# Patient Record
Sex: Female | Born: 1935
Health system: Southern US, Community
[De-identification: ages and names within clinical notes are randomized; demographics above are authoritative.]

## PROBLEM LIST (undated history)

## (undated) DIAGNOSIS — K219 Gastro-esophageal reflux disease without esophagitis: Secondary | ICD-10-CM

## (undated) DIAGNOSIS — M8580 Other specified disorders of bone density and structure, unspecified site: Secondary | ICD-10-CM

## (undated) DIAGNOSIS — G479 Sleep disorder, unspecified: Secondary | ICD-10-CM

## (undated) DIAGNOSIS — E782 Mixed hyperlipidemia: Secondary | ICD-10-CM

## (undated) DIAGNOSIS — I4891 Unspecified atrial fibrillation: Secondary | ICD-10-CM

## (undated) DIAGNOSIS — I480 Paroxysmal atrial fibrillation: Secondary | ICD-10-CM

## (undated) DIAGNOSIS — I5189 Other ill-defined heart diseases: Secondary | ICD-10-CM

## (undated) DIAGNOSIS — E079 Disorder of thyroid, unspecified: Secondary | ICD-10-CM

## (undated) DIAGNOSIS — T7840XA Allergy, unspecified, initial encounter: Secondary | ICD-10-CM

## (undated) DIAGNOSIS — I10 Essential (primary) hypertension: Secondary | ICD-10-CM

## (undated) DIAGNOSIS — E785 Hyperlipidemia, unspecified: Secondary | ICD-10-CM

## (undated) HISTORY — PX: CHOLECYSTECTOMY: SHX55

## (undated) HISTORY — DX: Gastro-esophageal reflux disease without esophagitis: K21.9

## (undated) HISTORY — DX: Allergy, unspecified, initial encounter: T78.40XA

## (undated) HISTORY — DX: Essential (primary) hypertension: I10

## (undated) HISTORY — DX: Hyperlipidemia, unspecified: E78.5

## (undated) HISTORY — DX: Mixed hyperlipidemia: E78.2

## (undated) HISTORY — DX: Other specified disorders of bone density and structure, unspecified site: M85.80

## (undated) HISTORY — DX: Sleep disorder, unspecified: G47.9

## (undated) HISTORY — DX: Disorder of thyroid, unspecified: E07.9

## (undated) HISTORY — DX: Unspecified atrial fibrillation: I48.91

## (undated) HISTORY — DX: Paroxysmal atrial fibrillation: I48.0

## (undated) HISTORY — DX: Other ill-defined heart diseases: I51.89

## (undated) HISTORY — PX: STRABISMUS SURGERY: SHX218

## (undated) HISTORY — PX: CARPAL TUNNEL RELEASE: SHX101

---

## 1959-07-16 HISTORY — PX: CERVICAL CONE BIOPSY: SUR198

## 1999-11-15 HISTORY — PX: NASAL POLYP SURGERY: SHX186

## 2002-01-12 ENCOUNTER — Encounter: Payer: Self-pay | Admitting: Internal Medicine

## 2002-01-12 LAB — CONVERTED CEMR LAB: Pap Smear: NORMAL

## 2002-01-15 ENCOUNTER — Other Ambulatory Visit: Admission: RE | Admit: 2002-01-15 | Discharge: 2002-01-15 | Payer: Self-pay | Admitting: Internal Medicine

## 2002-11-27 ENCOUNTER — Encounter: Payer: Self-pay | Admitting: Internal Medicine

## 2003-04-02 ENCOUNTER — Encounter: Payer: Self-pay | Admitting: Internal Medicine

## 2003-04-02 LAB — HM COLONOSCOPY: HM Colonoscopy: NORMAL

## 2004-10-04 ENCOUNTER — Ambulatory Visit: Payer: Self-pay | Admitting: Internal Medicine

## 2004-10-06 ENCOUNTER — Ambulatory Visit: Payer: Self-pay | Admitting: Internal Medicine

## 2004-10-11 ENCOUNTER — Ambulatory Visit: Payer: Self-pay | Admitting: Internal Medicine

## 2004-10-18 ENCOUNTER — Ambulatory Visit: Payer: Self-pay | Admitting: Internal Medicine

## 2004-10-27 ENCOUNTER — Ambulatory Visit: Payer: Self-pay | Admitting: Internal Medicine

## 2004-11-11 ENCOUNTER — Ambulatory Visit: Payer: Self-pay

## 2004-12-14 ENCOUNTER — Ambulatory Visit: Payer: Self-pay | Admitting: Internal Medicine

## 2005-01-07 ENCOUNTER — Ambulatory Visit: Payer: Self-pay | Admitting: Internal Medicine

## 2005-02-10 ENCOUNTER — Ambulatory Visit: Payer: Self-pay | Admitting: Internal Medicine

## 2005-07-28 ENCOUNTER — Ambulatory Visit: Payer: Self-pay | Admitting: Internal Medicine

## 2005-08-17 ENCOUNTER — Ambulatory Visit: Payer: Self-pay | Admitting: Internal Medicine

## 2005-08-24 ENCOUNTER — Ambulatory Visit: Payer: Self-pay | Admitting: Internal Medicine

## 2005-10-07 ENCOUNTER — Ambulatory Visit: Payer: Self-pay | Admitting: Family Medicine

## 2005-10-24 ENCOUNTER — Ambulatory Visit: Payer: Self-pay | Admitting: Internal Medicine

## 2005-11-23 ENCOUNTER — Ambulatory Visit: Payer: Self-pay | Admitting: Internal Medicine

## 2005-12-21 ENCOUNTER — Ambulatory Visit: Payer: Self-pay | Admitting: Internal Medicine

## 2006-01-16 ENCOUNTER — Ambulatory Visit: Payer: Self-pay | Admitting: Internal Medicine

## 2006-01-25 ENCOUNTER — Ambulatory Visit: Payer: Self-pay | Admitting: Internal Medicine

## 2006-02-24 ENCOUNTER — Ambulatory Visit: Payer: Self-pay | Admitting: General Surgery

## 2006-03-13 ENCOUNTER — Ambulatory Visit: Payer: Self-pay | Admitting: Internal Medicine

## 2006-04-03 ENCOUNTER — Ambulatory Visit: Payer: Self-pay | Admitting: Internal Medicine

## 2006-08-08 ENCOUNTER — Ambulatory Visit: Payer: Self-pay | Admitting: Internal Medicine

## 2006-08-16 ENCOUNTER — Ambulatory Visit: Payer: Self-pay | Admitting: Internal Medicine

## 2006-08-29 ENCOUNTER — Ambulatory Visit: Payer: Self-pay | Admitting: Internal Medicine

## 2006-09-14 ENCOUNTER — Ambulatory Visit: Payer: Self-pay | Admitting: Internal Medicine

## 2006-09-28 ENCOUNTER — Ambulatory Visit: Payer: Self-pay | Admitting: Internal Medicine

## 2006-10-31 ENCOUNTER — Ambulatory Visit: Payer: Self-pay | Admitting: Internal Medicine

## 2006-11-21 ENCOUNTER — Ambulatory Visit: Payer: Self-pay | Admitting: Internal Medicine

## 2006-11-23 ENCOUNTER — Encounter: Payer: Self-pay | Admitting: Internal Medicine

## 2006-12-08 ENCOUNTER — Ambulatory Visit: Payer: Self-pay | Admitting: Internal Medicine

## 2006-12-08 LAB — CONVERTED CEMR LAB: Hgb A1c MFr Bld: 8.1 % — ABNORMAL HIGH (ref 4.6–6.0)

## 2006-12-12 ENCOUNTER — Ambulatory Visit: Payer: Self-pay | Admitting: Internal Medicine

## 2006-12-21 ENCOUNTER — Ambulatory Visit: Payer: Self-pay | Admitting: Internal Medicine

## 2006-12-26 ENCOUNTER — Ambulatory Visit: Payer: Self-pay | Admitting: Internal Medicine

## 2006-12-26 ENCOUNTER — Encounter: Payer: Self-pay | Admitting: Internal Medicine

## 2007-01-12 ENCOUNTER — Encounter: Payer: Self-pay | Admitting: Internal Medicine

## 2007-01-12 DIAGNOSIS — J309 Allergic rhinitis, unspecified: Secondary | ICD-10-CM | POA: Insufficient documentation

## 2007-01-12 DIAGNOSIS — I480 Paroxysmal atrial fibrillation: Secondary | ICD-10-CM

## 2007-01-12 DIAGNOSIS — M949 Disorder of cartilage, unspecified: Secondary | ICD-10-CM

## 2007-01-12 DIAGNOSIS — E039 Hypothyroidism, unspecified: Secondary | ICD-10-CM | POA: Insufficient documentation

## 2007-01-12 DIAGNOSIS — M899 Disorder of bone, unspecified: Secondary | ICD-10-CM | POA: Insufficient documentation

## 2007-01-12 DIAGNOSIS — E782 Mixed hyperlipidemia: Secondary | ICD-10-CM | POA: Insufficient documentation

## 2007-01-12 DIAGNOSIS — K219 Gastro-esophageal reflux disease without esophagitis: Secondary | ICD-10-CM | POA: Insufficient documentation

## 2007-02-19 ENCOUNTER — Ambulatory Visit: Payer: Self-pay | Admitting: Internal Medicine

## 2007-02-23 ENCOUNTER — Ambulatory Visit: Payer: Self-pay | Admitting: Internal Medicine

## 2007-04-18 ENCOUNTER — Encounter (INDEPENDENT_AMBULATORY_CARE_PROVIDER_SITE_OTHER): Payer: Self-pay | Admitting: *Deleted

## 2007-04-19 ENCOUNTER — Ambulatory Visit: Payer: Self-pay | Admitting: Internal Medicine

## 2007-04-20 LAB — CONVERTED CEMR LAB
CO2: 29 meq/L (ref 19–32)
Calcium: 9.7 mg/dL (ref 8.4–10.5)
Chloride: 105 meq/L (ref 96–112)
Cholesterol: 184 mg/dL (ref 0–200)
Creatinine, Ser: 0.7 mg/dL (ref 0.4–1.2)
Free T4: 1 ng/dL (ref 0.6–1.6)
GFR calc Af Amer: 105 mL/min
Glucose, Bld: 187 mg/dL — ABNORMAL HIGH (ref 70–99)
HDL: 39.7 mg/dL (ref >39.0)
Phosphorus: 4.4 mg/dL (ref 2.3–4.6)
Potassium: 4.6 meq/L (ref 3.5–5.1)
Sodium: 140 meq/L (ref 135–145)
TSH: 3.62 microintl units/mL (ref 0.35–5.50)

## 2007-06-08 ENCOUNTER — Telehealth: Payer: Self-pay | Admitting: Internal Medicine

## 2007-07-24 ENCOUNTER — Ambulatory Visit: Payer: Self-pay | Admitting: Internal Medicine

## 2007-08-13 ENCOUNTER — Telehealth (INDEPENDENT_AMBULATORY_CARE_PROVIDER_SITE_OTHER): Payer: Self-pay | Admitting: *Deleted

## 2007-08-13 ENCOUNTER — Emergency Department: Payer: Self-pay | Admitting: Emergency Medicine

## 2007-08-13 ENCOUNTER — Ambulatory Visit: Payer: Self-pay | Admitting: Internal Medicine

## 2007-08-14 ENCOUNTER — Emergency Department: Payer: Self-pay | Admitting: Emergency Medicine

## 2007-08-14 ENCOUNTER — Other Ambulatory Visit: Payer: Self-pay

## 2007-08-16 ENCOUNTER — Ambulatory Visit: Payer: Self-pay | Admitting: Internal Medicine

## 2007-08-21 ENCOUNTER — Encounter: Payer: Self-pay | Admitting: Internal Medicine

## 2007-08-21 ENCOUNTER — Ambulatory Visit: Payer: Self-pay

## 2007-08-22 ENCOUNTER — Ambulatory Visit: Payer: Self-pay | Admitting: Emergency Medicine

## 2007-08-24 ENCOUNTER — Ambulatory Visit: Payer: Self-pay | Admitting: Internal Medicine

## 2007-09-05 ENCOUNTER — Ambulatory Visit: Payer: Self-pay | Admitting: Internal Medicine

## 2007-09-06 LAB — CONVERTED CEMR LAB: Hgb A1c MFr Bld: 7.5 % — ABNORMAL HIGH (ref 4.6–6.0)

## 2007-09-18 ENCOUNTER — Encounter (INDEPENDENT_AMBULATORY_CARE_PROVIDER_SITE_OTHER): Payer: Self-pay | Admitting: *Deleted

## 2007-11-22 ENCOUNTER — Ambulatory Visit: Payer: Self-pay | Admitting: Internal Medicine

## 2007-11-28 ENCOUNTER — Ambulatory Visit: Payer: Self-pay | Admitting: Internal Medicine

## 2007-11-28 LAB — CONVERTED CEMR LAB: Rapid Strep: NEGATIVE

## 2007-12-11 ENCOUNTER — Ambulatory Visit: Payer: Self-pay | Admitting: Internal Medicine

## 2008-01-01 ENCOUNTER — Ambulatory Visit: Payer: Self-pay | Admitting: Internal Medicine

## 2008-01-30 ENCOUNTER — Ambulatory Visit: Payer: Self-pay | Admitting: Internal Medicine

## 2008-01-30 ENCOUNTER — Encounter: Payer: Self-pay | Admitting: Internal Medicine

## 2008-02-04 ENCOUNTER — Ambulatory Visit: Payer: Self-pay | Admitting: Internal Medicine

## 2008-02-06 ENCOUNTER — Ambulatory Visit: Payer: Self-pay | Admitting: Internal Medicine

## 2008-02-06 DIAGNOSIS — I10 Essential (primary) hypertension: Secondary | ICD-10-CM

## 2008-02-07 LAB — CONVERTED CEMR LAB
ALT: 71 units/L — ABNORMAL HIGH (ref 0–35)
Basophils Relative: 1.1 % — ABNORMAL HIGH (ref 0.0–1.0)
Bilirubin, Direct: 0.1 mg/dL (ref 0.0–0.3)
Chloride: 104 meq/L (ref 96–112)
Cholesterol: 173 mg/dL (ref 0–200)
Creatinine, Ser: 0.7 mg/dL (ref 0.4–1.2)
Digitoxin Lvl: 0.4 ng/mL — ABNORMAL LOW (ref 0.8–2.0)
Direct LDL: 82.7 mg/dL
GFR calc Af Amer: 105 mL/min
Glucose, Bld: 189 mg/dL — ABNORMAL HIGH (ref 70–99)
Hgb A1c MFr Bld: 7.9 % — ABNORMAL HIGH (ref 4.6–6.0)
Lymphocytes Relative: 33.3 % (ref 12.0–46.0)
MCHC: 33.8 g/dL (ref 30.0–36.0)
Monocytes Absolute: 0.8 10*3/uL — ABNORMAL HIGH (ref 0.2–0.7)
Neutrophils Relative %: 44.2 % (ref 43.0–77.0)
Potassium: 4.7 meq/L (ref 3.5–5.1)
Sodium: 138 meq/L (ref 135–145)
TSH: 5.88 microintl units/mL — ABNORMAL HIGH (ref 0.35–5.50)
Total CHOL/HDL Ratio: 5.2
Total Protein: 6.4 g/dL (ref 6.0–8.3)
VLDL: 66 mg/dL — ABNORMAL HIGH (ref 0–40)
WBC: 7.3 10*3/uL (ref 4.5–10.5)

## 2008-02-26 ENCOUNTER — Encounter: Payer: Self-pay | Admitting: Internal Medicine

## 2008-03-03 ENCOUNTER — Encounter (INDEPENDENT_AMBULATORY_CARE_PROVIDER_SITE_OTHER): Payer: Self-pay | Admitting: *Deleted

## 2008-03-03 LAB — HM MAMMOGRAPHY: HM Mammogram: NORMAL

## 2008-03-17 ENCOUNTER — Ambulatory Visit: Payer: Self-pay | Admitting: Internal Medicine

## 2008-03-19 LAB — CONVERTED CEMR LAB
ALT: 64 units/L — ABNORMAL HIGH (ref 0–35)
AST: 43 units/L — ABNORMAL HIGH (ref 0–37)
Albumin: 3.8 g/dL (ref 3.5–5.2)
Alkaline Phosphatase: 83 units/L (ref 39–117)
HCV Ab: NEGATIVE

## 2008-06-18 ENCOUNTER — Ambulatory Visit: Payer: Self-pay | Admitting: Internal Medicine

## 2008-06-18 DIAGNOSIS — L570 Actinic keratosis: Secondary | ICD-10-CM | POA: Insufficient documentation

## 2008-06-19 LAB — CONVERTED CEMR LAB
Basophils Absolute: 0.1 10*3/uL (ref 0.0–0.1)
Calcium: 9.4 mg/dL (ref 8.4–10.5)
Chloride: 100 meq/L (ref 96–112)
Direct LDL: 102.8 mg/dL
GFR calc Af Amer: 90 mL/min
GFR calc non Af Amer: 74 mL/min
HCT: 40 % (ref 36.0–46.0)
HDL: 35.8 mg/dL — ABNORMAL LOW (ref >39.0)
Hemoglobin: 14 g/dL (ref 12.0–15.0)
MCHC: 35.1 g/dL (ref 30.0–36.0)
MCV: 90.2 fL (ref 78.0–100.0)
Monocytes Absolute: 0.8 10*3/uL (ref 0.1–1.0)
Monocytes Relative: 11.5 % (ref 3.0–12.0)
Phosphorus: 4.1 mg/dL (ref 2.3–4.6)
Platelets: 206 10*3/uL (ref 150–400)
Potassium: 4.6 meq/L (ref 3.5–5.1)
RBC: 4.43 M/uL (ref 3.87–5.11)
RDW: 11.5 % (ref 11.5–14.6)
Sodium: 135 meq/L (ref 135–145)
Total Bilirubin: 0.8 mg/dL (ref 0.3–1.2)
Total CHOL/HDL Ratio: 5.8
Triglycerides: 430 mg/dL (ref 0–149)
VLDL: 86 mg/dL — ABNORMAL HIGH (ref 0–40)

## 2008-07-16 ENCOUNTER — Ambulatory Visit: Payer: Self-pay | Admitting: Internal Medicine

## 2008-08-07 ENCOUNTER — Ambulatory Visit: Payer: Self-pay | Admitting: Internal Medicine

## 2008-08-14 ENCOUNTER — Encounter: Payer: Self-pay | Admitting: Internal Medicine

## 2008-09-01 ENCOUNTER — Telehealth: Payer: Self-pay | Admitting: Internal Medicine

## 2008-09-03 ENCOUNTER — Encounter: Payer: Self-pay | Admitting: Internal Medicine

## 2008-11-12 ENCOUNTER — Ambulatory Visit: Payer: Self-pay | Admitting: Internal Medicine

## 2008-11-12 DIAGNOSIS — G479 Sleep disorder, unspecified: Secondary | ICD-10-CM | POA: Insufficient documentation

## 2008-11-13 LAB — CONVERTED CEMR LAB: Hgb A1c MFr Bld: 7.5 % — ABNORMAL HIGH (ref 4.6–6.0)

## 2009-02-02 ENCOUNTER — Telehealth: Payer: Self-pay | Admitting: Internal Medicine

## 2009-02-10 ENCOUNTER — Telehealth: Payer: Self-pay | Admitting: Internal Medicine

## 2009-03-16 ENCOUNTER — Ambulatory Visit: Payer: Self-pay | Admitting: Internal Medicine

## 2009-03-17 ENCOUNTER — Ambulatory Visit: Payer: Self-pay | Admitting: Internal Medicine

## 2009-03-17 ENCOUNTER — Encounter: Payer: Self-pay | Admitting: Internal Medicine

## 2009-03-17 LAB — CONVERTED CEMR LAB
BUN: 11 mg/dL (ref 6–23)
Basophils Absolute: 0 10*3/uL (ref 0.0–0.1)
Bilirubin, Direct: 0 mg/dL (ref 0.0–0.3)
CO2: 28 meq/L (ref 19–32)
Calcium: 9.6 mg/dL (ref 8.4–10.5)
Digitoxin Lvl: 0.4 ng/mL — ABNORMAL LOW (ref 0.8–2.0)
Free T4: 1.2 ng/dL (ref 0.6–1.6)
Glucose, Bld: 157 mg/dL — ABNORMAL HIGH (ref 70–99)
Hemoglobin: 14.3 g/dL (ref 12.0–15.0)
Hgb A1c MFr Bld: 8 % — ABNORMAL HIGH (ref 4.6–6.5)
Lymphocytes Relative: 26 % (ref 12.0–46.0)
Lymphs Abs: 1.8 10*3/uL (ref 0.7–4.0)
Microalb, Ur: 3.1 mg/dL — ABNORMAL HIGH (ref 0.0–1.9)
Monocytes Absolute: 0.9 10*3/uL (ref 0.1–1.0)
Neutro Abs: 3.8 10*3/uL (ref 1.4–7.7)
RBC: 4.46 M/uL (ref 3.87–5.11)
TSH: 3.04 microintl units/mL (ref 0.35–5.50)
Total Protein: 7.3 g/dL (ref 6.0–8.3)
Triglycerides: 210 mg/dL — ABNORMAL HIGH (ref 0.0–149.0)
VLDL: 42 mg/dL — ABNORMAL HIGH (ref 0.0–40.0)
WBC: 7 10*3/uL (ref 4.5–10.5)

## 2009-04-23 ENCOUNTER — Ambulatory Visit: Payer: Self-pay | Admitting: Internal Medicine

## 2009-04-23 LAB — CONVERTED CEMR LAB
ALT: 78 units/L — ABNORMAL HIGH (ref 0–35)
AST: 53 units/L — ABNORMAL HIGH (ref 0–37)
Alkaline Phosphatase: 80 units/L (ref 39–117)
Bilirubin, Direct: 0 mg/dL (ref 0.0–0.3)
Total Protein: 7.1 g/dL (ref 6.0–8.3)

## 2009-04-27 ENCOUNTER — Encounter: Payer: Self-pay | Admitting: Internal Medicine

## 2009-05-04 ENCOUNTER — Encounter: Payer: Self-pay | Admitting: Internal Medicine

## 2009-06-09 ENCOUNTER — Ambulatory Visit: Payer: Self-pay | Admitting: Internal Medicine

## 2009-06-23 ENCOUNTER — Telehealth: Payer: Self-pay | Admitting: Family Medicine

## 2009-06-30 ENCOUNTER — Ambulatory Visit: Payer: Self-pay | Admitting: Family Medicine

## 2009-06-30 ENCOUNTER — Telehealth: Payer: Self-pay | Admitting: Family Medicine

## 2009-06-30 DIAGNOSIS — IMO0002 Reserved for concepts with insufficient information to code with codable children: Secondary | ICD-10-CM

## 2009-07-29 ENCOUNTER — Ambulatory Visit: Payer: Self-pay | Admitting: Internal Medicine

## 2009-07-30 LAB — CONVERTED CEMR LAB
ALT: 63 units/L — ABNORMAL HIGH (ref 0–35)
Albumin: 3.9 g/dL (ref 3.5–5.2)
Alkaline Phosphatase: 77 units/L (ref 39–117)
Bilirubin, Direct: 0 mg/dL (ref 0.0–0.3)
CO2: 28 meq/L (ref 19–32)
Chloride: 101 meq/L (ref 96–112)
Creatinine, Ser: 0.8 mg/dL (ref 0.4–1.2)
Digitoxin Lvl: 0.5 ng/mL — ABNORMAL LOW (ref 0.8–2.0)
Glucose, Bld: 146 mg/dL — ABNORMAL HIGH (ref 70–99)
HCT: 40.8 % (ref 36.0–46.0)
Hgb A1c MFr Bld: 7.3 % — ABNORMAL HIGH (ref 4.6–6.5)
Platelets: 231 10*3/uL (ref 150.0–400.0)
RDW: 12.2 % (ref 11.5–14.6)
Total Bilirubin: 0.9 mg/dL (ref 0.3–1.2)

## 2009-07-31 LAB — CONVERTED CEMR LAB
HCV Ab: NEGATIVE
Hep A IgM: NEGATIVE

## 2009-08-06 ENCOUNTER — Telehealth: Payer: Self-pay | Admitting: Internal Medicine

## 2009-08-18 ENCOUNTER — Ambulatory Visit: Payer: Self-pay | Admitting: Internal Medicine

## 2009-08-26 ENCOUNTER — Encounter: Admission: RE | Admit: 2009-08-26 | Discharge: 2009-08-26 | Payer: Self-pay | Admitting: Orthopaedic Surgery

## 2009-12-09 ENCOUNTER — Ambulatory Visit: Payer: Self-pay | Admitting: Internal Medicine

## 2010-01-11 ENCOUNTER — Telehealth: Payer: Self-pay | Admitting: Internal Medicine

## 2010-03-15 LAB — HM DIABETES EYE EXAM

## 2010-03-18 ENCOUNTER — Encounter: Payer: Self-pay | Admitting: Internal Medicine

## 2010-04-05 ENCOUNTER — Ambulatory Visit: Payer: Self-pay | Admitting: Internal Medicine

## 2010-04-08 LAB — CONVERTED CEMR LAB
ALT: 36 units/L — ABNORMAL HIGH (ref 0–35)
AST: 29 units/L (ref 0–37)
Albumin: 4 g/dL (ref 3.5–5.2)
BUN: 22 mg/dL (ref 6–23)
CO2: 28 meq/L (ref 19–32)
Eosinophils Relative: 6.6 % — ABNORMAL HIGH (ref 0.0–5.0)
Free T4: 1.1 ng/dL (ref 0.6–1.6)
GFR calc non Af Amer: 90.67 mL/min (ref >60)
Glucose, Bld: 178 mg/dL — ABNORMAL HIGH (ref 70–99)
HCT: 38.6 % (ref 36.0–46.0)
Hemoglobin: 13.4 g/dL (ref 12.0–15.0)
Lymphs Abs: 2.1 10*3/uL (ref 0.7–4.0)
MCHC: 34.7 g/dL (ref 30.0–36.0)
Monocytes Relative: 9.7 % (ref 3.0–12.0)
Neutro Abs: 3.8 10*3/uL (ref 1.4–7.7)
Neutrophils Relative %: 53.2 % (ref 43.0–77.0)
Phosphorus: 4.3 mg/dL (ref 2.3–4.6)
Platelets: 218 10*3/uL (ref 150.0–400.0)
RBC: 4.24 M/uL (ref 3.87–5.11)
Sodium: 139 meq/L (ref 135–145)
TSH: 2.97 microintl units/mL (ref 0.35–5.50)
WBC: 7.1 10*3/uL (ref 4.5–10.5)

## 2010-05-06 ENCOUNTER — Ambulatory Visit: Payer: Self-pay | Admitting: Internal Medicine

## 2010-07-26 ENCOUNTER — Ambulatory Visit: Payer: Self-pay | Admitting: Internal Medicine

## 2010-07-26 DIAGNOSIS — F39 Unspecified mood [affective] disorder: Secondary | ICD-10-CM

## 2010-08-04 ENCOUNTER — Encounter: Payer: Self-pay | Admitting: Internal Medicine

## 2010-08-05 ENCOUNTER — Encounter: Payer: Self-pay | Admitting: Internal Medicine

## 2010-08-05 ENCOUNTER — Ambulatory Visit: Payer: Self-pay

## 2010-08-09 ENCOUNTER — Ambulatory Visit: Payer: Self-pay | Admitting: Internal Medicine

## 2010-09-17 ENCOUNTER — Ambulatory Visit: Payer: Self-pay | Admitting: Internal Medicine

## 2010-09-30 ENCOUNTER — Ambulatory Visit: Payer: Self-pay | Admitting: Internal Medicine

## 2010-10-06 ENCOUNTER — Telehealth: Payer: Self-pay | Admitting: Family Medicine

## 2010-10-12 ENCOUNTER — Ambulatory Visit: Payer: Self-pay | Admitting: Internal Medicine

## 2010-10-12 DIAGNOSIS — E162 Hypoglycemia, unspecified: Secondary | ICD-10-CM

## 2010-10-13 LAB — CONVERTED CEMR LAB: Hgb A1c MFr Bld: 8.3 % — ABNORMAL HIGH (ref 4.6–6.5)

## 2010-10-29 ENCOUNTER — Ambulatory Visit: Payer: Self-pay | Admitting: Family Medicine

## 2010-11-11 ENCOUNTER — Telehealth: Payer: Self-pay | Admitting: Internal Medicine

## 2010-11-23 ENCOUNTER — Ambulatory Visit
Admission: RE | Admit: 2010-11-23 | Discharge: 2010-11-23 | Payer: Self-pay | Source: Home / Self Care | Attending: Family Medicine | Admitting: Family Medicine

## 2010-11-23 DIAGNOSIS — M545 Low back pain: Secondary | ICD-10-CM | POA: Insufficient documentation

## 2010-12-07 ENCOUNTER — Ambulatory Visit
Admission: RE | Admit: 2010-12-07 | Discharge: 2010-12-07 | Payer: Self-pay | Source: Home / Self Care | Attending: Family Medicine | Admitting: Family Medicine

## 2010-12-14 NOTE — Assessment & Plan Note (Signed)
Summary: 2 m f/u dlo   Vital Signs:  Patient profile:   77 year old female Weight:      173 pounds Temp:     97.9 degrees F oral Pulse rate:   58 / minute Pulse rhythm:   regular BP sitting:   140 / 80  (left arm) Cuff size:   large  Vitals Entered By: Mervin Hack CMA Duncan Dull) (September 17, 2010 12:16 PM) CC: 2 month follow-up   History of Present Illness: Just had visit with her husband and his sagas go on he is constantly complaining at home--esp in the evenings tries to protect her own time--as difficult as it may be  DIL goes out with her for lunch at least once a week She has tried to set limits also--like when he tried intermittent catheterization, she refused to do it for him  She does feel that she is doing some better she has a feeling of empowerment since she has been able to say no to him  Allergies: 1)  Omnicef 2)  * Cipro (Ciprofloxacin) 3)  Riomet (Metformin Hcl) 4)  Glyset (Miglitol) 5)  * Serevent (Salmeterol Xinafoate) 6)  * Niacin 7)  Biaxin (Clarithromycin)  Past History:  Past medical, surgical, family and social histories (including risk factors) reviewed for relevance to current acute and chronic problems.  Past Medical History: Reviewed history from 11/12/2008 and no changes required. Allergic rhinitis-nasal polyps Asthma---osp 1997 Atrial fibrillation--hosp. 1994 Diabetes mellitus, type II GERD Hyperlipidemia Hypothyroidism Osteopenia Sleep disorder  Past Surgical History: Reviewed history from 01/12/2007 and no changes required. Cholecystectomy-1993 Strabismus surgery-1946 & 1967 VDx2 Cervical conization-1960's Nasal polypectomy-1/01  Family History: Reviewed history from 01/12/2007 and no changes required. Mom died of asthma or ?PE--45 Dad died at 70  ?CAD  Social History: Reviewed history from 01/12/2007 and no changes required. Married Never Smoked Alcohol use-yes 2 children   Impression &  Recommendations:  Problem # 1:  ANXIETY, SITUATIONAL (ICD-308.3) Assessment Improved her husband is the same but she is some better with setting boundaries no meds seem to be needed at this time  counselled all of 15 minute visit  Problem # 2:  DIABETES MELLITUS, TYPE II (ICD-250.00) has had some stress so she hasn't been as tight as usual will recheck at follow up Her updated medication list for this problem includes:    Glucotrol Xl 10 Mg Tb24 (Glipizide) .Marland Kitchen... Take 2 tablets daily    Lantus 100 Unit/ml Soln (Insulin glargine) .Marland Kitchen... Take 62  units daily    Aspirin 81 Mg Tbec (Aspirin) .Marland Kitchen... 1 by mouth daily  Labs Reviewed: Creat: 0.7 (04/05/2010)     Last Eye Exam: No diabetic retinopathy.    (03/15/2010) Reviewed HgBA1c results: 7.6 (04/05/2010)  8.2 (12/09/2009)  Complete Medication List: 1)  Levothyroxine Sodium 88 Mcg Tabs (Levothyroxine sodium) .Marland Kitchen.. 1 daily 2)  Flovent Hfa 44 Mcg/act Aero (Fluticasone propionate  hfa) .... As needed 3)  Xanax 0.25 Mg Tabs (Alprazolam) .Marland Kitchen.. 1-2  by mouth two times a day as needed 4)  Lanoxin 0.25 Mg Tabs (Digoxin) .... 1/2 tab daily 5)  Lopressor 50 Mg Tabs (Metoprolol tartrate) .... 1/2 tab daily 6)  Glucotrol Xl 10 Mg Tb24 (Glipizide) .... Take 2 tablets daily 7)  Omeprazole 20 Mg Cpdr (Omeprazole) .... Take 1 tablet by mouth two times a day-must have appt prior to more refills 8)  Antivert 25 Mg Tabs (Meclizine hcl) .Marland Kitchen.. 1 by mouth three times a day as needed 9)  Lantus 100 Unit/ml Soln (Insulin glargine) .... Take 62  units daily 10)  Loratadine 10 Mg Tabs (Loratadine) .Marland Kitchen.. 1-2 by mouth daily as needed 11)  Aspirin 81 Mg Tbec (Aspirin) .Marland Kitchen.. 1 by mouth daily 12)  Folic Acid 1 Mg Tabs (Folic acid) .... Daily 13)  Fish Oil 1000 Mg Caps (Omega-3 fatty acids) .... Daily 14)  Selenium 100 Mcg Tabs (Selenium) .... Daily 15)  Chromium 100 Mcg Tabs (Chromium) .... Daily 16)  Coq-10 Caps (Coenzyme q10 caps) .... Daily 17)  Milk Thistle 500  Mg Caps (Milk thistle) .... Daily 18)  Cranberry Concentrate 100-3 Mg-unit Caps (Vitamins c e) .... 2 by mouth daily 19)  Multivitamins Tabs (Multiple vitamin) .Marland Kitchen.. 1 by mouth daily 20)  Vitamin C 500 Mg Tabs (Ascorbic acid) .... Daily 21)  Vitamin B-12 100 Mcg Tabs (Cyanocobalamin) .... Daily 22)  Citracal Plus Tabs (Multiple minerals-vitamins) .... 2 by mouth daily 23)  Albuterol Sulfate (2.5 Mg/24ml) 0.083% Nebu (Albuterol sulfate) .... Prn 24)  Bd Ultra-fine 33 Lancets Misc (Lancets) .... Test two times a day or as directed 25)  Bd Pen Needle Mini U/f 31g X 5 Mm Misc (Insulin pen needle) .... Use as directed 26)  Onetouch Test Strp (Glucose blood) .... Use as directed 27)  Fluticasone Propionate 50 Mcg/act Susp (Fluticasone propionate) .... 2 sprays in each nostril daily  Patient Instructions: 1)  Please schedule a follow-up appointment in 4 months .    Orders Added: 1)  Est. Patient Level III [16109]    Current Allergies (reviewed today): OMNICEF * CIPRO (CIPROFLOXACIN) RIOMET (METFORMIN HCL) GLYSET (MIGLITOL) * SEREVENT (SALMETEROL XINAFOATE) * NIACIN BIAXIN (CLARITHROMYCIN)

## 2010-12-14 NOTE — Letter (Signed)
Summary: [facility]   Imported By: Lanelle Bal 03/31/2010 11:51:18  _____________________________________________________________________  External Attachment:    Type:   Image     Comment:   External Document  Appended Document: [facility]     Clinical Lists Changes  Observations: Added new observation of DIAB EYE EX: No diabetic retinopathy.    (03/15/2010 20:23)       Diabetic Eye Exam  Procedure date:  03/15/2010  Findings:      No diabetic retinopathy.

## 2010-12-14 NOTE — Assessment & Plan Note (Signed)
Summary: 1:45  CHECK GROWTH ON SKIN/CLE   Vital Signs:  Patient Profile:   77 Years Old Female Weight:      175 pounds Temp:     98.9 degrees F oral Pulse rate:   69 / minute BP sitting:   144 / 76  (left arm) Cuff size:   regular  Vitals Entered By: Cooper Render (August 07, 2008 2:10 PM)                 PCP:  Alphonsus Sias  Chief Complaint:  ? ringworm L) axilla.  History of Present Illness: Noticed a red ring in left axilla a few days ago May have been around for a week Concerned about ringworm  No itching No new soap or softener  hasn't been sick    Current Allergies (reviewed today): OMNICEF (CEFDINIR) * CIPRO (CIPROFLOXACIN) RIOMET (METFORMIN HCL) GLYSET (MIGLITOL) * SEREVENT (SALMETEROL XINAFOATE) * NIACIN BIAXIN (CLARITHROMYCIN)  Past Medical History:    Reviewed history from 01/12/2007 and no changes required:       Allergic rhinitis-nasal polyps       Asthma---osp 1997       Atrial fibrillation--hosp. 1994       Diabetes mellitus, type II       GERD       Hyperlipidemia       Hypothyroidism       Osteopenia  Past Surgical History:    Reviewed history from 01/12/2007 and no changes required:       Cholecystectomy-1993       Strabismus surgery-1946 & 1967       VDx2       Cervical conization-1960's       Nasal polypectomy-1/01          Social History:    Reviewed history from 01/12/2007 and no changes required:       Married       Never Smoked       Alcohol use-yes       2 children    Review of Systems       no med changes no GI symptoms   Physical Exam  General:     alert.  NAD Skin:     3.5cm circular lesion raised and red around the outside with central clearing     Impression & Recommendations:  Problem # 1:  RINGWORM (ICD-110.9) Assessment: New not typical at her age recent antibiotics causing??  wiil try ketoconazole cream  Complete Medication List: 1)  Miacalcin 200 Unit/act Soln (Calcitonin (salmon)) ....  Daily 2)  Lanoxin 0.25 Mg Tabs (Digoxin) .... 1/2 tab daily 3)  Lopressor 50 Mg Tabs (Metoprolol tartrate) .... 1/2 tab daily 4)  Glucotrol Xl 10 Mg Tb24 (Glipizide) .... Take 2 tablets daily 5)  Omeprazole 20 Mg Cpdr (Omeprazole) .... Take 1 tablet by mouth two times a day 6)  Flonase 50 Mcg/act Susp (Fluticasone propionate) .... Daily 7)  Lantus 100 Unit/ml Soln (Insulin glargine) .... 66 units daily 8)  Folic Acid 1 Mg Tabs (Folic acid) .... Daily 9)  Fish Oil 1000 Mg Caps (Omega-3 fatty acids) .... Daily 10)  Selenium 100 Mcg Tabs (Selenium) .... Daily 11)  Chromium 100 Mcg Tabs (Chromium) .... Daily 12)  Coq-10 Caps (Coenzyme q10 caps) .... Daily 13)  Milk Thistle 500 Mg Caps (Milk thistle) .... Daily 14)  Cranberry Concentrate 100-3 Mg-unit Caps (Vitamins c e) .... 2 by mouth daily 15)  Aspirin 81 Mg Tbec (Aspirin) .Marland Kitchen.. 1 by  mouth daily 16)  Multivitamins Tabs (Multiple vitamin) .Marland Kitchen.. 1 by mouth daily 17)  Vitamin C 500 Mg Tabs (Ascorbic acid) .... Daily 18)  Vitamin B-12 100 Mcg Tabs (Cyanocobalamin) .... Daily 19)  Citracal Plus Tabs (Multiple minerals-vitamins) .... 2 by mouth daily 20)  Flovent Hfa 44 Mcg/act Aero (Fluticasone propionate  hfa) .... As needed 21)  Albuterol Sulfate (2.5 Mg/57ml) 0.083% Nebu (Albuterol sulfate) .... Prn 22)  Xanax 0.25 Mg Tabs (Alprazolam) .Marland Kitchen.. 1 by mouth two times a day as needed 23)  Loratadine 10 Mg Tabs (Loratadine) .Marland Kitchen.. 1-2 by mouth daily as needed 24)  Antivert 25 Mg Tabs (Meclizine hcl) .Marland Kitchen.. 1 by mouth three times a day as needed 25)  B-d Uf Mini Pen Needles  .... As dir 26)  Bd Ultra-fine 33 Lancets Misc (Lancets) .... Test two times a day or as directed 27)  One Touch Test Strips  .... As directed 28)  Levothyroxine Sodium 88 Mcg Tabs (Levothyroxine sodium) .Marland Kitchen.. 1 daily 29)  Ketoconazole 2 % Crea (Ketoconazole) .... Apply two times a day to rash till cleared   Patient Instructions: 1)  Please schedule a follow-up appointment as  needed.   Prescriptions: KETOCONAZOLE 2 % CREA (KETOCONAZOLE) apply two times a day to rash till cleared  #1 tube x 1   Entered and Authorized by:   Cindee Salt MD   Signed by:   Cindee Salt MD on 08/07/2008   Method used:   Electronically to        [facility] [address] (retail)       [address], [address]       [place], [place]  [address]       Ph: [phone]       Fax: [phone]   RxID:   [id number]  ] Prior Medications (reviewed today): MIACALCIN 200 UNIT/ACT SOLN (CALCITONIN (SALMON)) daily LANOXIN 0.25 MG TABS (DIGOXIN) 1/2 tab daily LOPRESSOR 50 MG TABS (METOPROLOL TARTRATE) 1/2 tab daily GLUCOTROL XL 10 MG  TB24 (GLIPIZIDE) take 2 tablets daily OMEPRAZOLE 20 MG CPDR (OMEPRAZOLE) Take 1 tablet by mouth two times a day FLONASE 50 MCG/ACT SUSP (FLUTICASONE PROPIONATE) daily LANTUS 100 UNIT/ML SOLN (INSULIN GLARGINE) 66 units daily FOLIC ACID 1 MG TABS (FOLIC ACID) daily FISH OIL 6644 MG CAPS (OMEGA-3 FATTY ACIDS) daily SELENIUM 100 MCG TABS (SELENIUM) daily CHROMIUM 100 MCG TABS (CHROMIUM) daily COQ-10  CAPS (COENZYME Q10 CAPS) daily MILK THISTLE 500 MG CAPS (MILK THISTLE) daily CRANBERRY CONCENTRATE 100-3 MG-UNIT CAPS (VITAMINS C E) 2 by mouth daily ASPIRIN 81 MG TBEC (ASPIRIN) 1 by mouth daily MULTIVITAMINS  TABS (MULTIPLE VITAMIN) 1 by mouth daily VITAMIN C 500 MG TABS (ASCORBIC ACID) daily VITAMIN B-12 100 MCG TABS (CYANOCOBALAMIN) daily CITRACAL PLUS  TABS (MULTIPLE MINERALS-VITAMINS) 2 by mouth daily FLOVENT HFA 44 MCG/ACT AERO (FLUTICASONE PROPIONATE  HFA) as needed ALBUTEROL SULFATE (2.5 MG/3ML) 0.083% NEBU (ALBUTEROL SULFATE) prn XANAX 0.25 MG TABS (ALPRAZOLAM) 1 by mouth two times a day as needed LORATADINE 10 MG TABS (LORATADINE) 1-2 by mouth daily as needed ANTIVERT 25 MG TABS (MECLIZINE HCL) 1 by mouth three times a day as needed B-D UF MINI PEN NEEDLES () as dir BD ULTRA-FINE 33 LANCETS   MISC (LANCETS)  test two times a day or as directed ONE TOUCH TEST STRIPS () as directed LEVOTHYROXINE SODIUM 88 MCG  TABS (LEVOTHYROXINE SODIUM) 1 daily Current Allergies (reviewed today): OMNICEF (CEFDINIR) *  CIPRO (CIPROFLOXACIN) RIOMET (METFORMIN HCL) GLYSET (MIGLITOL) * SEREVENT (SALMETEROL XINAFOATE) * NIACIN BIAXIN (CLARITHROMYCIN)

## 2010-12-14 NOTE — Progress Notes (Signed)
Summary: Low blood sugar  Phone Note Call from Patient Call back at Home Phone [phone]   Caller: Patient Call For: Dr.Tower Summary of Call: LETVAK PATIENT:  pt calling with a BS of 43 this morning, pt then took a glucerna, it went up to 51, 2hrs later took another BS went up to 88. Pt states she increased her lantus to 63units, pt has not eaten any food. Please advise what she can do? Initial call taken by: Mervin Hack CMA Duncan Dull),  October 06, 2010 12:20 PM  Follow-up for Phone Call        verify-- she went up from 62 to 63 units of lantus?  also --? why is she not eating  let me know please Follow-up by: Judith Part MD,  October 06, 2010 12:44 PM  Additional Follow-up for Phone Call Additional follow up Details #1::        Pt is taking Lantus 63 units. Pt raised from 62 to 63 units because she is takinig Prednisone. Pt has poor eating habits, no real reason pt did not eat. Pt just ate a ham sandwich with double cheese and drank chocolate milk. Pt is not as shaky now and is not sweating. Pt still feels weak.Lewanda Rife LPN  October 06, 2010 1:03 PM     Additional Follow-up for Phone Call Additional follow up Details #2::    go back to previous lantus  eat regular meals -- if sugar is low you need to eat ! update [place] on monday  watch closely over weekend for low sugar and have a friend or family member look after you as well Follow-up by: Judith Part MD,  October 06, 2010 1:35 PM  Additional Follow-up for Phone Call Additional follow up Details #3:: Details for Additional Follow-up Action Taken: Patient notified as instructed by telephone. Pt will have family with her.Lewanda Rife LPN  October 06, 2010 3:59 PM

## 2010-12-14 NOTE — Miscellaneous (Signed)
Summary: Orders Update  Clinical Lists Changes  Orders: Added new Test order of Carotid Duplex (Carotid Duplex) - Signed 

## 2010-12-14 NOTE — Assessment & Plan Note (Signed)
Summary: COUGH,CONGESTION/CLE   Vital Signs:  Patient profile:   77 year old female Weight:      166 pounds O2 Sat:      95 % on Room air Temp:     100.4 degrees F oral Pulse rate:   68 / minute Pulse rhythm:   regular Resp:     18 per minute BP sitting:   140 / 80  (left arm) Cuff size:   large  Vitals Entered By: Mervin Hack CMA Duncan Dull) (September 30, 2010 4:50 PM)  O2 Flow:  Room air CC: cough, congestion   History of Present Illness: started with sneezing paroxysms and congestion a couple of nights ago lots of rhinorrhea and some pain in ears lots of cough with secondary sore throat now moving down in bronchial tubes  No clear cut fever but may have been warm this Am Did have slight sweat last evening no chills no sig SOB  Bringing up some mucus--mostly swallowing but some white  Allergies: 1)  Omnicef 2)  * Cipro (Ciprofloxacin) 3)  Riomet (Metformin Hcl) 4)  Glyset (Miglitol) 5)  * Serevent (Salmeterol Xinafoate) 6)  * Niacin 7)  Biaxin (Clarithromycin)  Past History:  Past medical, surgical, family and social histories (including risk factors) reviewed for relevance to current acute and chronic problems.  Past Medical History: Reviewed history from 11/12/2008 and no changes required. Allergic rhinitis-nasal polyps Asthma---osp 1997 Atrial fibrillation--hosp. 1994 Diabetes mellitus, type II GERD Hyperlipidemia Hypothyroidism Osteopenia Sleep disorder  Past Surgical History: Reviewed history from 01/12/2007 and no changes required. Cholecystectomy-1993 Strabismus surgery-1946 & 1967 VDx2 Cervical conization-1960's Nasal polypectomy-1/01  Family History: Reviewed history from 01/12/2007 and no changes required. Mom died of asthma or ?PE--45 Dad died at 23  ?CAD  Social History: Reviewed history from 01/12/2007 and no changes required. Married Never Smoked Alcohol use-yes 2 children  Review of Systems       No nausea or  vomiting no diarrhea appetite off but can eat  Physical Exam  General:  alert.  NAD Head:  no sinus tenderness Ears:  R ear normal and L ear normal.   Nose:  moderate congestion with some thick white mucus Mouth:  no erythema and no exudates.   Neck:  supple, no masses, and no cervical lymphadenopathy.   Lungs:  normal respiratory effort, no intercostal retractions, no accessory muscle use, normal breath sounds, and no crackles.  Mild prolonged exp phase and exp wheezing   Impression & Recommendations:  Problem # 1:  SINUSITIS - ACUTE-NOS (ICD-461.9) Assessment New may well be viral but with the asthma flare, empiric antibiotics are appropriate has done best with augmentin still on the flonase  Her updated medication list for this problem includes:    Fluticasone Propionate 50 Mcg/act Susp (Fluticasone propionate) .Marland Kitchen... 2 sprays in each nostril daily    Amoxicillin-pot Clavulanate 875-125 Mg Tabs (Amoxicillin-pot clavulanate) .Marland Kitchen... 1 tab by mouth two times a day after eating for sinus infection  Problem # 2:  ASTHMA (ICD-493.90) Assessment: Deteriorated mild exacerbation will give short term prednisone burst  Her updated medication list for this problem includes:    Flovent Hfa 44 Mcg/act Aero (Fluticasone propionate  hfa) .Marland Kitchen... As needed    Albuterol Sulfate (2.5 Mg/34ml) 0.083% Nebu (Albuterol sulfate) .Marland Kitchen... Prn    Prednisone 20 Mg Tabs (Prednisone) .Marland Kitchen... 2 tabs daily for 5 days then 1 tab daily for 5 days for asthma flare  Complete Medication List: 1)  Levothyroxine Sodium 88 Mcg Tabs (  Levothyroxine sodium) .Marland Kitchen.. 1 daily 2)  Flovent Hfa 44 Mcg/act Aero (Fluticasone propionate  hfa) .... As needed 3)  Xanax 0.25 Mg Tabs (Alprazolam) .Marland Kitchen.. 1-2  by mouth two times a day as needed 4)  Lanoxin 0.25 Mg Tabs (Digoxin) .... 1/2 tab daily 5)  Lopressor 50 Mg Tabs (Metoprolol tartrate) .... 1/2 tab daily 6)  Glucotrol Xl 10 Mg Tb24 (Glipizide) .... Take 2 tablets daily 7)   Omeprazole 20 Mg Cpdr (Omeprazole) .... Take 1 tablet by mouth two times a day-must have appt prior to more refills 8)  Antivert 25 Mg Tabs (Meclizine hcl) .Marland Kitchen.. 1 by mouth three times a day as needed 9)  Lantus 100 Unit/ml Soln (Insulin glargine) .... Take 62  units daily 10)  Loratadine 10 Mg Tabs (Loratadine) .Marland Kitchen.. 1-2 by mouth daily as needed 11)  Aspirin 81 Mg Tbec (Aspirin) .Marland Kitchen.. 1 by mouth daily 12)  Folic Acid 1 Mg Tabs (Folic acid) .... Daily 13)  Fish Oil 1000 Mg Caps (Omega-3 fatty acids) .... Daily 14)  Selenium 100 Mcg Tabs (Selenium) .... Daily 15)  Chromium 100 Mcg Tabs (Chromium) .... Daily 16)  Coq-10 Caps (Coenzyme q10 caps) .... Daily 17)  Milk Thistle 500 Mg Caps (Milk thistle) .... Daily 18)  Cranberry Concentrate 100-3 Mg-unit Caps (Vitamins c e) .... 2 by mouth daily 19)  Multivitamins Tabs (Multiple vitamin) .Marland Kitchen.. 1 by mouth daily 20)  Vitamin C 500 Mg Tabs (Ascorbic acid) .... Daily 21)  Vitamin B-12 100 Mcg Tabs (Cyanocobalamin) .... Daily 22)  Citracal Plus Tabs (Multiple minerals-vitamins) .... 2 by mouth daily 23)  Albuterol Sulfate (2.5 Mg/85ml) 0.083% Nebu (Albuterol sulfate) .... Prn 24)  Bd Ultra-fine 33 Lancets Misc (Lancets) .... Test two times a day or as directed 25)  Bd Pen Needle Mini U/f 31g X 5 Mm Misc (Insulin pen needle) .... Use as directed 26)  Onetouch Test Strp (Glucose blood) .... Use as directed 27)  Fluticasone Propionate 50 Mcg/act Susp (Fluticasone propionate) .... 2 sprays in each nostril daily 28)  Amoxicillin-pot Clavulanate 875-125 Mg Tabs (Amoxicillin-pot clavulanate) .Marland Kitchen.. 1 tab by mouth two times a day after eating for sinus infection 29)  Prednisone 20 Mg Tabs (Prednisone) .... 2 tabs daily for 5 days then 1 tab daily for 5 days for asthma flare  Patient Instructions: 1)  Call if you are not feeling better by early next week 2)  Please keep your regular appt Prescriptions: PREDNISONE 20 MG TABS (PREDNISONE) 2 tabs daily for 5 days then  1 tab daily for 5 days for asthma flare  #15 x 0   Entered and Authorized by:   Cindee Salt MD   Signed by:   Cindee Salt MD on 09/30/2010   Method used:   Print then Give to Patient   RxID:   [id number] AMOXICILLIN-POT CLAVULANATE 875-125 MG TABS (AMOXICILLIN-POT CLAVULANATE) 1 tab by mouth two times a day after eating for sinus infection  #20 x 0   Entered and Authorized by:   Cindee Salt MD   Signed by:   Cindee Salt MD on 09/30/2010   Method used:   Print then Give to Patient   RxID:   [id number]    Orders Added: 1)  Est. Patient Level IV [44034]    Current Allergies (reviewed today): OMNICEF * CIPRO (CIPROFLOXACIN) RIOMET (METFORMIN HCL) GLYSET (MIGLITOL) * SEREVENT (SALMETEROL XINAFOATE) * NIACIN BIAXIN (CLARITHROMYCIN)  Prevention & Chronic Care Immunizations   Influenza vaccine: Fluvax  MCR  (08/09/2010)    Tetanus booster: Not documented    Pneumococcal vaccine: Not documented    H. zoster vaccine: Not documented  Colorectal Screening   Hemoccult: Not documented    Colonoscopy: Normal  (04/02/2003)  Other Screening   Pap smear: Normal  (01/12/2002)    Mammogram: normal  (03/03/2008)    DXA bone density scan: Not documented   Smoking status: never  (01/12/2007)  Diabetes Mellitus   HgbA1C: 7.6  (04/05/2010)    Eye exam: No diabetic retinopathy.     (03/15/2010)   Eye exam due: 02/2010    Foot exam: yes  (04/05/2010)   High risk foot: Not documented   Foot care education: Not documented    Urine microalbumin/creatinine ratio: 16.6  (03/16/2009)  Lipids   Total Cholesterol: 204  (03/16/2009)   LDL: DEL  (06/18/2008)   LDL Direct: 123.1  (03/16/2009)   HDL: 35.10  (03/16/2009)   Triglycerides: 210.0  (03/16/2009)    SGOT (AST): 29  (04/05/2010)   SGPT (ALT): 36  (04/05/2010)   Alkaline phosphatase: 83  (04/05/2010)   Total bilirubin: 0.8  (04/05/2010)  Hypertension   Last Blood Pressure: 140 / 80   (09/30/2010)   Serum creatinine: 0.7  (04/05/2010)   Serum potassium 4.7  (04/05/2010)  Self-Management Support :    Diabetes self-management support: Not documented    Hypertension self-management support: Not documented    Lipid self-management support: Not documented

## 2010-12-14 NOTE — Progress Notes (Signed)
SummaryPrudy [name]   Phone Note Refill Request Message from:  [id number] on January 11, 2010 10:52 AM  Refills Requested: Medication #1:  XANAX 0.25 MG TABS 1-2  by mouth two times a day as needed   Last Refilled: 04/14/2009 [facility] Request    Method Requested: Telephone to Pharmacy Initial call taken by: Mervin Hack CMA Duncan Dull),  January 11, 2010 10:53 AM  Follow-up for Phone Call        okay #120 x 1 Follow-up by: Cindee Salt MD,  January 11, 2010 1:20 PM  Additional Follow-up for Phone Call Additional follow up Details #1::        Rx called to pharmacy Additional Follow-up by: DeShannon Katrinka Blazing CMA Duncan Dull),  January 11, 2010 2:43 PM    Prescriptions: XANAX 0.25 MG TABS (ALPRAZOLAM) 1-2  by mouth two times a day as needed  #120 x 1   Entered by:   Mervin Hack CMA (AAMA)   Authorized by:   Cindee Salt MD   Signed by:   Mervin Hack CMA (AAMA) on 01/11/2010   Method used:   Telephoned to ...       [facility] (retail)       [address], [address]       [place], [place]  [address]       Ph: [phone]       Fax: [phone]   RxID:   [id number]

## 2010-12-14 NOTE — Assessment & Plan Note (Signed)
Summary: 2:15  FATIGUE,FLU SHOT/CLE   Vital Signs:  Patient profile:   77 year old female Weight:      174 pounds Temp:     98.7 degrees F oral Pulse rate:   64 / minute Pulse rhythm:   regular BP sitting:   132 / 70  (left arm) Cuff size:   large  Vitals Entered By: Sydell Axon LPN (August 09, 2010 2:21 PM) CC: Fatigue and wants flu shot   History of Present Illness: totally fatifued Husband here and he notes this He is clearly fatiguing her with his passive dependence and multiple medical issues Wants to sleep all the time This is the pattern we spoke about at her last visit  has some issues with her head Pattern with "crazy colors" in hemi visual field sensory changes --"like a little electric shock" down eye and cheek "surface sensitivity" Balance feels off some  did have surgery for nasal polyps years ago May have had some nerve damage then but these symptoms are more recent  Not sure if she is depressed Acknowledges that she has given oup her lifestyle to take care of him  Allergies: 1)  Omnicef 2)  * Cipro (Ciprofloxacin) 3)  Riomet (Metformin Hcl) 4)  Glyset (Miglitol) 5)  * Serevent (Salmeterol Xinafoate) 6)  * Niacin 7)  Biaxin (Clarithromycin)  Past History:  Past medical, surgical, family and social histories (including risk factors) reviewed for relevance to current acute and chronic problems.  Past Medical History: Reviewed history from 11/12/2008 and no changes required. Allergic rhinitis-nasal polyps Asthma---osp 1997 Atrial fibrillation--hosp. 1994 Diabetes mellitus, type II GERD Hyperlipidemia Hypothyroidism Osteopenia Sleep disorder  Past Surgical History: Reviewed history from 01/12/2007 and no changes required. Cholecystectomy-1993 Strabismus surgery-1946 & 1967 VDx2 Cervical conization-1960's Nasal polypectomy-1/01  Family History: Reviewed history from 01/12/2007 and no changes required. Mom died of asthma or  ?PE--45 Dad died at 70  ?CAD  Social History: Reviewed history from 01/12/2007 and no changes required. Married Never Smoked Alcohol use-yes 2 children  Physical Exam  General:  alert and normal appearance.   Psych:  normally interactive, good eye contact, and slightly anxious.     Impression & Recommendations:  Problem # 1:  ANXIETY, SITUATIONAL (ICD-308.3) Assessment Unchanged ongoing issues with husband doesn't seem to have true depression--not clear that meds would help discussed trying to create her own life---getting out some, etc  Complete Medication List: 1)  Levothyroxine Sodium 88 Mcg Tabs (Levothyroxine sodium) .Marland Kitchen.. 1 daily 2)  Flovent Hfa 44 Mcg/act Aero (Fluticasone propionate  hfa) .... As needed 3)  Xanax 0.25 Mg Tabs (Alprazolam) .Marland Kitchen.. 1-2  by mouth two times a day as needed 4)  Lanoxin 0.25 Mg Tabs (Digoxin) .... 1/2 tab daily 5)  Lopressor 50 Mg Tabs (Metoprolol tartrate) .... 1/2 tab daily 6)  Glucotrol Xl 10 Mg Tb24 (Glipizide) .... Take 2 tablets daily 7)  Omeprazole 20 Mg Cpdr (Omeprazole) .... Take 1 tablet by mouth two times a day-must have appt prior to more refills 8)  Antivert 25 Mg Tabs (Meclizine hcl) .Marland Kitchen.. 1 by mouth three times a day as needed 9)  Lantus 100 Unit/ml Soln (Insulin glargine) .... Take 62  units daily 10)  Loratadine 10 Mg Tabs (Loratadine) .Marland Kitchen.. 1-2 by mouth daily as needed 11)  Aspirin 81 Mg Tbec (Aspirin) .Marland Kitchen.. 1 by mouth daily 12)  Folic Acid 1 Mg Tabs (Folic acid) .... Daily 13)  Fish Oil 1000 Mg Caps (Omega-3 fatty acids) .... Daily  14)  Selenium 100 Mcg Tabs (Selenium) .... Daily 15)  Chromium 100 Mcg Tabs (Chromium) .... Daily 16)  Coq-10 Caps (Coenzyme q10 caps) .... Daily 17)  Milk Thistle 500 Mg Caps (Milk thistle) .... Daily 18)  Cranberry Concentrate 100-3 Mg-unit Caps (Vitamins c e) .... 2 by mouth daily 19)  Multivitamins Tabs (Multiple vitamin) .Marland Kitchen.. 1 by mouth daily 20)  Vitamin C 500 Mg Tabs (Ascorbic acid) ....  Daily 21)  Vitamin B-12 100 Mcg Tabs (Cyanocobalamin) .... Daily 22)  Citracal Plus Tabs (Multiple minerals-vitamins) .... 2 by mouth daily 23)  Albuterol Sulfate (2.5 Mg/69ml) 0.083% Nebu (Albuterol sulfate) .... Prn 24)  Bd Ultra-fine 33 Lancets Misc (Lancets) .... Test two times a day or as directed 25)  Bd Pen Needle Mini U/f 31g X 5 Mm Misc (Insulin pen needle) .... Use as directed 26)  Onetouch Test Strp (Glucose blood) .... Use as directed 27)  Fluticasone Propionate 50 Mcg/act Susp (Fluticasone propionate) .... 2 sprays in each nostril daily  Patient Instructions: 1)  Please keep the November 4th appt  Current Allergies (reviewed today): OMNICEF * CIPRO (CIPROFLOXACIN) RIOMET (METFORMIN HCL) GLYSET (MIGLITOL) * SEREVENT (SALMETEROL XINAFOATE) * NIACIN BIAXIN (CLARITHROMYCIN)  Appended Document: 2:15  FATIGUE,FLU SHOT/CLE   Influenza Vaccine    Vaccine Type: Fluvax MCR    Site: left deltoid    Mfr: Gl[employer]oSmithK[employer]    Dose: 0.5 ml    Route: IM    Given by: Sydell Axon LPN    Exp. Date: 05/14/2011    Lot #: [id number]    VIS given: 06/08/10 version given August 09, 2010.  Flu Vaccine Consent Questions    Do you have a history of severe allergic reactions to this vaccine? no    Any prior history of allergic reactions to egg and/or gelatin? no    Do you have a sensitivity to the preservative Thimersol? no    Do you have a past history of Guillan-Barre Syndrome? no    Do you currently have an acute febrile illness? no    Have you ever had a severe reaction to latex? no    Vaccine information given and explained to patient? yes    Are you currently pregnant? no

## 2010-12-14 NOTE — Assessment & Plan Note (Signed)
Summary: URI   Vital Signs:  Patient profile:   77 year old female Weight:      175 pounds Temp:     98.1 degrees F oral Resp:     12 per minute BP sitting:   150 / 80  (left arm) Cuff size:   regular  Vitals Entered By: Mervin Hack CMA Duncan Dull) (May 06, 2010 4:43 PM) CC: cough, sneezing   History of Present Illness: Came down with respiratory illness 3 days ago lots of sneezing using zycam and helping some Ears are plugged, sinuses full throat feels plugged as well only occ cough---seems to produce a little mucus in chest  now with some back pain relates to cough---not really pleuritic  no clear fever No night sweats Feels tired No sig SOB  Allergies: 1)  Omnicef 2)  * Cipro (Ciprofloxacin) 3)  Riomet (Metformin Hcl) 4)  Glyset (Miglitol) 5)  * Serevent (Salmeterol Xinafoate) 6)  * Niacin 7)  Biaxin (Clarithromycin)  Past History:  Past medical, surgical, family and social histories (including risk factors) reviewed for relevance to current acute and chronic problems.  Past Medical History: Reviewed history from 11/12/2008 and no changes required. Allergic rhinitis-nasal polyps Asthma---osp 1997 Atrial fibrillation--hosp. 1994 Diabetes mellitus, type II GERD Hyperlipidemia Hypothyroidism Osteopenia Sleep disorder  Past Surgical History: Reviewed history from 01/12/2007 and no changes required. Cholecystectomy-1993 Strabismus surgery-1946 & 1967 VDx2 Cervical conization-1960's Nasal polypectomy-1/01  Family History: Reviewed history from 01/12/2007 and no changes required. Mom died of asthma or ?PE--45 Dad died at 34  ?CAD  Social History: Reviewed history from 01/12/2007 and no changes required. Married Never Smoked Alcohol use-yes 2 children  Review of Systems       No nausea or vomiting appetite is okay  Physical Exam  General:  alert.  NAD Head:  mild maxillary tenderness no frontal tenderness Ears:  R ear normal and L ear  normal.   Nose:  marked congestion and inflammation thick whitish mucus on right Mouth:  no erythema and no exudates.   Neck:  supple, no masses, and no cervical lymphadenopathy.   Lungs:  normal respiratory effort, no intercostal retractions, no accessory muscle use, normal breath sounds, no dullness, no crackles, and no wheezes.     Impression & Recommendations:  Problem # 1:  SINUSITIS - ACUTE-NOS (ICD-461.9) Assessment New seems to be a viral infection at this point zycam has helped some discussed supportive care if symptoms worsen, will start augmentin Rx  The following medications were removed from the medication list:    Flonase 50 Mcg/act Susp (Fluticasone propionate) .Marland Kitchen... Daily Her updated medication list for this problem includes:    Fluticasone Propionate 50 Mcg/act Susp (Fluticasone propionate) .Marland Kitchen... 2 sprays in each nostril daily    Amoxicillin-pot Clavulanate 875-125 Mg Tabs (Amoxicillin-pot clavulanate) .Marland Kitchen... 1 tab by mouth two times a day after eating for sinus infection  Complete Medication List: 1)  Levothyroxine Sodium 88 Mcg Tabs (Levothyroxine sodium) .Marland Kitchen.. 1 daily 2)  Flovent Hfa 44 Mcg/act Aero (Fluticasone propionate  hfa) .... As needed 3)  Xanax 0.25 Mg Tabs (Alprazolam) .Marland Kitchen.. 1-2  by mouth two times a day as needed 4)  Lanoxin 0.25 Mg Tabs (Digoxin) .... 1/2 tab daily 5)  Lopressor 50 Mg Tabs (Metoprolol tartrate) .... 1/2 tab daily 6)  Glucotrol Xl 10 Mg Tb24 (Glipizide) .... Take 2 tablets daily 7)  Omeprazole 20 Mg Cpdr (Omeprazole) .... Take 1 tablet by mouth two times a day 8)  Antivert 25  Mg Tabs (Meclizine hcl) .Marland Kitchen.. 1 by mouth three times a day as needed 9)  Lantus 100 Unit/ml Soln (Insulin glargine) .... Take 62  units daily 10)  Loratadine 10 Mg Tabs (Loratadine) .Marland Kitchen.. 1-2 by mouth daily as needed 11)  Aspirin 81 Mg Tbec (Aspirin) .Marland Kitchen.. 1 by mouth daily 12)  Folic Acid 1 Mg Tabs (Folic acid) .... Daily 13)  Fish Oil 1000 Mg Caps (Omega-3 fatty  acids) .... Daily 14)  Selenium 100 Mcg Tabs (Selenium) .... Daily 15)  Chromium 100 Mcg Tabs (Chromium) .... Daily 16)  Coq-10 Caps (Coenzyme q10 caps) .... Daily 17)  Milk Thistle 500 Mg Caps (Milk thistle) .... Daily 18)  Cranberry Concentrate 100-3 Mg-unit Caps (Vitamins c e) .... 2 by mouth daily 19)  Multivitamins Tabs (Multiple vitamin) .Marland Kitchen.. 1 by mouth daily 20)  Vitamin C 500 Mg Tabs (Ascorbic acid) .... Daily 21)  Vitamin B-12 100 Mcg Tabs (Cyanocobalamin) .... Daily 22)  Citracal Plus Tabs (Multiple minerals-vitamins) .... 2 by mouth daily 23)  Albuterol Sulfate (2.5 Mg/10ml) 0.083% Nebu (Albuterol sulfate) .... Prn 24)  Bd Ultra-fine 33 Lancets Misc (Lancets) .... Test two times a day or as directed 25)  Bd Pen Needle Mini U/f 31g X 5 Mm Misc (Insulin pen needle) .... Use as directed 26)  Onetouch Test Strp (Glucose blood) .... Use as directed 27)  Fluticasone Propionate 50 Mcg/act Susp (Fluticasone propionate) .... 2 sprays in each nostril daily 28)  Amoxicillin-pot Clavulanate 875-125 Mg Tabs (Amoxicillin-pot clavulanate) .Marland Kitchen.. 1 tab by mouth two times a day after eating for sinus infection  Patient Instructions: 1)  Please start the antibiotic if you get worse 2)  Use the flonase at least over the next 2-3 weeks 3)  Keep regular follow up appt Prescriptions: AMOXICILLIN-POT CLAVULANATE 875-125 MG TABS (AMOXICILLIN-POT CLAVULANATE) 1 tab by mouth two times a day after eating for sinus infection  #20 x 0   Entered and Authorized by:   Cindee Salt MD   Signed by:   Cindee Salt MD on 05/06/2010   Method used:   Print then Give to Patient   RxID:   [id number]   Current Allergies (reviewed today): OMNICEF * CIPRO (CIPROFLOXACIN) RIOMET (METFORMIN HCL) GLYSET (MIGLITOL) * SEREVENT (SALMETEROL XINAFOATE) * NIACIN BIAXIN (CLARITHROMYCIN)

## 2010-12-14 NOTE — Assessment & Plan Note (Signed)
Summary: CHECK SUGAR/CLE   Vital Signs:  Patient profile:   77 year old female Weight:      174 pounds Temp:     98.5 degrees F oral BP sitting:   120 / 80  (left arm) Cuff size:   large  Vitals Entered By: Mervin Hack CMA Duncan Dull) (October 12, 2010 12:35 PM) CC: check diabetes   History of Present Illness: had hypoglycemic reaction awoke with soaking sweat in AM-- about 7:30AM felt "jingly" felt weak  see phone note  drank glucerna and had banana  Had reaction the next day at 10:45AM--down in 50's Had to eat something else again  Variable fasting sugars but no unusual to be under 100  Allergies: 1)  Omnicef 2)  * Cipro (Ciprofloxacin) 3)  Riomet (Metformin Hcl) 4)  Glyset (Miglitol) 5)  * Serevent (Salmeterol Xinafoate) 6)  * Niacin 7)  Biaxin (Clarithromycin)  Past History:  Past medical, surgical, family and social histories (including risk factors) reviewed for relevance to current acute and chronic problems.  Past Medical History: Reviewed history from 11/12/2008 and no changes required. Allergic rhinitis-nasal polyps Asthma---osp 1997 Atrial fibrillation--hosp. 1994 Diabetes mellitus, type II GERD Hyperlipidemia Hypothyroidism Osteopenia Sleep disorder  Past Surgical History: Reviewed history from 01/12/2007 and no changes required. Cholecystectomy-1993 Strabismus surgery-1946 & 1967 VDx2 Cervical conization-1960's Nasal polypectomy-1/01  Family History: Reviewed history from 01/12/2007 and no changes required. Mom died of asthma or ?PE--45 Dad died at 35  ?CAD  Social History: Reviewed history from 01/12/2007 and no changes required. Married Never Smoked Alcohol use-yes 2 children  Review of Systems       weight is down a few pounds from September Not a big appetite and can be irregular with meals   Impression & Recommendations:  Problem # 1:  HYPOGLYCEMIA (ICD-251.2) Assessment New gnerally good control may be reactive  after recent prednisone treatment reviewed meds---will cut the glipizide to 10mg  daily counselled all of 15 minute visit will recheck A1c as well  Complete Medication List: 1)  Levothyroxine Sodium 88 Mcg Tabs (Levothyroxine sodium) .Marland Kitchen.. 1 daily 2)  Flovent Hfa 44 Mcg/act Aero (Fluticasone propionate  hfa) .... As needed 3)  Xanax 0.25 Mg Tabs (Alprazolam) .Marland Kitchen.. 1-2  by mouth two times a day as needed 4)  Lanoxin 0.25 Mg Tabs (Digoxin) .... 1/2 tab daily 5)  Lopressor 50 Mg Tabs (Metoprolol tartrate) .... 1/2 tab daily 6)  Glucotrol Xl 10 Mg Tb24 (Glipizide) .... Take 1 tab daily before breakfast or lunch 7)  Omeprazole 20 Mg Cpdr (Omeprazole) .... Take 1 tablet by mouth two times a day- 8)  Antivert 25 Mg Tabs (Meclizine hcl) .Marland Kitchen.. 1 by mouth three times a day as needed 9)  Lantus 100 Unit/ml Soln (Insulin glargine) .... Take 62  units daily 10)  Loratadine 10 Mg Tabs (Loratadine) .Marland Kitchen.. 1-2 by mouth daily as needed 11)  Aspirin 81 Mg Tbec (Aspirin) .Marland Kitchen.. 1 by mouth daily 12)  Folic Acid 1 Mg Tabs (Folic acid) .... Daily 13)  Fish Oil 1000 Mg Caps (Omega-3 fatty acids) .... Daily 14)  Selenium 100 Mcg Tabs (Selenium) .... Daily 15)  Chromium 100 Mcg Tabs (Chromium) .... Daily 16)  Coq-10 Caps (Coenzyme q10 caps) .... Daily 17)  Milk Thistle 500 Mg Caps (Milk thistle) .... Daily 18)  Cranberry Concentrate 100-3 Mg-unit Caps (Vitamins c e) .... 2 by mouth daily 19)  Multivitamins Tabs (Multiple vitamin) .Marland Kitchen.. 1 by mouth daily 20)  Vitamin C 500 Mg Tabs (  Ascorbic acid) .... Daily 21)  Vitamin B-12 100 Mcg Tabs (Cyanocobalamin) .... Daily 22)  Citracal Plus Tabs (Multiple minerals-vitamins) .... 2 by mouth daily 23)  Albuterol Sulfate (2.5 Mg/64ml) 0.083% Nebu (Albuterol sulfate) .... Prn 24)  Bd Ultra-fine 33 Lancets Misc (Lancets) .... Test two times a day or as directed 25)  Bd Pen Needle Mini U/f 31g X 5 Mm Misc (Insulin pen needle) .... Use as directed 26)  Onetouch Test Strp (Glucose  blood) .... Use as directed 27)  Fluticasone Propionate 50 Mcg/act Susp (Fluticasone propionate) .... 2 sprays in each nostril daily  Other Orders: Venipuncture (16109) TLB-A1C / Hgb A1C (Glycohemoglobin) (83036-A1C)  Patient Instructions: 1)  Please keep February appt 2)  Please cut the glipizide to 10mg  daily 3)  Call if you have more low sugar reactions   Orders Added: 1)  Est. Patient Level III [60454] 2)  Venipuncture [36415] 3)  TLB-A1C / Hgb A1C (Glycohemoglobin) [83036-A1C]    Current Allergies (reviewed today): OMNICEF * CIPRO (CIPROFLOXACIN) RIOMET (METFORMIN HCL) GLYSET (MIGLITOL) * SEREVENT (SALMETEROL XINAFOATE) * NIACIN BIAXIN (CLARITHROMYCIN)

## 2010-12-14 NOTE — Assessment & Plan Note (Signed)
Summary: 4 MONTH F/U,TIRED ALL THE TIME/RBH   Vital Signs:  Patient profile:   77 year old female Height:      64 inches Weight:      181 pounds BMI:     31.18 Temp:     98.2 degrees F oral Pulse rate:   60 / minute Pulse rhythm:   regular BP sitting:   120 / 88  (left arm) Cuff size:   regular  Vitals Entered By: Mervin Hack CMA Duncan Dull) (July 26, 2010 12:49 PM) CC: 4 month follow up   History of Present Illness: Feels very tired all the time Tends to stay up late (1-2AM) and likes to sleep in to noon or so sometimes needs to nap after meals though Has always needed at least 10 hours per night of sleep  She also feels it is some of a way for her to "get away from reality" Husband's ongoing medical problems are tough He is hard to take care of---- "he is a real weight" to take care of needs constant information and attention constantly asking questions  Now she has to do all the household tasks as well due to his disability She has to do all his cath care also  Allergies: 1)  Omnicef 2)  * Cipro (Ciprofloxacin) 3)  Riomet (Metformin Hcl) 4)  Glyset (Miglitol) 5)  * Serevent (Salmeterol Xinafoate) 6)  * Niacin 7)  Biaxin (Clarithromycin)  Past History:  Past medical, surgical, family and social histories (including risk factors) reviewed for relevance to current acute and chronic problems.  Past Medical History: Reviewed history from 11/12/2008 and no changes required. Allergic rhinitis-nasal polyps Asthma---osp 1997 Atrial fibrillation--hosp. 1994 Diabetes mellitus, type II GERD Hyperlipidemia Hypothyroidism Osteopenia Sleep disorder  Past Surgical History: Reviewed history from 01/12/2007 and no changes required. Cholecystectomy-1993 Strabismus surgery-1946 & 1967 VDx2 Cervical conization-1960's Nasal polypectomy-1/01  Family History: Reviewed history from 01/12/2007 and no changes required. Mom died of asthma or ?PE--45 Dad died at 41   ?CAD  Social History: Reviewed history from 01/12/2007 and no changes required. Married Never Smoked Alcohol use-yes 2 children  Review of Systems       has mild pain from pinched nerve in back Noted left visual field blurriness after quickly getting up from bending over--may have lasted 5 minutes recent eye exam fine  Physical Exam  General:  alert.  NAD Psych:  dysphoric affect and slightly anxious.     Impression & Recommendations:  Problem # 1:  ANXIETY, SITUATIONAL (ICD-308.3) Assessment New counselled over half of 25 minute visit difficult situation with husband very needy and she can't get away She would benefit from respite but not clear how it can be arranged  Problem # 2:  VISUAL CHANGES (ICD-368.10) Assessment: New  had brief change in left visual field after standing up quickly will just check carotids  Orders: Doppler Referral (Doppler)  Complete Medication List: 1)  Levothyroxine Sodium 88 Mcg Tabs (Levothyroxine sodium) .Marland Kitchen.. 1 daily 2)  Flovent Hfa 44 Mcg/act Aero (Fluticasone propionate  hfa) .... As needed 3)  Xanax 0.25 Mg Tabs (Alprazolam) .Marland Kitchen.. 1-2  by mouth two times a day as needed 4)  Lanoxin 0.25 Mg Tabs (Digoxin) .... 1/2 tab daily 5)  Lopressor 50 Mg Tabs (Metoprolol tartrate) .... 1/2 tab daily 6)  Glucotrol Xl 10 Mg Tb24 (Glipizide) .... Take 2 tablets daily 7)  Omeprazole 20 Mg Cpdr (Omeprazole) .... Take 1 tablet by mouth two times a day-must have appt prior  to more refills 8)  Antivert 25 Mg Tabs (Meclizine hcl) .Marland Kitchen.. 1 by mouth three times a day as needed 9)  Lantus 100 Unit/ml Soln (Insulin glargine) .... Take 62  units daily 10)  Loratadine 10 Mg Tabs (Loratadine) .Marland Kitchen.. 1-2 by mouth daily as needed 11)  Aspirin 81 Mg Tbec (Aspirin) .Marland Kitchen.. 1 by mouth daily 12)  Folic Acid 1 Mg Tabs (Folic acid) .... Daily 13)  Fish Oil 1000 Mg Caps (Omega-3 fatty acids) .... Daily 14)  Selenium 100 Mcg Tabs (Selenium) .... Daily 15)  Chromium 100 Mcg  Tabs (Chromium) .... Daily 16)  Coq-10 Caps (Coenzyme q10 caps) .... Daily 17)  Milk Thistle 500 Mg Caps (Milk thistle) .... Daily 18)  Cranberry Concentrate 100-3 Mg-unit Caps (Vitamins c e) .... 2 by mouth daily 19)  Multivitamins Tabs (Multiple vitamin) .Marland Kitchen.. 1 by mouth daily 20)  Vitamin C 500 Mg Tabs (Ascorbic acid) .... Daily 21)  Vitamin B-12 100 Mcg Tabs (Cyanocobalamin) .... Daily 22)  Citracal Plus Tabs (Multiple minerals-vitamins) .... 2 by mouth daily 23)  Albuterol Sulfate (2.5 Mg/38ml) 0.083% Nebu (Albuterol sulfate) .... Prn 24)  Bd Ultra-fine 33 Lancets Misc (Lancets) .... Test two times a day or as directed 25)  Bd Pen Needle Mini U/f 31g X 5 Mm Misc (Insulin pen needle) .... Use as directed 26)  Onetouch Test Strp (Glucose blood) .... Use as directed 27)  Fluticasone Propionate 50 Mcg/act Susp (Fluticasone propionate) .... 2 sprays in each nostril daily  Patient Instructions: 1)  Please schedule a follow-up appointment in 1-2 months 2)  Please set up carotid test  Current Allergies (reviewed today): OMNICEF * CIPRO (CIPROFLOXACIN) RIOMET (METFORMIN HCL) GLYSET (MIGLITOL) * SEREVENT (SALMETEROL XINAFOATE) * NIACIN BIAXIN (CLARITHROMYCIN)

## 2010-12-14 NOTE — Assessment & Plan Note (Signed)
Summary: ROA 4 MTHS CYD   Vital Signs:  Patient profile:   77 year old female Weight:      175 pounds Temp:     98.3 degrees F oral Pulse rate:   68 / minute Pulse rhythm:   regular BP sitting:   128 / 80  (left arm) Cuff size:   regular  Vitals Entered By: Mervin Hack CMA Duncan Dull) (December 09, 2009 10:42 AM) CC: 4 month   History of Present Illness: doing okay  frustrated by weight gain Up 4#  still having back pain seemed to twist it and brief exacerbation Better now and able to walk again  Diabetes variable Checks fasting in AM Generally under 150 Often under 120-130 No hypoglycemic reactions lately  Occ gets a "gallump" feeling in chest No sig racing Rare heartburn--on prilosec daily No sig chest pain Breathing okay  Allergies: 1)  Omnicef 2)  * Cipro (Ciprofloxacin) 3)  Riomet (Metformin Hcl) 4)  Glyset (Miglitol) 5)  * Serevent (Salmeterol Xinafoate) 6)  * Niacin 7)  Biaxin (Clarithromycin)  Past History:  Past medical, surgical, family and social histories (including risk factors) reviewed for relevance to current acute and chronic problems.  Past Medical History: Reviewed history from 11/12/2008 and no changes required. Allergic rhinitis-nasal polyps Asthma---osp 1997 Atrial fibrillation--hosp. 1994 Diabetes mellitus, type II GERD Hyperlipidemia Hypothyroidism Osteopenia Sleep disorder  Past Surgical History: Reviewed history from 01/12/2007 and no changes required. Cholecystectomy-1993 Strabismus surgery-1946 & 1967 VDx2 Cervical conization-1960's Nasal polypectomy-1/01  Family History: Reviewed history from 01/12/2007 and no changes required. Mom died of asthma or ?PE--45 Dad died at 57  ?CAD  Social History: Reviewed history from 01/12/2007 and no changes required. Married Never Smoked Alcohol use-yes 2 children  Review of Systems       generally sleeps okay Not depressed Lots of stress and anxiousness around  Christmas--she can't do all the things she used to and husband can't help  Physical Exam  General:  alert and normal appearance.   Neck:  supple, no masses, no thyromegaly, no carotid bruits, and no cervical lymphadenopathy.   Lungs:  normal respiratory effort, normal breath sounds, no crackles, and no wheezes.   Heart:  normal rate, regular rhythm, no murmur, and no gallop.   Msk:  no joint tenderness and no joint swelling.   Pulses:  faint in each foot Extremities:  no sig edema Skin:  no suspicious lesions and no ulcerations.   Psych:  normally interactive, good eye contact, not anxious appearing, and not depressed appearing.    Diabetes Management Exam:    Foot Exam (with socks and/or shoes not present):       Sensory-Pinprick/Light touch:          Left medial foot (L-4): normal          Left dorsal foot (L-5): normal          Left lateral foot (S-1): normal          Right medial foot (L-4): normal          Right dorsal foot (L-5): normal          Right lateral foot (S-1): normal       Inspection:          Left foot: normal          Right foot: normal       Nails:          Left foot: normal  Right foot: normal   Impression & Recommendations:  Problem # 1:  DIABETES MELLITUS, TYPE II (ICD-250.00) Assessment Comment Only  seems to have acceptable control still will check A1c  Her updated medication list for this problem includes:    Glucotrol Xl 10 Mg Tb24 (Glipizide) .Marland Kitchen... Take 2 tablets daily    Lantus 100 Unit/ml Soln (Insulin glargine) .Marland Kitchen... Take 62  units daily    Aspirin 81 Mg Tbec (Aspirin) .Marland Kitchen... 1 by mouth daily  Labs Reviewed: Creat: 0.8 (07/29/2009)     Last Eye Exam: No retinopathy (02/12/2009) Reviewed HgBA1c results: 7.3 (07/29/2009)  8.0 (03/16/2009)  Orders: Venipuncture (65784) TLB-A1C / Hgb A1C (Glycohemoglobin) (83036-A1C)  Problem # 2:  BACK PAIN, LUMBAR, WITH RADICULOPATHY (ICD-724.4) Assessment: Improved needs to continue with  regular walking  The following medications were removed from the medication list:    Hydrocodone-acetaminophen 5-325 Mg Tabs (Hydrocodone-acetaminophen) .Marland Kitchen... 1-2 tabs at bedtime as needed for severe back pain Her updated medication list for this problem includes:    Aspirin 81 Mg Tbec (Aspirin) .Marland Kitchen... 1 by mouth daily  Problem # 3:  HYPERTENSION (ICD-401.9) Assessment: Unchanged control is good no changes needed  Her updated medication list for this problem includes:    Lopressor 50 Mg Tabs (Metoprolol tartrate) .Marland Kitchen... 1/2 tab daily  BP today: 128/80 Prior BP: 122/80 (07/29/2009)  Labs Reviewed: K+: 4.8 (07/29/2009) Creat: : 0.8 (07/29/2009)   Chol: 204 (03/16/2009)   HDL: 35.10 (03/16/2009)   LDL: DEL (06/18/2008)   TG: 210.0 (03/16/2009)  Problem # 4:  ATRIAL FIBRILLATION (ICD-427.31) Assessment: Unchanged still regular rhythm Not clear that the lanoxin is maintaining sinus, but will continue  Her updated medication list for this problem includes:    Lanoxin 0.25 Mg Tabs (Digoxin) .Marland Kitchen... 1/2 tab daily    Lopressor 50 Mg Tabs (Metoprolol tartrate) .Marland Kitchen... 1/2 tab daily    Aspirin 81 Mg Tbec (Aspirin) .Marland Kitchen... 1 by mouth daily  Complete Medication List: 1)  Lanoxin 0.25 Mg Tabs (Digoxin) .... 1/2 tab daily 2)  Lopressor 50 Mg Tabs (Metoprolol tartrate) .... 1/2 tab daily 3)  Glucotrol Xl 10 Mg Tb24 (Glipizide) .... Take 2 tablets daily 4)  Omeprazole 20 Mg Cpdr (Omeprazole) .... Take 1 tablet by mouth two times a day 5)  Flonase 50 Mcg/act Susp (Fluticasone propionate) .... Daily 6)  Lantus 100 Unit/ml Soln (Insulin glargine) .... Take 62  units daily 7)  Folic Acid 1 Mg Tabs (Folic acid) .... Daily 8)  Fish Oil 1000 Mg Caps (Omega-3 fatty acids) .... Daily 9)  Selenium 100 Mcg Tabs (Selenium) .... Daily 10)  Chromium 100 Mcg Tabs (Chromium) .... Daily 11)  Coq-10 Caps (Coenzyme q10 caps) .... Daily 12)  Milk Thistle 500 Mg Caps (Milk thistle) .... Daily 13)  Cranberry  Concentrate 100-3 Mg-unit Caps (Vitamins c e) .... 2 by mouth daily 14)  Aspirin 81 Mg Tbec (Aspirin) .Marland Kitchen.. 1 by mouth daily 15)  Multivitamins Tabs (Multiple vitamin) .Marland Kitchen.. 1 by mouth daily 16)  Vitamin C 500 Mg Tabs (Ascorbic acid) .... Daily 17)  Vitamin B-12 100 Mcg Tabs (Cyanocobalamin) .... Daily 18)  Citracal Plus Tabs (Multiple minerals-vitamins) .... 2 by mouth daily 19)  Flovent Hfa 44 Mcg/act Aero (Fluticasone propionate  hfa) .... As needed 20)  Albuterol Sulfate (2.5 Mg/52ml) 0.083% Nebu (Albuterol sulfate) .... Prn 21)  Xanax 0.25 Mg Tabs (Alprazolam) .Marland Kitchen.. 1-2  by mouth two times a day as needed 22)  Loratadine 10 Mg Tabs (Loratadine) .Marland Kitchen.. 1-2 by  mouth daily as needed 23)  Antivert 25 Mg Tabs (Meclizine hcl) .Marland Kitchen.. 1 by mouth three times a day as needed 24)  Bd Ultra-fine 33 Lancets Misc (Lancets) .... Test two times a day or as directed 25)  One Touch Test Strips  .... As directed 26)  Levothyroxine Sodium 88 Mcg Tabs (Levothyroxine sodium) .Marland Kitchen.. 1 daily  Patient Instructions: 1)  Please schedule a follow-up appointment in 4 months .   Current Allergies (reviewed today): OMNICEF * CIPRO (CIPROFLOXACIN) RIOMET (METFORMIN HCL) GLYSET (MIGLITOL) * SEREVENT (SALMETEROL XINAFOATE) * NIACIN BIAXIN (CLARITHROMYCIN)

## 2010-12-14 NOTE — Assessment & Plan Note (Signed)
Summary: 4 MONTH FOLLOW UP/[facility]   Vital Signs:  Patient profile:   77 year old female Weight:      173 pounds BMI:     29.80 Temp:     98.8 degrees F oral Pulse rate:   68 / minute Pulse rhythm:   regular BP sitting:   128 / 70  (left arm) Cuff size:   regular  Vitals Entered By: Mervin Hack CMA Duncan Dull) (Apr 05, 2010 2:53 PM) CC: 4 month follow-up   History of Present Illness: Has been overwhelmed by husband's  multiple problems  diabetes seems to be better Checks once a day 81-149 Mostly under 120 No sig hypoglycemic spells  No heart trouble gets sensation in chest that she attributes to reflux--doesn't have other symptoms though unless she eats fatty meal NO SOB or breathing problems---seems to be better than it was  Occ problems with nerves seems better  Allergies: 1)  Omnicef 2)  * Cipro (Ciprofloxacin) 3)  Riomet (Metformin Hcl) 4)  Glyset (Miglitol) 5)  * Serevent (Salmeterol Xinafoate) 6)  * Niacin 7)  Biaxin (Clarithromycin)  Past History:  Past medical, surgical, family and social histories (including risk factors) reviewed for relevance to current acute and chronic problems.  Past Medical History: Reviewed history from 11/12/2008 and no changes required. Allergic rhinitis-nasal polyps Asthma---osp 1997 Atrial fibrillation--hosp. 1994 Diabetes mellitus, type II GERD Hyperlipidemia Hypothyroidism Osteopenia Sleep disorder  Past Surgical History: Reviewed history from 01/12/2007 and no changes required. Cholecystectomy-1993 Strabismus surgery-1946 & 1967 VDx2 Cervical conization-1960's Nasal polypectomy-1/01  Family History: Reviewed history from 01/12/2007 and no changes required. Mom died of asthma or ?PE--45 Dad died at 53  ?CAD  Social History: Reviewed history from 01/12/2007 and no changes required. Married Never Smoked Alcohol use-yes 2 children  Review of Systems       Generally sleeps okay---usually takes 1/2 xanax to  help weight down 2# since last visit Funny feeling at tip of left great toe--no visible abnormality  Physical Exam  General:  alert and normal appearance.   Neck:  supple, no masses, no thyromegaly, no carotid bruits, and no cervical lymphadenopathy.   Lungs:  normal respiratory effort and normal breath sounds.   Heart:  normal rate, regular rhythm, no murmur, and no gallop.   Abdomen:  soft and non-tender.   Pulses:  1+ in feet Extremities:  no edema Skin:  no suspicious lesions and no ulcerations.   Psych:  normally interactive, good eye contact, not anxious appearing, and not depressed appearing.    Diabetes Management Exam:    Foot Exam (with socks and/or shoes not present):       Sensory-Pinprick/Light touch:          Left medial foot (L-4): normal          Left dorsal foot (L-5): normal          Left lateral foot (S-1): normal          Right medial foot (L-4): normal          Right dorsal foot (L-5): normal          Right lateral foot (S-1): normal       Inspection:          Left foot: normal          Right foot: normal       Nails:          Left foot: normal  Right foot: normal   Impression & Recommendations:  Problem # 1:  DIABETES MELLITUS, TYPE II (ICD-250.00) Assessment Improved  seems to be better goal is under 8% but may not be approp to increase meds unless over 9% may have early neuropathy  Her updated medication list for this problem includes:    Glucotrol Xl 10 Mg Tb24 (Glipizide) .Marland Kitchen... Take 2 tablets daily    Lantus 100 Unit/ml Soln (Insulin glargine) .Marland Kitchen... Take 62  units daily    Aspirin 81 Mg Tbec (Aspirin) .Marland Kitchen... 1 by mouth daily  Labs Reviewed: Creat: 0.8 (07/29/2009)     Last Eye Exam: No diabetic retinopathy.    (03/15/2010) Reviewed HgBA1c results: 8.2 (12/09/2009)  7.3 (07/29/2009)  Orders: TLB-A1C / Hgb A1C (Glycohemoglobin) (83036-A1C)  Problem # 2:  HYPERTENSION (ICD-401.9) Assessment: Unchanged  control is fine due for  labs  Her updated medication list for this problem includes:    Lopressor 50 Mg Tabs (Metoprolol tartrate) .Marland Kitchen... 1/2 tab daily  BP today: 128/70 Prior BP: 128/80 (12/09/2009)  Labs Reviewed: K+: 4.8 (07/29/2009) Creat: : 0.8 (07/29/2009)   Chol: 204 (03/16/2009)   HDL: 35.10 (03/16/2009)   LDL: DEL (06/18/2008)   TG: 210.0 (03/16/2009)  Orders: TLB-Renal Function Panel (80069-RENAL) TLB-CBC Platelet - w/Differential (85025-CBCD) TLB-Hepatic/Liver Function Pnl (80076-HEPATIC)  Problem # 3:  GERD (ICD-530.81) Assessment: Unchanged occ symptoms but generally controlled  Her updated medication list for this problem includes:    Omeprazole 20 Mg Cpdr (Omeprazole) .Marland Kitchen... Take 1 tablet by mouth two times a day  Problem # 4:  ATRIAL FIBRILLATION (ICD-427.31) Assessment: Unchanged no apparent relapse on meds  Her updated medication list for this problem includes:    Lanoxin 0.25 Mg Tabs (Digoxin) .Marland Kitchen... 1/2 tab daily    Lopressor 50 Mg Tabs (Metoprolol tartrate) .Marland Kitchen... 1/2 tab daily    Aspirin 81 Mg Tbec (Aspirin) .Marland Kitchen... 1 by mouth daily  Problem # 5:  HYPOTHYROIDISM (ICD-244.9) Assessment: Unchanged  due for labs  Her updated medication list for this problem includes:    Levothyroxine Sodium 88 Mcg Tabs (Levothyroxine sodium) .Marland Kitchen... 1 daily  Labs Reviewed: TSH: 3.04 (03/16/2009)    HgBA1c: 8.2 (12/09/2009) Chol: 204 (03/16/2009)   HDL: 35.10 (03/16/2009)   LDL: DEL (06/18/2008)   TG: 210.0 (03/16/2009)  Orders: TLB-TSH (Thyroid Stimulating Hormone) (84443-TSH) Venipuncture (57846) TLB-T4 (Thyrox), Free (96295-MW4X)  Problem # 6:  ASTHMA (ICD-493.90) Assessment: Unchanged quiet on Rx  Her updated medication list for this problem includes:    Flovent Hfa 44 Mcg/act Aero (Fluticasone propionate  hfa) .Marland Kitchen... As needed    Albuterol Sulfate (2.5 Mg/61ml) 0.083% Nebu (Albuterol sulfate) .Marland Kitchen... Prn  Complete Medication List: 1)  Levothyroxine Sodium 88 Mcg Tabs (Levothyroxine  sodium) .Marland Kitchen.. 1 daily 2)  Flovent Hfa 44 Mcg/act Aero (Fluticasone propionate  hfa) .... As needed 3)  Xanax 0.25 Mg Tabs (Alprazolam) .Marland Kitchen.. 1-2  by mouth two times a day as needed 4)  Lanoxin 0.25 Mg Tabs (Digoxin) .... 1/2 tab daily 5)  Lopressor 50 Mg Tabs (Metoprolol tartrate) .... 1/2 tab daily 6)  Glucotrol Xl 10 Mg Tb24 (Glipizide) .... Take 2 tablets daily 7)  Omeprazole 20 Mg Cpdr (Omeprazole) .... Take 1 tablet by mouth two times a day 8)  Antivert 25 Mg Tabs (Meclizine hcl) .Marland Kitchen.. 1 by mouth three times a day as needed 9)  Flonase 50 Mcg/act Susp (Fluticasone propionate) .... Daily 10)  Lantus 100 Unit/ml Soln (Insulin glargine) .... Take 62  units daily 11)  Loratadine  10 Mg Tabs (Loratadine) .Marland Kitchen.. 1-2 by mouth daily as needed 12)  Aspirin 81 Mg Tbec (Aspirin) .Marland Kitchen.. 1 by mouth daily 13)  Folic Acid 1 Mg Tabs (Folic acid) .... Daily 14)  Fish Oil 1000 Mg Caps (Omega-3 fatty acids) .... Daily 15)  Selenium 100 Mcg Tabs (Selenium) .... Daily 16)  Chromium 100 Mcg Tabs (Chromium) .... Daily 17)  Coq-10 Caps (Coenzyme q10 caps) .... Daily 18)  Milk Thistle 500 Mg Caps (Milk thistle) .... Daily 19)  Cranberry Concentrate 100-3 Mg-unit Caps (Vitamins c e) .... 2 by mouth daily 20)  Multivitamins Tabs (Multiple vitamin) .Marland Kitchen.. 1 by mouth daily 21)  Vitamin C 500 Mg Tabs (Ascorbic acid) .... Daily 22)  Vitamin B-12 100 Mcg Tabs (Cyanocobalamin) .... Daily 23)  Citracal Plus Tabs (Multiple minerals-vitamins) .... 2 by mouth daily 24)  Albuterol Sulfate (2.5 Mg/59ml) 0.083% Nebu (Albuterol sulfate) .... Prn 25)  Bd Ultra-fine 33 Lancets Misc (Lancets) .... Test two times a day or as directed 26)  Bd Pen Needle Mini U/f 31g X 5 Mm Misc (Insulin pen needle) .... Use as directed 27)  Onetouch Test Strp (Glucose blood) .... Use as directed  Patient Instructions: 1)  Please schedule a follow-up appointment in 4 months .   Current Allergies (reviewed today): OMNICEF * CIPRO (CIPROFLOXACIN) RIOMET  (METFORMIN HCL) GLYSET (MIGLITOL) * SEREVENT (SALMETEROL XINAFOATE) * NIACIN BIAXIN (CLARITHROMYCIN)

## 2010-12-16 NOTE — Progress Notes (Signed)
Summary: fell last night  Phone Note Call from Patient   Caller: Patient Call For: Hannah Salt MD Summary of Call: Pt states she took a fall last night, fell flat on her back, but thinks she is alright.  She is having some low back pain but not bad.  She doesnt think anything needs to be done about this today but said she will call back tomorrow if pain is worse.  She just wanted you  to be aware. Initial call taken by: Lowella Petties CMA, AAMA,  November 11, 2010 12:18 PM  Follow-up for Phone Call        Noted I can add her on tomorrow as needed before I leave town Follow-up by: Hannah Salt MD,  November 11, 2010 12:43 PM

## 2010-12-16 NOTE — Assessment & Plan Note (Signed)
Summary: FELL, HURT BACK   Vital Signs:  Patient profile:   77 year old female Height:      64 inches Weight:      174.0 pounds BMI:     29.97 Temp:     97.8 degrees F oral Pulse rate:   72 / minute Pulse rhythm:   regular BP sitting:   140 / 80  (left arm) Cuff size:   large  Vitals Entered By: Benny Lennert CMA [facility] Dull) (November 23, 2010 11:24 AM)  History of Present Illness: Chief complaint fell and hurt back 10 days ago  77 year old female who fell 10 days ago...she fell in bathroom when giveing herself a shot standing on a stool.  Fell backward..hit central back on tile floor. Head did not hit the ground... landed on soft chair.  Since then  superficial pain on rib cage in front as well in low back B. more on left than right. Pain is mild.but constant. No numbness, no weakness in legs.  No fever.  Using advil for pain as needed.  Self diagnosed history of 'pinched nerve" 6 months ago... intermittant symtpoms on left buttock/hip.      Problems Prior to Update: 1)  Low Back Pain, Acute  (ICD-724.2) 2)  Hypoglycemia  (ICD-251.2) 3)  Anxiety, Situational  (ICD-308.3) 4)  Back Pain, Lumbar, With Radiculopathy  (ICD-724.4) 5)  Sleep Disorder  (ICD-780.50) 6)  Actinic Keratosis  (ICD-702.0) 7)  Hypertension  (ICD-401.9) 8)  Other Screening Mammogram  (ICD-V76.12) 9)  Preventive Health Care  (ICD-V70.0) 10)  Osteopenia  (ICD-733.90) 11)  Hypothyroidism  (ICD-244.9) 12)  Hyperlipidemia  (ICD-272.4) 13)  Gerd  (ICD-530.81) 14)  Diabetes Mellitus, Type II  (ICD-250.00) 15)  Atrial Fibrillation  (ICD-427.31) 16)  Asthma  (ICD-493.90) 17)  Allergic Rhinitis  (ICD-477.9)  Current Medications (verified): 1)  Levothyroxine Sodium 88 Mcg  Tabs (Levothyroxine Sodium) .Marland Kitchen.. 1 Daily 2)  Flovent Hfa 44 Mcg/act Aero (Fluticasone Propionate  Hfa) .... As Needed 3)  Xanax 0.25 Mg Tabs (Alprazolam) .Marland Kitchen.. 1-2  By Mouth Two Times A Day As Needed 4)  Lanoxin 0.25 Mg Tabs (Digoxin)  .... 1/2 Tab Daily 5)  Lopressor 50 Mg Tabs (Metoprolol Tartrate) .... 1/2 Tab Daily 6)  Glucotrol Xl 10 Mg  Tb24 (Glipizide) .... Take 1 Tab Daily Before Breakfast or Lunch 7)  Omeprazole 20 Mg Cpdr (Omeprazole) .... Take 1 Tablet By Mouth Two Times A Day- 8)  Antivert 25 Mg Tabs (Meclizine Hcl) .Marland Kitchen.. 1 By Mouth Three Times A Day As Needed 9)  Lantus 100 Unit/ml Soln (Insulin Glargine) .... Take 62  Units Daily 10)  Loratadine 10 Mg Tabs (Loratadine) .Marland Kitchen.. 1-2 By Mouth Daily As Needed 11)  Aspirin 81 Mg Tbec (Aspirin) .Marland Kitchen.. 1 By Mouth Daily 12)  Folic Acid 1 Mg Tabs (Folic Acid) .... Daily 13)  Fish Oil 1000 Mg Caps (Omega-3 Fatty Acids) .... Daily 14)  Selenium 100 Mcg Tabs (Selenium) .... Daily 15)  Chromium 100 Mcg Tabs (Chromium) .... Daily 16)  Coq-10  Caps (Coenzyme Q10 Caps) .... Daily 17)  Milk Thistle 500 Mg Caps (Milk Thistle) .... Daily 18)  Cranberry Concentrate 100-3 Mg-Unit Caps (Vitamins C E) .... 2 By Mouth Daily 19)  Multivitamins  Tabs (Multiple Vitamin) .Marland Kitchen.. 1 By Mouth Daily 20)  Vitamin C 500 Mg Tabs (Ascorbic Acid) .... Daily 21)  Vitamin B-12 100 Mcg Tabs (Cyanocobalamin) .... Daily 22)  Citracal Plus  Tabs (Multiple Minerals-Vitamins) .... 2 By Mouth  Daily 23)  Albuterol Sulfate (2.5 Mg/64ml) 0.083% Nebu (Albuterol Sulfate) .... Prn 24)  Bd Ultra-Fine 33 Lancets   Misc (Lancets) .... Test Two Times A Day or As Directed 25)  Bd Pen Needle Mini U/f 31g X 5 Mm Misc (Insulin Pen Needle) .... Use As Directed 26)  Onetouch Test  Strp (Glucose Blood) .... Use As Directed 27)  Fluticasone Propionate 50 Mcg/act Susp (Fluticasone Propionate) .... 2 Sprays in Each Nostril Daily  Allergies: 1)  Omnicef 2)  * Cipro (Ciprofloxacin) 3)  Riomet (Metformin Hcl) 4)  Glyset (Miglitol) 5)  * Serevent (Salmeterol Xinafoate) 6)  * Niacin 7)  Biaxin (Clarithromycin)  Past History:  Past medical, surgical, family and social histories (including risk factors) reviewed, and no changes  noted (except as noted below).  Past Medical History: Reviewed history from 11/12/2008 and no changes required. Allergic rhinitis-nasal polyps Asthma---osp 1997 Atrial fibrillation--hosp. 1994 Diabetes mellitus, type II GERD Hyperlipidemia Hypothyroidism Osteopenia Sleep disorder  Past Surgical History: Reviewed history from 01/12/2007 and no changes required. Cholecystectomy-1993 Strabismus surgery-1946 & 1967 VDx2 Cervical conization-1960's Nasal polypectomy-1/01  Family History: Reviewed history from 01/12/2007 and no changes required. Mom died of asthma or ?PE--45 Dad died at 82  ?CAD  Social History: Reviewed history from 01/12/2007 and no changes required. Married Never Smoked Alcohol use-yes 2 children  Review of Systems General:  Denies fatigue and fever. CV:  Denies chest pain or discomfort. Resp:  Denies shortness of breath.  Physical Exam  General:  overweight female, elderly in NAd  Mouth:  Oral mucosa and oropharynx without lesions or exudates.  Teeth in good repair. Lungs:  Normal respiratory effort, chest expands symmetrically. Lungs are clear to auscultation, no crackles or wheezes. Heart:  Normal rate and regular rhythm. S1 and S2 normal without gallop, murmur, click, rub or other extra sounds. Abdomen:  Bowel sounds positive,abdomen soft and non-tender without masses, organomegaly or hernias noted.  tender to palpation over lower rib cage.. diffuse nonfocal Msk:  ttp over L3-4 and left paraspinous muscles  neg  B Faber's, neg SLR athough causes some left buttock  tenderness. Pulses:  R and L posterior tibial pulses are full and equal bilaterally  Extremities:  no edema Skin:  Intact without suspicious lesions or rashes   Impression & Recommendations:  Problem # 1:  LOW BACK PAIN, ACUTE (ICD-724.2) Assessment New Will check X-ray given age, fall and osteopenia...showed no celar evidence of fracture/compression fracture. Will send to  radiologist for review. Will treat with NSAIDs, heat and close follow up in 2 weeks.  Gentle stretching exercises.  Her updated medication list for this problem includes:    Aspirin 81 Mg Tbec (Aspirin) .Marland Kitchen... 1 by mouth daily    Meloxicam 15 Mg Tabs (Meloxicam) .Marland Kitchen... 1 tab by mouth daily  Orders: T-Lumbar Spine Complete, 5 Views (71110TC)  Complete Medication List: 1)  Levothyroxine Sodium 88 Mcg Tabs (Levothyroxine sodium) .Marland Kitchen.. 1 daily 2)  Flovent Hfa 44 Mcg/act Aero (Fluticasone propionate  hfa) .... As needed 3)  Xanax 0.25 Mg Tabs (Alprazolam) .Marland Kitchen.. 1-2  by mouth two times a day as needed 4)  Lanoxin 0.25 Mg Tabs (Digoxin) .... 1/2 tab daily 5)  Lopressor 50 Mg Tabs (Metoprolol tartrate) .... 1/2 tab daily 6)  Glucotrol Xl 10 Mg Tb24 (Glipizide) .... Take 1 tab daily before breakfast or lunch 7)  Omeprazole 20 Mg Cpdr (Omeprazole) .... Take 1 tablet by mouth two times a day- 8)  Antivert 25 Mg Tabs (Meclizine hcl) .Marland KitchenMarland KitchenMarland Kitchen  1 by mouth three times a day as needed 9)  Lantus 100 Unit/ml Soln (Insulin glargine) .... Take 62  units daily 10)  Loratadine 10 Mg Tabs (Loratadine) .Marland Kitchen.. 1-2 by mouth daily as needed 11)  Aspirin 81 Mg Tbec (Aspirin) .Marland Kitchen.. 1 by mouth daily 12)  Folic Acid 1 Mg Tabs (Folic acid) .... Daily 13)  Fish Oil 1000 Mg Caps (Omega-3 fatty acids) .... Daily 14)  Selenium 100 Mcg Tabs (Selenium) .... Daily 15)  Chromium 100 Mcg Tabs (Chromium) .... Daily 16)  Coq-10 Caps (Coenzyme q10 caps) .... Daily 17)  Milk Thistle 500 Mg Caps (Milk thistle) .... Daily 18)  Cranberry Concentrate 100-3 Mg-unit Caps (Vitamins c e) .... 2 by mouth daily 19)  Multivitamins Tabs (Multiple vitamin) .Marland Kitchen.. 1 by mouth daily 20)  Vitamin C 500 Mg Tabs (Ascorbic acid) .... Daily 21)  Vitamin B-12 100 Mcg Tabs (Cyanocobalamin) .... Daily 22)  Citracal Plus Tabs (Multiple minerals-vitamins) .... 2 by mouth daily 23)  Albuterol Sulfate (2.5 Mg/32ml) 0.083% Nebu (Albuterol sulfate) .... Prn 24)  Bd  Ultra-fine 33 Lancets Misc (Lancets) .... Test two times a day or as directed 25)  Bd Pen Needle Mini U/f 31g X 5 Mm Misc (Insulin pen needle) .... Use as directed 26)  Onetouch Test Strp (Glucose blood) .... Use as directed 27)  Fluticasone Propionate 50 Mcg/act Susp (Fluticasone propionate) .... 2 sprays in each nostril daily 28)  Meloxicam 15 Mg Tabs (Meloxicam) .Marland Kitchen.. 1 tab by mouth daily  Patient Instructions: 1)  Low back pain exercises. Heat on low back. 2)  Start meloxicam daily for pain. Do not uses OTC advil. 3)   Follow up appt in 2 weeks.  Prescriptions: MELOXICAM 15 MG TABS (MELOXICAM) 1 tab by mouth daily  #20 x 0   Entered and Authorized by:   Kerby Nora MD   Signed by:   Kerby Nora MD on 11/23/2010   Method used:   Faxed to ...       [facility] (retail)       [address]       [place], [place]    [place]       Ph: [phone]       Fax: [phone]   RxID:   [id number]    Orders Added: 1)  T-Lumbar Spine Complete, 5 Views [71110TC] 2)  Est. Patient Level III [53664]    Current Allergies (reviewed today): OMNICEF * CIPRO (CIPROFLOXACIN) RIOMET (METFORMIN HCL) GLYSET (MIGLITOL) * SEREVENT (SALMETEROL XINAFOATE) * NIACIN BIAXIN (CLARITHROMYCIN)

## 2010-12-16 NOTE — Assessment & Plan Note (Signed)
Summary: 2WK FOLLOW UP / LFW   Vital Signs:  Patient profile:   77 year old female Height:      64 inches Weight:      174.50 pounds BMI:     30.06 Temp:     97.9 degrees F oral Pulse rate:   72 / minute Pulse rhythm:   regular BP sitting:   130 / 70  (left arm) Cuff size:   large  Vitals Entered By: Benny Lennert CMA [facility] Dull) (December 07, 2010 11:13 AM)  History of Present Illness: Chief complaint 2 week follow up back pain   for 77 year old female who fell  11/14/2009...she fell in bathroom when giveing herself a shot standing on a stool. Fell backward..hit central back on tile floor. Head did not hit the ground... landed on soft chair. SEEN for OV 1/10 X-rays showed no fracture. treated with meloxicam, stretching and heat.   Here today for 2 week follow up: Only took meloxiam for few days .. gave her palpitations. Pain has improved a lot with advil and heat.  Doing daily stretching. No new numbness, no weakness or new falls.   Problems Prior to Update: 1)  Low Back Pain, Acute  (ICD-724.2) 2)  Hypoglycemia  (ICD-251.2) 3)  Anxiety, Situational  (ICD-308.3) 4)  Back Pain, Lumbar, With Radiculopathy  (ICD-724.4) 5)  Sleep Disorder  (ICD-780.50) 6)  Actinic Keratosis  (ICD-702.0) 7)  Hypertension  (ICD-401.9) 8)  Other Screening Mammogram  (ICD-V76.12) 9)  Preventive Health Care  (ICD-V70.0) 10)  Osteopenia  (ICD-733.90) 11)  Hypothyroidism  (ICD-244.9) 12)  Hyperlipidemia  (ICD-272.4) 13)  Gerd  (ICD-530.81) 14)  Diabetes Mellitus, Type II  (ICD-250.00) 15)  Atrial Fibrillation  (ICD-427.31) 16)  Asthma  (ICD-493.90) 17)  Allergic Rhinitis  (ICD-477.9)  Current Medications (verified): 1)  Levothyroxine Sodium 88 Mcg  Tabs (Levothyroxine Sodium) .Marland Kitchen.. 1 Daily 2)  Flovent Hfa 44 Mcg/act Aero (Fluticasone Propionate  Hfa) .... As Needed 3)  Xanax 0.25 Mg Tabs (Alprazolam) .Marland Kitchen.. 1-2  By Mouth Two Times A Day As Needed 4)  Lanoxin 0.25 Mg Tabs (Digoxin) .... 1/2 Tab  Daily 5)  Lopressor 50 Mg Tabs (Metoprolol Tartrate) .... 1/2 Tab Daily 6)  Glucotrol Xl 10 Mg  Tb24 (Glipizide) .... Take 1 Tab Daily Before Breakfast or Lunch 7)  Omeprazole 20 Mg Cpdr (Omeprazole) .... Take 1 Tablet By Mouth Two Times A Day- 8)  Antivert 25 Mg Tabs (Meclizine Hcl) .Marland Kitchen.. 1 By Mouth Three Times A Day As Needed 9)  Lantus 100 Unit/ml Soln (Insulin Glargine) .... Take 62  Units Daily 10)  Loratadine 10 Mg Tabs (Loratadine) .Marland Kitchen.. 1-2 By Mouth Daily As Needed 11)  Aspirin 81 Mg Tbec (Aspirin) .Marland Kitchen.. 1 By Mouth Daily 12)  Folic Acid 1 Mg Tabs (Folic Acid) .... Daily 13)  Fish Oil 1000 Mg Caps (Omega-3 Fatty Acids) .... Daily 14)  Selenium 100 Mcg Tabs (Selenium) .... Daily 15)  Chromium 100 Mcg Tabs (Chromium) .... Daily 16)  Coq-10  Caps (Coenzyme Q10 Caps) .... Daily 17)  Milk Thistle 500 Mg Caps (Milk Thistle) .... Daily 18)  Cranberry Concentrate 100-3 Mg-Unit Caps (Vitamins C E) .... 2 By Mouth Daily 19)  Multivitamins  Tabs (Multiple Vitamin) .Marland Kitchen.. 1 By Mouth Daily 20)  Vitamin C 500 Mg Tabs (Ascorbic Acid) .... Daily 21)  Vitamin B-12 100 Mcg Tabs (Cyanocobalamin) .... Daily 22)  Citracal Plus  Tabs (Multiple Minerals-Vitamins) .... 2 By Mouth Daily 23)  Albuterol Sulfate (  2.5 Mg/37ml) 0.083% Nebu (Albuterol Sulfate) .... Prn 24)  Bd Ultra-Fine 33 Lancets   Misc (Lancets) .... Test Two Times A Day or As Directed 25)  Bd Pen Needle Mini U/f 31g X 5 Mm Misc (Insulin Pen Needle) .... Use As Directed 26)  Onetouch Test  Strp (Glucose Blood) .... Use As Directed 27)  Fluticasone Propionate 50 Mcg/act Susp (Fluticasone Propionate) .... 2 Sprays in Each Nostril Daily  Allergies: 1)  Omnicef 2)  * Cipro (Ciprofloxacin) 3)  Riomet (Metformin Hcl) 4)  Glyset (Miglitol) 5)  * Serevent (Salmeterol Xinafoate) 6)  * Niacin 7)  Biaxin (Clarithromycin)  Past History:  Past medical, surgical, family and social histories (including risk factors) reviewed, and no changes noted (except  as noted below).  Past Medical History: Reviewed history from 11/12/2008 and no changes required. Allergic rhinitis-nasal polyps Asthma---osp 1997 Atrial fibrillation--hosp. 1994 Diabetes mellitus, type II GERD Hyperlipidemia Hypothyroidism Osteopenia Sleep disorder  Past Surgical History: Reviewed history from 01/12/2007 and no changes required. Cholecystectomy-1993 Strabismus surgery-1946 & 1967 VDx2 Cervical conization-1960's Nasal polypectomy-1/01  Family History: Reviewed history from 01/12/2007 and no changes required. Mom died of asthma or ?PE--45 Dad died at 50  ?CAD  Social History: Reviewed history from 01/12/2007 and no changes required. Married Never Smoked Alcohol use-yes 2 children  Review of Systems General:  Denies fatigue and fever. CV:  Denies chest pain or discomfort. Resp:  Denies shortness of breath.  Physical Exam  General:  overweight female, elderly in NAd  Mouth:  MMM Lungs:  Normal respiratory effort, chest expands symmetrically. Lungs are clear to auscultation, no crackles or wheezes. Heart:  Normal rate and regular rhythm. S1 and S2 normal without gallop, murmur, click, rub or other extra sounds. Msk:  mild ttp over low back, ttp over left lateral hip.. no gluteal pain any longer neg SLR,  Neurologic:  No cranial nerve deficits noted. Station and gait are normal.DTRs are symmetrical throughout. Sensory, motor and coordinative functions appear intact.   Impression & Recommendations:  Problem # 1:  LOW BACK PAIN, ACUTE (ICD-724.2) . Improved with conservative treatment. Continue stretching, heat and NSAIDs on limited basis. Call if new symtpoms.   Complete Medication List: 1)  Levothyroxine Sodium 88 Mcg Tabs (Levothyroxine sodium) .Marland Kitchen.. 1 daily 2)  Flovent Hfa 44 Mcg/act Aero (Fluticasone propionate  hfa) .... As needed 3)  Xanax 0.25 Mg Tabs (Alprazolam) .Marland Kitchen.. 1-2  by mouth two times a day as needed 4)  Lanoxin 0.25 Mg Tabs  (Digoxin) .... 1/2 tab daily 5)  Lopressor 50 Mg Tabs (Metoprolol tartrate) .... 1/2 tab daily 6)  Glucotrol Xl 10 Mg Tb24 (Glipizide) .... Take 1 tab daily before breakfast or lunch 7)  Omeprazole 20 Mg Cpdr (Omeprazole) .... Take 1 tablet by mouth two times a day- 8)  Antivert 25 Mg Tabs (Meclizine hcl) .Marland Kitchen.. 1 by mouth three times a day as needed 9)  Lantus 100 Unit/ml Soln (Insulin glargine) .... Take 62  units daily 10)  Loratadine 10 Mg Tabs (Loratadine) .Marland Kitchen.. 1-2 by mouth daily as needed 11)  Aspirin 81 Mg Tbec (Aspirin) .Marland Kitchen.. 1 by mouth daily 12)  Folic Acid 1 Mg Tabs (Folic acid) .... Daily 13)  Fish Oil 1000 Mg Caps (Omega-3 fatty acids) .... Daily 14)  Selenium 100 Mcg Tabs (Selenium) .... Daily 15)  Chromium 100 Mcg Tabs (Chromium) .... Daily 16)  Coq-10 Caps (Coenzyme q10 caps) .... Daily 17)  Milk Thistle 500 Mg Caps (Milk thistle) .... Daily 18)  Cranberry Concentrate 100-3 Mg-unit Caps (Vitamins c e) .... 2 by mouth daily 19)  Multivitamins Tabs (Multiple vitamin) .Marland Kitchen.. 1 by mouth daily 20)  Vitamin C 500 Mg Tabs (Ascorbic acid) .... Daily 21)  Vitamin B-12 100 Mcg Tabs (Cyanocobalamin) .... Daily 22)  Citracal Plus Tabs (Multiple minerals-vitamins) .... 2 by mouth daily 23)  Albuterol Sulfate (2.5 Mg/1ml) 0.083% Nebu (Albuterol sulfate) .... Prn 24)  Bd Ultra-fine 33 Lancets Misc (Lancets) .... Test two times a day or as directed 25)  Bd Pen Needle Mini U/f 31g X 5 Mm Misc (Insulin pen needle) .... Use as directed 26)  Onetouch Test Strp (Glucose blood) .... Use as directed 27)  Fluticasone Propionate 50 Mcg/act Susp (Fluticasone propionate) .... 2 sprays in each nostril daily  Patient Instructions: 1)  Call if pain is not continuing to improve with heat and stretching. 2)   Use advil as needed, but don't overuse. 3)  Follow up with primary MD as previously scheduled.    Orders Added: 1)  Est. Patient Level II [16109]    Current Allergies (reviewed today): OMNICEF *  CIPRO (CIPROFLOXACIN) RIOMET (METFORMIN HCL) GLYSET (MIGLITOL) * SEREVENT (SALMETEROL XINAFOATE) * NIACIN BIAXIN (CLARITHROMYCIN)

## 2010-12-16 NOTE — Assessment & Plan Note (Signed)
Summary: RASH AROUND NECK/ 3:00   Vital Signs:  Patient profile:   77 year old female Height:      64 inches Weight:      174.50 pounds BMI:     30.06 Temp:     98.3 degrees F oral Pulse rate:   72 / minute Pulse rhythm:   regular BP sitting:   110 / 60  (left arm) Cuff size:   large  Vitals Entered By: Benny Lennert CMA Duncan Dull) (October 29, 2010 3:13 PM)  History of Present Illness: Chief complaint rash around neck  77 year old female with DM presents with erythematous rash on anterior neck. 2-3 days ago felt ill, nodes tender in neck, nasal congestion, sneeze... symtoms resolved in 24 hours  but then rash appeared. Very itchy rash.  No blisters... no rash in mputh or hands/feet. Tooka benadryl.. no help.   No fever, no SOB, no tounge swelling.  Rash to cipro in past..similar to this.   Only new med was OTC meclizine in last 4-5 days.    Problems Prior to Update: 1)  Hypoglycemia  (ICD-251.2) 2)  Anxiety, Situational  (ICD-308.3) 3)  Back Pain, Lumbar, With Radiculopathy  (ICD-724.4) 4)  Sacroiliitis, Right  (ICD-720.2) 5)  Sleep Disorder  (ICD-780.50) 6)  Actinic Keratosis  (ICD-702.0) 7)  Hypertension  (ICD-401.9) 8)  Other Screening Mammogram  (ICD-V76.12) 9)  Preventive Health Care  (ICD-V70.0) 10)  Osteopenia  (ICD-733.90) 11)  Hypothyroidism  (ICD-244.9) 12)  Hyperlipidemia  (ICD-272.4) 13)  Gerd  (ICD-530.81) 14)  Diabetes Mellitus, Type II  (ICD-250.00) 15)  Atrial Fibrillation  (ICD-427.31) 16)  Asthma  (ICD-493.90) 17)  Allergic Rhinitis  (ICD-477.9)  Current Medications (verified): 1)  Levothyroxine Sodium 88 Mcg  Tabs (Levothyroxine Sodium) .Marland Kitchen.. 1 Daily 2)  Flovent Hfa 44 Mcg/act Aero (Fluticasone Propionate  Hfa) .... As Needed 3)  Xanax 0.25 Mg Tabs (Alprazolam) .Marland Kitchen.. 1-2  By Mouth Two Times A Day As Needed 4)  Lanoxin 0.25 Mg Tabs (Digoxin) .... 1/2 Tab Daily 5)  Lopressor 50 Mg Tabs (Metoprolol Tartrate) .... 1/2 Tab Daily 6)  Glucotrol Xl  10 Mg  Tb24 (Glipizide) .... Take 1 Tab Daily Before Breakfast or Lunch 7)  Omeprazole 20 Mg Cpdr (Omeprazole) .... Take 1 Tablet By Mouth Two Times A Day- 8)  Antivert 25 Mg Tabs (Meclizine Hcl) .Marland Kitchen.. 1 By Mouth Three Times A Day As Needed 9)  Lantus 100 Unit/ml Soln (Insulin Glargine) .... Take 62  Units Daily 10)  Loratadine 10 Mg Tabs (Loratadine) .Marland Kitchen.. 1-2 By Mouth Daily As Needed 11)  Aspirin 81 Mg Tbec (Aspirin) .Marland Kitchen.. 1 By Mouth Daily 12)  Folic Acid 1 Mg Tabs (Folic Acid) .... Daily 13)  Fish Oil 1000 Mg Caps (Omega-3 Fatty Acids) .... Daily 14)  Selenium 100 Mcg Tabs (Selenium) .... Daily 15)  Chromium 100 Mcg Tabs (Chromium) .... Daily 16)  Coq-10  Caps (Coenzyme Q10 Caps) .... Daily 17)  Milk Thistle 500 Mg Caps (Milk Thistle) .... Daily 18)  Cranberry Concentrate 100-3 Mg-Unit Caps (Vitamins C E) .... 2 By Mouth Daily 19)  Multivitamins  Tabs (Multiple Vitamin) .Marland Kitchen.. 1 By Mouth Daily 20)  Vitamin C 500 Mg Tabs (Ascorbic Acid) .... Daily 21)  Vitamin B-12 100 Mcg Tabs (Cyanocobalamin) .... Daily 22)  Citracal Plus  Tabs (Multiple Minerals-Vitamins) .... 2 By Mouth Daily 23)  Albuterol Sulfate (2.5 Mg/58ml) 0.083% Nebu (Albuterol Sulfate) .... Prn 24)  Bd Ultra-Fine 33 Lancets   Misc (Lancets) .Marland KitchenMarland KitchenMarland Kitchen  Test Two Times A Day or As Directed 25)  Bd Pen Needle Mini U/f 31g X 5 Mm Misc (Insulin Pen Needle) .... Use As Directed 26)  Onetouch Test  Strp (Glucose Blood) .... Use As Directed 27)  Fluticasone Propionate 50 Mcg/act Susp (Fluticasone Propionate) .... 2 Sprays in Each Nostril Daily  Allergies: 1)  Omnicef 2)  * Cipro (Ciprofloxacin) 3)  Riomet (Metformin Hcl) 4)  Glyset (Miglitol) 5)  * Serevent (Salmeterol Xinafoate) 6)  * Niacin 7)  Biaxin (Clarithromycin)  Past History:  Past medical, surgical, family and social histories (including risk factors) reviewed, and no changes noted (except as noted below).  Past Medical History: Reviewed history from 11/12/2008 and no changes  required. Allergic rhinitis-nasal polyps Asthma---osp 1997 Atrial fibrillation--hosp. 1994 Diabetes mellitus, type II GERD Hyperlipidemia Hypothyroidism Osteopenia Sleep disorder  Past Surgical History: Reviewed history from 01/12/2007 and no changes required. Cholecystectomy-1993 Strabismus surgery-1946 & 1967 VDx2 Cervical conization-1960's Nasal polypectomy-1/01  Family History: Reviewed history from 01/12/2007 and no changes required. Mom died of asthma or ?PE--45 Dad died at 94  ?CAD  Social History: Reviewed history from 01/12/2007 and no changes required. Married Never Smoked Alcohol use-yes 2 children  Review of Systems General:  Denies fatigue and fever. CV:  Denies chest pain or discomfort. Resp:  Denies shortness of breath.  Physical Exam  General:  elderly overweight female in NAD  Head:  no maxillary sinus ttp  Eyes:  No corneal or conjunctival inflammation noted. EOMI. Perrla. Funduscopic exam benign, without hemorrhages, exudates or papilledema. Vision grossly normal. Ears:  External ear exam shows no significant lesions or deformities.  Otoscopic examination reveals clear canals, tympanic membranes are intact bilaterally without bulging, retraction, inflammation or discharge. Hearing is grossly normal bilaterally. Nose:  External nasal examination shows no deformity or inflammation. Nasal mucosa are pink and moist without lesions or exudates. Mouth:  MMM, no oral lesions Neck:  no cervical or supraclavicular lymphadenopathy  Lungs:  Normal respiratory effort, chest expands symmetrically. Lungs are clear to auscultation, no crackles or wheezes. Heart:  Normal rate and regular rhythm. S1 and S2 normal without gallop, murmur, click, rub or other extra sounds. Skin:  erythema across anterior neck, no blisters, no pustules   Impression & Recommendations:  Problem # 1:  SKIN RASH (ICD-782.1) Most consistent with allergic source.  Stop OTC  meclizine. Treat with short course of steroids and antihistamine.  Call if not improving in 72 hours.  Gi to ER if shortness or breath, tounge or lip swelling.   Complete Medication List: 1)  Levothyroxine Sodium 88 Mcg Tabs (Levothyroxine sodium) .Marland Kitchen.. 1 daily 2)  Flovent Hfa 44 Mcg/act Aero (Fluticasone propionate  hfa) .... As needed 3)  Xanax 0.25 Mg Tabs (Alprazolam) .Marland Kitchen.. 1-2  by mouth two times a day as needed 4)  Lanoxin 0.25 Mg Tabs (Digoxin) .... 1/2 tab daily 5)  Lopressor 50 Mg Tabs (Metoprolol tartrate) .... 1/2 tab daily 6)  Glucotrol Xl 10 Mg Tb24 (Glipizide) .... Take 1 tab daily before breakfast or lunch 7)  Omeprazole 20 Mg Cpdr (Omeprazole) .... Take 1 tablet by mouth two times a day- 8)  Antivert 25 Mg Tabs (Meclizine hcl) .Marland Kitchen.. 1 by mouth three times a day as needed 9)  Lantus 100 Unit/ml Soln (Insulin glargine) .... Take 62  units daily 10)  Loratadine 10 Mg Tabs (Loratadine) .Marland Kitchen.. 1-2 by mouth daily as needed 11)  Aspirin 81 Mg Tbec (Aspirin) .Marland Kitchen.. 1 by mouth daily 12)  Folic  Acid 1 Mg Tabs (Folic acid) .... Daily 13)  Fish Oil 1000 Mg Caps (Omega-3 fatty acids) .... Daily 14)  Selenium 100 Mcg Tabs (Selenium) .... Daily 15)  Chromium 100 Mcg Tabs (Chromium) .... Daily 16)  Coq-10 Caps (Coenzyme q10 caps) .... Daily 17)  Milk Thistle 500 Mg Caps (Milk thistle) .... Daily 18)  Cranberry Concentrate 100-3 Mg-unit Caps (Vitamins c e) .... 2 by mouth daily 19)  Multivitamins Tabs (Multiple vitamin) .Marland Kitchen.. 1 by mouth daily 20)  Vitamin C 500 Mg Tabs (Ascorbic acid) .... Daily 21)  Vitamin B-12 100 Mcg Tabs (Cyanocobalamin) .... Daily 22)  Citracal Plus Tabs (Multiple minerals-vitamins) .... 2 by mouth daily 23)  Albuterol Sulfate (2.5 Mg/108ml) 0.083% Nebu (Albuterol sulfate) .... Prn 24)  Bd Ultra-fine 33 Lancets Misc (Lancets) .... Test two times a day or as directed 25)  Bd Pen Needle Mini U/f 31g X 5 Mm Misc (Insulin pen needle) .... Use as directed 26)  Onetouch Test Strp  (Glucose blood) .... Use as directed 27)  Fluticasone Propionate 50 Mcg/act Susp (Fluticasone propionate) .... 2 sprays in each nostril daily 28)  Prednisone 20 Mg Tabs (Prednisone) .... 3 tabs by mouth daily x 3 days, then 2 tabs by mouth daily x 2 days then 1 tab by mouth daily x 2 days  Patient Instructions: 1)  Most consistent with allergic source. 2)   Stop OTC meclizine. 3)  Treat with short course of steroids and antihistamine. 4)  Prednsione taper sent to pharmacy. 5)   Start OTC antihistamine like claritin during the day. 6)   Call if not improving in 72 hours.  7)  Gi to ER if shortness or breath, tounge or lip swelling.  Prescriptions: PREDNISONE 20 MG TABS (PREDNISONE) 3 tabs by mouth daily x 3 days, then 2 tabs by mouth daily x 2 days then 1 tab by mouth daily x 2 days  #15 x 0   Entered and Authorized by:   Kerby Nora MD   Signed by:   Kerby Nora MD on 10/29/2010   Method used:   Electronically to        [facility]  [address] (retail)       [address], [address]       [place], [place]  [address]       Ph: [phone]       Fax: [phone]   RxID:   [id number]    Orders Added: 1)  Est. Patient Level III [84132]    Current Allergies (reviewed today): OMNICEF * CIPRO (CIPROFLOXACIN) RIOMET (METFORMIN HCL) GLYSET (MIGLITOL) * SEREVENT (SALMETEROL XINAFOATE) * NIACIN BIAXIN (CLARITHROMYCIN)

## 2010-12-24 ENCOUNTER — Encounter: Payer: Self-pay | Admitting: Internal Medicine

## 2010-12-24 ENCOUNTER — Other Ambulatory Visit: Payer: Self-pay | Admitting: Internal Medicine

## 2010-12-24 ENCOUNTER — Ambulatory Visit (INDEPENDENT_AMBULATORY_CARE_PROVIDER_SITE_OTHER): Payer: Medicare PPO | Admitting: Internal Medicine

## 2010-12-24 DIAGNOSIS — E119 Type 2 diabetes mellitus without complications: Secondary | ICD-10-CM

## 2010-12-24 DIAGNOSIS — F438 Other reactions to severe stress: Secondary | ICD-10-CM

## 2010-12-24 DIAGNOSIS — M545 Low back pain: Secondary | ICD-10-CM

## 2010-12-30 NOTE — Assessment & Plan Note (Signed)
Summary: 4 mo f/u dlo   Vital Signs:  Patient profile:   77 year old female Weight:      175 pounds Temp:     98.6 degrees F oral Pulse rate:   58 / minute Pulse rhythm:   regular BP sitting:   153 / 80  (left arm) Cuff size:   large  Vitals Entered By: Mervin Hack CMA Duncan Dull) (December 24, 2010 12:27 PM) CC: follow-up   History of Present Illness: Back still giving her some trouble sciatic nerve seems to be bothering her some Limping mildly  Really has to get back to exercise frustrated with weight being up some  Saw Dr Jenne Campus for funny sensation in right neck/jaw given prednisone but didnt' take it  diabetes has been good cut the glipizide dose as directed --now before eating though sugars better now Most sugars under 100 fasting No hypoglycemic reactions  Stress is better Husband's mood has been a little better lately Getting more support from family  asthma is fine No sig coughing   Allergies: 1)  Omnicef 2)  * Cipro (Ciprofloxacin) 3)  Riomet (Metformin Hcl) 4)  Glyset (Miglitol) 5)  * Serevent (Salmeterol Xinafoate) 6)  * Niacin 7)  Biaxin (Clarithromycin)  Past History:  Past medical, surgical, family and social histories (including risk factors) reviewed for relevance to current acute and chronic problems.  Past Medical History: Reviewed history from 11/12/2008 and no changes required. Allergic rhinitis-nasal polyps Asthma---osp 1997 Atrial fibrillation--hosp. 1994 Diabetes mellitus, type II GERD Hyperlipidemia Hypothyroidism Osteopenia Sleep disorder  Past Surgical History: Reviewed history from 01/12/2007 and no changes required. Cholecystectomy-1993 Strabismus surgery-1946 & 1967 VDx2 Cervical conization-1960's Nasal polypectomy-1/01  Family History: Reviewed history from 01/12/2007 and no changes required. Mom died of asthma or ?PE--45 Dad died at 67  ?CAD  Social History: Reviewed history from 01/12/2007 and no  changes required. Married Never Smoked Alcohol use-yes 2 children  Review of Systems       appetite is good sleeping okay--likes to stay up late and sleep in Heartburn controlled with meds   Physical Exam  General:  alert and normal appearance.   Neck:  supple, no masses, no thyromegaly, and no cervical lymphadenopathy.   Lungs:  normal respiratory effort, no intercostal retractions, no accessory muscle use, normal breath sounds, no crackles, and no wheezes.   Heart:  normal rate, regular rhythm, no murmur, and no gallop.   Extremities:  no edema Psych:  normally interactive, good eye contact, not anxious appearing, and not depressed appearing.     Impression & Recommendations:  Problem # 1:  LOW BACK PAIN, ACUTE (ICD-724.2) Assessment Improved better but persists never took the prednisone--she will try it now will go to chiropractor if that doesn't help  Problem # 2:  DIABETES MELLITUS, TYPE II (ICD-250.00) Assessment: Improved  sugars seem better on lower dose of glipizide will recheck A1c  Her updated medication list for this problem includes:    Glucotrol Xl 10 Mg Tb24 (Glipizide) .Marland Kitchen... Take 1 tab daily before breakfast or lunch    Lantus 100 Unit/ml Soln (Insulin glargine) .Marland Kitchen... Take 62  units daily    Aspirin 81 Mg Tbec (Aspirin) .Marland Kitchen... 1 by mouth daily  Labs Reviewed: Creat: 0.7 (04/05/2010)     Last Eye Exam: No diabetic retinopathy.    (03/15/2010) Reviewed HgBA1c results: 8.3 (10/12/2010)  7.6 (04/05/2010)  Orders: Venipuncture (54098) TLB-A1C / Hgb A1C (Glycohemoglobin) (83036-A1C)  Problem # 3:  ANXIETY, SITUATIONAL (ICD-308.3) Assessment:  Improved husband's improvement has helped her  Complete Medication List: 1)  Levothyroxine Sodium 88 Mcg Tabs (Levothyroxine sodium) .Marland Kitchen.. 1 daily 2)  Flovent Hfa 44 Mcg/act Aero (Fluticasone propionate  hfa) .... As needed 3)  Xanax 0.25 Mg Tabs (Alprazolam) .Marland Kitchen.. 1-2  by mouth two times a day as needed 4)   Lanoxin 0.25 Mg Tabs (Digoxin) .... 1/2 tab daily 5)  Lopressor 50 Mg Tabs (Metoprolol tartrate) .... 1/2 tab daily 6)  Glucotrol Xl 10 Mg Tb24 (Glipizide) .... Take 1 tab daily before breakfast or lunch 7)  Omeprazole 20 Mg Cpdr (Omeprazole) .... Take 1 tablet by mouth two times a day- 8)  Antivert 25 Mg Tabs (Meclizine hcl) .Marland Kitchen.. 1 by mouth three times a day as needed 9)  Lantus 100 Unit/ml Soln (Insulin glargine) .... Take 62  units daily 10)  Loratadine 10 Mg Tabs (Loratadine) .Marland Kitchen.. 1-2 by mouth daily as needed 11)  Aspirin 81 Mg Tbec (Aspirin) .Marland Kitchen.. 1 by mouth daily 12)  Folic Acid 1 Mg Tabs (Folic acid) .... Daily 13)  Fish Oil 1000 Mg Caps (Omega-3 fatty acids) .... Daily 14)  Selenium 100 Mcg Tabs (Selenium) .... Daily 15)  Chromium 100 Mcg Tabs (Chromium) .... Daily 16)  Coq-10 Caps (Coenzyme q10 caps) .... Daily 17)  Milk Thistle 500 Mg Caps (Milk thistle) .... Daily 18)  Cranberry Concentrate 100-3 Mg-unit Caps (Vitamins c e) .... 2 by mouth daily 19)  Multivitamins Tabs (Multiple vitamin) .Marland Kitchen.. 1 by mouth daily 20)  Vitamin C 500 Mg Tabs (Ascorbic acid) .... Daily 21)  Vitamin B-12 100 Mcg Tabs (Cyanocobalamin) .... Daily 22)  Citracal Plus Tabs (Multiple minerals-vitamins) .... 2 by mouth daily 23)  Albuterol Sulfate (2.5 Mg/72ml) 0.083% Nebu (Albuterol sulfate) .... Prn 24)  Bd Ultra-fine 33 Lancets Misc (Lancets) .... Test two times a day or as directed 25)  Bd Pen Needle Mini U/f 31g X 5 Mm Misc (Insulin pen needle) .... Use as directed 26)  Onetouch Test Strp (Glucose blood) .... Use as directed 27)  Fluticasone Propionate 50 Mcg/act Susp (Fluticasone propionate) .... 2 sprays in each nostril daily  Patient Instructions: 1)  Try the prednisone for your back and if it isn't better, okay to try the chiropractor 2)  Please schedule a follow-up appointment in 4 months .    Orders Added: 1)  Est. Patient Level IV [[address]] 2)  Venipuncture [36415] 3)  TLB-A1C / Hgb A1C  (Glycohemoglobin) [83036-A1C]    Current Allergies (reviewed today): OMNICEF * CIPRO (CIPROFLOXACIN) RIOMET (METFORMIN HCL) GLYSET (MIGLITOL) * SEREVENT (SALMETEROL XINAFOATE) * NIACIN BIAXIN (CLARITHROMYCIN)

## 2011-02-01 ENCOUNTER — Other Ambulatory Visit: Payer: Self-pay | Admitting: *Deleted

## 2011-02-01 MED ORDER — INSULIN GLARGINE 100 UNIT/ML ~~LOC~~ SOLN: 66.0000 [IU] | Freq: Every day | SUBCUTANEOUS | Status: DC

## 2011-03-23 ENCOUNTER — Encounter: Payer: Self-pay | Admitting: Internal Medicine

## 2011-03-25 ENCOUNTER — Ambulatory Visit (INDEPENDENT_AMBULATORY_CARE_PROVIDER_SITE_OTHER): Payer: Medicare PPO | Admitting: Internal Medicine

## 2011-03-25 ENCOUNTER — Encounter: Payer: Self-pay | Admitting: Internal Medicine

## 2011-03-25 VITALS — BP 140/70 | HR 58 | Temp 98.8°F | Wt 177.0 lb

## 2011-03-25 DIAGNOSIS — I4891 Unspecified atrial fibrillation: Secondary | ICD-10-CM

## 2011-03-25 DIAGNOSIS — J45909 Unspecified asthma, uncomplicated: Secondary | ICD-10-CM

## 2011-03-25 DIAGNOSIS — J309 Allergic rhinitis, unspecified: Secondary | ICD-10-CM

## 2011-03-25 MED ORDER — FLUTICASONE PROPIONATE HFA 44 MCG/ACT IN AERO: 1.0000 | INHALATION_SPRAY | Freq: Two times a day (BID) | RESPIRATORY_TRACT | Status: DC

## 2011-03-25 MED ORDER — ALBUTEROL SULFATE HFA 108 (90 BASE) MCG/ACT IN AERS: 2.0000 | INHALATION_SPRAY | Freq: Four times a day (QID) | RESPIRATORY_TRACT | Status: DC | PRN

## 2011-03-25 NOTE — Progress Notes (Signed)
Subjective:    Patient ID: [name], female    DOB: 1930/12/25, 77 y.o.   MRN: [id number]  HPI Here at husband's insistence Has had slight wheeze in chest Feels her allergies have been okay Flonase and antihistamines  No fever No sig cough  Occ gets mild SOB--usually at night Has had some palpitations at night--might be related to wine (extra glass) On the lanoxin and lopressor still  Has flovent but hasn't been taking Rarely uses the albuterol inhaler  Current outpatient prescriptions:albuterol (PROVENTIL) (2.5 MG/3ML) 0.083% nebulizer solution, Take 2.5 mg by nebulization every 6 (six) hours as needed.  , Disp: , Rfl: ;  ALPRAZolam (XANAX) 0.25 MG tablet, Take 1-2 tablets by mouth two times a day as needed , Disp: , Rfl: ;  aspirin 81 MG tablet, Take 81 mg by mouth daily.  , Disp: , Rfl: ;  BD ULTRA-FINE LANCETS lancets, 2 (two) times daily.  , Disp: , Rfl:  Chromium 100 MCG TABS, Take by mouth daily.  , Disp: , Rfl: ;  co-enzyme Q-10 30 MG capsule, Take 30 mg by mouth daily.  , Disp: , Rfl: ;  digoxin (LANOXIN) 0.25 MG tablet, Take 1/2 tab daily , Disp: , Rfl: ;  fish oil-omega-3 fatty acids 1000 MG capsule, Take 1 g by mouth daily.  , Disp: , Rfl: ;  fluticasone (FLONASE) 50 MCG/ACT nasal spray, 2 sprays by Nasal route daily.  , Disp: , Rfl:  fluticasone (FLOVENT HFA) 44 MCG/ACT inhaler, Inhale 1 puff into the lungs 2 (two) times daily., Disp: 60 Act, Rfl: 0;  folic acid (FOLVITE) 1 MG tablet, Take 1 mg by mouth daily.  , Disp: , Rfl: ;  glipiZIDE (GLUCOTROL) 10 MG 24 hr tablet, Take 10 mg by mouth daily.  , Disp: , Rfl: ;  glucose blood (ONE TOUCH TEST STRIPS) test strip, as directed.  , Disp: , Rfl:  insulin glargine (LANTUS SOLOSTAR) 100 UNIT/ML injection, Inject 66 Units into the skin daily., Disp: 10 mL, Rfl: 3;  Insulin Pen Needle (B-D UF III MINI PEN NEEDLES) 31G X 5 MM MISC, as directed.  , Disp: , Rfl: ;  levothyroxine (SYNTHROID, LEVOTHROID) 88 MCG tablet, Take 88 mcg by  mouth daily.  , Disp: , Rfl: ;  loratadine (CLARITIN) 10 MG tablet, Take 10-20 mg by mouth daily.  , Disp: , Rfl:  meclizine (ANTIVERT) 25 MG tablet, Take 25 mg by mouth 3 (three) times daily as needed.  , Disp: , Rfl: ;  metoprolol (LOPRESSOR) 50 MG tablet, Take 25 mg by mouth daily.  , Disp: , Rfl: ;  Milk Thistle 500 MG CAPS, Take by mouth daily.  , Disp: , Rfl: ;  Multiple Minerals-Vitamins (CITRACAL PLUS PO), Take 2 capsules by mouth daily.  , Disp: , Rfl: ;  Multiple Vitamin (MULTIVITAMIN) capsule, Take 1 capsule by mouth daily.  , Disp: , Rfl:  omeprazole (PRILOSEC) 20 MG capsule, Take 20 mg by mouth 2 (two) times daily.  , Disp: , Rfl: ;  Selenium 100 MCG CAPS, Take by mouth daily.  , Disp: , Rfl: ;  vitamin B-12 (CYANOCOBALAMIN) 100 MCG tablet, Take 50 mcg by mouth daily.  , Disp: , Rfl: ;  vitamin C (ASCORBIC ACID) 500 MG tablet, Take 500 mg by mouth daily.  , Disp: , Rfl: ;  Vitamins C E (CRANBERRY CONCENTRATE) 100-3 MG-UNIT CAPS, Take 2 capsules by mouth daily.  , Disp: , Rfl:  DISCONTD: fluticasone (FLOVENT HFA) 44  MCG/ACT inhaler, Inhale 1 puff into the lungs 2 (two) times daily.  , Disp: , Rfl:   Past Medical History  Diagnosis Date  . Allergy   . Asthma   . Diabetes mellitus   . GERD (gastroesophageal reflux disease)   . Hyperlipidemia   . Thyroid disease   . Atrial fibrillation   . Osteopenia   . Sleep disorder     Past Surgical History  Procedure Date  . Cholecystectomy   . Strabismus surgery 1946 / 1967  . Vaginal delivery     x2  . Cervical cone biopsy 1960's  . Nasal polyp surgery 01/01    Family History  Problem Relation Age of Onset  . Asthma Mother   . Coronary artery disease Father     History   Social History  . Marital Status: Married    Spouse Name: N/A    Number of Children: 2  . Years of Education: N/A   Occupational History  .     Social History Main Topics  . Smoking status: Never Smoker   . Smokeless tobacco: Not on file  . Alcohol Use:  Yes  . Drug Use: Not on file  . Sexually Active: Not on file   Other Topics Concern  . Not on file   Social History Narrative  . No narrative on file   Review of Systems Husband has improved and is more hopeful. Now worrying more about her    Objective:   Physical Exam  Constitutional: She appears well-developed and well-nourished. No distress.  HENT:  Mouth/Throat: Oropharynx is clear and moist. No oropharyngeal exudate.       Moderate pale nasal congestion No sinus tenderness  Neck: Normal range of motion.  Cardiovascular: Normal rate, regular rhythm and normal heart sounds.  Exam reveals no gallop.   No murmur heard. Pulmonary/Chest: Effort normal and breath sounds normal. No respiratory distress. She has no wheezes. She has no rales.  Musculoskeletal: She exhibits no edema and no tenderness.  Lymphadenopathy:    She has no cervical adenopathy.          Assessment & Plan:

## 2011-03-25 NOTE — Patient Instructions (Addendum)
Please take the flovent daily---at least till the end of allergy season

## 2011-03-29 NOTE — Assessment & Plan Note (Signed)
[facility]                                 ON-CALL NOTE   NAME:[name], Hannah ZEITER                      MRN:          [id number]  DATE:08/13/2007                            DOB:          [birth year]    Dr. Karle Starch Patient   TIME OF CALL:  12:51 a.m.   CALLER:  [name].   TELEPHONE NUMBER:  [phone]   The patient is calling with concerns of increasingly elevated blood  pressure.  She has no history of high blood pressure until recently.  About a week ago, she saw her eye doctor and was diagnosed with some  sort of infection.  She was then prescribed two different types of eye  drops that she has been using over the past week.  For several days, she  has felt anxious, nauseated, and has a bad headache.  She started  checking her blood pressure yesterday, and her pressures have steadily  gone up ever since.  Just now, her pressure was 218/110, and she and her  husband are quite concerned.  My advise is to drive to the emergency  room now to be evaluated.     Tera Mater. Clent Ridges, MD  Electronically Signed    SAF/MedQ  DD: 08/13/2007  DT: 08/13/2007  Job #: [id number]

## 2011-03-29 NOTE — Assessment & Plan Note (Signed)
[facility]                         GASTROENTEROLOGY OFFICE NOTE   NAME:[name], [name]                      MRN:          [id number]  DATE:01/01/2008                            DOB:          [birth year]    REFERRING PHYSICIAN:  [name], MD   CHIEF COMPLAINT:  Stomach sensitivity, reflux.   ASSESSMENT:  A 77 year old white woman, who is having mainly nocturnal  indigestion and heartburn symptomatology.  There is some associated  early satiety.  She has diabetes mellitus, but no neuropathy or  retinopathy to suggest severe disease.  Certainly, gastroparesis is  possible.  It may just be typical gastroesophageal reflux disease.   PLAN:  1. Add omeprazole 20 mg before her night-time snack to make it b.i.d.  2. Schedule upper GI endoscopy to look for cause of early satiety or      other mucosal damage, more serious than what we have described      above.  3. If the EGD is negative and she responds to this b.i.d. regimen of      omeprazole, would continue that.  4. Consider evaluation for gastroparesis, depending upon the results      of the clinical therapeutic trial and the investigation.   HISTORY:  Patient is a 77 year old white woman, who has had a long  history of acid indigestion type problems and what she describes as a  sensitive stomach.  She does well throughout the day, it seems, on 20 mg  omeprazole daily, but at night, particularly when she lies down, despite  elevating the head of the bed with pillows, she develops burning in the  epigastrium and up into the chest, as well as some rare regurgitation.  There is also what sounds like some early satiety.  There is no  dysphagia.  She does wait more than three hours after her last meal  before retiring.  She had been eating a snack at bedtime to try to avoid  low blood sugars with her Lantus dosing at night.  She does not use  caffeine and she does not smoke.  She has gained a  fair amount of weight  with Avandia and then Lantus.  Her hemoglobin A1c she says is around 7%  or so, probably 7.5%.  Blood sugars have rarely gotten to 300, but  occasionally they have been to 200 and recently, she took some  prednisone and they were up a little bit, but she is off that now.   MEDICATIONS:  Listed and reviewed in the chart, but include albuterol,  alprazolam, Flonase, glipizide, Lanoxin, Lantus, Lipitor, Lopressor,  Miacalcin, omeprazole 20 mg daily, Synthroid and aspirin.  She is also  on Flovent, Antivert and loratadine.   ALLERGIES:  CIPRO causes a rash.  GLUCOPHAGE caused dyspnea and  weakness.   PAST MEDICAL HISTORY:  1. Diabetes mellitus, type 2.  2. Obesity.  3. Asthma.  4. Osteoarthritis.  5. Hypothyroidism.  6. Allergies and sinus problems.  7. Cholecystectomy.  8. Atrial fibrillation.  9. Lumbar spine disc disease.  10.Dyslipidemia.  11.Gastroesophageal reflux disease.  12.Hypertension.  FAMILY HISTORY:  Diabetes in multiple family members.  Heart disease in  her father.  Grandfather and uncle had gastric cancer.   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  She is  here with her husband.  One son, one daughter.  She uses some alcohol,  no tobacco or drugs.   REVIEW OF SYSTEMS:  See medical history form.   PHYSICAL EXAM:  Reveals a well-developed, well-nourished, obese, white  woman.  Height 5 feet 3, weigh 172 pounds, blood pressure 132/70, pulse 68.  EYES:  Anicteric.  ENT:  Normal mouth, posterior pharynx.  NECK:  Supple, no thyroid mass.  CHEST:  Clear.  HEART:  S1, S2, no murmurs or gallops.  ABDOMEN:  Obese, soft, nontender.  No succussion splash.  No  organomegaly or mass.  No bruits.  LYMPHATIC:  No neck or supraclavicular nodes.  LOWER EXTREMITIES:  Showed trace bilateral edema.  SKIN:  No rash.  NEUROLOGIC:  She is alert and oriented times three.  PSYCHIATRIC:  Appropriate affect.   I appreciate the  opportunity to care for this patient.     Iva Boop, MD,FACG  Electronically Signed    CEG/MedQ  DD: 01/01/2008  DT: 01/02/2008  Job #: [id number]

## 2011-04-01 NOTE — Assessment & Plan Note (Signed)
[facility]                                 ON-CALL NOTE   NAME:CASEYJalessa, Peyser                        MRN:          [id number]  DATE:12/09/2006                            DOB:          1930/11/29    Patient calls me at home even though I am not on call at approximately  11 a.m. on December 09, 2006.  I just saw her in the office, but she is  developing some increasing symptoms of bladder infections that she has  had before.  Increased frequency and mild dysuria.  No significant  systemic symptoms.  She wonders if I can treat with the Macrobid again.  She had been using cranberry prophylaxis and then kind of slacked off on  that.   PLAN:  I will give her 3 days of Macrobid 100 b.i.d. #6 with no refills  to the [facility] in [place] on [address], and she will restart her  cranberry.  If she is not better, we will need to see her next week and  send a culture.     [name], MD  Electronically Signed    RIL/MedQ  DD: 12/09/2006  DT: 12/09/2006  Job #: [id number]

## 2011-04-02 ENCOUNTER — Other Ambulatory Visit: Payer: Self-pay | Admitting: Internal Medicine

## 2011-04-20 ENCOUNTER — Other Ambulatory Visit: Payer: Self-pay | Admitting: Internal Medicine

## 2011-04-26 ENCOUNTER — Other Ambulatory Visit: Payer: Self-pay | Admitting: Internal Medicine

## 2011-04-26 NOTE — Telephone Encounter (Signed)
rx called into pharmacy

## 2011-04-26 NOTE — Telephone Encounter (Signed)
Okay #120 x 0 

## 2011-05-02 ENCOUNTER — Ambulatory Visit (INDEPENDENT_AMBULATORY_CARE_PROVIDER_SITE_OTHER): Payer: Medicare PPO | Admitting: Internal Medicine

## 2011-05-02 ENCOUNTER — Encounter: Payer: Self-pay | Admitting: Internal Medicine

## 2011-05-02 VITALS — BP 138/80 | HR 71 | Temp 99.1°F | Ht 64.0 in | Wt 176.0 lb

## 2011-05-02 DIAGNOSIS — I4891 Unspecified atrial fibrillation: Secondary | ICD-10-CM

## 2011-05-02 DIAGNOSIS — E039 Hypothyroidism, unspecified: Secondary | ICD-10-CM

## 2011-05-02 DIAGNOSIS — I10 Essential (primary) hypertension: Secondary | ICD-10-CM

## 2011-05-02 DIAGNOSIS — E119 Type 2 diabetes mellitus without complications: Secondary | ICD-10-CM

## 2011-05-02 DIAGNOSIS — E785 Hyperlipidemia, unspecified: Secondary | ICD-10-CM

## 2011-05-02 DIAGNOSIS — J45909 Unspecified asthma, uncomplicated: Secondary | ICD-10-CM

## 2011-05-02 NOTE — Progress Notes (Signed)
Addended by: Alvina Chou on: 05/02/2011 04:28 PM   Modules accepted: Orders

## 2011-05-02 NOTE — Assessment & Plan Note (Signed)
Seems to have good control Lab Results  Component Value Date   HGBA1C 7.5* 12/24/2010   Will recheck

## 2011-05-02 NOTE — Progress Notes (Signed)
Subjective:    Patient ID: [name], female    DOB: Jun 20, 1931, 77 y.o.   MRN: [id number]  HPI Doing okay Husband has concerns as usual  Recently saw Dr Fransico Michael Concerned about BP since venous dilation in eyes occ gets hot at night Hasn't checked BP lately though she did buy a new cuff  Asthma has been fine Allergies okay except for runny eyes Hasn't been using flovent--hasn't needed the albuterol either Still uses the flonase for her nose Not too much PND while taking this  Checks sugars every day Only has trouble if she ever needs prednisone--takes months to normalize 88-180's Rarely over 200 Most under 120 No hypoglycemic reactions  No chest pain occ gets mild palpitations if she drinks coffee Breathing is fine  Current Outpatient Prescriptions on File Prior to Visit  Medication Sig Dispense Refill  . albuterol (PROVENTIL HFA) 108 (90 BASE) MCG/ACT inhaler Inhale 2 puffs into the lungs every 6 (six) hours as needed for wheezing.  1 Inhaler  3  . albuterol (PROVENTIL) (2.5 MG/3ML) 0.083% nebulizer solution Take 2.5 mg by nebulization every 6 (six) hours as needed.        . ALPRAZolam (XANAX) 0.25 MG tablet TAKE 1 TO 2 TABLETS BY MOUTH TWICE DAILY AS NEEDED  120 tablet  0  . aspirin 81 MG tablet Take 81 mg by mouth daily.        . BD ULTRA-FINE LANCETS lancets 2 (two) times daily.        . Chromium 100 MCG TABS Take by mouth daily.        Marland Kitchen co-enzyme Q-10 30 MG capsule Take 30 mg by mouth daily.        . digoxin (LANOXIN) 0.25 MG tablet Take 1/2 tab daily       . fish oil-omega-3 fatty acids 1000 MG capsule Take 1 g by mouth daily.        . fluticasone (FLONASE) 50 MCG/ACT nasal spray 2 sprays by Nasal route daily.        . folic acid (FOLVITE) 1 MG tablet Take 1 mg by mouth daily.        Marland Kitchen glipiZIDE (GLUCOTROL) 10 MG 24 hr tablet Take 10 mg by mouth daily.        . insulin glargine (LANTUS SOLOSTAR) 100 UNIT/ML injection Inject 66 Units into the skin daily.  10 mL   3  . Insulin Pen Needle (B-D UF III MINI PEN NEEDLES) 31G X 5 MM MISC as directed.        Marland Kitchen levothyroxine (SYNTHROID, LEVOTHROID) 88 MCG tablet Take 88 mcg by mouth daily.        Marland Kitchen LOPRESSOR 50 MG tablet TAKE ONE-HALF TABLET BY MOUTH DAILY  30 each  11  . loratadine (CLARITIN) 10 MG tablet Take 10-20 mg by mouth daily.        . meclizine (ANTIVERT) 25 MG tablet Take 25 mg by mouth 3 (three) times daily as needed.        . Milk Thistle 500 MG CAPS Take by mouth daily.        . Multiple Minerals-Vitamins (CITRACAL PLUS PO) Take 2 capsules by mouth daily.        . Multiple Vitamin (MULTIVITAMIN) capsule Take 1 capsule by mouth daily.        Marland Kitchen omeprazole (PRILOSEC) 20 MG capsule Take 20 mg by mouth 2 (two) times daily.        . ONE TOUCH ULTRA TEST  test strip USE AS DIRECTED  100 each  11  . Selenium 100 MCG CAPS Take by mouth daily.        . vitamin B-12 (CYANOCOBALAMIN) 100 MCG tablet Take 50 mcg by mouth daily.        . vitamin C (ASCORBIC ACID) 500 MG tablet Take 500 mg by mouth daily.        . Vitamins C E (CRANBERRY CONCENTRATE) 100-3 MG-UNIT CAPS Take 2 capsules by mouth daily.         Past Medical History  Diagnosis Date  . Allergy   . Asthma   . Diabetes mellitus   . GERD (gastroesophageal reflux disease)   . Hyperlipidemia   . Thyroid disease   . Atrial fibrillation   . Osteopenia   . Sleep disorder     Past Surgical History  Procedure Date  . Cholecystectomy   . Strabismus surgery 1946 / 1967  . Vaginal delivery     x2  . Cervical cone biopsy 1960's  . Nasal polyp surgery 01/01    Family History  Problem Relation Age of Onset  . Asthma Mother   . Coronary artery disease Father     History   Social History  . Marital Status: Married    Spouse Name: N/A    Number of Children: 2  . Years of Education: N/A   Occupational History  .     Social History Main Topics  . Smoking status: Never Smoker   . Smokeless tobacco: Not on file  . Alcohol Use: Yes  .  Drug Use: Not on file  . Sexually Active: Not on file   Other Topics Concern  . Not on file   Social History Narrative  . No narrative on file   Review of Systems Appetite is fine occ low back pain--may be sciatic pain Weight is stable Sleeps okay      Objective:   Physical Exam  Constitutional: She appears well-developed and well-nourished. No distress.  Neck: Normal range of motion. Neck supple. No thyromegaly present.  Cardiovascular: Normal rate, regular rhythm, normal heart sounds and intact distal pulses.  Exam reveals no gallop.   No murmur heard. Pulmonary/Chest: Effort normal and breath sounds normal. No respiratory distress. She has no wheezes. She has no rales.  Abdominal: Soft. She exhibits no mass. There is no tenderness.  Musculoskeletal: Normal range of motion. She exhibits no edema and no tenderness.  Lymphadenopathy:    She has no cervical adenopathy.  Skin: No rash noted.       No ulcers or foot problems  Psychiatric: She has a normal mood and affect. Her behavior is normal. Judgment and thought content normal.          Assessment & Plan:

## 2011-05-02 NOTE — Assessment & Plan Note (Signed)
Quiet Hasn't needed any meds

## 2011-05-02 NOTE — Assessment & Plan Note (Signed)
Due for labs

## 2011-05-02 NOTE — Assessment & Plan Note (Signed)
No results found for this basename: LDLCALC   Will recheck Not on meds

## 2011-05-02 NOTE — Assessment & Plan Note (Signed)
BP Readings from Last 3 Encounters:  05/02/11 138/80  03/25/11 140/70  12/24/10 153/80   Good control No changes needed Due for labs

## 2011-05-02 NOTE — Assessment & Plan Note (Signed)
occ feels her heart but no sig symptoms Will check dig level

## 2011-05-03 LAB — HEPATIC FUNCTION PANEL
ALT: 47 U/L — ABNORMAL HIGH (ref 0–35)
AST: 38 U/L — ABNORMAL HIGH (ref 0–37)
Albumin: 4.3 g/dL (ref 3.5–5.2)
Total Bilirubin: 0.4 mg/dL (ref 0.3–1.2)
Total Protein: 7 g/dL (ref 6.0–8.3)

## 2011-05-03 LAB — BASIC METABOLIC PANEL
CO2: 27 mEq/L (ref 19–32)
Chloride: 104 mEq/L (ref 96–112)
Creatinine, Ser: 0.7 mg/dL (ref 0.4–1.2)
Potassium: 4.8 mEq/L (ref 3.5–5.1)

## 2011-05-03 LAB — T4, FREE: Free T4: 1.14 ng/dL (ref 0.60–1.60)

## 2011-05-03 LAB — CBC WITH DIFFERENTIAL/PLATELET
Basophils Relative: 0.2 % (ref 0.0–3.0)
Eosinophils Absolute: 0.7 10*3/uL (ref 0.0–0.7)
HCT: 41.3 % (ref 36.0–46.0)
Lymphs Abs: 2.7 10*3/uL (ref 0.7–4.0)
MCHC: 34.6 g/dL (ref 30.0–36.0)
MCV: 90.7 fl (ref 78.0–100.0)
Monocytes Absolute: 0.8 10*3/uL (ref 0.1–1.0)
Neutro Abs: 4.8 10*3/uL (ref 1.4–7.7)
Neutrophils Relative %: 53 % (ref 43.0–77.0)
RBC: 4.55 Mil/uL (ref 3.87–5.11)

## 2011-05-03 LAB — LIPID PANEL
Cholesterol: 246 mg/dL — ABNORMAL HIGH (ref 0–200)
Total CHOL/HDL Ratio: 6

## 2011-05-03 LAB — DIGOXIN LEVEL: Digoxin Level: 0.9 ng/mL (ref 0.8–2.0)

## 2011-05-03 LAB — LDL CHOLESTEROL, DIRECT: Direct LDL: 146.4 mg/dL

## 2011-05-03 LAB — HEMOGLOBIN A1C: Hgb A1c MFr Bld: 8.3 % — ABNORMAL HIGH (ref 4.6–6.5)

## 2011-05-06 ENCOUNTER — Encounter: Payer: Self-pay | Admitting: *Deleted

## 2011-05-06 ENCOUNTER — Other Ambulatory Visit: Payer: Self-pay | Admitting: Family Medicine

## 2011-05-30 ENCOUNTER — Other Ambulatory Visit: Payer: Self-pay | Admitting: Internal Medicine

## 2011-06-01 ENCOUNTER — Ambulatory Visit: Payer: Self-pay | Admitting: Ophthalmology

## 2011-06-14 ENCOUNTER — Ambulatory Visit: Payer: Self-pay | Admitting: Ophthalmology

## 2011-07-22 ENCOUNTER — Encounter: Payer: Self-pay | Admitting: Internal Medicine

## 2011-07-22 ENCOUNTER — Ambulatory Visit (INDEPENDENT_AMBULATORY_CARE_PROVIDER_SITE_OTHER): Payer: Medicare PPO | Admitting: Internal Medicine

## 2011-07-22 VITALS — BP 138/68 | HR 84 | Temp 98.5°F | Resp 18 | Ht 64.0 in | Wt 176.5 lb

## 2011-07-22 DIAGNOSIS — J45909 Unspecified asthma, uncomplicated: Secondary | ICD-10-CM

## 2011-07-22 DIAGNOSIS — J209 Acute bronchitis, unspecified: Secondary | ICD-10-CM

## 2011-07-22 DIAGNOSIS — R05 Cough: Secondary | ICD-10-CM

## 2011-07-22 MED ORDER — AMOXICILLIN 500 MG PO TABS: 1000.0000 mg | ORAL_TABLET | Freq: Two times a day (BID) | ORAL | Status: AC

## 2011-07-22 MED ORDER — MECLIZINE HCL 25 MG PO TABS: 25.0000 mg | ORAL_TABLET | Freq: Three times a day (TID) | ORAL | Status: DC | PRN

## 2011-07-22 NOTE — Assessment & Plan Note (Signed)
Spirometry is normal No evidence of significant exacerbation Could use the albuterol prn but doesn't seem to have reason to start inhaled or systemic steroids

## 2011-07-22 NOTE — Assessment & Plan Note (Signed)
Seems to be a viral infection Lots of cough but not that productive No serious systemic symptoms Discussed supportive care Antibiotics if worsens

## 2011-07-22 NOTE — Progress Notes (Signed)
Subjective:    Patient ID: [name], female    DOB: [birth year], 78 y.o.   MRN: [id number]  HPI Concerned about cough Started at least 1-2 weeks ago May have had fever last night Does have thin sputum she coughs up and swallows Some bronchial burning  Initially cough was night---but now in day and after eating No sig wheezing--just a little rattle Gets irritation in throat and then she starts coughing --then some mucus No sig SOB Some sore throat---down below tonsils No ear pain  Has used old flovent inhaler?? Not using the albuterol now  Current Outpatient Prescriptions on File Prior to Visit  Medication Sig Dispense Refill  . ALPRAZolam (XANAX) 0.25 MG tablet TAKE 1 TO 2 TABLETS BY MOUTH TWICE DAILY AS NEEDED  120 tablet  0  . aspirin 81 MG tablet Take 81 mg by mouth daily.        . BD ULTRA-FINE LANCETS lancets 2 (two) times daily.        . Chromium 100 MCG TABS Take by mouth daily.        Marland Kitchen co-enzyme Q-10 30 MG capsule Take 30 mg by mouth daily.        . digoxin (LANOXIN) 0.25 MG tablet Take 1/2 tab daily       . fish oil-omega-3 fatty acids 1000 MG capsule Take 1 g by mouth daily.        . fluticasone (FLONASE) 50 MCG/ACT nasal spray 2 sprays by Nasal route daily.        . folic acid (FOLVITE) 1 MG tablet Take 1 mg by mouth daily.        Marland Kitchen glipiZIDE (GLUCOTROL) 10 MG 24 hr tablet Take 10 mg by mouth daily.        . Insulin Pen Needle (B-D UF III MINI PEN NEEDLES) 31G X 5 MM MISC as directed.        Marland Kitchen levothyroxine (SYNTHROID, LEVOTHROID) 88 MCG tablet TAKE ONE TABLET BY MOUTH EVERY DAY  90 tablet  3  . LOPRESSOR 50 MG tablet TAKE ONE-HALF TABLET BY MOUTH DAILY  30 each  11  . Milk Thistle 500 MG CAPS Take by mouth daily.        . Multiple Vitamin (MULTIVITAMIN) capsule Take 1 capsule by mouth daily.        Marland Kitchen omeprazole (PRILOSEC) 20 MG capsule Take 20 mg by mouth daily.       . ONE TOUCH ULTRA TEST test strip USE AS DIRECTED  100 each  11  . vitamin B-12  (CYANOCOBALAMIN) 100 MCG tablet Take 50 mcg by mouth daily.        . vitamin C (ASCORBIC ACID) 500 MG tablet Take 500 mg by mouth daily.        . Vitamins C E (CRANBERRY CONCENTRATE) 100-3 MG-UNIT CAPS Take 2 capsules by mouth daily.        Marland Kitchen albuterol (PROVENTIL HFA) 108 (90 BASE) MCG/ACT inhaler Inhale 2 puffs into the lungs every 6 (six) hours as needed for wheezing.  1 Inhaler  3  . albuterol (PROVENTIL) (2.5 MG/3ML) 0.083% nebulizer solution Take 2.5 mg by nebulization every 6 (six) hours as needed.          Allergies  Allergen Reactions  . Cefdinir     REACTION: unspecified  . Clarithromycin     REACTION: weird dreams (on prednisone at the same time but has tolerated this in the past)  . Metformin  REACTION: unspecified  . Miglitol     REACTION: unspecified  . Niacin     REACTION: unspecified    Past Medical History  Diagnosis Date  . Allergy   . Asthma   . Diabetes mellitus   . GERD (gastroesophageal reflux disease)   . Hyperlipidemia   . Thyroid disease   . Atrial fibrillation   . Osteopenia   . Sleep disorder     Past Surgical History  Procedure Date  . Cholecystectomy   . Strabismus surgery 1946 / 1967  . Vaginal delivery     x2  . Cervical cone biopsy 1960's  . Nasal polyp surgery 01/01    Family History  Problem Relation Age of Onset  . Asthma Mother   . Coronary artery disease Father     History   Social History  . Marital Status: Married    Spouse Name: N/A    Number of Children: 2  . Years of Education: N/A   Occupational History  .     Social History Main Topics  . Smoking status: Never Smoker   . Smokeless tobacco: Not on file  . Alcohol Use: Yes  . Drug Use: Not on file  . Sexually Active: Not on file   Other Topics Concern  . Not on file   Social History Narrative  . No narrative on file   Review of Systems Mild stomach upset No vomiting or diarrhea Appetite is okay     Objective:   Physical Exam  Constitutional:  She appears well-developed and well-nourished. No distress.  HENT:  Head: Normocephalic and atraumatic.  Right Ear: External ear normal.  Left Ear: External ear normal.  Mouth/Throat: Oropharynx is clear and moist. No oropharyngeal exudate.       Mild nasal congestion TMs okay  Neck: Normal range of motion. Neck supple.  Pulmonary/Chest: Effort normal and breath sounds normal. No respiratory distress. She has no wheezes. She has no rales.  Lymphadenopathy:    She has no cervical adenopathy.          Assessment & Plan:

## 2011-08-11 ENCOUNTER — Other Ambulatory Visit: Payer: Self-pay | Admitting: Family Medicine

## 2011-09-02 ENCOUNTER — Ambulatory Visit: Payer: Medicare PPO | Admitting: Internal Medicine

## 2011-09-09 ENCOUNTER — Other Ambulatory Visit: Payer: Self-pay | Admitting: Internal Medicine

## 2011-09-12 ENCOUNTER — Ambulatory Visit (INDEPENDENT_AMBULATORY_CARE_PROVIDER_SITE_OTHER): Payer: Medicare PPO | Admitting: Internal Medicine

## 2011-09-12 ENCOUNTER — Encounter: Payer: Self-pay | Admitting: Internal Medicine

## 2011-09-12 VITALS — BP 166/78 | HR 77 | Temp 98.7°F | Ht 64.0 in | Wt 179.0 lb

## 2011-09-12 DIAGNOSIS — IMO0002 Reserved for concepts with insufficient information to code with codable children: Secondary | ICD-10-CM

## 2011-09-12 DIAGNOSIS — I10 Essential (primary) hypertension: Secondary | ICD-10-CM

## 2011-09-12 DIAGNOSIS — J45909 Unspecified asthma, uncomplicated: Secondary | ICD-10-CM

## 2011-09-12 DIAGNOSIS — E119 Type 2 diabetes mellitus without complications: Secondary | ICD-10-CM

## 2011-09-12 DIAGNOSIS — M949 Disorder of cartilage, unspecified: Secondary | ICD-10-CM

## 2011-09-12 DIAGNOSIS — G479 Sleep disorder, unspecified: Secondary | ICD-10-CM

## 2011-09-12 DIAGNOSIS — Z1231 Encounter for screening mammogram for malignant neoplasm of breast: Secondary | ICD-10-CM

## 2011-09-12 DIAGNOSIS — Z23 Encounter for immunization: Secondary | ICD-10-CM

## 2011-09-12 MED ORDER — CALCITONIN (SALMON) 200 UNIT/ACT NA SOLN: 1.0000 | Freq: Every day | NASAL | Status: DC

## 2011-09-12 NOTE — Assessment & Plan Note (Signed)
Hopefully has better control Lab Results  Component Value Date   HGBA1C 8.3* 05/02/2011   May need to increase lantus if increases

## 2011-09-12 NOTE — Assessment & Plan Note (Signed)
Controlled No sig respiratory symptoms now

## 2011-09-12 NOTE — Progress Notes (Signed)
Subjective:    Patient ID: [name], female    DOB: 1931/08/24, 77 y.o.   MRN: [id number]  HPI Doing okay Got over the respiratory problems No sig cough or SOB  Gets fatigued--very tired at times Better after 2 hour nap  Sleeps well with 1/2 xanax to initiate May sleep as long as 12 hours, then need nap after a meal Doesn't get up till noon mostly Likes to read and do crossword puzzles in bed  Wants to have bone density again Has been off meds for at least 2 years  Checks sugars every morning Some under 100, but most under 130. Occ in high 100's but not over 200 occ gets low numbers but no symptomatic hypoglycemia  No chest pain No palpitations or very rarely  Current Outpatient Prescriptions on File Prior to Visit  Medication Sig Dispense Refill  . albuterol (PROVENTIL HFA) 108 (90 BASE) MCG/ACT inhaler Inhale 2 puffs into the lungs every 6 (six) hours as needed for wheezing.  1 Inhaler  3  . albuterol (PROVENTIL) (2.5 MG/3ML) 0.083% nebulizer solution Take 2.5 mg by nebulization every 6 (six) hours as needed.        . ALPRAZolam (XANAX) 0.25 MG tablet TAKE 1 TO 2 TABLETS BY MOUTH TWICE DAILY AS NEEDED  120 tablet  0  . aspirin 81 MG tablet Take 81 mg by mouth daily.        . BD ULTRA-FINE LANCETS lancets 2 (two) times daily.        Marland Kitchen CALCIUM-VITAMIN D PO Take 1 tablet by mouth daily.        . Chromium 100 MCG TABS Take by mouth daily.        Marland Kitchen co-enzyme Q-10 30 MG capsule Take 30 mg by mouth daily.        . digoxin (LANOXIN) 0.25 MG tablet Take 1/2 tab daily       . fish oil-omega-3 fatty acids 1000 MG capsule Take 1 g by mouth daily.        Marland Kitchen FLOVENT HFA 44 MCG/ACT inhaler INHALE ONE PUFF BY MOUTH TWICE DAILY  11 g  11  . fluticasone (FLONASE) 50 MCG/ACT nasal spray 2 sprays by Nasal route daily.        . folic acid (FOLVITE) 1 MG tablet Take 1 mg by mouth daily.        Marland Kitchen glipiZIDE (GLUCOTROL) 10 MG 24 hr tablet Take 10 mg by mouth daily.        . Insulin Pen  Needle (B-D UF III MINI PEN NEEDLES) 31G X 5 MM MISC as directed.        Marland Kitchen LANTUS SOLOSTAR 100 UNIT/ML injection INJECT 66 UNITS SUBCUTANEOUSLY ONCE A DAY  15 mL  2  . levothyroxine (SYNTHROID, LEVOTHROID) 88 MCG tablet TAKE ONE TABLET BY MOUTH EVERY DAY  90 tablet  3  . LOPRESSOR 50 MG tablet TAKE ONE-HALF TABLET BY MOUTH DAILY  30 each  11  . Milk Thistle 500 MG CAPS Take by mouth daily.        . Multiple Vitamin (MULTIVITAMIN) capsule Take 1 capsule by mouth daily.        Marland Kitchen omeprazole (PRILOSEC) 20 MG capsule Take 20 mg by mouth daily.       . ONE TOUCH ULTRA TEST test strip USE AS DIRECTED  100 each  11  . vitamin B-12 (CYANOCOBALAMIN) 100 MCG tablet Take 50 mcg by mouth daily.        Marland Kitchen  vitamin C (ASCORBIC ACID) 500 MG tablet Take 500 mg by mouth daily.        . Vitamins C E (CRANBERRY CONCENTRATE) 100-3 MG-UNIT CAPS Take 2 capsules by mouth daily.          Allergies  Allergen Reactions  . Cefdinir     REACTION: unspecified  . Clarithromycin     REACTION: weird dreams (on prednisone at the same time but has tolerated this in the past)  . Metformin     REACTION: unspecified  . Miglitol     REACTION: unspecified  . Niacin     REACTION: unspecified    Past Medical History  Diagnosis Date  . Allergy   . Asthma   . Diabetes mellitus   . GERD (gastroesophageal reflux disease)   . Hyperlipidemia   . Thyroid disease   . Atrial fibrillation   . Osteopenia   . Sleep disorder     Past Surgical History  Procedure Date  . Cholecystectomy   . Strabismus surgery 1946 / 1967  . Vaginal delivery     x2  . Cervical cone biopsy 1960's  . Nasal polyp surgery 01/01    Family History  Problem Relation Age of Onset  . Asthma Mother   . Coronary artery disease Father     History   Social History  . Marital Status: Married    Spouse Name: N/A    Number of Children: 2  . Years of Education: N/A   Occupational History  .     Social History Main Topics  . Smoking status:  Never Smoker   . Smokeless tobacco: Never Used  . Alcohol Use: Yes  . Drug Use: Not on file  . Sexually Active: Not on file   Other Topics Concern  . Not on file   Social History Narrative  . No narrative on file   Review of Systems Generalized aches and pains Not really exercising Weight fairly stable    Objective:   Physical Exam  Constitutional: She appears well-developed and well-nourished. No distress.  Neck: Normal range of motion. Neck supple. No thyromegaly present.  Cardiovascular: Normal rate, regular rhythm, normal heart sounds and intact distal pulses.  Exam reveals no gallop.   No murmur heard. Pulmonary/Chest: Effort normal and breath sounds normal. No respiratory distress. She has no wheezes. She has no rales.  Musculoskeletal: Normal range of motion. She exhibits no edema and no tenderness.  Lymphadenopathy:    She has no cervical adenopathy.  Psychiatric: She has a normal mood and affect. Her behavior is normal. Judgment and thought content normal.          Assessment & Plan:

## 2011-09-12 NOTE — Assessment & Plan Note (Signed)
BP Readings from Last 3 Encounters:  09/12/11 166/78  07/22/11 138/68  05/02/11 138/80   Had been okay Higher today but no change for now

## 2011-09-12 NOTE — Assessment & Plan Note (Signed)
Discussed sleep hygiene Suggested awakening at same time every morning --like 10AM ?exercise (she just won't)

## 2011-09-12 NOTE — Assessment & Plan Note (Signed)
Actually feels this is worse since stopping miacalcin Will try this again

## 2011-09-12 NOTE — Assessment & Plan Note (Signed)
Will restart the miacalcin

## 2011-09-12 NOTE — Patient Instructions (Signed)
Please set up your mammogram. 

## 2011-09-13 LAB — HEMOGLOBIN A1C: Hgb A1c MFr Bld: 8.3 % — ABNORMAL HIGH (ref 4.6–6.5)

## 2011-09-14 ENCOUNTER — Other Ambulatory Visit: Payer: Self-pay | Admitting: *Deleted

## 2011-09-14 MED ORDER — FLUTICASONE PROPIONATE 50 MCG/ACT NA SUSP: 2.0000 | Freq: Every day | NASAL | Status: DC

## 2011-10-12 ENCOUNTER — Ambulatory Visit: Payer: Medicare PPO | Admitting: Family Medicine

## 2011-10-13 ENCOUNTER — Ambulatory Visit (INDEPENDENT_AMBULATORY_CARE_PROVIDER_SITE_OTHER): Payer: Medicare PPO | Admitting: Internal Medicine

## 2011-10-13 ENCOUNTER — Encounter: Payer: Self-pay | Admitting: Internal Medicine

## 2011-10-13 DIAGNOSIS — J019 Acute sinusitis, unspecified: Secondary | ICD-10-CM

## 2011-10-13 DIAGNOSIS — J45909 Unspecified asthma, uncomplicated: Secondary | ICD-10-CM

## 2011-10-13 MED ORDER — FLUTICASONE PROPIONATE 50 MCG/ACT NA SUSP: 2.0000 | Freq: Every day | NASAL | Status: DC

## 2011-10-13 MED ORDER — AMOXICILLIN-POT CLAVULANATE 875-125 MG PO TABS: 1.0000 | ORAL_TABLET | Freq: Two times a day (BID) | ORAL | Status: AC

## 2011-10-13 MED ORDER — PREDNISONE 20 MG PO TABS: 40.0000 mg | ORAL_TABLET | Freq: Every day | ORAL | Status: AC

## 2011-10-13 MED ORDER — FLUTICASONE PROPIONATE HFA 44 MCG/ACT IN AERO: 2.0000 | INHALATION_SPRAY | Freq: Two times a day (BID) | RESPIRATORY_TRACT | Status: DC

## 2011-10-13 NOTE — Progress Notes (Signed)
Subjective:    Patient ID: [name], female    DOB: 1931/06/24, 77 y.o.   MRN: [id number]  HPI Having chest infection again "weird" cough Discomfort like "pinprick" under xiphoid Cough worse when lying down  Did improve with prednisone and steroid in September ??never completely got back to normal Wheezing at night per husband Hasn't been taking the flovent Hasn't been taking the albuterol either  Cough with occ mucus---mostly just swallows it (slight yellow) Doesn't feel SOB  Has "sensitive" throat-not clearly sore now Nasal congestion continues---feels the generic fluticasone doesn't work like flonase No ear pain  Current Outpatient Prescriptions on File Prior to Visit  Medication Sig Dispense Refill  . albuterol (PROVENTIL HFA) 108 (90 BASE) MCG/ACT inhaler Inhale 2 puffs into the lungs every 6 (six) hours as needed for wheezing.  1 Inhaler  3  . albuterol (PROVENTIL) (2.5 MG/3ML) 0.083% nebulizer solution Take 2.5 mg by nebulization every 6 (six) hours as needed.        . ALPRAZolam (XANAX) 0.25 MG tablet TAKE 1 TO 2 TABLETS BY MOUTH TWICE DAILY AS NEEDED  120 tablet  0  . aspirin 81 MG tablet Take 81 mg by mouth daily.        . BD ULTRA-FINE LANCETS lancets 2 (two) times daily.        . calcitonin, salmon, (FORTICAL) 200 UNIT/ACT nasal spray Place 1 spray into the nose daily.  3.7 mL  12  . CALCIUM-VITAMIN D PO Take 1 tablet by mouth daily.        . Chromium 100 MCG TABS Take by mouth daily.        . digoxin (LANOXIN) 0.25 MG tablet Take 1/2 tab daily       . fish oil-omega-3 fatty acids 1000 MG capsule Take 1 g by mouth daily.        Marland Kitchen FLOVENT HFA 44 MCG/ACT inhaler INHALE ONE PUFF BY MOUTH TWICE DAILY  11 g  11  . folic acid (FOLVITE) 1 MG tablet Take 1 mg by mouth daily.        Marland Kitchen glipiZIDE (GLUCOTROL) 10 MG 24 hr tablet Take 10 mg by mouth daily.        . Insulin Pen Needle (B-D UF III MINI PEN NEEDLES) 31G X 5 MM MISC as directed.        Marland Kitchen LANTUS SOLOSTAR 100  UNIT/ML injection INJECT 66 UNITS SUBCUTANEOUSLY ONCE A DAY  15 mL  2  . levothyroxine (SYNTHROID, LEVOTHROID) 88 MCG tablet TAKE ONE TABLET BY MOUTH EVERY DAY  90 tablet  3  . LOPRESSOR 50 MG tablet TAKE ONE-HALF TABLET BY MOUTH DAILY  30 each  11  . Milk Thistle 500 MG CAPS Take by mouth daily.        . Multiple Vitamin (MULTIVITAMIN) capsule Take 1 capsule by mouth daily.        Marland Kitchen omeprazole (PRILOSEC) 20 MG capsule Take 20 mg by mouth daily.       . ONE TOUCH ULTRA TEST test strip USE AS DIRECTED  100 each  11  . vitamin B-12 (CYANOCOBALAMIN) 100 MCG tablet Take 50 mcg by mouth daily.        . vitamin C (ASCORBIC ACID) 500 MG tablet Take 500 mg by mouth daily.        . Vitamins C E (CRANBERRY CONCENTRATE) 100-3 MG-UNIT CAPS Take 2 capsules by mouth daily.          Allergies  Allergen Reactions  .  Cefdinir     REACTION: unspecified  . Clarithromycin     REACTION: weird dreams (on prednisone at the same time but has tolerated this in the past)  . Metformin     REACTION: unspecified  . Miglitol     REACTION: unspecified  . Niacin     REACTION: unspecified    Past Medical History  Diagnosis Date  . Allergy   . Asthma   . Diabetes mellitus   . GERD (gastroesophageal reflux disease)   . Hyperlipidemia   . Thyroid disease   . Atrial fibrillation   . Osteopenia   . Sleep disorder     Past Surgical History  Procedure Date  . Cholecystectomy   . Strabismus surgery 1946 / 1967  . Vaginal delivery     x2  . Cervical cone biopsy 1960's  . Nasal polyp surgery 01/01    Family History  Problem Relation Age of Onset  . Asthma Mother   . Coronary artery disease Father     History   Social History  . Marital Status: Married    Spouse Name: N/A    Number of Children: 2  . Years of Education: N/A   Occupational History  .     Social History Main Topics  . Smoking status: Never Smoker   . Smokeless tobacco: Never Used  . Alcohol Use: Yes  . Drug Use: Not on file    . Sexually Active: Not on file   Other Topics Concern  . Not on file   Social History Narrative  . No narrative on file       Review of Systems No nausea or vomiting Appetite is down a little    Objective:   Physical Exam  Constitutional: She appears well-developed and well-nourished. No distress.  HENT:  Right Ear: External ear normal.  Left Ear: External ear normal.  Mouth/Throat: Oropharynx is clear and moist. No oropharyngeal exudate.       Mild maxillary tenderness Moderate inflammation in nose  Neck: Normal range of motion. Neck supple.  Pulmonary/Chest: Effort normal. No respiratory distress. She has wheezes. She has no rales.       Good air movement Mild exp phase prolongation and wheezes  Lymphadenopathy:    She has no cervical adenopathy.          Assessment & Plan:

## 2011-10-13 NOTE — Assessment & Plan Note (Signed)
Clearly has mild but persistent asthma counselled over half of 25 minute visit Has to stay on the flovent daily Use the albuterol for now then can stop when better Will give pulse of prednisone

## 2011-10-13 NOTE — Assessment & Plan Note (Signed)
Seems to have another infection Will treat with augmentin

## 2011-10-13 NOTE — Patient Instructions (Signed)
Please use the flovent with spacer 2 puffs twice a day EVERY DAY! Use the albuterol as needed---take at bedtime for the next few nights, then only as needed when you are better

## 2011-11-01 ENCOUNTER — Other Ambulatory Visit: Payer: Self-pay | Admitting: Internal Medicine

## 2011-11-03 ENCOUNTER — Telehealth: Payer: Self-pay | Admitting: Internal Medicine

## 2011-11-03 MED ORDER — FIRST-DUKES MOUTHWASH MT SUSP: OROMUCOSAL | Status: DC

## 2011-11-03 NOTE — Telephone Encounter (Signed)
Treated for bronchial infection and was taking augmentin and prednisone and she thinks she has a yeast infection.  Mouth and lips hurt.  Can she get a mouthwash called into [facility].  Call back to discuss at [phone]

## 2011-11-03 NOTE — Telephone Encounter (Signed)
rx sent to pharmacy by e-script Spoke with patient and advised results   

## 2011-11-03 NOTE — Telephone Encounter (Signed)
Okay  [facility] mouthwash 5cc swish and spit 4 times daily #8oz x 1

## 2011-11-14 ENCOUNTER — Other Ambulatory Visit: Payer: Self-pay | Admitting: Internal Medicine

## 2011-11-18 ENCOUNTER — Other Ambulatory Visit: Payer: Self-pay | Admitting: Internal Medicine

## 2011-11-21 ENCOUNTER — Other Ambulatory Visit: Payer: Self-pay | Admitting: Family Medicine

## 2011-11-21 ENCOUNTER — Ambulatory Visit: Payer: Medicare PPO | Admitting: Internal Medicine

## 2011-11-21 ENCOUNTER — Encounter: Payer: Self-pay | Admitting: Internal Medicine

## 2011-11-21 ENCOUNTER — Ambulatory Visit (INDEPENDENT_AMBULATORY_CARE_PROVIDER_SITE_OTHER): Payer: Medicare PPO | Admitting: Internal Medicine

## 2011-11-21 VITALS — BP 132/70 | HR 72 | Temp 98.3°F | Ht 64.0 in | Wt 174.0 lb

## 2011-11-21 DIAGNOSIS — I4891 Unspecified atrial fibrillation: Secondary | ICD-10-CM

## 2011-11-21 DIAGNOSIS — I10 Essential (primary) hypertension: Secondary | ICD-10-CM

## 2011-11-21 DIAGNOSIS — E119 Type 2 diabetes mellitus without complications: Secondary | ICD-10-CM

## 2011-11-21 DIAGNOSIS — F4389 Other reactions to severe stress: Secondary | ICD-10-CM

## 2011-11-21 DIAGNOSIS — J45909 Unspecified asthma, uncomplicated: Secondary | ICD-10-CM

## 2011-11-21 DIAGNOSIS — IMO0002 Reserved for concepts with insufficient information to code with codable children: Secondary | ICD-10-CM

## 2011-11-21 DIAGNOSIS — F438 Other reactions to severe stress: Secondary | ICD-10-CM

## 2011-11-21 MED ORDER — ALPRAZOLAM 0.25 MG PO TABS: 0.2500 mg | ORAL_TABLET | Freq: Two times a day (BID) | ORAL | Status: DC | PRN

## 2011-11-21 MED ORDER — OMEPRAZOLE 20 MG PO CPDR: 20.0000 mg | DELAYED_RELEASE_CAPSULE | Freq: Every day | ORAL | Status: DC

## 2011-11-21 NOTE — Assessment & Plan Note (Signed)
BP Readings from Last 3 Encounters:  11/21/11 132/70  10/13/11 166/79  09/12/11 166/78   Better today No changes needed Lab Results  Component Value Date   CREATININE 0.7 05/02/2011

## 2011-11-21 NOTE — Assessment & Plan Note (Signed)
Limits her activity

## 2011-11-21 NOTE — Assessment & Plan Note (Signed)
Some palpitations after evening coffee Nothing to suggest she is having real recurrence On dig, metoprolol and aspirin

## 2011-11-21 NOTE — Assessment & Plan Note (Signed)
Mostly controlled Did have mild exacerbation with resp illness recently

## 2011-11-21 NOTE — Progress Notes (Signed)
Subjective:    Patient ID: [name], female    DOB: [birth year], 78 y.o.   MRN: [id number]  HPI Doing okay Sinusitis and resp illness all better  Has noted some palpitations at night She likes a small piece of dark chocolate before going to bed at night Discussed that this is not a good idea---reflux, sleep, etc No palpitations during the day  No chest pain No SOB  Sugar is higher in AM Checks daily in AM 9-11AM 76-204 (after cherry pie) Was higher after needing prednisone No clinical hypoglycemia  Stomach has been quiet on omeprazole  Uses the xanax occ for nerves--splits them Sporadic in general  Current Outpatient Prescriptions on File Prior to Visit  Medication Sig Dispense Refill  . albuterol (PROVENTIL HFA) 108 (90 BASE) MCG/ACT inhaler Inhale 2 puffs into the lungs every 6 (six) hours as needed for wheezing.  1 Inhaler  3  . ALPRAZolam (XANAX) 0.25 MG tablet TAKE 1 TO 2 TABLETS BY MOUTH TWICE DAILY AS NEEDED  120 tablet  0  . aspirin 81 MG tablet Take 81 mg by mouth daily.        . B-D UF III MINI PEN NEEDLES 31G X 5 MM MISC USE AS DIRECTED  100 each  12  . BD ULTRA-FINE LANCETS lancets 2 (two) times daily.        . calcitonin, salmon, (FORTICAL) 200 UNIT/ACT nasal spray Place 1 spray into the nose daily.  3.7 mL  12  . CALCIUM-VITAMIN D PO Take 1 tablet by mouth daily.        . Chromium 100 MCG TABS Take by mouth daily.        . Coenzyme Q10 200 MG capsule Take 200 mg by mouth daily.        . digoxin (LANOXIN) 0.25 MG tablet Take 1/2 tab daily       . fish oil-omega-3 fatty acids 1000 MG capsule Take 1 g by mouth daily.        . fluticasone (FLONASE) 50 MCG/ACT nasal spray Place 2 sprays into the nose daily.  16 g  11  . fluticasone (FLOVENT HFA) 44 MCG/ACT inhaler Inhale 2 puffs into the lungs 2 (two) times daily.  11 g  11  . folic acid (FOLVITE) 1 MG tablet Take 1 mg by mouth daily.        Marland Kitchen glipiZIDE (GLUCOTROL XL) 10 MG 24 hr tablet TAKE TWO TABLETS BY  MOUTH EVERY DAY  180 tablet  0  . LANTUS SOLOSTAR 100 UNIT/ML injection INJECT 66 UNITS SUBCUTANEOUSLY ONCE A DAY  15 mL  2  . levothyroxine (SYNTHROID, LEVOTHROID) 88 MCG tablet TAKE ONE TABLET BY MOUTH EVERY DAY  90 tablet  3  . LOPRESSOR 50 MG tablet TAKE ONE-HALF TABLET BY MOUTH DAILY  30 each  11  . Milk Thistle 500 MG CAPS Take by mouth daily.        . Multiple Vitamin (MULTIVITAMIN) capsule Take 1 capsule by mouth daily.        . ONE TOUCH ULTRA TEST test strip USE AS DIRECTED  100 each  11  . vitamin B-12 (CYANOCOBALAMIN) 100 MCG tablet Take 50 mcg by mouth daily.        . vitamin C (ASCORBIC ACID) 500 MG tablet Take 500 mg by mouth daily.        . Vitamins C E (CRANBERRY CONCENTRATE) 100-3 MG-UNIT CAPS Take 2 capsules by mouth daily.  Allergies  Allergen Reactions  . Cefdinir     REACTION: unspecified  . Clarithromycin     REACTION: weird dreams (on prednisone at the same time but has tolerated this in the past)  . Metformin     REACTION: unspecified  . Miglitol     REACTION: unspecified  . Niacin     REACTION: unspecified    Past Medical History  Diagnosis Date  . Allergy   . Asthma   . Diabetes mellitus   . GERD (gastroesophageal reflux disease)   . Hyperlipidemia   . Thyroid disease   . Atrial fibrillation   . Osteopenia   . Sleep disorder     Past Surgical History  Procedure Date  . Cholecystectomy   . Strabismus surgery 1946 / 1967  . Vaginal delivery     x2  . Cervical cone biopsy 1960's  . Nasal polyp surgery 01/01    Family History  Problem Relation Age of Onset  . Asthma Mother   . Coronary artery disease Father     History   Social History  . Marital Status: Married    Spouse Name: N/A    Number of Children: 2  . Years of Education: N/A   Occupational History  .     Social History Main Topics  . Smoking status: Never Smoker   . Smokeless tobacco: Never Used  . Alcohol Use: Yes  . Drug Use: Not on file  . Sexually  Active: Not on file   Other Topics Concern  . Not on file   Social History Narrative  . No narrative on file   Review of Systems Appetite is fine Weight is down a few pounds     Objective:   Physical Exam  Constitutional: She appears well-developed and well-nourished. No distress.  Neck: Normal range of motion. Neck supple. No thyromegaly present.  Cardiovascular: Normal rate, regular rhythm, normal heart sounds and intact distal pulses.  Exam reveals no gallop.   No murmur heard. Pulmonary/Chest: Effort normal and breath sounds normal. No respiratory distress. She has no wheezes. She has no rales.  Abdominal: Soft. There is no tenderness.  Musculoskeletal: She exhibits no edema and no tenderness.  Lymphadenopathy:    She has no cervical adenopathy.  Psychiatric: She has a normal mood and affect. Her behavior is normal. Judgment and thought content normal.          Assessment & Plan:

## 2011-11-21 NOTE — Assessment & Plan Note (Signed)
Only uses the xanax occ Will refill

## 2011-11-21 NOTE — Assessment & Plan Note (Signed)
Lab Results  Component Value Date   HGBA1C 8.3* 09/12/2011   Still suboptimal control Not compliant with diet or exercise Will recheck and increase lantus if still over 8%

## 2011-11-22 ENCOUNTER — Telehealth: Payer: Self-pay | Admitting: *Deleted

## 2011-11-22 LAB — HEMOGLOBIN A1C: Hgb A1c MFr Bld: 8.4 % — ABNORMAL HIGH (ref 4.6–6.5)

## 2011-11-22 MED ORDER — INSULIN GLARGINE 100 UNIT/ML ~~LOC~~ SOLN: 66.0000 [IU] | Freq: Every day | SUBCUTANEOUS | Status: DC

## 2011-11-22 NOTE — Telephone Encounter (Signed)
rx sent to pharmacy by e-script  

## 2012-01-03 ENCOUNTER — Other Ambulatory Visit: Payer: Self-pay | Admitting: Internal Medicine

## 2012-01-16 ENCOUNTER — Ambulatory Visit (INDEPENDENT_AMBULATORY_CARE_PROVIDER_SITE_OTHER): Payer: Medicare PPO | Admitting: Internal Medicine

## 2012-01-16 ENCOUNTER — Encounter: Payer: Self-pay | Admitting: Internal Medicine

## 2012-01-16 VITALS — BP 128/70 | HR 63 | Temp 97.7°F | Ht 64.0 in | Wt 175.0 lb

## 2012-01-16 DIAGNOSIS — E785 Hyperlipidemia, unspecified: Secondary | ICD-10-CM

## 2012-01-16 DIAGNOSIS — J45909 Unspecified asthma, uncomplicated: Secondary | ICD-10-CM

## 2012-01-16 DIAGNOSIS — E1159 Type 2 diabetes mellitus with other circulatory complications: Secondary | ICD-10-CM | POA: Insufficient documentation

## 2012-01-16 DIAGNOSIS — I4891 Unspecified atrial fibrillation: Secondary | ICD-10-CM

## 2012-01-16 DIAGNOSIS — E083293 Diabetes mellitus due to underlying condition with mild nonproliferative diabetic retinopathy without macular edema, bilateral: Secondary | ICD-10-CM | POA: Insufficient documentation

## 2012-01-16 MED ORDER — ATORVASTATIN CALCIUM 20 MG PO TABS: 20.0000 mg | ORAL_TABLET | Freq: Every day | ORAL | Status: DC

## 2012-01-16 MED ORDER — METOPROLOL SUCCINATE ER 25 MG PO TB24: 25.0000 mg | ORAL_TABLET | Freq: Every day | ORAL | Status: DC

## 2012-01-16 NOTE — Assessment & Plan Note (Signed)
Discussed Rx  She is willing to start Will Rx atorvastatin since she tolerated this in the past

## 2012-01-16 NOTE — Progress Notes (Signed)
Subjective:    Patient ID: [name], female    DOB: [birth year], 78 y.o.   MRN: [id number]  HPI Has decided to stop the nightly drink in hopes that will help her weight Still has the nightly chocolate  Has sort of SOB last night---?related to wine  No sig palpitations 82-199 pre breakfast Doesn't get up till noon or 1PM Has night snack of candy and cheese----per diabetic educator she states  Did increase lantus to 64--didn't go up to 70 as I had asked  Has had some cough No SOB  Current Outpatient Prescriptions on File Prior to Visit  Medication Sig Dispense Refill  . albuterol (PROVENTIL HFA) 108 (90 BASE) MCG/ACT inhaler Inhale 2 puffs into the lungs every 6 (six) hours as needed for wheezing.  1 Inhaler  3  . ALPRAZolam (XANAX) 0.25 MG tablet Take 1 tablet (0.25 mg total) by mouth 2 (two) times daily as needed for sleep.  100 tablet  0  . aspirin 81 MG tablet Take 81 mg by mouth daily.        . B-D UF III MINI PEN NEEDLES 31G X 5 MM MISC USE AS DIRECTED  100 each  12  . B-D ULTRA-FINE 33 LANCETS MISC USE ONE LANCET TWICE DAILY OR AS DIRECTED.  200 each  9  . calcitonin, salmon, (FORTICAL) 200 UNIT/ACT nasal spray Place 1 spray into the nose daily.  3.7 mL  12  . CALCIUM-VITAMIN D PO Take 1 tablet by mouth daily.        . Chromium 100 MCG TABS Take by mouth daily.        . Coenzyme Q10 200 MG capsule Take 200 mg by mouth daily.        . digoxin (LANOXIN) 0.25 MG tablet Take 1/2 tab daily       . fish oil-omega-3 fatty acids 1000 MG capsule Take 1 g by mouth daily.        . fluticasone (FLONASE) 50 MCG/ACT nasal spray Place 2 sprays into the nose daily.  16 g  11  . fluticasone (FLOVENT HFA) 44 MCG/ACT inhaler Inhale 2 puffs into the lungs 2 (two) times daily.  11 g  11  . folic acid (FOLVITE) 1 MG tablet Take 1 mg by mouth daily.        Marland Kitchen glipiZIDE (GLUCOTROL XL) 10 MG 24 hr tablet TAKE TWO TABLETS BY MOUTH EVERY DAY  180 tablet  0  . insulin glargine (LANTUS SOLOSTAR) 100  UNIT/ML injection Inject 66 Units into the skin daily.  9 mL  3  . levothyroxine (SYNTHROID, LEVOTHROID) 88 MCG tablet TAKE ONE TABLET BY MOUTH EVERY DAY  90 tablet  3  . LOPRESSOR 50 MG tablet TAKE ONE-HALF TABLET BY MOUTH DAILY  30 each  11  . Milk Thistle 500 MG CAPS Take by mouth daily.        . Multiple Vitamin (MULTIVITAMIN) capsule Take 1 capsule by mouth daily.        Marland Kitchen omeprazole (PRILOSEC) 20 MG capsule Take 1 capsule (20 mg total) by mouth daily.  90 capsule  3  . ONE TOUCH ULTRA TEST test strip USE AS DIRECTED  100 each  11  . vitamin B-12 (CYANOCOBALAMIN) 100 MCG tablet Take 50 mcg by mouth daily.        . vitamin C (ASCORBIC ACID) 500 MG tablet Take 500 mg by mouth daily.        . Vitamins C E (CRANBERRY  CONCENTRATE) 100-3 MG-UNIT CAPS Take 2 capsules by mouth daily.          Allergies  Allergen Reactions  . Cefdinir     REACTION: unspecified  . Clarithromycin     REACTION: weird dreams (on prednisone at the same time but has tolerated this in the past)  . Metformin     REACTION: unspecified  . Miglitol     REACTION: unspecified  . Niacin     REACTION: unspecified    Past Medical History  Diagnosis Date  . Allergy   . Asthma   . Diabetes mellitus   . GERD (gastroesophageal reflux disease)   . Hyperlipidemia   . Thyroid disease   . Atrial fibrillation   . Osteopenia   . Sleep disorder     Past Surgical History  Procedure Date  . Cholecystectomy   . Strabismus surgery 1946 / 1967  . Vaginal delivery     x2  . Cervical cone biopsy 1960's  . Nasal polyp surgery 01/01    Family History  Problem Relation Age of Onset  . Asthma Mother   . Coronary artery disease Father     History   Social History  . Marital Status: Married    Spouse Name: N/A    Number of Children: 2  . Years of Education: N/A   Occupational History  .     Social History Main Topics  . Smoking status: Never Smoker   . Smokeless tobacco: Never Used  . Alcohol Use: Yes  .  Drug Use: Not on file  . Sexually Active: Not on file   Other Topics Concern  . Not on file   Social History Narrative  . No narrative on file   Review of Systems Weight is stable Same sleep habits Slight wheeze when lies in bed Ran out of flonase---will be restarting     Objective:   Physical Exam  Constitutional: She appears well-developed and well-nourished. No distress.  Neck: Normal range of motion.  Cardiovascular: Normal rate, regular rhythm and normal heart sounds.   No murmur heard. Pulmonary/Chest: Effort normal and breath sounds normal. No respiratory distress. She has no wheezes. She has no rales.  Musculoskeletal: She exhibits no edema.       Thick calves without pitting  Lymphadenopathy:    She has no cervical adenopathy.  Psychiatric: She has a normal mood and affect. Her behavior is normal.          Assessment & Plan:

## 2012-01-16 NOTE — Assessment & Plan Note (Signed)
No clear exacerbations Discussed cutting out or decreasing chocolate if it causes her symptoms Will change metoprolol to succinate

## 2012-01-16 NOTE — Assessment & Plan Note (Signed)
Just not willing to make the lifestyle changes No exercise---discussed Not compliant with diet---negotiated at least some improvement

## 2012-01-16 NOTE — Assessment & Plan Note (Signed)
Some cough but doesn't seem to be active Will consider rechecking spirometry and CXR if persists

## 2012-01-19 ENCOUNTER — Telehealth: Payer: Self-pay | Admitting: Internal Medicine

## 2012-01-19 NOTE — Telephone Encounter (Signed)
Please get the prior auth form I don't remember every seeing this

## 2012-01-19 NOTE — Telephone Encounter (Signed)
Triage Record Num: [id number] Operator: Geanie Berlin Patient Name: [name] Call Date & Time: 01/18/2012 4:50:00PM Patient Phone: [phone] PCP: Tillman Abide Patient Gender: Female PCP Fax : [phone] Patient DOB: June 22, 1931 Practice Name: [facility] Reason for Call: Caller: [name]/Spouse is calling with a question about Flonase.The medication was written by Tillman Abide I on 01/16/12. Has used RX for long time for nasal poylps"only thing that works." Unable to use generic Fluticosone d/t burns nostril and it is the and "does not work." Chemical engineer) requires prior authorization from MD. States pharmacy faxed request 01/17/12 without response. [facility] [employer] [phone]. Info noted and sent to MD for unable to obtain RX med r/t available resources and situation poses immediate risk per Med Questions Guideline. OFFICE PLEASE ASK MD TO FOLLOW UP. Protocol(s) Used: Medication Questions - Adult Recommended Outcome per Protocol: Call Dispensing Pharmacy or Provider Immediately Reason for Outcome: Unable to obtain prescribed medication related to available resources AND situation poses immediate clinical risk Care Advice: ~ 01/18/2012 4:57:56PM Page 1 of 1 [email or web address]

## 2012-01-20 NOTE — Telephone Encounter (Signed)
.  left message to have patient return my call. Spoke with pharmacist at [facility] and they faxed request for prior auth yesterday, per pharmacist pt's husband paid out of pocket for brand name and wanted [place] to do a prior auth, rx was $115.00, will call for prior auth.

## 2012-01-23 NOTE — Telephone Encounter (Signed)
Spoke with patient's husband and they really want this prior auth, per husband pt has tried nasonex and fluticasone and neither work, only brand name works for pt. Prior auth form on your desk.  ( husband knows you are out this week and willing to wait)

## 2012-01-30 NOTE — Telephone Encounter (Signed)
Form done 

## 2012-01-30 NOTE — Telephone Encounter (Signed)
Form faxed back and scanned 

## 2012-02-06 ENCOUNTER — Other Ambulatory Visit: Payer: Self-pay | Admitting: Internal Medicine

## 2012-02-07 ENCOUNTER — Encounter: Payer: Self-pay | Admitting: Internal Medicine

## 2012-02-07 ENCOUNTER — Ambulatory Visit (INDEPENDENT_AMBULATORY_CARE_PROVIDER_SITE_OTHER): Payer: Medicare PPO | Admitting: Internal Medicine

## 2012-02-07 ENCOUNTER — Ambulatory Visit (INDEPENDENT_AMBULATORY_CARE_PROVIDER_SITE_OTHER)
Admission: RE | Admit: 2012-02-07 | Discharge: 2012-02-07 | Disposition: A | Discharge status: Home / Self Care | Payer: Medicare PPO | Source: Ambulatory Visit | Attending: Internal Medicine | Admitting: Internal Medicine

## 2012-02-07 VITALS — BP 130/80 | HR 85 | Temp 98.5°F | Wt 174.0 lb

## 2012-02-07 DIAGNOSIS — R05 Cough: Secondary | ICD-10-CM

## 2012-02-07 DIAGNOSIS — J45909 Unspecified asthma, uncomplicated: Secondary | ICD-10-CM

## 2012-02-07 DIAGNOSIS — J4530 Mild persistent asthma, uncomplicated: Secondary | ICD-10-CM

## 2012-02-07 DIAGNOSIS — Z23 Encounter for immunization: Secondary | ICD-10-CM

## 2012-02-07 MED ORDER — AMOXICILLIN-POT CLAVULANATE 875-125 MG PO TABS: 1.0000 | ORAL_TABLET | Freq: Two times a day (BID) | ORAL | Status: AC

## 2012-02-07 MED ORDER — FLUTICASONE PROPIONATE HFA 110 MCG/ACT IN AERO: 2.0000 | INHALATION_SPRAY | Freq: Every day | RESPIRATORY_TRACT | Status: DC

## 2012-02-07 MED ORDER — PREDNISONE 20 MG PO TABS: 40.0000 mg | ORAL_TABLET | Freq: Every day | ORAL | Status: AC

## 2012-02-07 NOTE — Progress Notes (Signed)
Addended by: Sueanne Margarita on: 02/07/2012 12:00 PM   Modules accepted: Orders

## 2012-02-07 NOTE — Progress Notes (Signed)
Subjective:    Patient ID: [name], female    DOB: 11-16-1930, 78 y.o.   MRN: [id number]  HPI Has been "coughing her head off" per husband Never really recovered from when she started with illness in November May have had low grade fever--feels warm Non productive cough  Wheezing at times No SOB per se Using albuterol prn---most nights and sometimes in day  Current Outpatient Prescriptions on File Prior to Visit  Medication Sig Dispense Refill  . albuterol (PROVENTIL HFA) 108 (90 BASE) MCG/ACT inhaler Inhale 2 puffs into the lungs every 6 (six) hours as needed for wheezing.  1 Inhaler  3  . ALPRAZolam (XANAX) 0.25 MG tablet Take 1 tablet (0.25 mg total) by mouth 2 (two) times daily as needed for sleep.  100 tablet  0  . aspirin 81 MG tablet Take 81 mg by mouth daily.        Marland Kitchen atorvastatin (LIPITOR) 20 MG tablet Take 1 tablet (20 mg total) by mouth daily.  30 tablet  11  . B-D UF III MINI PEN NEEDLES 31G X 5 MM MISC USE AS DIRECTED  100 each  12  . B-D ULTRA-FINE 33 LANCETS MISC USE ONE LANCET TWICE DAILY OR AS DIRECTED.  200 each  9  . calcitonin, salmon, (FORTICAL) 200 UNIT/ACT nasal spray Place 1 spray into the nose daily.  3.7 mL  12  . CALCIUM-VITAMIN D PO Take 1 tablet by mouth daily.        . Chromium 100 MCG TABS Take by mouth daily.        . Coenzyme Q10 200 MG capsule Take 200 mg by mouth daily.        . fish oil-omega-3 fatty acids 1000 MG capsule Take 1 g by mouth daily.        . fluticasone (FLONASE) 50 MCG/ACT nasal spray Place 2 sprays into the nose daily.  16 g  11  . fluticasone (FLOVENT HFA) 44 MCG/ACT inhaler Inhale 2 puffs into the lungs 2 (two) times daily.  11 g  11  . folic acid (FOLVITE) 1 MG tablet Take 1 mg by mouth daily.        Marland Kitchen glipiZIDE (GLUCOTROL XL) 10 MG 24 hr tablet TAKE TWO TABLETS BY MOUTH EVERY DAY  180 tablet  0  . insulin glargine (LANTUS SOLOSTAR) 100 UNIT/ML injection Inject 66 Units into the skin daily.  9 mL  3  . LANOXIN 0.25 MG  tablet TAKE ONE TABLET BY MOUTH EVERY DAY OR AS DIRECTED  60 each  2  . levothyroxine (SYNTHROID, LEVOTHROID) 88 MCG tablet TAKE ONE TABLET BY MOUTH EVERY DAY  90 tablet  3  . Milk Thistle 500 MG CAPS Take by mouth daily.        . Multiple Vitamin (MULTIVITAMIN) capsule Take 1 capsule by mouth daily.        Marland Kitchen omeprazole (PRILOSEC) 20 MG capsule Take 1 capsule (20 mg total) by mouth daily.  90 capsule  3  . ONE TOUCH ULTRA TEST test strip USE AS DIRECTED  100 each  11  . vitamin B-12 (CYANOCOBALAMIN) 100 MCG tablet Take 50 mcg by mouth daily.        . vitamin C (ASCORBIC ACID) 500 MG tablet Take 500 mg by mouth daily.        . Vitamins C E (CRANBERRY CONCENTRATE) 100-3 MG-UNIT CAPS Take 2 capsules by mouth daily.          Allergies  Allergen Reactions  . Cefdinir     REACTION: unspecified  . Clarithromycin     REACTION: weird dreams (on prednisone at the same time but has tolerated this in the past)  . Metformin     REACTION: unspecified  . Miglitol     REACTION: unspecified  . Niacin     REACTION: unspecified    Past Medical History  Diagnosis Date  . Allergy   . Asthma   . Diabetes mellitus   . GERD (gastroesophageal reflux disease)   . Hyperlipidemia   . Thyroid disease   . Atrial fibrillation   . Osteopenia   . Sleep disorder     Past Surgical History  Procedure Date  . Cholecystectomy   . Strabismus surgery 1946 / 1967  . Vaginal delivery     x2  . Cervical cone biopsy 1960's  . Nasal polyp surgery 01/01    Family History  Problem Relation Age of Onset  . Asthma Mother   . Coronary artery disease Father     History   Social History  . Marital Status: Married    Spouse Name: N/A    Number of Children: 2  . Years of Education: N/A   Occupational History  .     Social History Main Topics  . Smoking status: Never Smoker   . Smokeless tobacco: Never Used  . Alcohol Use: Yes  . Drug Use: Not on file  . Sexually Active: Not on file   Other Topics  Concern  . Not on file   Social History Narrative  . No narrative on file   Review of Systems Appetite is okay Reflux seems to be controlled Smell is off with nasal symptoms    Objective:   Physical Exam  Constitutional: She appears well-developed and well-nourished. No distress.  Cardiovascular: Normal rate, regular rhythm and normal heart sounds.  Exam reveals no gallop.   No murmur heard. Pulmonary/Chest: Effort normal. No respiratory distress. She has wheezes. She has no rales.       ?slightly decreased breath sounds Mild exp phase prolongation and exp wheezes  Musculoskeletal: She exhibits no edema.          Assessment & Plan:

## 2012-02-07 NOTE — Assessment & Plan Note (Signed)
Spirometry is normal Ongoing annoying cough---needs albuterol fairly regularly CXR looks fine  Will increase the flovent to 110 2 puffs daily in AM---to increase compliance Will give antibiotic in case symptoms worsen Will give 10 day prednisone also

## 2012-02-19 ENCOUNTER — Other Ambulatory Visit: Payer: Self-pay | Admitting: Internal Medicine

## 2012-03-05 ENCOUNTER — Other Ambulatory Visit: Payer: Self-pay | Admitting: Internal Medicine

## 2012-03-19 ENCOUNTER — Encounter: Payer: Self-pay | Admitting: Internal Medicine

## 2012-03-19 ENCOUNTER — Ambulatory Visit (INDEPENDENT_AMBULATORY_CARE_PROVIDER_SITE_OTHER): Payer: Medicare PPO | Admitting: Internal Medicine

## 2012-03-19 VITALS — BP 130/70 | HR 47 | Temp 97.4°F | Ht 63.0 in | Wt 175.0 lb

## 2012-03-19 DIAGNOSIS — E039 Hypothyroidism, unspecified: Secondary | ICD-10-CM

## 2012-03-19 DIAGNOSIS — Z0000 Encounter for general adult medical examination without abnormal findings: Secondary | ICD-10-CM | POA: Insufficient documentation

## 2012-03-19 DIAGNOSIS — I4891 Unspecified atrial fibrillation: Secondary | ICD-10-CM

## 2012-03-19 DIAGNOSIS — J45909 Unspecified asthma, uncomplicated: Secondary | ICD-10-CM

## 2012-03-19 DIAGNOSIS — E785 Hyperlipidemia, unspecified: Secondary | ICD-10-CM

## 2012-03-19 DIAGNOSIS — E119 Type 2 diabetes mellitus without complications: Secondary | ICD-10-CM

## 2012-03-19 DIAGNOSIS — Z23 Encounter for immunization: Secondary | ICD-10-CM

## 2012-03-19 DIAGNOSIS — J4530 Mild persistent asthma, uncomplicated: Secondary | ICD-10-CM

## 2012-03-19 DIAGNOSIS — Z1231 Encounter for screening mammogram for malignant neoplasm of breast: Secondary | ICD-10-CM

## 2012-03-19 NOTE — Progress Notes (Signed)
Subjective:    Patient ID: [name], female    DOB: [birth year], 78 y.o.   MRN: [id number]  HPI Here with husband For physical  Asthma has settled down with increased flovent No cough now  Checks sugars every morning Had been up when on the prednisone-- was over 200 briefly Usually 150-160 No hypoglycemic spells  No problems back on lipitor  Current Outpatient Prescriptions on File Prior to Visit  Medication Sig Dispense Refill  . albuterol (PROVENTIL HFA) 108 (90 BASE) MCG/ACT inhaler Inhale 2 puffs into the lungs every 6 (six) hours as needed for wheezing.  1 Inhaler  3  . ALPRAZolam (XANAX) 0.25 MG tablet Take 1 tablet (0.25 mg total) by mouth 2 (two) times daily as needed for sleep.  100 tablet  0  . aspirin 81 MG tablet Take 81 mg by mouth daily.        Marland Kitchen atorvastatin (LIPITOR) 20 MG tablet Take 1 tablet (20 mg total) by mouth daily.  30 tablet  11  . B-D UF III MINI PEN NEEDLES 31G X 5 MM MISC USE AS DIRECTED  100 each  12  . B-D ULTRA-FINE 33 LANCETS MISC USE ONE LANCET TWICE DAILY OR AS DIRECTED.  200 each  9  . calcitonin, salmon, (MIACALCIN/FORTICAL) 200 UNIT/ACT nasal spray INSTILL 1 SPRAY INTO THE NOSE EVERY DAY  3.7 mL  11  . CALCIUM-VITAMIN D PO Take 1 tablet by mouth daily.        . Chromium 100 MCG TABS Take by mouth daily.        . Coenzyme Q10 200 MG capsule Take 200 mg by mouth daily.        . fish oil-omega-3 fatty acids 1000 MG capsule Take 1 g by mouth daily.        . fluticasone (FLONASE) 50 MCG/ACT nasal spray Place 2 sprays into the nose daily.  16 g  11  . fluticasone (FLOVENT HFA) 110 MCG/ACT inhaler Inhale 2 puffs into the lungs daily.  1 Inhaler  12  . folic acid (FOLVITE) 1 MG tablet Take 1 mg by mouth daily.        Marland Kitchen glipiZIDE (GLUCOTROL XL) 10 MG 24 hr tablet TAKE TWO TABLETS BY MOUTH EVERY DAY  180 tablet  0  . insulin glargine (LANTUS SOLOSTAR) 100 UNIT/ML injection Inject 66 Units into the skin daily.  9 mL  3  . LANOXIN 0.25 MG tablet TAKE  ONE TABLET BY MOUTH EVERY DAY OR AS DIRECTED  60 each  2  . levothyroxine (SYNTHROID, LEVOTHROID) 88 MCG tablet TAKE 1 TABLET BY MOUTH EVERY DAY  90 tablet  0  . metoprolol succinate (TOPROL-XL) 25 MG 24 hr tablet Take 50 mg by mouth daily.       . Milk Thistle 500 MG CAPS Take by mouth daily.        . Multiple Vitamin (MULTIVITAMIN) capsule Take 1 capsule by mouth daily.        Marland Kitchen omeprazole (PRILOSEC) 20 MG capsule Take 1 capsule (20 mg total) by mouth daily.  90 capsule  3  . ONE TOUCH ULTRA TEST test strip USE AS DIRECTED  100 each  11  . TRAVEL SICKNESS 25 MG CHEW TAKE 1 TABLET BY MOUTH THREE TIMES DAILY AS NEEDED  90 tablet  0  . vitamin B-12 (CYANOCOBALAMIN) 100 MCG tablet Take 50 mcg by mouth daily.        . vitamin C (ASCORBIC ACID) 500 MG  tablet Take 500 mg by mouth daily.        . Vitamins C E (CRANBERRY CONCENTRATE) 100-3 MG-UNIT CAPS Take 2 capsules by mouth daily.          Allergies  Allergen Reactions  . Cefdinir     REACTION: unspecified  . Clarithromycin     REACTION: weird dreams (on prednisone at the same time but has tolerated this in the past)  . Metformin     REACTION: unspecified  . Miglitol     REACTION: unspecified  . Niacin     REACTION: unspecified    Past Medical History  Diagnosis Date  . Allergy   . Asthma   . Diabetes mellitus   . GERD (gastroesophageal reflux disease)   . Hyperlipidemia   . Thyroid disease   . Atrial fibrillation   . Osteopenia   . Sleep disorder     Past Surgical History  Procedure Date  . Cholecystectomy   . Strabismus surgery 1946 / 1967  . Vaginal delivery     x2  . Cervical cone biopsy 1960's  . Nasal polyp surgery 01/01    Family History  Problem Relation Age of Onset  . Asthma Mother   . Coronary artery disease Father     History   Social History  . Marital Status: Married    Spouse Name: N/A    Number of Children: 2  . Years of Education: N/A   Occupational History  .     Social History Main  Topics  . Smoking status: Never Smoker   . Smokeless tobacco: Never Used  . Alcohol Use: Yes  . Drug Use: Not on file  . Sexually Active: Not on file   Other Topics Concern  . Not on file   Social History Narrative  . No narrative on file   Review of Systems  Constitutional: Negative for fatigue and unexpected weight change.       Wears seat belt  HENT: Positive for hearing loss. Negative for congestion, rhinorrhea, dental problem and tinnitus.        Has appt with audiologist Regular with dentist  Eyes: Negative for visual disturbance.       No unilateral vision loss or diplopia Had left cataract taken off--due for right now No retinopathy  Respiratory: Negative for cough, chest tightness and shortness of breath.   Cardiovascular: Positive for palpitations. Negative for chest pain and leg swelling.       Rare palpitations  Gastrointestinal: Negative for nausea, vomiting, constipation and blood in stool.       Heartburn generally controlled  Genitourinary: Positive for frequency. Negative for dysuria, difficulty urinating and dyspareunia.  Musculoskeletal: Positive for back pain and arthralgias. Negative for joint swelling.       Back is better now!  Skin: Negative for rash.       Has some areas of concern  Neurological: Positive for dizziness and headaches. Negative for syncope and weakness.       Had "electric shock" in neck last night---may have slept with neck flexed to right. Very brief  Occ sinus headache--like on right this AM. Intermittent dizziness if she gets up at night  Hematological: Negative for adenopathy. Does not bruise/bleed easily.  Psychiatric/Behavioral: Negative for sleep disturbance and dysphoric mood. The patient is not nervous/anxious.        Objective:   Physical Exam  Constitutional: She is oriented to person, place, and time. She appears well-developed and well-nourished. No distress.  HENT:  Head: Normocephalic and atraumatic.  Right Ear:  External ear normal.  Left Ear: External ear normal.  Mouth/Throat: Oropharynx is clear and moist. No oropharyngeal exudate.  Eyes: Conjunctivae and EOM are normal. Pupils are equal, round, and reactive to light.  Neck: Normal range of motion. Neck supple. No thyromegaly present.  Cardiovascular: Normal rate, regular rhythm, normal heart sounds and intact distal pulses.  Exam reveals no gallop.   No murmur heard. Pulmonary/Chest: Effort normal and breath sounds normal. No respiratory distress. She has no wheezes. She has no rales.  Abdominal: Soft. There is no tenderness.  Genitourinary:       Mild cystic changes in both breasts No mass  Musculoskeletal: She exhibits no edema and no tenderness.  Lymphadenopathy:    She has no cervical adenopathy.    She has no axillary adenopathy.  Neurological: She is alert and oriented to person, place, and time.  Skin: No rash noted. No erythema.  Psychiatric: She has a normal mood and affect. Her behavior is normal.          Assessment & Plan:

## 2012-03-19 NOTE — Assessment & Plan Note (Signed)
Lab Results  Component Value Date   HGBA1C 8.4* 11/21/2011   Seems slightly better Will titrate lantus slightly if goes up

## 2012-03-19 NOTE — Assessment & Plan Note (Signed)
Doing well Td updated Will set up mammogram---discussed considering stopping in the next 1-2 years

## 2012-03-19 NOTE — Assessment & Plan Note (Signed)
Back on lipitor Will check labs

## 2012-03-19 NOTE — Assessment & Plan Note (Signed)
Much better on higher flovent

## 2012-03-19 NOTE — Assessment & Plan Note (Signed)
Seems to still be in sinus Check dig level

## 2012-03-20 LAB — HEPATIC FUNCTION PANEL
ALT: 29 U/L (ref 0–35)
Bilirubin, Direct: 0.1 mg/dL (ref 0.0–0.3)
Total Bilirubin: 0.6 mg/dL (ref 0.3–1.2)

## 2012-03-20 LAB — CBC WITH DIFFERENTIAL/PLATELET
Basophils Relative: 0.4 % (ref 0.0–3.0)
Eosinophils Relative: 7.2 % — ABNORMAL HIGH (ref 0.0–5.0)
HCT: 39.3 % (ref 36.0–46.0)
Lymphs Abs: 2.2 10*3/uL (ref 0.7–4.0)
MCV: 89.7 fl (ref 78.0–100.0)
Monocytes Absolute: 0.7 10*3/uL (ref 0.1–1.0)
Monocytes Relative: 11.1 % (ref 3.0–12.0)
RBC: 4.38 Mil/uL (ref 3.87–5.11)
WBC: 6.3 10*3/uL (ref 4.5–10.5)

## 2012-03-20 LAB — MICROALBUMIN / CREATININE URINE RATIO
Creatinine,U: 370.1 mg/dL
Microalb, Ur: 5.2 mg/dL — ABNORMAL HIGH (ref 0.0–1.9)

## 2012-03-20 LAB — BASIC METABOLIC PANEL
Chloride: 103 mEq/L (ref 96–112)
GFR: 66.73 mL/min (ref >60.00)
Potassium: 5.1 mEq/L (ref 3.5–5.1)
Sodium: 139 mEq/L (ref 135–145)

## 2012-03-20 LAB — TSH: TSH: 2.69 u[IU]/mL (ref 0.35–5.50)

## 2012-03-20 LAB — LDL CHOLESTEROL, DIRECT: Direct LDL: 55.5 mg/dL

## 2012-03-20 LAB — LIPID PANEL
Cholesterol: 122 mg/dL (ref 0–200)
Total CHOL/HDL Ratio: 3
VLDL: 43 mg/dL — ABNORMAL HIGH (ref 0.0–40.0)

## 2012-03-20 LAB — DIGOXIN LEVEL: Digoxin Level: 0.8 ng/mL (ref 0.8–2.0)

## 2012-03-20 LAB — T4, FREE: Free T4: 1.12 ng/dL (ref 0.60–1.60)

## 2012-03-22 ENCOUNTER — Encounter: Payer: Self-pay | Admitting: *Deleted

## 2012-04-18 ENCOUNTER — Telehealth: Payer: Self-pay

## 2012-04-18 MED ORDER — EPINEPHRINE 0.3 MG/0.3ML IJ DEVI: 0.3000 mg | Freq: Once | INTRAMUSCULAR | Status: DC

## 2012-04-18 NOTE — Telephone Encounter (Signed)
Pt request refill for 2 epipens. (keeps one in each car). Not on med list. [facility] [address]. Would like to pick up pens from pharmacy today.Please advise.

## 2012-04-18 NOTE — Telephone Encounter (Signed)
rx sent to pharmacy by e-script, spoke with pt she uses it for bad asthma attack.

## 2012-04-18 NOTE — Telephone Encounter (Signed)
Okay to refill #2 x 1 Please document what she uses them for---what her allergy is

## 2012-04-19 NOTE — Telephone Encounter (Signed)
I will discuss this with her at her next visit Not the best Rx at her age but still acceptable

## 2012-04-20 ENCOUNTER — Ambulatory Visit: Payer: Medicare PPO | Admitting: Internal Medicine

## 2012-05-04 ENCOUNTER — Other Ambulatory Visit: Payer: Self-pay | Admitting: Internal Medicine

## 2012-05-08 ENCOUNTER — Ambulatory Visit: Payer: Self-pay | Admitting: Internal Medicine

## 2012-05-11 ENCOUNTER — Encounter: Payer: Self-pay | Admitting: Internal Medicine

## 2012-05-14 ENCOUNTER — Other Ambulatory Visit: Payer: Self-pay | Admitting: Internal Medicine

## 2012-05-14 ENCOUNTER — Encounter: Payer: Self-pay | Admitting: *Deleted

## 2012-05-14 NOTE — Telephone Encounter (Signed)
rx called into pharmacy

## 2012-05-14 NOTE — Telephone Encounter (Signed)
Okay #100 x 0 

## 2012-05-20 ENCOUNTER — Other Ambulatory Visit: Payer: Self-pay | Admitting: Internal Medicine

## 2012-06-01 ENCOUNTER — Other Ambulatory Visit: Payer: Self-pay | Admitting: *Deleted

## 2012-06-01 MED ORDER — MECLIZINE HCL 25 MG PO CHEW: 1.0000 | CHEWABLE_TABLET | Freq: Three times a day (TID) | ORAL | Status: DC | PRN

## 2012-06-04 ENCOUNTER — Telehealth: Payer: Self-pay

## 2012-06-04 MED ORDER — LANOXIN 250 MCG PO TABS: 0.1250 mg | ORAL_TABLET | Freq: Every day | ORAL | Status: DC

## 2012-06-04 NOTE — Telephone Encounter (Signed)
plz notify pt and pharmacy that i've sent in 1/2 pill dose.  Will forward to Dr. Elbert Ewings as Lorain Childes

## 2012-06-04 NOTE — Telephone Encounter (Signed)
Revea pharmacist with [facility] wants to clarify instructions for Lanoxin 0.25mg . Pt states she has been taking 1/2 tab by mouth daily. Med list  Has one tab by mouth every day or as directed. [facility] needs correct instructions to [facility] with refills. [facility] needs call back also.

## 2012-06-04 NOTE — Telephone Encounter (Signed)
Pt left v/m that [employer] had called pt about Lanoxin and pt was not sure why [employer] called. Pt said doctor in [place] told pt to take Lanoxin 0.25 mg 1/2 tablet daily and pt would like to continue taking med this way for 14 years. Pt would like call back at [phone].

## 2012-06-05 NOTE — Telephone Encounter (Signed)
.  left message to have patient return my call. Spoke with pharmacist at [facility] and advised rx corrected.

## 2012-06-05 NOTE — Telephone Encounter (Signed)
Spoke with patient and advised results   

## 2012-06-10 NOTE — Telephone Encounter (Signed)
Okay 

## 2012-08-21 ENCOUNTER — Ambulatory Visit: Payer: Medicare PPO

## 2012-08-27 ENCOUNTER — Other Ambulatory Visit: Payer: Self-pay | Admitting: Internal Medicine

## 2012-09-10 ENCOUNTER — Ambulatory Visit (INDEPENDENT_AMBULATORY_CARE_PROVIDER_SITE_OTHER): Payer: Medicare PPO | Admitting: Internal Medicine

## 2012-09-10 ENCOUNTER — Encounter: Payer: Self-pay | Admitting: Internal Medicine

## 2012-09-10 VITALS — BP 134/60 | HR 92 | Temp 98.6°F | Wt 171.0 lb

## 2012-09-10 DIAGNOSIS — IMO0001 Reserved for inherently not codable concepts without codable children: Secondary | ICD-10-CM

## 2012-09-10 NOTE — Assessment & Plan Note (Signed)
Now seeing Dr Renae Fickle at [facility] on metformin and seems to be tolerating this---had been on her allergy list for initial side effects when she was on this years ago Discussed the victoza--- I told them it was reasonable to try this Counseled almost all of 15 minute visit

## 2012-09-10 NOTE — Progress Notes (Signed)
Subjective:    Patient ID: [name], female    DOB: 11-28-1930, 78 y.o.   MRN: [id number]  HPI Here with husband  Decided to see endocrinologist after high A1c Saw Dr Elenore Paddy the glipizide and cut the lantus dose down Started metformin --- noted palpitation years ago when I had tried her on that. Started slowly on this and has titrated it up Needs antidiarrhea pill at times Rx for victoza---hasn't started this yet  Still sees Dr Jenne Campus regularly about her sinuses  Some allergy problems Takes zyrtec as needed  Current Outpatient Prescriptions on File Prior to Visit  Medication Sig Dispense Refill  . ALPRAZolam (XANAX) 0.25 MG tablet TAKE 1 TABLET BY MOUTH TWICE DAILY AS NEEDED FOR SLEEP  100 tablet  0  . aspirin 81 MG tablet Take 81 mg by mouth daily.        Marland Kitchen atorvastatin (LIPITOR) 20 MG tablet Take 1 tablet (20 mg total) by mouth daily.  30 tablet  11  . B-D UF III MINI PEN NEEDLES 31G X 5 MM MISC USE AS DIRECTED  100 each  12  . B-D ULTRA-FINE 33 LANCETS MISC USE ONE LANCET TWICE DAILY OR AS DIRECTED.  200 each  9  . calcitonin, salmon, (MIACALCIN/FORTICAL) 200 UNIT/ACT nasal spray INSTILL 1 SPRAY INTO THE NOSE EVERY DAY  3.7 mL  11  . CALCIUM-VITAMIN D PO Take 1 tablet by mouth daily.        . Chromium 100 MCG TABS Take by mouth daily.        . Coenzyme Q10 200 MG capsule Take 200 mg by mouth daily.        Marland Kitchen EPINEPHrine (EPI-PEN) 0.3 mg/0.3 mL DEVI Inject 0.3 mLs (0.3 mg total) into the muscle once.  2 Device  1  . fish oil-omega-3 fatty acids 1000 MG capsule Take 1 g by mouth daily.        . fluticasone (FLONASE) 50 MCG/ACT nasal spray Place 2 sprays into the nose daily.  16 g  11  . fluticasone (FLOVENT HFA) 110 MCG/ACT inhaler Inhale 2 puffs into the lungs daily.  1 Inhaler  12  . folic acid (FOLVITE) 1 MG tablet Take 1 mg by mouth daily.        Marland Kitchen LANOXIN 0.25 MG tablet Take 0.5 tablets (0.125 mg total) by mouth daily.  60 each  3  . levothyroxine (SYNTHROID,  LEVOTHROID) 88 MCG tablet TAKE 1 TABLET BY MOUTH EVERY DAY  90 tablet  0  . metFORMIN (GLUCOPHAGE) 500 MG tablet Take 1,000 mg by mouth 2 (two) times daily with a meal.       . metoprolol succinate (TOPROL-XL) 25 MG 24 hr tablet Take 50 mg by mouth daily.       . Milk Thistle 500 MG CAPS Take by mouth daily.        . Multiple Vitamin (MULTIVITAMIN) capsule Take 1 capsule by mouth daily.        Marland Kitchen omeprazole (PRILOSEC) 20 MG capsule Take 1 capsule (20 mg total) by mouth daily.  90 capsule  3  . ONE TOUCH ULTRA TEST test strip TEST AS DIRECTED  100 each  11  . TRAVEL SICKNESS 25 MG CHEW TAKE 1 TABLET BY MOUTH THREE TIMES DAILY AS NEEDED  90 tablet  0  . vitamin B-12 (CYANOCOBALAMIN) 100 MCG tablet Take 50 mcg by mouth daily.        . vitamin C (ASCORBIC ACID) 500 MG  tablet Take 500 mg by mouth daily.        . Vitamins C E (CRANBERRY CONCENTRATE) 100-3 MG-UNIT CAPS Take 2 capsules by mouth daily.        Marland Kitchen DISCONTD: albuterol (PROVENTIL HFA) 108 (90 BASE) MCG/ACT inhaler Inhale 2 puffs into the lungs every 6 (six) hours as needed for wheezing.  1 Inhaler  3  . DISCONTD: insulin glargine (LANTUS SOLOSTAR) 100 UNIT/ML injection Inject 66 Units into the skin daily.  9 mL  3    Allergies  Allergen Reactions  . Cefdinir     REACTION: unspecified  . Clarithromycin     REACTION: weird dreams (on prednisone at the same time but has tolerated this in the past)  . Miglitol     REACTION: unspecified  . Niacin     REACTION: unspecified    Past Medical History  Diagnosis Date  . Allergy   . Asthma   . Diabetes mellitus   . GERD (gastroesophageal reflux disease)   . Hyperlipidemia   . Thyroid disease   . Atrial fibrillation   . Osteopenia   . Sleep disorder     Past Surgical History  Procedure Date  . Cholecystectomy   . Strabismus surgery 1946 / 1967  . Vaginal delivery     x2  . Cervical cone biopsy 1960's  . Nasal polyp surgery 01/01    Family History  Problem Relation Age of Onset   . Asthma Mother   . Coronary artery disease Father     History   Social History  . Marital Status: Married    Spouse Name: N/A    Number of Children: 2  . Years of Education: N/A   Occupational History  .     Social History Main Topics  . Smoking status: Never Smoker   . Smokeless tobacco: Never Used  . Alcohol Use: Yes  . Drug Use: Not on file  . Sexually Active: Not on file   Other Topics Concern  . Not on file   Social History Narrative  . No narrative on file   Review of Systems Weight down a few pounds No chest pain    Objective:   Physical Exam  Constitutional: She appears well-developed and well-nourished.  Psychiatric: She has a normal mood and affect. Her behavior is normal.          Assessment & Plan:

## 2012-09-24 ENCOUNTER — Ambulatory Visit: Payer: Medicare PPO | Admitting: Internal Medicine

## 2012-10-08 ENCOUNTER — Encounter: Payer: Self-pay | Admitting: Internal Medicine

## 2012-10-08 ENCOUNTER — Ambulatory Visit (INDEPENDENT_AMBULATORY_CARE_PROVIDER_SITE_OTHER): Payer: Medicare PPO | Admitting: Internal Medicine

## 2012-10-08 VITALS — BP 140/70 | HR 77 | Temp 98.0°F | Resp 12 | Wt 166.0 lb

## 2012-10-08 DIAGNOSIS — J45901 Unspecified asthma with (acute) exacerbation: Secondary | ICD-10-CM

## 2012-10-08 DIAGNOSIS — J019 Acute sinusitis, unspecified: Secondary | ICD-10-CM

## 2012-10-08 MED ORDER — PREDNISONE 20 MG PO TABS: 40.0000 mg | ORAL_TABLET | Freq: Every day | ORAL | Status: DC

## 2012-10-08 MED ORDER — ALBUTEROL SULFATE HFA 108 (90 BASE) MCG/ACT IN AERS: 2.0000 | INHALATION_SPRAY | Freq: Four times a day (QID) | RESPIRATORY_TRACT | Status: DC | PRN

## 2012-10-08 MED ORDER — AMOXICILLIN-POT CLAVULANATE 875-125 MG PO TABS: 1.0000 | ORAL_TABLET | Freq: Two times a day (BID) | ORAL | Status: DC

## 2012-10-08 NOTE — Progress Notes (Signed)
Subjective:    Patient ID: [name], female    DOB: Oct 30, 1931, 78 y.o.   MRN: [id number]  HPI Having a cough for about 4 days Was smelling various perfumes for daughter, out at night while shopping Came out with paroxysms of sneezing then and progressed May have had some earlier chest congestion also  Has been taking zyrtec, flonase, inhalers  Still just feels stuck in chest Using flovent every night--not consistent with this when she feels okay though  No fever but feels hot at times Has felt SOB twice --then the albuterol helps Has had some yellow nasal drainage ?some sputum with cough also  Current Outpatient Prescriptions on File Prior to Visit  Medication Sig Dispense Refill  . albuterol (PROVENTIL HFA;VENTOLIN HFA) 108 (90 BASE) MCG/ACT inhaler Inhale 2 puffs into the lungs every 6 (six) hours as needed.      . ALPRAZolam (XANAX) 0.25 MG tablet TAKE 1 TABLET BY MOUTH TWICE DAILY AS NEEDED FOR SLEEP  100 tablet  0  . aspirin 81 MG tablet Take 81 mg by mouth daily.        Marland Kitchen atorvastatin (LIPITOR) 20 MG tablet Take 1 tablet (20 mg total) by mouth daily.  30 tablet  11  . B-D UF III MINI PEN NEEDLES 31G X 5 MM MISC USE AS DIRECTED  100 each  12  . B-D ULTRA-FINE 33 LANCETS MISC USE ONE LANCET TWICE DAILY OR AS DIRECTED.  200 each  9  . calcitonin, salmon, (MIACALCIN/FORTICAL) 200 UNIT/ACT nasal spray INSTILL 1 SPRAY INTO THE NOSE EVERY DAY  3.7 mL  11  . CALCIUM-VITAMIN D PO Take 1 tablet by mouth daily.        . Chromium 100 MCG TABS Take by mouth daily.        . Coenzyme Q10 200 MG capsule Take 200 mg by mouth daily.        Marland Kitchen EPINEPHrine (EPI-PEN) 0.3 mg/0.3 mL DEVI Inject 0.3 mLs (0.3 mg total) into the muscle once.  2 Device  1  . fish oil-omega-3 fatty acids 1000 MG capsule Take 1 g by mouth daily.        . fluticasone (FLONASE) 50 MCG/ACT nasal spray Place 2 sprays into the nose daily.  16 g  11  . fluticasone (FLOVENT HFA) 110 MCG/ACT inhaler Inhale 2 puffs into the  lungs daily.  1 Inhaler  12  . folic acid (FOLVITE) 1 MG tablet Take 1 mg by mouth daily.        . insulin glargine (LANTUS) 100 UNIT/ML injection Inject 20 Units into the skin 2 (two) times daily.      Marland Kitchen LANOXIN 0.25 MG tablet Take 0.5 tablets (0.125 mg total) by mouth daily.  60 each  3  . levothyroxine (SYNTHROID, LEVOTHROID) 88 MCG tablet TAKE 1 TABLET BY MOUTH EVERY DAY  90 tablet  0  . metFORMIN (GLUCOPHAGE) 500 MG tablet Take 1,000 mg by mouth 2 (two) times daily with a meal.       . metoprolol succinate (TOPROL-XL) 25 MG 24 hr tablet Take 50 mg by mouth daily.       . Milk Thistle 500 MG CAPS Take by mouth daily.        . Multiple Vitamin (MULTIVITAMIN) capsule Take 1 capsule by mouth daily.        Marland Kitchen omeprazole (PRILOSEC) 20 MG capsule Take 1 capsule (20 mg total) by mouth daily.  90 capsule  3  . ONE  TOUCH ULTRA TEST test strip TEST AS DIRECTED  100 each  11  . TRAVEL SICKNESS 25 MG CHEW TAKE 1 TABLET BY MOUTH THREE TIMES DAILY AS NEEDED  90 tablet  0  . vitamin B-12 (CYANOCOBALAMIN) 100 MCG tablet Take 50 mcg by mouth daily.        . vitamin C (ASCORBIC ACID) 500 MG tablet Take 500 mg by mouth daily.        . Vitamins C E (CRANBERRY CONCENTRATE) 100-3 MG-UNIT CAPS Take 2 capsules by mouth daily.          Allergies  Allergen Reactions  . Cefdinir     REACTION: unspecified  . Clarithromycin     REACTION: weird dreams (on prednisone at the same time but has tolerated this in the past)  . Miglitol     REACTION: unspecified  . Niacin     REACTION: unspecified    Past Medical History  Diagnosis Date  . Allergy   . Asthma   . Diabetes mellitus   . GERD (gastroesophageal reflux disease)   . Hyperlipidemia   . Thyroid disease   . Atrial fibrillation   . Osteopenia   . Sleep disorder     Past Surgical History  Procedure Date  . Cholecystectomy   . Strabismus surgery 1946 / 1967  . Vaginal delivery     x2  . Cervical cone biopsy 1960's  . Nasal polyp surgery 01/01     Family History  Problem Relation Age of Onset  . Asthma Mother   . Coronary artery disease Father     History   Social History  . Marital Status: Married    Spouse Name: N/A    Number of Children: 2  . Years of Education: N/A   Occupational History  .     Social History Main Topics  . Smoking status: Never Smoker   . Smokeless tobacco: Never Used  . Alcohol Use: Yes  . Drug Use: Not on file  . Sexually Active: Not on file   Other Topics Concern  . Not on file   Social History Narrative  . No narrative on file   Review of Systems Drinking lots of fluids Eating okay No rash No vomiting or nausea now. Did have vomiting from victoza Is getting loose stools from the metformin---takes immodium to counteract     Objective:   Physical Exam  Constitutional: She appears well-developed and well-nourished. No distress.  HENT:  Mouth/Throat: Oropharynx is clear and moist. No oropharyngeal exudate.       Some maxillary tenderness Marked nasal swelling and mild inflammation. Left nare occluded completely TMs normal  Neck: Normal range of motion. Neck supple.  Pulmonary/Chest: Effort normal. No respiratory distress. She has wheezes. She has no rales.       Mild prolonged exp phase and exp wheezing  Musculoskeletal: She exhibits no edema.  Lymphadenopathy:    She has no cervical adenopathy.  Psychiatric: She has a normal mood and affect. Her behavior is normal.          Assessment & Plan:

## 2012-10-08 NOTE — Patient Instructions (Signed)
Please take the prednisone now. Refill it and take it only 20mg  per day if you worsen again after stopping the 5 day course Please fill and take the augmentin if you worsen with sinus drainage and cough

## 2012-10-08 NOTE — Assessment & Plan Note (Signed)
This may be viral or even related to perfume irritation Will give Rx for augmentin to take if she worsens

## 2012-10-08 NOTE — Assessment & Plan Note (Signed)
Mild now but persistent cough and some dyspnea Brief help with albuterol Will add prednisone for 5 days Continue flovent on ongoing basis

## 2012-10-17 ENCOUNTER — Other Ambulatory Visit: Payer: Self-pay | Admitting: *Deleted

## 2012-10-17 MED ORDER — OMEPRAZOLE 20 MG PO CPDR: 20.0000 mg | DELAYED_RELEASE_CAPSULE | Freq: Every day | ORAL | Status: DC

## 2012-10-17 MED ORDER — LEVOTHYROXINE SODIUM 88 MCG PO TABS: ORAL_TABLET | ORAL | Status: DC

## 2012-10-30 ENCOUNTER — Other Ambulatory Visit: Payer: Self-pay | Admitting: Internal Medicine

## 2012-10-30 NOTE — Telephone Encounter (Signed)
Patient Information:  Caller Name: Jonny Ruiz  Phone: [phone]  Patient: [name], [name]  Gender: Female  DOB: 1931-01-05  Age: 78 Years  PCP: [facility])  Office Follow Up:  Does the office need to follow up with this patient?: Yes  Instructions For The Office: Advised  office visit due to persistent cough but husband states MD knows how patient is difficult and he thinks she needs additional Prednisone. Refused appointment and wants MD to decide if will call in Prednisone.   Symptoms  Reason For Call & Symptoms: Was seen in office "4-5 weeks ago due to asthma symptoms. Was given script for antibiotic and told to fill if symptoms returned. Filled script and began taking 12-16 but is not improving. Cough improved after taking Prednisone that was prescribed when in office 11-25. Has worsened again 12-16. Is non productive cough. No wheezing.  Reviewed Health History In EMR: Yes  Reviewed Medications In EMR: Yes  Reviewed Allergies In EMR: Yes  Reviewed Surgeries / Procedures: Yes  Date of Onset of Symptoms: 10/08/2012  Treatments Tried: Albuterol inhaler  Treatments Tried Worked: No  Guideline(s) Used:  Cough  Asthma Attack  Disposition Per Guideline:   See Today or Tomorrow in Office  Reason For Disposition Reached:   Mild asthma attack (e.g., no SOB at rest, mild SOB with walking, speaks normally in sentences, mild wheezing) and persists > 24 hours on appropriate treatment  Advice Given:  Quick-Relief Asthma Medicine:   Start your quick-relief medicine (e.g., albuterol, salbutamol) at the first sign of any coughing or shortness of breath (don't wait for wheezing). Use your inhaler (2 puffs each time) or nebulizer every 4 hours. Continue the quick-relief medicine until you have not wheezed or coughed for 48 hours.  The best "cough medicine" for an adult with asthma is always the asthma medicine (Note: Don't use cough suppressants, but cough drops may help a  tickly cough).  Drinking Liquids:  Try to drink normal amount of liquids (e.g., water). Being adequately hydrated makes it easier to cough up the sticky lung mucus.  Patient Refused Recommendation:  Patient Requests Prescription  Wants script for Prednisone

## 2012-10-31 MED ORDER — PREDNISONE 20 MG PO TABS: ORAL_TABLET | ORAL | Status: DC

## 2012-10-31 NOTE — Telephone Encounter (Signed)
rx sent to pharmacy by e-script Left message on machine with results, advised to call if any problems

## 2012-10-31 NOTE — Telephone Encounter (Signed)
As long as she is just having a cough but no sig SOB, can send Rx for prednisone again 20mg  2 daily for 5 days, then 1 daily for 5 days #15 x 0 See if persistent symptoms after that

## 2012-11-22 ENCOUNTER — Telehealth: Payer: Self-pay

## 2012-11-22 NOTE — Telephone Encounter (Signed)
pts husband left v/m that pt had stepped on sharp object; cleaned area and it was not hurting and no sign of infection and wanted to know last tetanus shot.  I called and spoke with Mr. Morgan and he had already spoken with [facility] and found out tetanus given 03/19/12. Mr Melcher said if pt develops sign of infection will call back.

## 2012-11-22 NOTE — Telephone Encounter (Signed)
Okay 

## 2012-11-27 ENCOUNTER — Other Ambulatory Visit: Payer: Self-pay | Admitting: Internal Medicine

## 2012-11-28 ENCOUNTER — Other Ambulatory Visit: Payer: Self-pay | Admitting: Internal Medicine

## 2012-11-28 NOTE — Telephone Encounter (Signed)
Okay #100 x 0 

## 2012-11-28 NOTE — Telephone Encounter (Signed)
rx called into pharmacy

## 2012-12-08 ENCOUNTER — Other Ambulatory Visit: Payer: Self-pay | Admitting: Internal Medicine

## 2012-12-19 ENCOUNTER — Other Ambulatory Visit: Payer: Self-pay | Admitting: *Deleted

## 2012-12-19 MED ORDER — ATORVASTATIN CALCIUM 20 MG PO TABS: 20.0000 mg | ORAL_TABLET | Freq: Every day | ORAL | Status: DC

## 2012-12-20 ENCOUNTER — Other Ambulatory Visit: Payer: Self-pay | Admitting: Internal Medicine

## 2012-12-26 ENCOUNTER — Other Ambulatory Visit: Payer: Self-pay | Admitting: *Deleted

## 2012-12-26 ENCOUNTER — Other Ambulatory Visit: Payer: Self-pay | Admitting: Internal Medicine

## 2012-12-26 MED ORDER — CALCITONIN (SALMON) 200 UNIT/ACT NA SOLN: NASAL | Status: DC

## 2013-01-15 ENCOUNTER — Other Ambulatory Visit: Payer: Self-pay | Admitting: Internal Medicine

## 2013-02-11 ENCOUNTER — Ambulatory Visit (INDEPENDENT_AMBULATORY_CARE_PROVIDER_SITE_OTHER): Payer: Medicare PPO | Admitting: Internal Medicine

## 2013-02-11 ENCOUNTER — Encounter: Payer: Self-pay | Admitting: Internal Medicine

## 2013-02-11 VITALS — BP 148/80 | HR 81 | Temp 98.6°F | Resp 16 | Wt 160.0 lb

## 2013-02-11 DIAGNOSIS — J45901 Unspecified asthma with (acute) exacerbation: Secondary | ICD-10-CM

## 2013-02-11 DIAGNOSIS — J209 Acute bronchitis, unspecified: Secondary | ICD-10-CM | POA: Insufficient documentation

## 2013-02-11 MED ORDER — ALBUTEROL SULFATE HFA 108 (90 BASE) MCG/ACT IN AERS: 2.0000 | INHALATION_SPRAY | Freq: Four times a day (QID) | RESPIRATORY_TRACT | Status: DC | PRN

## 2013-02-11 MED ORDER — FLUTICASONE PROPIONATE HFA 110 MCG/ACT IN AERO: 2.0000 | INHALATION_SPRAY | Freq: Every day | RESPIRATORY_TRACT | Status: DC

## 2013-02-11 MED ORDER — MONTELUKAST SODIUM 10 MG PO TABS: 10.0000 mg | ORAL_TABLET | Freq: Every day | ORAL | Status: DC

## 2013-02-11 MED ORDER — AMOXICILLIN-POT CLAVULANATE 875-125 MG PO TABS: 1.0000 | ORAL_TABLET | Freq: Two times a day (BID) | ORAL | Status: DC

## 2013-02-11 MED ORDER — PREDNISONE 20 MG PO TABS: 40.0000 mg | ORAL_TABLET | Freq: Every day | ORAL | Status: DC

## 2013-02-11 NOTE — Assessment & Plan Note (Signed)
May be infectious ---though could be viral With asthma flare, will send Rx for augmentin Discussed that she can wait and may not need it

## 2013-02-11 NOTE — Progress Notes (Signed)
Subjective:    Patient ID: [name], female    DOB: Oct 04, 1931, 79 y.o.   MRN: [id number]  HPI Having cough again Thinks it may be her allergies Has wheezing now and is concerned about infection  Has not been taking the flovent everyday Thinks it makes her BP go up and gives flushed cheeks Albuterol inhaler hasn't helped  ?low grade fever---feels hot inside Not really SOB Some sputum today---pale yellow No sore throat  No ear pain  Current Outpatient Prescriptions on File Prior to Visit  Medication Sig Dispense Refill  . ALPRAZolam (XANAX) 0.25 MG tablet TAKE 1 TABLET BY MOUTH TWICE DAILY AS NEEDED FOR SLEEP  100 tablet  0  . aspirin 81 MG tablet Take 81 mg by mouth daily.        Marland Kitchen atorvastatin (LIPITOR) 20 MG tablet Take 1 tablet (20 mg total) by mouth daily.  30 tablet  5  . B-D UF III MINI PEN NEEDLES 31G X 5 MM MISC USE AS DIRECTED  100 each  0  . B-D ULTRA-FINE 33 LANCETS MISC USE TWICE DAILY OR AS DIRECTED  100 each  6  . calcitonin, salmon, (MIACALCIN/FORTICAL) 200 UNIT/ACT nasal spray INSTILL 1 SPRAY INTO THE NOSE EVERY DAY  3.7 mL  2  . CALCIUM-VITAMIN D PO Take 1 tablet by mouth daily.        . Chromium 100 MCG TABS Take by mouth daily.        . Coenzyme Q10 200 MG capsule Take 200 mg by mouth daily.        Marland Kitchen EPINEPHrine (EPI-PEN) 0.3 mg/0.3 mL DEVI Inject 0.3 mLs (0.3 mg total) into the muscle once.  2 Device  1  . fish oil-omega-3 fatty acids 1000 MG capsule Take 1 g by mouth daily.        . folic acid (FOLVITE) 1 MG tablet Take 1 mg by mouth daily.        Marland Kitchen LANOXIN 0.25 MG tablet Take 0.5 tablets (0.125 mg total) by mouth daily.  60 each  3  . LANTUS SOLOSTAR 100 UNIT/ML injection INJECT 66 UNITS UNDER THE SKIN EVERY DAY  30 mL  0  . levothyroxine (SYNTHROID, LEVOTHROID) 88 MCG tablet TAKE 1 TABLET BY MOUTH EVERY DAY  90 tablet  1  . metFORMIN (GLUCOPHAGE) 500 MG tablet Take 1,000 mg by mouth 2 (two) times daily with a meal.       . metoprolol succinate  (TOPROL-XL) 25 MG 24 hr tablet TAKE 1 TABLET BY MOUTH EVERY DAY  90 tablet  3  . Milk Thistle 500 MG CAPS Take by mouth daily.        . Multiple Vitamin (MULTIVITAMIN) capsule Take 1 capsule by mouth daily.        Marland Kitchen omeprazole (PRILOSEC) 20 MG capsule Take 1 capsule (20 mg total) by mouth daily.  90 capsule  1  . ONE TOUCH ULTRA TEST test strip TEST AS DIRECTED  100 each  11  . TRAVEL SICKNESS 25 MG CHEW TAKE 1 TABLET BY MOUTH THREE TIMES DAILY AS NEEDED  90 tablet  0  . vitamin B-12 (CYANOCOBALAMIN) 100 MCG tablet Take 50 mcg by mouth daily.        . vitamin C (ASCORBIC ACID) 500 MG tablet Take 500 mg by mouth daily.        . Vitamins C E (CRANBERRY CONCENTRATE) 100-3 MG-UNIT CAPS Take 2 capsules by mouth daily.  No current facility-administered medications on file prior to visit.    Allergies  Allergen Reactions  . Cefdinir     REACTION: unspecified  . Clarithromycin     REACTION: weird dreams (on prednisone at the same time but has tolerated this in the past)  . Miglitol     REACTION: unspecified  . Niacin     REACTION: unspecified    Past Medical History  Diagnosis Date  . Allergy   . Asthma   . Diabetes mellitus   . GERD (gastroesophageal reflux disease)   . Hyperlipidemia   . Thyroid disease   . Atrial fibrillation   . Osteopenia   . Sleep disorder     Past Surgical History  Procedure Laterality Date  . Cholecystectomy    . Strabismus surgery  1946 / 1967  . Vaginal delivery      x2  . Cervical cone biopsy  1960's  . Nasal polyp surgery  01/01    Family History  Problem Relation Age of Onset  . Asthma Mother   . Coronary artery disease Father     History   Social History  . Marital Status: Married    Spouse Name: N/A    Number of Children: 2  . Years of Education: N/A   Occupational History  .     Social History Main Topics  . Smoking status: Never Smoker   . Smokeless tobacco: Never Used  . Alcohol Use: Yes  . Drug Use: Not on file   . Sexually Active: Not on file   Other Topics Concern  . Not on file   Social History Narrative  . No narrative on file   Review of Systems No vomiting  Gets loose stools with metformin--has to use imodium (Dr Renae Fickle aware) Appetite is off some Has lost 6# since last visit     Objective:   Physical Exam  Constitutional: She appears well-developed and well-nourished. No distress.  HENT:  Mouth/Throat: Oropharynx is clear and moist. No oropharyngeal exudate.  TMs normal Moderate nasal inflammation  Neck: Normal range of motion. Neck supple.  Pulmonary/Chest: Effort normal. No respiratory distress. She has wheezes. She has no rales.  Fair air movement Mild expiratory wheezing and mild prolonged exp phase  Musculoskeletal: She exhibits no edema.  Lymphadenopathy:    She has no cervical adenopathy.  Psychiatric: She has a normal mood and affect. Her behavior is normal.          Assessment & Plan:

## 2013-02-11 NOTE — Assessment & Plan Note (Signed)
Mild  May have been triggered by the tree pollens Will try prednisone for 10 days Has not tolerated the flovent---will try singulair for the allergies and asthma

## 2013-02-11 NOTE — Patient Instructions (Signed)
Please take the prednisone now and start the montelukast every evening. If your asthma is quiet on this new medicine, we will stop the flovent that you haven't tolerated well Please start the augmentin if you worsen--- I am not sure you need it yet

## 2013-02-19 ENCOUNTER — Other Ambulatory Visit: Payer: Self-pay | Admitting: Internal Medicine

## 2013-02-21 ENCOUNTER — Other Ambulatory Visit: Payer: Self-pay | Admitting: Internal Medicine

## 2013-04-10 ENCOUNTER — Other Ambulatory Visit: Payer: Self-pay | Admitting: Internal Medicine

## 2013-04-12 ENCOUNTER — Ambulatory Visit (INDEPENDENT_AMBULATORY_CARE_PROVIDER_SITE_OTHER): Payer: Medicare PPO | Admitting: Internal Medicine

## 2013-04-12 ENCOUNTER — Encounter: Payer: Self-pay | Admitting: Internal Medicine

## 2013-04-12 VITALS — BP 160/80 | HR 80 | Temp 98.2°F | Wt 156.0 lb

## 2013-04-12 DIAGNOSIS — J45901 Unspecified asthma with (acute) exacerbation: Secondary | ICD-10-CM

## 2013-04-12 DIAGNOSIS — J4521 Mild intermittent asthma with (acute) exacerbation: Secondary | ICD-10-CM

## 2013-04-12 MED ORDER — PREDNISONE 20 MG PO TABS: 40.0000 mg | ORAL_TABLET | Freq: Every day | ORAL | Status: DC

## 2013-04-12 NOTE — Patient Instructions (Signed)
Please take the montelukast (singulair) EVERY NIGHT. If your asthma still flares, you will need to restart the flovent.

## 2013-04-12 NOTE — Progress Notes (Signed)
Subjective:    Patient ID: [name], female    DOB: July 24, 1931, 79 y.o.   MRN: [id number]  HPI Sick again Thinks she got exposed when she took her husband to doctor's office 2 days ago Got exposed to dust when they were blowing leaves, etc  Cough all night since Some SOB Hadn't been taking the singulair but started it now Has not been taking the flovent either Only a little whitish sputum  Current Outpatient Prescriptions on File Prior to Visit  Medication Sig Dispense Refill  . albuterol (PROVENTIL HFA;VENTOLIN HFA) 108 (90 BASE) MCG/ACT inhaler Inhale 2 puffs into the lungs every 6 (six) hours as needed.  18 g  1  . ALPRAZolam (XANAX) 0.25 MG tablet TAKE 1 TABLET BY MOUTH TWICE DAILY AS NEEDED FOR SLEEP  100 tablet  0  . aspirin 81 MG tablet Take 81 mg by mouth daily.        Marland Kitchen atorvastatin (LIPITOR) 20 MG tablet Take 1 tablet (20 mg total) by mouth daily.  30 tablet  5  . B-D UF III MINI PEN NEEDLES 31G X 5 MM MISC USE AS DIRECTED  100 each  0  . B-D ULTRA-FINE 33 LANCETS MISC USE TWICE DAILY OR AS DIRECTED  100 each  6  . calcitonin, salmon, (MIACALCIN/FORTICAL) 200 UNIT/ACT nasal spray INSTILL 1 SPRAY INTO THE NOSE EVERY DAY  3.7 mL  2  . CALCIUM-VITAMIN D PO Take 1 tablet by mouth daily.        . Chromium 100 MCG TABS Take by mouth daily.        . Coenzyme Q10 200 MG capsule Take 200 mg by mouth daily.        Marland Kitchen EPINEPHrine (EPI-PEN) 0.3 mg/0.3 mL DEVI Inject 0.3 mLs (0.3 mg total) into the muscle once.  2 Device  1  . fish oil-omega-3 fatty acids 1000 MG capsule Take 1 g by mouth daily.        . fluticasone (FLONASE) 50 MCG/ACT nasal spray Place 2 sprays into the nose daily.      . fluticasone (FLOVENT HFA) 110 MCG/ACT inhaler Inhale 2 puffs into the lungs daily.  1 Inhaler  1  . folic acid (FOLVITE) 1 MG tablet Take 1 mg by mouth daily.        Marland Kitchen LANOXIN 0.25 MG tablet Take 0.5 tablets (0.125 mg total) by mouth daily.  60 each  3  . LANTUS SOLOSTAR 100 UNIT/ML injection  INJECT 66 UNITS UNDER THE SKIN EVERY DAY  30 mL  0  . levothyroxine (SYNTHROID, LEVOTHROID) 88 MCG tablet TAKE 1 TABLET BY MOUTH EVERY DAY  90 tablet  1  . metFORMIN (GLUCOPHAGE) 500 MG tablet Take 1,000 mg by mouth 2 (two) times daily with a meal.       . metoprolol succinate (TOPROL-XL) 25 MG 24 hr tablet TAKE 1 TABLET BY MOUTH EVERY DAY  90 tablet  3  . Milk Thistle 500 MG CAPS Take by mouth daily.        . montelukast (SINGULAIR) 10 MG tablet Take 1 tablet (10 mg total) by mouth at bedtime.  30 tablet  11  . Multiple Vitamin (MULTIVITAMIN) capsule Take 1 capsule by mouth daily.        Marland Kitchen omeprazole (PRILOSEC) 20 MG capsule Take 1 capsule (20 mg total) by mouth daily.  90 capsule  1  . ONE TOUCH ULTRA TEST test strip TEST AS DIRECTED  100 each  11  .  TRAVEL SICKNESS 25 MG CHEW CHEW AND SWALLOW 1 TABLET BY MOUTH THREE TIMES DAILY AS NEEDED  90 tablet  0  . vitamin B-12 (CYANOCOBALAMIN) 100 MCG tablet Take 50 mcg by mouth daily.        . vitamin C (ASCORBIC ACID) 500 MG tablet Take 500 mg by mouth daily.        . Vitamins C E (CRANBERRY CONCENTRATE) 100-3 MG-UNIT CAPS Take 2 capsules by mouth daily.         No current facility-administered medications on file prior to visit.    Allergies  Allergen Reactions  . Cefdinir     REACTION: unspecified  . Clarithromycin     REACTION: weird dreams (on prednisone at the same time but has tolerated this in the past)  . Miglitol     REACTION: unspecified  . Niacin     REACTION: unspecified    Past Medical History  Diagnosis Date  . Allergy   . Asthma   . Diabetes mellitus   . GERD (gastroesophageal reflux disease)   . Hyperlipidemia   . Thyroid disease   . Atrial fibrillation   . Osteopenia   . Sleep disorder     Past Surgical History  Procedure Laterality Date  . Cholecystectomy    . Strabismus surgery  1946 / 1967  . Vaginal delivery      x2  . Cervical cone biopsy  1960's  . Nasal polyp surgery  01/01    Family History   Problem Relation Age of Onset  . Asthma Mother   . Coronary artery disease Father     History   Social History  . Marital Status: Married    Spouse Name: N/A    Number of Children: 2  . Years of Education: N/A   Occupational History  .     Social History Main Topics  . Smoking status: Never Smoker   . Smokeless tobacco: Never Used  . Alcohol Use: Yes  . Drug Use: Not on file  . Sexually Active: Not on file   Other Topics Concern  . Not on file   Social History Narrative  . No narrative on file   Review of Systems Feels warm in the morning but no apparent fever No vomiting Chronic loose stools from the metformin--uses imodium Weight is down another few pounds    Objective:   Physical Exam  Constitutional: She appears well-developed and well-nourished. No distress.  HENT:  Mouth/Throat: Oropharynx is clear and moist. No oropharyngeal exudate.  No sinus tenderness Moderate pale nasal congestion TMs normal  Neck: Normal range of motion. Neck supple.  Pulmonary/Chest: Effort normal. No respiratory distress. She has wheezes. She has no rales.  Fairly good air flow Mildly increased exp phase and mild exp wheezing  Lymphadenopathy:    She has no cervical adenopathy.          Assessment & Plan:

## 2013-04-12 NOTE — Assessment & Plan Note (Signed)
Seems to have been from allergic exposure Hasn't been taking any preventive medication Discussed using the singulair every day. If she still flares, we would stop this and restart the flovent every day Will give burst of prednisone

## 2013-04-18 ENCOUNTER — Telehealth: Payer: Self-pay | Admitting: Internal Medicine

## 2013-04-18 NOTE — Telephone Encounter (Signed)
Please add her on at noon tomorrow. I discussed this with her but don't know how to add the appt

## 2013-04-18 NOTE — Telephone Encounter (Signed)
Patient Information:  Caller Name: Lamisha  Phone: [phone]  Patient: [name], [name]  Gender: Female  DOB: 1931/04/13  Age: 79 Years  PCP: [facility]  Office Follow Up:  Does the office need to follow up with this patient?: Yes  Instructions For The Office: Please call regarding work in appointment.  RN Note:  Allergy-related cough began 04/10/13 when exposed to leaf blower. Saw MD 04/12/13; treated with Prrednisone for asthma excerbation.  Started old Rx of Augmentin 04/16/13 when saw small blood clot in sputum.  Now occasionally has blood-tinged sputum. FBS 69 at 1100. Wheeezing audible during conversation. Using Albuterol, Singulair and Flonase as directed. No appointments remain for 04/18/13.  Information noted and sent to office for work in appointment. Patient willing to wait until 04/19/13 to be seen.  Needs 1 hour to get to office.   Symptoms  Reason For Call & Symptoms: Diagnosed with bronchitis.  Started on Prednisone last week.  Called regarding episodes blood sputum; cough is improving. .  Reviewed Health History In EMR: Yes  Reviewed Medications In EMR: Yes  Reviewed Allergies In EMR: Yes  Reviewed Surgeries / Procedures: Yes  Date of Onset of Symptoms: 04/16/2013  Treatments Tried: Started using old Rx for Augmentin BID.  Treatments Tried Worked: Yes  Guideline(s) Used:  Cough  Disposition Per Guideline:   Go to Office Now  Reason For Disposition Reached:   Wheezing is present  Advice Given:  Reassurance  Coughing is the way that our lungs remove irritants and mucus. It helps protect our lungs from getting pneumonia.  Cough Medicines:  OTC Cough Drops: Cough drops can help a lot, especially for mild coughs. They reduce coughing by soothing your irritated throat and removing that tickle sensation in the back of the throat. Cough drops also have the advantage of portability - you can carry them with you.  Call Back If:  Difficulty breathing  Fever lasts > 3 days  You become worse.  Patient Will Follow Care Advice:  YES

## 2013-04-19 ENCOUNTER — Ambulatory Visit (INDEPENDENT_AMBULATORY_CARE_PROVIDER_SITE_OTHER): Payer: Medicare PPO | Admitting: Internal Medicine

## 2013-04-19 ENCOUNTER — Ambulatory Visit (INDEPENDENT_AMBULATORY_CARE_PROVIDER_SITE_OTHER)
Admission: RE | Admit: 2013-04-19 | Discharge: 2013-04-19 | Disposition: A | Discharge status: Home / Self Care | Payer: Medicare PPO | Source: Ambulatory Visit | Attending: Internal Medicine | Admitting: Internal Medicine

## 2013-04-19 ENCOUNTER — Encounter: Payer: Self-pay | Admitting: Internal Medicine

## 2013-04-19 VITALS — BP 150/70 | HR 91 | Temp 97.7°F | Wt 154.0 lb

## 2013-04-19 DIAGNOSIS — R042 Hemoptysis: Secondary | ICD-10-CM

## 2013-04-19 DIAGNOSIS — J209 Acute bronchitis, unspecified: Secondary | ICD-10-CM

## 2013-04-19 NOTE — Progress Notes (Signed)
Subjective:    Patient ID: [name], female    DOB: Nov 22, 1930, 79 y.o.   MRN: [id number]  HPI Here with husband Feels somewhat better but still coughing Noted a little blood in the sputum a few days ago--then cleared up  Did start augmentin that she had already--3 days ago Prednisone usually helps more than it has this time Not major wheezing--just "bubbling down there" Not really SOB  Current Outpatient Prescriptions on File Prior to Visit  Medication Sig Dispense Refill  . albuterol (PROVENTIL HFA;VENTOLIN HFA) 108 (90 BASE) MCG/ACT inhaler Inhale 2 puffs into the lungs every 6 (six) hours as needed.  18 g  1  . ALPRAZolam (XANAX) 0.25 MG tablet TAKE 1 TABLET BY MOUTH TWICE DAILY AS NEEDED FOR SLEEP  100 tablet  0  . aspirin 81 MG tablet Take 81 mg by mouth daily.        Marland Kitchen atorvastatin (LIPITOR) 20 MG tablet Take 1 tablet (20 mg total) by mouth daily.  30 tablet  5  . B-D UF III MINI PEN NEEDLES 31G X 5 MM MISC USE AS DIRECTED  100 each  0  . B-D ULTRA-FINE 33 LANCETS MISC USE TWICE DAILY OR AS DIRECTED  100 each  6  . calcitonin, salmon, (MIACALCIN/FORTICAL) 200 UNIT/ACT nasal spray INSTILL 1 SPRAY INTO THE NOSE EVERY DAY  3.7 mL  2  . CALCIUM-VITAMIN D PO Take 1 tablet by mouth daily.        . Chromium 100 MCG TABS Take by mouth daily.        . Coenzyme Q10 200 MG capsule Take 200 mg by mouth daily.        Marland Kitchen EPINEPHrine (EPI-PEN) 0.3 mg/0.3 mL DEVI Inject 0.3 mLs (0.3 mg total) into the muscle once.  2 Device  1  . fish oil-omega-3 fatty acids 1000 MG capsule Take 1 g by mouth daily.        . fluticasone (FLONASE) 50 MCG/ACT nasal spray Place 2 sprays into the nose daily.      . fluticasone (FLOVENT HFA) 110 MCG/ACT inhaler Inhale 2 puffs into the lungs daily.  1 Inhaler  1  . folic acid (FOLVITE) 1 MG tablet Take 1 mg by mouth daily.        Marland Kitchen LANOXIN 0.25 MG tablet Take 0.5 tablets (0.125 mg total) by mouth daily.  60 each  3  . LANTUS SOLOSTAR 100 UNIT/ML injection  INJECT 66 UNITS UNDER THE SKIN EVERY DAY  30 mL  0  . levothyroxine (SYNTHROID, LEVOTHROID) 88 MCG tablet TAKE 1 TABLET BY MOUTH EVERY DAY  90 tablet  1  . metFORMIN (GLUCOPHAGE) 500 MG tablet Take 1,000 mg by mouth 2 (two) times daily with a meal.       . metoprolol succinate (TOPROL-XL) 25 MG 24 hr tablet TAKE 1 TABLET BY MOUTH EVERY DAY  90 tablet  3  . Milk Thistle 500 MG CAPS Take by mouth daily.        . montelukast (SINGULAIR) 10 MG tablet Take 1 tablet (10 mg total) by mouth at bedtime.  30 tablet  11  . Multiple Vitamin (MULTIVITAMIN) capsule Take 1 capsule by mouth daily.        Marland Kitchen omeprazole (PRILOSEC) 20 MG capsule Take 1 capsule (20 mg total) by mouth daily.  90 capsule  1  . ONE TOUCH ULTRA TEST test strip TEST AS DIRECTED  100 each  11  . predniSONE (DELTASONE) 20 MG  tablet Take 2 tablets (40 mg total) by mouth daily.  15 tablet  0  . TRAVEL SICKNESS 25 MG CHEW CHEW AND SWALLOW 1 TABLET BY MOUTH THREE TIMES DAILY AS NEEDED  90 tablet  0  . vitamin B-12 (CYANOCOBALAMIN) 100 MCG tablet Take 50 mcg by mouth daily.        . vitamin C (ASCORBIC ACID) 500 MG tablet Take 500 mg by mouth daily.        . Vitamins C E (CRANBERRY CONCENTRATE) 100-3 MG-UNIT CAPS Take 2 capsules by mouth daily.         No current facility-administered medications on file prior to visit.    Allergies  Allergen Reactions  . Cefdinir     REACTION: unspecified  . Clarithromycin     REACTION: weird dreams (on prednisone at the same time but has tolerated this in the past)  . Miglitol     REACTION: unspecified  . Niacin     REACTION: unspecified    Past Medical History  Diagnosis Date  . Allergy   . Asthma   . Diabetes mellitus   . GERD (gastroesophageal reflux disease)   . Hyperlipidemia   . Thyroid disease   . Atrial fibrillation   . Osteopenia   . Sleep disorder     Past Surgical History  Procedure Laterality Date  . Cholecystectomy    . Strabismus surgery  1946 / 1967  . Vaginal delivery       x2  . Cervical cone biopsy  1960's  . Nasal polyp surgery  01/01    Family History  Problem Relation Age of Onset  . Asthma Mother   . Coronary artery disease Father     History   Social History  . Marital Status: Married    Spouse Name: N/A    Number of Children: 2  . Years of Education: N/A   Occupational History  .     Social History Main Topics  . Smoking status: Never Smoker   . Smokeless tobacco: Never Used  . Alcohol Use: Yes  . Drug Use: Not on file  . Sexually Active: Not on file   Other Topics Concern  . Not on file   Social History Narrative  . No narrative on file   Review of Systems Has been taking the meds regularly now    Objective:   Physical Exam  Constitutional: She appears well-developed and well-nourished. No distress.  HENT:  Mouth/Throat: Oropharynx is clear and moist. No oropharyngeal exudate.  Mild nasal congestion Opaque white mucus in left nare  Neck: Normal range of motion. Neck supple.  Pulmonary/Chest: Effort normal. No respiratory distress. She has no wheezes. She has no rales.  Not tight Normal exp phase Scattered loud rhonchi  Lymphadenopathy:    She has no cervical adenopathy.          Assessment & Plan:

## 2013-04-19 NOTE — Telephone Encounter (Signed)
appt made

## 2013-04-19 NOTE — Assessment & Plan Note (Signed)
Small amounts of blood--- very slight Probably from the infection CXR is normal Will finish out the augmentin and prednisone  If ongoing problems, will make referral to pulmonary (husband sees and likes Dr Craige Cotta)

## 2013-04-22 ENCOUNTER — Telehealth: Payer: Self-pay

## 2013-04-22 MED ORDER — PREDNISONE 20 MG PO TABS: 40.0000 mg | ORAL_TABLET | Freq: Every day | ORAL | Status: DC

## 2013-04-22 NOTE — Telephone Encounter (Signed)
Please let her know I sent the refill of the prednisone

## 2013-04-22 NOTE — Telephone Encounter (Signed)
Pt left v/m pt was seen on 04/19/13 and was to call back; pt said she has improved; no blood in sputum. Productive cough is a lot better. Has finished prednisone and has 2 more days of augmentin. Pt request refill of prednisone since pt is better but is not totally cleared. No fever. [facility] [address].

## 2013-04-23 NOTE — Telephone Encounter (Signed)
Left message on VM that rx was sent to pharmacy.

## 2013-04-25 ENCOUNTER — Other Ambulatory Visit: Payer: Self-pay

## 2013-04-25 MED ORDER — FIRST-DUKES MOUTHWASH MT SUSP: OROMUCOSAL | Status: DC

## 2013-04-25 NOTE — Telephone Encounter (Signed)
pts husband said pt has been taking prednisone and developed yeast infection in her mouth, tongue and inside of mouth is red and burns; request refill of [facility] Mouthwash to [facility] [address]Please advise.

## 2013-04-25 NOTE — Telephone Encounter (Signed)
rx sent to pharmacy by e-script .left message that rx has been called into pharmacy

## 2013-04-25 NOTE — Telephone Encounter (Signed)
Okay #8 ounces x 1

## 2013-04-30 ENCOUNTER — Telehealth: Payer: Self-pay

## 2013-04-30 NOTE — Telephone Encounter (Signed)
Pt taking Prednisone; Today at 12 noon pt just got up BS 43; pt called Dr Renae Fickle endocrinologist around  pt said Dr Ollen Gross office called her back and said since Dr Alphonsus Sias prescribed prednisone to call Dr Karle Starch office. Pt ate breakfast and took 4 sugar pills and BS went to 93 at 1 PM. Dr Philipp Ovens office called pt around 30 mins ago and pt just called Dr Alphonsus Sias. Last night BS was 308. Now BS is 195 and pt feels OK. Pt still has 2 days of Prednisone to take.Please advise. Pt has not taken Lantus or Metformin today.

## 2013-04-30 NOTE — Telephone Encounter (Signed)
Finish the prednisone - it is actually more likely to raise blood sugar than lower it  Continue to hold diabetes medicines today and update [place] with how sugar results are tomorrow and eat normally  If Dr Ollen Gross office instructs her differently (since they care for her DM) - follow their advice and let me know

## 2013-04-30 NOTE — Telephone Encounter (Signed)
Pt notified of Dr. Royden Purl recommendations and verbalized understanding, pt said Dr. Ollen Gross office just told her to check with our office, pt will call and update [place] tomorrow of BS results

## 2013-05-01 NOTE — Telephone Encounter (Signed)
Pt said today at 8 AM FBS 175; took Lantus and metformin today. At 1PM BS 79. Pt is just now eating lunch at 3:20 pm. Pt said she feels fine; just reporting her BS as requested.Please advise.

## 2013-05-01 NOTE — Telephone Encounter (Signed)
Keep a close eye on sugars - esp if symptoms of hypoglycemia - and for now cut lantus dose in half  Update tomorrow with how sugars are and if not low - go back to regular lantus dose after finishing prednisone

## 2013-05-01 NOTE — Telephone Encounter (Signed)
Pt notified of Dr. Tower's recommendations and verbalized understanding  

## 2013-05-03 ENCOUNTER — Other Ambulatory Visit: Payer: Self-pay | Admitting: Internal Medicine

## 2013-05-04 ENCOUNTER — Other Ambulatory Visit: Payer: Self-pay | Admitting: Internal Medicine

## 2013-05-10 ENCOUNTER — Encounter: Payer: Self-pay | Admitting: Radiology

## 2013-05-10 ENCOUNTER — Ambulatory Visit: Payer: Self-pay | Admitting: Internal Medicine

## 2013-05-13 ENCOUNTER — Encounter: Payer: Self-pay | Admitting: Internal Medicine

## 2013-05-13 ENCOUNTER — Ambulatory Visit (INDEPENDENT_AMBULATORY_CARE_PROVIDER_SITE_OTHER): Payer: Medicare PPO | Admitting: Internal Medicine

## 2013-05-13 VITALS — BP 130/60 | HR 65 | Temp 98.2°F | Wt 159.0 lb

## 2013-05-13 DIAGNOSIS — Z0000 Encounter for general adult medical examination without abnormal findings: Secondary | ICD-10-CM

## 2013-05-13 DIAGNOSIS — J45909 Unspecified asthma, uncomplicated: Secondary | ICD-10-CM

## 2013-05-13 DIAGNOSIS — I4891 Unspecified atrial fibrillation: Secondary | ICD-10-CM

## 2013-05-13 DIAGNOSIS — E119 Type 2 diabetes mellitus without complications: Secondary | ICD-10-CM

## 2013-05-13 DIAGNOSIS — I10 Essential (primary) hypertension: Secondary | ICD-10-CM

## 2013-05-13 DIAGNOSIS — J4530 Mild persistent asthma, uncomplicated: Secondary | ICD-10-CM

## 2013-05-13 LAB — HM DIABETES FOOT EXAM

## 2013-05-13 NOTE — Progress Notes (Signed)
Subjective:    Patient ID: [name], female    DOB: 1931-09-11, 79 y.o.   MRN: [id number]  HPI Here for physical Reviewed advanced directives  Breathing is back to normal----no SOB or wheezing Does note some sighing breaths at times ("or like my breath is skipping")  Still frustrated about husband's dependency  Still sees Dr Renae Fickle for her diabetes Last A1c 7.4%  Current Outpatient Prescriptions on File Prior to Visit  Medication Sig Dispense Refill  . albuterol (PROVENTIL HFA;VENTOLIN HFA) 108 (90 BASE) MCG/ACT inhaler Inhale 2 puffs into the lungs every 6 (six) hours as needed.  18 g  1  . ALPRAZolam (XANAX) 0.25 MG tablet TAKE 1 TABLET BY MOUTH TWICE DAILY AS NEEDED FOR SLEEP  100 tablet  0  . aspirin 81 MG tablet Take 81 mg by mouth daily.        Marland Kitchen atorvastatin (LIPITOR) 20 MG tablet Take 1 tablet (20 mg total) by mouth daily.  30 tablet  5  . B-D UF III MINI PEN NEEDLES 31G X 5 MM MISC USE AS DIRECTED  100 each  0  . B-D ULTRA-FINE 33 LANCETS MISC USE TWICE DAILY OR AS DIRECTED  100 each  6  . calcitonin, salmon, (MIACALCIN/FORTICAL) 200 UNIT/ACT nasal spray INSTILL 1 SPRAY INTO THE NOSE EVERY DAY  3.7 mL  2  . CALCIUM-VITAMIN D PO Take 1 tablet by mouth daily.        . Chromium 100 MCG TABS Take by mouth daily.        . Coenzyme Q10 200 MG capsule Take 200 mg by mouth daily.        . Diphenhyd-Hydrocort-Nystatin (FIRST-DUKES MOUTHWASH) SUSP Swish and spit 5 ml by mouth 4 times daily as needed  237 mL  1  . EPINEPHrine (EPI-PEN) 0.3 mg/0.3 mL DEVI Inject 0.3 mLs (0.3 mg total) into the muscle once.  2 Device  1  . fish oil-omega-3 fatty acids 1000 MG capsule Take 1 g by mouth daily.        . fluticasone (FLONASE) 50 MCG/ACT nasal spray Place 2 sprays into the nose daily.      . fluticasone (FLOVENT HFA) 110 MCG/ACT inhaler Inhale 2 puffs into the lungs daily.  1 Inhaler  1  . folic acid (FOLVITE) 1 MG tablet Take 1 mg by mouth daily.        Marland Kitchen LANOXIN 0.25 MG tablet Take  0.5 tablets (0.125 mg total) by mouth daily.  60 each  3  . LANTUS SOLOSTAR 100 UNIT/ML SOPN INJECT 66 UNITS UNDER THE SKIN EVERY DAY  30 mL  0  . levothyroxine (SYNTHROID, LEVOTHROID) 88 MCG tablet TAKE 1 TABLET BY MOUTH EVERY DAY  90 tablet  1  . metFORMIN (GLUCOPHAGE) 500 MG tablet Take 1,000 mg by mouth 2 (two) times daily with a meal.       . metoprolol succinate (TOPROL-XL) 25 MG 24 hr tablet TAKE 1 TABLET BY MOUTH EVERY DAY  90 tablet  3  . Milk Thistle 500 MG CAPS Take by mouth daily.        . montelukast (SINGULAIR) 10 MG tablet Take 1 tablet (10 mg total) by mouth at bedtime.  30 tablet  11  . Multiple Vitamin (MULTIVITAMIN) capsule Take 1 capsule by mouth daily.        Marland Kitchen omeprazole (PRILOSEC) 20 MG capsule Take 1 capsule (20 mg total) by mouth daily.  90 capsule  1  . ONE TOUCH  ULTRA TEST test strip TEST AS DIRECTED  100 each  11  . TRAVEL SICKNESS 25 MG CHEW CHEW AND SWALLOW 1 TABLET BY MOUTH THREE TIMES DAILY AS NEEDED  90 tablet  0  . vitamin B-12 (CYANOCOBALAMIN) 100 MCG tablet Take 50 mcg by mouth daily.        . vitamin C (ASCORBIC ACID) 500 MG tablet Take 500 mg by mouth daily.        . Vitamins C E (CRANBERRY CONCENTRATE) 100-3 MG-UNIT CAPS Take 2 capsules by mouth daily.         No current facility-administered medications on file prior to visit.    Allergies  Allergen Reactions  . Cefdinir     REACTION: unspecified  . Clarithromycin     REACTION: weird dreams (on prednisone at the same time but has tolerated this in the past)  . Miglitol     REACTION: unspecified  . Niacin     REACTION: unspecified    Past Medical History  Diagnosis Date  . Allergy   . Asthma   . Diabetes mellitus   . GERD (gastroesophageal reflux disease)   . Hyperlipidemia   . Thyroid disease   . Atrial fibrillation   . Osteopenia   . Sleep disorder     Past Surgical History  Procedure Laterality Date  . Cholecystectomy    . Strabismus surgery  1946 / 1967  . Vaginal delivery       x2  . Cervical cone biopsy  1960's  . Nasal polyp surgery  01/01    Family History  Problem Relation Age of Onset  . Asthma Mother   . Coronary artery disease Father     History   Social History  . Marital Status: Married    Spouse Name: N/A    Number of Children: 2  . Years of Education: N/A   Occupational History  .     Social History Main Topics  . Smoking status: Never Smoker   . Smokeless tobacco: Never Used  . Alcohol Use: Yes  . Drug Use: Not on file  . Sexually Active: Not on file   Other Topics Concern  . Not on file   Social History Narrative   Has living will   Not sure about formal POA --but requests husband, then daughter [name] (and son [name]) to be health care POAs   Would accept resuscitation attempts   Not sure about feeding tubes   Review of Systems  Constitutional: Negative for fatigue and unexpected weight change.       Has lost weight with improved lifestyle Wears seat belt  HENT: Positive for hearing loss, congestion and rhinorrhea. Negative for dental problem and tinnitus.        Mild hearing problems Regular with dentist  Eyes: Positive for visual disturbance.       Chronic diplopia due to left eye problems (?strabismus)  Respiratory: Positive for cough. Negative for chest tightness and shortness of breath.   Cardiovascular: Positive for palpitations. Negative for chest pain and leg swelling.       Feels irregular heart beat at times   Gastrointestinal: Positive for diarrhea. Negative for nausea, vomiting and blood in stool.       Heartburn is controlled Loose stools with metformin-- uses 1/2 imodium every few days to control it  Endocrine: Negative for cold intolerance and heat intolerance.  Genitourinary: Negative for dysuria and urgency.       Nocturia x 2 No incontinence  Musculoskeletal: Positive for back pain. Negative for joint swelling and arthralgias.  Skin: Negative for rash.       No suspicious lesions Sees Dr Roseanne Kaufman   Allergic/Immunologic: Positive for environmental allergies. Negative for immunocompromised state.  Neurological: Positive for dizziness, light-headedness and numbness. Negative for syncope and weakness.       Some tingling in arms if lies on them wrong---positional and temporary  Hematological: Negative for adenopathy. Does not bruise/bleed easily.  Psychiatric/Behavioral: Negative for sleep disturbance and dysphoric mood. The patient is nervous/anxious.        Objective:   Physical Exam  Constitutional: She is oriented to person, place, and time. She appears well-developed and well-nourished. No distress.  HENT:  Head: Normocephalic and atraumatic.  Right Ear: External ear normal.  Left Ear: External ear normal.  Mouth/Throat: Oropharynx is clear and moist. No oropharyngeal exudate.  Eyes: Conjunctivae and EOM are normal. Pupils are equal, round, and reactive to light.  Neck: Normal range of motion. Neck supple. No thyromegaly present.  Cardiovascular: Normal rate, regular rhythm, normal heart sounds and intact distal pulses.  Exam reveals no gallop.   No murmur heard. Pulmonary/Chest: Effort normal and breath sounds normal. No respiratory distress. She has no wheezes. She has no rales.  Abdominal: Soft. There is no tenderness.  Musculoskeletal: She exhibits no edema and no tenderness.  Lymphadenopathy:    She has no cervical adenopathy.  Neurological: She is alert and oriented to person, place, and time.  Normal sensation in feet  Skin: No rash noted. No erythema.  Psychiatric: She has a normal mood and affect. Her behavior is normal.          Assessment & Plan:

## 2013-05-13 NOTE — Assessment & Plan Note (Signed)
Quiet now Will take the flovent daily now

## 2013-05-13 NOTE — Assessment & Plan Note (Signed)
BP Readings from Last 3 Encounters:  05/13/13 130/60  04/19/13 150/70  04/12/13 160/80   Better now---probably due to lifestyle changes and weight loss No changes  Will let Dr Renae Fickle do all blood work (needs met C and lipid once a year)

## 2013-05-13 NOTE — Assessment & Plan Note (Signed)
Just had mammogram--recommended no more due to age No further colon cancer screening

## 2013-05-13 NOTE — Assessment & Plan Note (Signed)
Dr Renae Fickle follows Now under good control

## 2013-05-13 NOTE — Assessment & Plan Note (Signed)
She feels palps at times but no documented atrial fib in many years Continue just the aspirin

## 2013-05-16 ENCOUNTER — Other Ambulatory Visit: Payer: Self-pay | Admitting: Internal Medicine

## 2013-05-29 ENCOUNTER — Other Ambulatory Visit: Payer: Self-pay | Admitting: Internal Medicine

## 2013-06-20 ENCOUNTER — Other Ambulatory Visit: Payer: Self-pay | Admitting: Family Medicine

## 2013-06-20 ENCOUNTER — Other Ambulatory Visit: Payer: Self-pay | Admitting: Internal Medicine

## 2013-06-21 NOTE — Telephone Encounter (Signed)
Alprazolam refill called to [facility].

## 2013-06-28 ENCOUNTER — Other Ambulatory Visit: Payer: Self-pay | Admitting: *Deleted

## 2013-06-28 MED ORDER — GLUCOSE BLOOD VI STRP: ORAL_STRIP | Status: DC

## 2013-06-30 ENCOUNTER — Other Ambulatory Visit: Payer: Self-pay | Admitting: Internal Medicine

## 2013-07-01 ENCOUNTER — Telehealth: Payer: Self-pay | Admitting: Internal Medicine

## 2013-07-01 NOTE — Telephone Encounter (Signed)
[name] called for wife, [name].  States that wife needs prior [place] for [employer].  [employer] needs due to non-generic.  Best number for pt is [phone] / [phone]

## 2013-07-05 ENCOUNTER — Ambulatory Visit: Payer: Medicare PPO | Admitting: Internal Medicine

## 2013-07-05 MED ORDER — PREDNISONE 20 MG PO TABS: 20.0000 mg | ORAL_TABLET | Freq: Two times a day (BID) | ORAL | Status: DC

## 2013-07-05 NOTE — Telephone Encounter (Signed)
Okay to send Rx for prednisone 20mg   #6 x 0 2 tabs daily

## 2013-07-05 NOTE — Telephone Encounter (Signed)
rx sent to pharmacy by e-script Lyla Son spoke with patient and advised rx sent to pharmacy

## 2013-07-05 NOTE — Telephone Encounter (Signed)
Pharmacist at [facility] called to verify our office has received PA for Flonase; advised we have received the PA.

## 2013-07-05 NOTE — Telephone Encounter (Signed)
Mr [name] called for status of PA for Flonase; pt has been coughing a lot since pt is out of Flonase. Productive cough with light color phlegm; no SOB or difficulty breathing. Mr [name] request Prednisone called in [facility]. While waiting on Flonase PA without pt having to come for appt.Please advise.

## 2013-07-05 NOTE — Telephone Encounter (Signed)
[facility] left v/m status of prednisone rx. Spoke with [facility] and they did receive prednisone rx electronically.

## 2013-07-08 ENCOUNTER — Encounter: Payer: Self-pay | Admitting: Radiology

## 2013-07-08 ENCOUNTER — Telehealth: Payer: Self-pay | Admitting: *Deleted

## 2013-07-08 MED ORDER — PREDNISONE 20 MG PO TABS: 20.0000 mg | ORAL_TABLET | Freq: Two times a day (BID) | ORAL | Status: DC

## 2013-07-08 NOTE — Telephone Encounter (Signed)
Prior auth filled out faxed and scanned

## 2013-07-08 NOTE — Telephone Encounter (Signed)
Okay to send prednisone 20mg   1 daily  (as a wean down) #5 x 0

## 2013-07-08 NOTE — Addendum Note (Signed)
Addended by: Sueanne Margarita on: 07/08/2013 05:32 PM   Modules accepted: Orders

## 2013-07-08 NOTE — Telephone Encounter (Signed)
Please see message Carlena Sax put under my message, husband calling asking for more prednisone until the prior auth comes thru, i advised she may need to be seen and he scheduled appt tomorrow at 11:15 but still would like a answer today.

## 2013-07-08 NOTE — Telephone Encounter (Signed)
[name] called stating that pt was given 6 tabs prednisone over the weekend that helped a little but she still has a bad cough.  Please advise.

## 2013-07-08 NOTE — Telephone Encounter (Signed)
rx sent to pharmacy by e-script Spoke with patient and advised results   

## 2013-07-09 ENCOUNTER — Ambulatory Visit (INDEPENDENT_AMBULATORY_CARE_PROVIDER_SITE_OTHER): Payer: Medicare PPO | Admitting: Internal Medicine

## 2013-07-09 ENCOUNTER — Ambulatory Visit (INDEPENDENT_AMBULATORY_CARE_PROVIDER_SITE_OTHER)
Admission: RE | Admit: 2013-07-09 | Discharge: 2013-07-09 | Disposition: A | Discharge status: Home / Self Care | Payer: Medicare PPO | Source: Ambulatory Visit | Attending: Internal Medicine | Admitting: Internal Medicine

## 2013-07-09 ENCOUNTER — Encounter: Payer: Self-pay | Admitting: Internal Medicine

## 2013-07-09 VITALS — BP 150/70 | HR 95 | Temp 97.6°F | Resp 14 | Wt 156.0 lb

## 2013-07-09 DIAGNOSIS — J45901 Unspecified asthma with (acute) exacerbation: Secondary | ICD-10-CM

## 2013-07-09 NOTE — Assessment & Plan Note (Signed)
Very mild Not clearly having infection but CXR 2 months ago showed atelectasis---will recheck Weaning prednisone now Hopefully will get approval for her flonase in next 1-2 days Consider antibiotic depending on CXR and if sputum worsens May need to extend prednisone

## 2013-07-09 NOTE — Progress Notes (Signed)
Subjective:    Patient ID: [name], female    DOB: 23-Sep-1931, 79 y.o.   MRN: [id number]  HPI Has cough again "I know it is my allergies" Ran out of flonase--- working on approval now since she gets brand only  Now got into her chest Seems to be productive but can't expectorate Only white sputum No fever  Intermittent wheezing Seems to be better when coughs up stuff Prednisone has helped her  Current Outpatient Prescriptions on File Prior to Visit  Medication Sig Dispense Refill  . albuterol (PROVENTIL HFA;VENTOLIN HFA) 108 (90 BASE) MCG/ACT inhaler Inhale 2 puffs into the lungs every 6 (six) hours as needed.  18 g  1  . ALPRAZolam (XANAX) 0.25 MG tablet TAKE 1 TABLET BY MOUTH TWICE DAILY AS NEEDED FOR SLEEP  100 tablet  0  . aspirin 81 MG tablet Take 81 mg by mouth daily.        Marland Kitchen atorvastatin (LIPITOR) 20 MG tablet Take 1 tablet (20 mg total) by mouth daily.  30 tablet  5  . B-D UF III MINI PEN NEEDLES 31G X 5 MM MISC USE AS DIRECTED  100 each  0  . B-D ULTRA-FINE 33 LANCETS MISC USE TWICE DAILY OR AS DIRECTED  100 each  6  . calcitonin, salmon, (MIACALCIN/FORTICAL) 200 UNIT/ACT nasal spray INSTILL 1 SPRAY INTO THE NOSE EVERY DAY  3.7 mL  2  . CALCIUM-VITAMIN D PO Take 1 tablet by mouth daily.        . Chromium 100 MCG TABS Take by mouth daily.        . Coenzyme Q10 200 MG capsule Take 200 mg by mouth daily.        . Diphenhyd-Hydrocort-Nystatin (FIRST-DUKES MOUTHWASH) SUSP Swish and spit 5 ml by mouth 4 times daily as needed  237 mL  1  . EPINEPHrine (EPI-PEN) 0.3 mg/0.3 mL DEVI Inject 0.3 mLs (0.3 mg total) into the muscle once.  2 Device  1  . fish oil-omega-3 fatty acids 1000 MG capsule Take 1 g by mouth daily.        . fluticasone (FLONASE) 50 MCG/ACT nasal spray INSTILL 2 SPRAYS INTO EACH NOSTRIL DAILY  16 g  11  . fluticasone (FLOVENT HFA) 110 MCG/ACT inhaler Inhale 2 puffs into the lungs daily.  1 Inhaler  1  . folic acid (FOLVITE) 1 MG tablet Take 1 mg by mouth  daily.        Marland Kitchen glucose blood (ONE TOUCH ULTRA TEST) test strip Use to test blood sugar 1-2 times daily or as directed. Dx 250.00  200 each  3  . LANOXIN 250 MCG tablet TAKE 1/2 TABLET BY MOUTH EVERY DAY  60 tablet  0  . LANTUS SOLOSTAR 100 UNIT/ML SOPN INJECT 66 UNITS UNDER THE SKIN EVERY DAY  30 mL  0  . levothyroxine (SYNTHROID, LEVOTHROID) 88 MCG tablet TAKE 1 TABLET BY MOUTH EVERY DAY  90 tablet  0  . metFORMIN (GLUCOPHAGE) 500 MG tablet Take 1,000 mg by mouth 2 (two) times daily with a meal.       . metoprolol succinate (TOPROL-XL) 25 MG 24 hr tablet TAKE 1 TABLET BY MOUTH EVERY DAY  90 tablet  3  . Milk Thistle 500 MG CAPS Take by mouth daily.        . montelukast (SINGULAIR) 10 MG tablet Take 1 tablet (10 mg total) by mouth at bedtime.  30 tablet  11  . Multiple Vitamin (MULTIVITAMIN) capsule  Take 1 capsule by mouth daily.        Marland Kitchen omeprazole (PRILOSEC) 20 MG capsule TAKE 1 CAPSULE BY MOUTH EVERY DAY  90 capsule  0  . TRAVEL SICKNESS 25 MG CHEW CHEW AND SWALLOW 1 TABLET BY MOUTH THREE TIMES DAILY AS NEEDED  90 tablet  0  . vitamin B-12 (CYANOCOBALAMIN) 100 MCG tablet Take 50 mcg by mouth daily.        . vitamin C (ASCORBIC ACID) 500 MG tablet Take 500 mg by mouth daily.        . Vitamins C E (CRANBERRY CONCENTRATE) 100-3 MG-UNIT CAPS Take 2 capsules by mouth daily.         No current facility-administered medications on file prior to visit.    Allergies  Allergen Reactions  . Cefdinir     REACTION: unspecified  . Clarithromycin     REACTION: weird dreams (on prednisone at the same time but has tolerated this in the past)  . Miglitol     REACTION: unspecified  . Niacin     REACTION: unspecified    Past Medical History  Diagnosis Date  . Allergy   . Asthma   . Diabetes mellitus   . GERD (gastroesophageal reflux disease)   . Hyperlipidemia   . Thyroid disease   . Atrial fibrillation   . Osteopenia   . Sleep disorder     Past Surgical History  Procedure Laterality  Date  . Cholecystectomy    . Strabismus surgery  1946 / 1967  . Vaginal delivery      x2  . Cervical cone biopsy  1960's  . Nasal polyp surgery  01/01    Family History  Problem Relation Age of Onset  . Asthma Mother   . Coronary artery disease Father     History   Social History  . Marital Status: Married    Spouse Name: N/A    Number of Children: 2  . Years of Education: N/A   Occupational History  .     Social History Main Topics  . Smoking status: Never Smoker   . Smokeless tobacco: Never Used  . Alcohol Use: Yes  . Drug Use: Not on file  . Sexual Activity: Not on file   Other Topics Concern  . Not on file   Social History Narrative   Has living will   Not sure about formal POA --but requests husband, then daughter [name] (and son [name]) to be health care POAs   Would accept resuscitation attempts   Not sure about feeding tubes   Review of Systems Sugars up some---after eating a donut! Weight is stable-- down 3# since June    Objective:   Physical Exam  Constitutional: She appears well-developed and well-nourished. No distress.  HENT:  Mouth/Throat: Oropharynx is clear and moist. No oropharyngeal exudate.  Moderate pale nasal congestion  Neck: Normal range of motion. Neck supple. No thyromegaly present.  Pulmonary/Chest: Effort normal. No respiratory distress. She has wheezes.  Slight end exp wheezes and rhonchi---clear with cough  Lymphadenopathy:    She has no cervical adenopathy.          Assessment & Plan:

## 2013-07-11 ENCOUNTER — Other Ambulatory Visit: Payer: Self-pay | Admitting: Internal Medicine

## 2013-07-12 ENCOUNTER — Other Ambulatory Visit: Payer: Self-pay | Admitting: Internal Medicine

## 2013-07-12 ENCOUNTER — Telehealth: Payer: Self-pay | Admitting: Internal Medicine

## 2013-07-12 MED ORDER — PREDNISONE 20 MG PO TABS: 20.0000 mg | ORAL_TABLET | Freq: Every day | ORAL | Status: DC

## 2013-07-12 NOTE — Telephone Encounter (Signed)
Probably best to just extend the prednisone a little  Send Rx for prednisone 20mg  #7 x 0 1 daily  Have them call next week if the cough isn't fading She should have already gotten her flonase since it was approved

## 2013-07-12 NOTE — Telephone Encounter (Signed)
Pt's husband called and says pt's cough is a little better but she still persistent. He says she's also having night sweats. She has taken all the prednisone.  Pt wants you to call to advise further instructions.  If you don't reach them on the cell phone # of [phone], then please try the home # [phone]. Thank you.

## 2013-07-12 NOTE — Telephone Encounter (Signed)
Pt informed and RX sent 

## 2013-07-26 ENCOUNTER — Encounter: Payer: Self-pay | Admitting: Internal Medicine

## 2013-08-15 ENCOUNTER — Other Ambulatory Visit: Payer: Self-pay | Admitting: Internal Medicine

## 2013-08-21 ENCOUNTER — Ambulatory Visit (INDEPENDENT_AMBULATORY_CARE_PROVIDER_SITE_OTHER): Payer: Medicare PPO

## 2013-08-21 ENCOUNTER — Encounter: Payer: Self-pay | Admitting: Internal Medicine

## 2013-08-21 DIAGNOSIS — Z23 Encounter for immunization: Secondary | ICD-10-CM

## 2013-09-05 ENCOUNTER — Ambulatory Visit (INDEPENDENT_AMBULATORY_CARE_PROVIDER_SITE_OTHER): Payer: Medicare PPO | Admitting: Internal Medicine

## 2013-09-05 ENCOUNTER — Encounter: Payer: Self-pay | Admitting: Internal Medicine

## 2013-09-05 VITALS — BP 128/70 | HR 75 | Temp 97.8°F | Wt 158.0 lb

## 2013-09-05 DIAGNOSIS — R1013 Epigastric pain: Secondary | ICD-10-CM

## 2013-09-05 NOTE — Patient Instructions (Signed)
Please increase the omeprazole to twice a day. Make sure you take it on a mostly empty stomach. Let me know if you are not feeling better within 2-3 weeks.

## 2013-09-05 NOTE — Progress Notes (Signed)
Subjective:    Patient ID: [name], female    DOB: 02/03/1931, 79 y.o.   MRN: [id number]  HPI Here with husband  Having some abdominal pain Variable Appetite is off now also Pain will go into her back and up into neck On omeprazole but may forget it at times---not sure if she is taking on empty stomach No nausea or vomiting Pain can come anytime-- often at night when lying in bed Not clearly related to meals  Current Outpatient Prescriptions on File Prior to Visit  Medication Sig Dispense Refill  . albuterol (PROVENTIL HFA;VENTOLIN HFA) 108 (90 BASE) MCG/ACT inhaler Inhale 2 puffs into the lungs every 6 (six) hours as needed.  18 g  1  . ALPRAZolam (XANAX) 0.25 MG tablet TAKE 1 TABLET BY MOUTH TWICE DAILY AS NEEDED FOR SLEEP  100 tablet  0  . aspirin 81 MG tablet Take 81 mg by mouth daily.        Marland Kitchen atorvastatin (LIPITOR) 20 MG tablet Take 1 tablet (20 mg total) by mouth daily.  30 tablet  5  . B-D ULTRA-FINE 33 LANCETS MISC USE TWICE DAILY OR AS DIRECTED  100 each  6  . calcitonin, salmon, (MIACALCIN/FORTICAL) 200 UNIT/ACT nasal spray INSTILL 1 SPRAY INTO THE NOSE EVERY DAY  3.7 mL  2  . CALCIUM-VITAMIN D PO Take 1 tablet by mouth daily.        . Chromium 100 MCG TABS Take by mouth daily.        . Coenzyme Q10 200 MG capsule Take 200 mg by mouth daily.        Marland Kitchen EPINEPHrine (EPI-PEN) 0.3 mg/0.3 mL DEVI Inject 0.3 mLs (0.3 mg total) into the muscle once.  2 Device  1  . fish oil-omega-3 fatty acids 1000 MG capsule Take 1 g by mouth daily.        . fluticasone (FLONASE) 50 MCG/ACT nasal spray INSTILL 2 SPRAYS INTO EACH NOSTRIL DAILY  16 g  11  . fluticasone (FLOVENT HFA) 110 MCG/ACT inhaler Inhale 2 puffs into the lungs daily.  1 Inhaler  1  . folic acid (FOLVITE) 1 MG tablet Take 1 mg by mouth daily.        Marland Kitchen glucose blood (ONE TOUCH ULTRA TEST) test strip Use to test blood sugar 1-2 times daily or as directed. Dx 250.00  200 each  3  . Insulin Pen Needle (B-D UF III MINI PEN  NEEDLES) 31G X 5 MM MISC Use as directed to inject insulin once daily as needed dx: 250.00  100 each  3  . LANOXIN 250 MCG tablet TAKE 1/2 TABLET BY MOUTH EVERY DAY  60 tablet  0  . LANTUS SOLOSTAR 100 UNIT/ML SOPN INJECT 66 UNITS UNDER THE SKIN EVERY DAY  30 mL  11  . levothyroxine (SYNTHROID, LEVOTHROID) 88 MCG tablet TAKE 1 TABLET BY MOUTH EVERY DAY  90 tablet  0  . meclizine (ANTIVERT) 25 MG tablet TAKE 1 TABLET BY MOUTH THREE TIMES DAILY AS NEEDED  90 tablet  0  . metFORMIN (GLUCOPHAGE) 500 MG tablet Take 1,000 mg by mouth 2 (two) times daily with a meal.       . metoprolol succinate (TOPROL-XL) 25 MG 24 hr tablet TAKE 1 TABLET BY MOUTH EVERY DAY  90 tablet  3  . Milk Thistle 500 MG CAPS Take by mouth daily.        . montelukast (SINGULAIR) 10 MG tablet Take 1 tablet (10 mg  total) by mouth at bedtime.  30 tablet  11  . Multiple Vitamin (MULTIVITAMIN) capsule Take 1 capsule by mouth daily.        Marland Kitchen omeprazole (PRILOSEC) 20 MG capsule TAKE 1 CAPSULE BY MOUTH EVERY DAY  90 capsule  0  . vitamin B-12 (CYANOCOBALAMIN) 100 MCG tablet Take 50 mcg by mouth daily.        . vitamin C (ASCORBIC ACID) 500 MG tablet Take 500 mg by mouth daily.        . Vitamins C E (CRANBERRY CONCENTRATE) 100-3 MG-UNIT CAPS Take 2 capsules by mouth daily.         No current facility-administered medications on file prior to visit.    Allergies  Allergen Reactions  . Cefdinir     REACTION: unspecified  . Clarithromycin     REACTION: weird dreams (on prednisone at the same time but has tolerated this in the past)  . Miglitol     REACTION: unspecified  . Niacin     REACTION: unspecified    Past Medical History  Diagnosis Date  . Allergy   . Asthma   . Diabetes mellitus   . GERD (gastroesophageal reflux disease)   . Hyperlipidemia   . Thyroid disease   . Atrial fibrillation   . Osteopenia   . Sleep disorder     Past Surgical History  Procedure Laterality Date  . Cholecystectomy    . Strabismus  surgery  1946 / 1967  . Vaginal delivery      x2  . Cervical cone biopsy  1960's  . Nasal polyp surgery  01/01    Family History  Problem Relation Age of Onset  . Asthma Mother   . Coronary artery disease Father     History   Social History  . Marital Status: Married    Spouse Name: N/A    Number of Children: 2  . Years of Education: N/A   Occupational History  .     Social History Main Topics  . Smoking status: Never Smoker   . Smokeless tobacco: Never Used  . Alcohol Use: Yes  . Drug Use: Not on file  . Sexual Activity: Not on file   Other Topics Concern  . Not on file   Social History Narrative   Has living will   Not sure about formal POA --but requests husband, then daughter [name] (and son [name]) to be health care POAs   Would accept resuscitation attempts   Not sure about feeding tubes   Review of Systems Has not lost weight    Objective:   Physical Exam  Constitutional: She appears well-developed and well-nourished. No distress.  Neck: Normal range of motion. Neck supple. No thyromegaly present.  Pulmonary/Chest: Effort normal and breath sounds normal. No respiratory distress. She has no wheezes. She has no rales.  Abdominal: Soft. Bowel sounds are normal. She exhibits no distension and no mass. There is tenderness. There is no rebound and no guarding.  Mild epigastric tenderness  Lymphadenopathy:    She has no cervical adenopathy.  Psychiatric: She has a normal mood and affect. Her behavior is normal.          Assessment & Plan:

## 2013-09-05 NOTE — Assessment & Plan Note (Signed)
Radiates to back and neck Mostly at night Fairly classic for GERD Husband worried about pancreatic cancer---history doesn't suggest this  Will increase omeprazole to bid If persistent, will refer to GI or start with CT scan

## 2013-09-19 ENCOUNTER — Other Ambulatory Visit: Payer: Self-pay | Admitting: Internal Medicine

## 2013-09-20 ENCOUNTER — Ambulatory Visit: Payer: Medicare PPO | Admitting: Internal Medicine

## 2013-09-23 ENCOUNTER — Ambulatory Visit (INDEPENDENT_AMBULATORY_CARE_PROVIDER_SITE_OTHER): Payer: Medicare PPO | Admitting: Internal Medicine

## 2013-09-23 ENCOUNTER — Encounter: Payer: Self-pay | Admitting: Internal Medicine

## 2013-09-23 VITALS — BP 140/80 | HR 88 | Temp 97.5°F | Wt 158.0 lb

## 2013-09-23 DIAGNOSIS — R05 Cough: Secondary | ICD-10-CM

## 2013-09-23 NOTE — Patient Instructions (Signed)
Please try cetirizine at bedtime to see if that helps your cough.

## 2013-09-23 NOTE — Progress Notes (Signed)
Pre-visit discussion using our clinic review tool. No additional management support is needed unless otherwise documented below in the visit note.  

## 2013-09-23 NOTE — Assessment & Plan Note (Signed)
Sounds like it is from drainage and allergies Will have her try an antihistamine The blood (if it really was blood) is probably from the nose  Will proceed with pulmonary evaluation since husband is very worried about cancer or more serious process

## 2013-09-23 NOTE — Progress Notes (Signed)
Subjective:    Patient ID: [name], female    DOB: 07-19-1931, 79 y.o.   MRN: [id number]  HPI Has had a cough for all summer and fall --has related this to allergies Ragweed? Mold?  Actually was coughing up some blood--small blood tinged spots Shows small brownish spot on tissue from this morning (not clearly like blood) Does use her inhalers-- this does help (loosens her up) Not SOB No fevers Does seem to have post nasal drip--does use nasal sprays  Current Outpatient Prescriptions on File Prior to Visit  Medication Sig Dispense Refill  . albuterol (PROVENTIL HFA;VENTOLIN HFA) 108 (90 BASE) MCG/ACT inhaler Inhale 2 puffs into the lungs every 6 (six) hours as needed.  18 g  1  . ALPRAZolam (XANAX) 0.25 MG tablet TAKE 1 TABLET BY MOUTH TWICE DAILY AS NEEDED FOR SLEEP  100 tablet  0  . aspirin 81 MG tablet Take 81 mg by mouth daily.        Marland Kitchen atorvastatin (LIPITOR) 20 MG tablet TAKE 1 TABLET BY MOUTH EVERY DAY  90 tablet  3  . B-D ULTRA-FINE 33 LANCETS MISC USE TWICE DAILY OR AS DIRECTED  100 each  6  . calcitonin, salmon, (MIACALCIN/FORTICAL) 200 UNIT/ACT nasal spray INSTILL 1 SPRAY INTO THE NOSE EVERY DAY  3.7 mL  2  . CALCIUM-VITAMIN D PO Take 1 tablet by mouth daily.        . Chromium 100 MCG TABS Take by mouth daily.        . Coenzyme Q10 200 MG capsule Take 200 mg by mouth daily.        Marland Kitchen EPINEPHrine (EPI-PEN) 0.3 mg/0.3 mL DEVI Inject 0.3 mLs (0.3 mg total) into the muscle once.  2 Device  1  . fish oil-omega-3 fatty acids 1000 MG capsule Take 1 g by mouth daily.        . fluticasone (FLONASE) 50 MCG/ACT nasal spray INSTILL 2 SPRAYS INTO EACH NOSTRIL DAILY  16 g  11  . fluticasone (FLOVENT HFA) 110 MCG/ACT inhaler Inhale 2 puffs into the lungs daily.  1 Inhaler  1  . folic acid (FOLVITE) 1 MG tablet Take 1 mg by mouth daily.        Marland Kitchen glucose blood (ONE TOUCH ULTRA TEST) test strip Use to test blood sugar 1-2 times daily or as directed. Dx 250.00  200 each  3  . Insulin Pen  Needle (B-D UF III MINI PEN NEEDLES) 31G X 5 MM MISC Use as directed to inject insulin once daily as needed dx: 250.00  100 each  3  . LANOXIN 250 MCG tablet TAKE 1/2 TABLET BY MOUTH EVERY DAY  60 tablet  0  . meclizine (ANTIVERT) 25 MG tablet TAKE 1 TABLET BY MOUTH THREE TIMES DAILY AS NEEDED  90 tablet  0  . metFORMIN (GLUCOPHAGE) 500 MG tablet Take 1,000 mg by mouth 2 (two) times daily with a meal.       . metoprolol succinate (TOPROL-XL) 25 MG 24 hr tablet TAKE 1 TABLET BY MOUTH EVERY DAY  90 tablet  3  . Milk Thistle 500 MG CAPS Take by mouth daily.        . montelukast (SINGULAIR) 10 MG tablet Take 1 tablet (10 mg total) by mouth at bedtime.  30 tablet  11  . Multiple Vitamin (MULTIVITAMIN) capsule Take 1 capsule by mouth daily.        Marland Kitchen omeprazole (PRILOSEC) 20 MG capsule TAKE 1 CAPSULE BY  MOUTH EVERY DAY  90 capsule  0  . vitamin B-12 (CYANOCOBALAMIN) 100 MCG tablet Take 50 mcg by mouth daily.        . vitamin C (ASCORBIC ACID) 500 MG tablet Take 500 mg by mouth daily.        . Vitamins C E (CRANBERRY CONCENTRATE) 100-3 MG-UNIT CAPS Take 2 capsules by mouth daily.         No current facility-administered medications on file prior to visit.    Allergies  Allergen Reactions  . Cefdinir     REACTION: unspecified  . Clarithromycin     REACTION: weird dreams (on prednisone at the same time but has tolerated this in the past)  . Miglitol     REACTION: unspecified  . Niacin     REACTION: unspecified    Past Medical History  Diagnosis Date  . Allergy   . Asthma   . Diabetes mellitus   . GERD (gastroesophageal reflux disease)   . Hyperlipidemia   . Thyroid disease   . Atrial fibrillation   . Osteopenia   . Sleep disorder     Past Surgical History  Procedure Laterality Date  . Cholecystectomy    . Strabismus surgery  1946 / 1967  . Vaginal delivery      x2  . Cervical cone biopsy  1960's  . Nasal polyp surgery  01/01    Family History  Problem Relation Age of Onset   . Asthma Mother   . Coronary artery disease Father     History   Social History  . Marital Status: Married    Spouse Name: N/A    Number of Children: 2  . Years of Education: N/A   Occupational History  .     Social History Main Topics  . Smoking status: Never Smoker   . Smokeless tobacco: Never Used  . Alcohol Use: Yes  . Drug Use: Not on file  . Sexual Activity: Not on file   Other Topics Concern  . Not on file   Social History Narrative   Has living will   Not sure about formal POA --but requests husband, then daughter [name] (and son [name]) to be health care POAs   Would accept resuscitation attempts   Not sure about feeding tubes   Review of Systems Husband notes some noise at night--like a whistle No chest pain Omeprazole bid did quiet her reflux symptoms     Objective:   Physical Exam  Constitutional: She appears well-developed and well-nourished. No distress.  HENT:  Mouth/Throat: Oropharynx is clear and moist. No oropharyngeal exudate.   Moderate pale nasal congestion  Neck: Normal range of motion. Neck supple.  Pulmonary/Chest: Effort normal and breath sounds normal. No respiratory distress. She has no wheezes. She has no rales.  slight whistle but not tight No wheeze  Lymphadenopathy:    She has no cervical adenopathy.          Assessment & Plan:

## 2013-09-27 LAB — HEMOGLOBIN A1C: Hgb A1c MFr Bld: 6.9 % — AB (ref 4.0–6.0)

## 2013-10-15 ENCOUNTER — Ambulatory Visit (INDEPENDENT_AMBULATORY_CARE_PROVIDER_SITE_OTHER): Payer: Medicare PPO | Admitting: Pulmonary Disease

## 2013-10-15 ENCOUNTER — Encounter: Payer: Self-pay | Admitting: Pulmonary Disease

## 2013-10-15 VITALS — BP 132/70 | HR 80 | Temp 97.9°F | Ht 62.5 in | Wt 156.0 lb

## 2013-10-15 DIAGNOSIS — J45909 Unspecified asthma, uncomplicated: Secondary | ICD-10-CM | POA: Insufficient documentation

## 2013-10-15 DIAGNOSIS — R9389 Abnormal findings on diagnostic imaging of other specified body structures: Secondary | ICD-10-CM

## 2013-10-15 DIAGNOSIS — R059 Cough, unspecified: Secondary | ICD-10-CM

## 2013-10-15 DIAGNOSIS — J45901 Unspecified asthma with (acute) exacerbation: Secondary | ICD-10-CM

## 2013-10-15 DIAGNOSIS — J4530 Mild persistent asthma, uncomplicated: Secondary | ICD-10-CM

## 2013-10-15 DIAGNOSIS — R05 Cough: Secondary | ICD-10-CM

## 2013-10-15 DIAGNOSIS — J309 Allergic rhinitis, unspecified: Secondary | ICD-10-CM

## 2013-10-15 DIAGNOSIS — R918 Other nonspecific abnormal finding of lung field: Secondary | ICD-10-CM

## 2013-10-15 MED ORDER — BECLOMETHASONE DIPROPIONATE 80 MCG/ACT IN AERS: 2.0000 | INHALATION_SPRAY | Freq: Two times a day (BID) | RESPIRATORY_TRACT | Status: DC

## 2013-10-15 NOTE — Assessment & Plan Note (Signed)
Right now her symptoms are quiescent and her spirometry today was completely normal.  However, it sounds like her symptoms flare more in the spring and fall.  Plan: -change Flovent to QVar with a spacer 2 puffs bid starting in March (trying to precede her cough/asthma symptoms) -continue Singulair

## 2013-10-15 NOTE — Patient Instructions (Signed)
Your cough is due to allergic rhinitis (runny nose), asthma, and acid reflux  Stop using Flovent  In March 2015, start using the Flonase nasal spray two puffs in each nostril daily In March 2015, start using the QVar inhaler with a spacer 2 puffs twice a day  Continue using your omeprazole and singulair as written  We will see you back in April 2015. We will get a Chest X-ray prior to that visit

## 2013-10-15 NOTE — Assessment & Plan Note (Signed)
I have reviewed the images from the June and August 2014 Chest X-ray's myself and agree with Dr. Alphonsus Sias and radiology that this represents mucus/atelectasis alone.  Her husband is quit anxious about this so we had a long discussion about the risks and benefits of a CT scan.  We decided to repeat a Chest X-ray in 6 months with my next visit.

## 2013-10-15 NOTE — Progress Notes (Signed)
Subjective:    Patient ID: [name], female    DOB: [birth year], 79 y.o.   MRN: [id number]  HPI  This is a very pleasant Referred by Dr. Alphonsus Sias for cough.  She has had cough for at least 15 years ever since she was diagnosed with asthma in [place], [place].  At that time she was hospitalized for chest congestion and dyspnea.  It sounds like ever since then whether she was living in [place], [place] or [place] she has struggled with cough for in April and October of every year. Typically the cough is dry and lasts all day and all night.  It is associated with increased sinus drainage, chest tightness, and wheezing.  She rarely uses her albuterol inhaler but she will use it more often when she has the cough.  Dyspnea is not a primary feature of the illness and is rarely a problem for her.  She has had nasal polyps in the past which were removed.    This summer was particularly hard for her with a very long bout of cough and bronchitis.  She needed to be treated with prednisone twice.  She has acid reflux which is now controlled very well on omeprazole.  She was coughing more when Dr. Alphonsus Sias increased the dose of omeprazole and the cough has since improved.  In fact, she now has no cough.  She doesn't take her Flonase and Flovent regularly, only as needed.   Past Medical History  Diagnosis Date  . Allergy   . Asthma   . Diabetes mellitus   . GERD (gastroesophageal reflux disease)   . Hyperlipidemia   . Thyroid disease   . Atrial fibrillation   . Osteopenia   . Sleep disorder      Family History  Problem Relation Age of Onset  . Asthma Mother   . Coronary artery disease Father      History   Social History  . Marital Status: Married    Spouse Name: N/A    Number of Children: 2  . Years of Education: N/A   Occupational History  .     Social History Main Topics  . Smoking status: Never Smoker   . Smokeless tobacco: Never Used  . Alcohol Use: Yes     Comment:  1 glass of wine per day  . Drug Use: No  . Sexual Activity: Not on file   Other Topics Concern  . Not on file   Social History Narrative   Has living will   Not sure about formal POA --but requests husband, then daughter [name] (and son [name]) to be health care POAs   Would accept resuscitation attempts   Not sure about feeding tubes     Allergies  Allergen Reactions  . Cefdinir     REACTION: unspecified  . Clarithromycin     REACTION: weird dreams (on prednisone at the same time but has tolerated this in the past)  . Miglitol     REACTION: unspecified  . Niacin     REACTION: unspecified     Outpatient Prescriptions Prior to Visit  Medication Sig Dispense Refill  . albuterol (PROVENTIL HFA;VENTOLIN HFA) 108 (90 BASE) MCG/ACT inhaler Inhale 2 puffs into the lungs every 6 (six) hours as needed.  18 g  1  . ALPRAZolam (XANAX) 0.25 MG tablet TAKE 1 TABLET BY MOUTH TWICE DAILY AS NEEDED FOR SLEEP  100 tablet  0  . aspirin 81 MG tablet Take 81 mg  by mouth daily.        Marland Kitchen atorvastatin (LIPITOR) 20 MG tablet TAKE 1 TABLET BY MOUTH EVERY DAY  90 tablet  3  . B-D ULTRA-FINE 33 LANCETS MISC USE TWICE DAILY OR AS DIRECTED  100 each  6  . calcitonin, salmon, (MIACALCIN/FORTICAL) 200 UNIT/ACT nasal spray INSTILL 1 SPRAY INTO THE NOSE EVERY DAY  3.7 mL  2  . CALCIUM-VITAMIN D PO Take 1 tablet by mouth daily.        . Chromium 100 MCG TABS Take by mouth daily.        . Coenzyme Q10 200 MG capsule Take 200 mg by mouth daily.        Marland Kitchen EPINEPHrine (EPI-PEN) 0.3 mg/0.3 mL DEVI Inject 0.3 mLs (0.3 mg total) into the muscle once.  2 Device  1  . fish oil-omega-3 fatty acids 1000 MG capsule Take 1 g by mouth daily.        . fluticasone (FLONASE) 50 MCG/ACT nasal spray INSTILL 2 SPRAYS INTO EACH NOSTRIL DAILY  16 g  11  . fluticasone (FLOVENT HFA) 110 MCG/ACT inhaler Inhale 2 puffs into the lungs daily.  1 Inhaler  1  . folic acid (FOLVITE) 1 MG tablet Take 1 mg by mouth daily.        Marland Kitchen glucose  blood (ONE TOUCH ULTRA TEST) test strip Use to test blood sugar 1-2 times daily or as directed. Dx 250.00  200 each  3  . Insulin Glargine (LANTUS SOLOSTAR) 100 UNIT/ML SOPN Inject 20 Units into the skin every morning. 16 units in the evening      . Insulin Pen Needle (B-D UF III MINI PEN NEEDLES) 31G X 5 MM MISC Use as directed to inject insulin once daily as needed dx: 250.00  100 each  3  . LANOXIN 250 MCG tablet TAKE 1/2 TABLET BY MOUTH EVERY DAY  60 tablet  0  . levothyroxine (SYNTHROID, LEVOTHROID) 100 MCG tablet Take 100 mcg by mouth daily before breakfast.      . meclizine (ANTIVERT) 25 MG tablet TAKE 1 TABLET BY MOUTH THREE TIMES DAILY AS NEEDED  90 tablet  0  . metFORMIN (GLUCOPHAGE) 500 MG tablet Take 1,000 mg by mouth 2 (two) times daily with a meal.       . metoprolol succinate (TOPROL-XL) 25 MG 24 hr tablet TAKE 1 TABLET BY MOUTH EVERY DAY  90 tablet  3  . Milk Thistle 500 MG CAPS Take by mouth daily.        . montelukast (SINGULAIR) 10 MG tablet Take 1 tablet (10 mg total) by mouth at bedtime.  30 tablet  11  . Multiple Vitamin (MULTIVITAMIN) capsule Take 1 capsule by mouth daily.        Marland Kitchen omeprazole (PRILOSEC) 20 MG capsule TAKE 1 CAPSULE BY MOUTH EVERY DAY  90 capsule  0  . vitamin B-12 (CYANOCOBALAMIN) 100 MCG tablet Take 50 mcg by mouth daily.        . vitamin C (ASCORBIC ACID) 500 MG tablet Take 500 mg by mouth daily.        . Vitamins C E (CRANBERRY CONCENTRATE) 100-3 MG-UNIT CAPS Take 2 capsules by mouth daily.         No facility-administered medications prior to visit.      Review of Systems  Constitutional: Negative for fever and unexpected weight change.  HENT: Positive for postnasal drip and rhinorrhea. Negative for congestion, dental problem, ear pain, nosebleeds, sinus pressure,  sneezing, sore throat and trouble swallowing.   Eyes: Positive for pain and redness. Negative for itching.  Respiratory: Positive for cough. Negative for chest tightness, shortness of  breath and wheezing.   Cardiovascular: Positive for palpitations. Negative for leg swelling.  Gastrointestinal: Negative for nausea and vomiting.  Genitourinary: Negative for dysuria.  Musculoskeletal: Negative for joint swelling.  Skin: Negative for rash.  Neurological: Negative for headaches.  Hematological: Bruises/bleeds easily.  Psychiatric/Behavioral: Negative for dysphoric mood. The patient is not nervous/anxious.        Objective:   Physical Exam Filed Vitals:   10/15/13 1449  BP: 132/70  Pulse: 80  Temp: 97.9 F (36.6 C)  TempSrc: Oral  Height: 5' 2.5" (1.588 m)  Weight: 156 lb (70.761 kg)  SpO2: 96%  RA  Gen: well appearing, no acute distress HEENT: NCAT, PERRL, EOMi, OP clear, neck supple without masses PULM: CTA B CV: RRR, no mgr, no JVD AB: BS+, soft, nontender, no hsm Ext: warm, no edema, no clubbing, no cyanosis Derm: no rash or skin breakdown Neuro: A&Ox4, CN II-XII intact, strength 5/5 in all 4 extremities  06/2013 CXR > very slight atelectasis left lower lobe     Assessment & Plan:   Cough This is typically a problem for her in the spring and fall and is not causing problems right now.  This is most likely due to asthma, allergic rhinitis and GERD.  I think that allergies are the primary problem because she describes a lot of runny nose, chest tightness, and wheezing around the time of the cough.  Plan: -agree with increased dose of omeprazole -see allergic rhinitis and asthma  Asthma, chronic Right now her symptoms are quiescent and her spirometry today was completely normal.  However, it sounds like her symptoms flare more in the spring and fall.  Plan: -change Flovent to QVar with a spacer 2 puffs bid starting in March (trying to precede her cough/asthma symptoms) -continue Singulair  ALLERGIC RHINITIS Continue singulair Use Flonase 2 puffs each nare regularly daily starting middle of March 2015  Abnormal CXR (chest x-ray) I have  reviewed the images from the June and August 2014 Chest X-ray's myself and agree with Dr. Alphonsus Sias and radiology that this represents mucus/atelectasis alone.  Her husband is quit anxious about this so we had a long discussion about the risks and benefits of a CT scan.  We decided to repeat a Chest X-ray in 6 months with my next visit.   Updated Medication List Outpatient Encounter Prescriptions as of 10/15/2013  Medication Sig  . albuterol (PROVENTIL HFA;VENTOLIN HFA) 108 (90 BASE) MCG/ACT inhaler Inhale 2 puffs into the lungs every 6 (six) hours as needed.  . ALPRAZolam (XANAX) 0.25 MG tablet TAKE 1 TABLET BY MOUTH TWICE DAILY AS NEEDED FOR SLEEP  . aspirin 81 MG tablet Take 81 mg by mouth daily.    Marland Kitchen atorvastatin (LIPITOR) 20 MG tablet TAKE 1 TABLET BY MOUTH EVERY DAY  . B-D ULTRA-FINE 33 LANCETS MISC USE TWICE DAILY OR AS DIRECTED  . calcitonin, salmon, (MIACALCIN/FORTICAL) 200 UNIT/ACT nasal spray INSTILL 1 SPRAY INTO THE NOSE EVERY DAY  . CALCIUM-VITAMIN D PO Take 1 tablet by mouth daily.    . Chromium 100 MCG TABS Take by mouth daily.    . Coenzyme Q10 200 MG capsule Take 200 mg by mouth daily.    Marland Kitchen EPINEPHrine (EPI-PEN) 0.3 mg/0.3 mL DEVI Inject 0.3 mLs (0.3 mg total) into the muscle once.  . fish oil-omega-3  fatty acids 1000 MG capsule Take 1 g by mouth daily.    . fluticasone (FLONASE) 50 MCG/ACT nasal spray INSTILL 2 SPRAYS INTO EACH NOSTRIL DAILY  . folic acid (FOLVITE) 1 MG tablet Take 1 mg by mouth daily.    Marland Kitchen glucose blood (ONE TOUCH ULTRA TEST) test strip Use to test blood sugar 1-2 times daily or as directed. Dx 250.00  . Insulin Glargine (LANTUS SOLOSTAR) 100 UNIT/ML SOPN Inject 20 Units into the skin every morning. 16 units in the evening  . Insulin Pen Needle (B-D UF III MINI PEN NEEDLES) 31G X 5 MM MISC Use as directed to inject insulin once daily as needed dx: 250.00  . LANOXIN 250 MCG tablet TAKE 1/2 TABLET BY MOUTH EVERY DAY  . levothyroxine (SYNTHROID, LEVOTHROID) 100 MCG  tablet Take 100 mcg by mouth daily before breakfast.  . meclizine (ANTIVERT) 25 MG tablet TAKE 1 TABLET BY MOUTH THREE TIMES DAILY AS NEEDED  . metFORMIN (GLUCOPHAGE) 500 MG tablet Take 1,000 mg by mouth 2 (two) times daily with a meal.   . metoprolol succinate (TOPROL-XL) 25 MG 24 hr tablet TAKE 1 TABLET BY MOUTH EVERY DAY  . Milk Thistle 500 MG CAPS Take by mouth daily.    . montelukast (SINGULAIR) 10 MG tablet Take 1 tablet (10 mg total) by mouth at bedtime.  . Multiple Vitamin (MULTIVITAMIN) capsule Take 1 capsule by mouth daily.    Marland Kitchen omeprazole (PRILOSEC) 20 MG capsule TAKE 1 CAPSULE BY MOUTH EVERY DAY  . vitamin B-12 (CYANOCOBALAMIN) 100 MCG tablet Take 50 mcg by mouth daily.    . vitamin C (ASCORBIC ACID) 500 MG tablet Take 500 mg by mouth daily.    . Vitamins C E (CRANBERRY CONCENTRATE) 100-3 MG-UNIT CAPS Take 2 capsules by mouth daily.    . [DISCONTINUED] fluticasone (FLOVENT HFA) 110 MCG/ACT inhaler Inhale 2 puffs into the lungs daily.  . beclomethasone (QVAR) 80 MCG/ACT inhaler Inhale 2 puffs into the lungs 2 (two) times daily.

## 2013-10-15 NOTE — Assessment & Plan Note (Signed)
Continue singulair Use Flonase 2 puffs each nare regularly daily starting middle of March 2015

## 2013-10-15 NOTE — Assessment & Plan Note (Signed)
This is typically a problem for her in the spring and fall and is not causing problems right now.  This is most likely due to asthma, allergic rhinitis and GERD.  I think that allergies are the primary problem because she describes a lot of runny nose, chest tightness, and wheezing around the time of the cough.  Plan: -agree with increased dose of omeprazole -see allergic rhinitis and asthma

## 2013-10-16 ENCOUNTER — Other Ambulatory Visit: Payer: Self-pay | Admitting: Internal Medicine

## 2013-10-17 ENCOUNTER — Other Ambulatory Visit: Payer: Self-pay | Admitting: Internal Medicine

## 2013-10-17 ENCOUNTER — Other Ambulatory Visit: Payer: Self-pay | Admitting: Pulmonary Disease

## 2013-10-17 MED ORDER — BECLOMETHASONE DIPROPIONATE 80 MCG/ACT IN AERS: 2.0000 | INHALATION_SPRAY | Freq: Two times a day (BID) | RESPIRATORY_TRACT | Status: DC

## 2013-10-24 ENCOUNTER — Telehealth: Payer: Self-pay | Admitting: Internal Medicine

## 2013-10-24 ENCOUNTER — Other Ambulatory Visit: Payer: Self-pay | Admitting: Internal Medicine

## 2013-10-24 NOTE — Telephone Encounter (Signed)
Needs Rx for omeprazole changed with directions of taking Rx BID instead of once a day, pt was advise to increase Rx to BID but only got a refill saying take one tab qd so she is going though her medication to quickly

## 2013-10-25 MED ORDER — OMEPRAZOLE 20 MG PO CPDR: 20.0000 mg | DELAYED_RELEASE_CAPSULE | Freq: Two times a day (BID) | ORAL | Status: DC

## 2013-10-25 NOTE — Telephone Encounter (Signed)
rx sent to pharmacy by e-script  

## 2013-11-15 ENCOUNTER — Other Ambulatory Visit: Payer: Self-pay | Admitting: Internal Medicine

## 2013-11-18 ENCOUNTER — Institutional Professional Consult (permissible substitution): Payer: Medicare PPO | Admitting: Pulmonary Disease

## 2013-11-19 ENCOUNTER — Encounter: Payer: Self-pay | Admitting: Internal Medicine

## 2013-11-19 ENCOUNTER — Ambulatory Visit (INDEPENDENT_AMBULATORY_CARE_PROVIDER_SITE_OTHER): Payer: Medicare PPO | Admitting: Internal Medicine

## 2013-11-19 VITALS — BP 128/70 | HR 88 | Temp 98.5°F | Wt 156.0 lb

## 2013-11-19 DIAGNOSIS — E039 Hypothyroidism, unspecified: Secondary | ICD-10-CM

## 2013-11-19 DIAGNOSIS — E785 Hyperlipidemia, unspecified: Secondary | ICD-10-CM

## 2013-11-19 DIAGNOSIS — K219 Gastro-esophageal reflux disease without esophagitis: Secondary | ICD-10-CM

## 2013-11-19 DIAGNOSIS — J45909 Unspecified asthma, uncomplicated: Secondary | ICD-10-CM

## 2013-11-19 DIAGNOSIS — E119 Type 2 diabetes mellitus without complications: Secondary | ICD-10-CM

## 2013-11-19 DIAGNOSIS — I10 Essential (primary) hypertension: Secondary | ICD-10-CM

## 2013-11-19 LAB — CBC WITH DIFFERENTIAL/PLATELET
BASOS ABS: 0 10*3/uL (ref 0.0–0.1)
Basophils Relative: 0.3 % (ref 0.0–3.0)
Eosinophils Absolute: 0.6 10*3/uL (ref 0.0–0.7)
Eosinophils Relative: 6.8 % — ABNORMAL HIGH (ref 0.0–5.0)
HCT: 40.5 % (ref 36.0–46.0)
Hemoglobin: 13.7 g/dL (ref 12.0–15.0)
LYMPHS ABS: 2.6 10*3/uL (ref 0.7–4.0)
Lymphocytes Relative: 28.8 % (ref 12.0–46.0)
MCHC: 33.8 g/dL (ref 30.0–36.0)
MCV: 88.3 fl (ref 78.0–100.0)
Monocytes Absolute: 0.9 10*3/uL (ref 0.1–1.0)
Monocytes Relative: 9.3 % (ref 3.0–12.0)
NEUTROS PCT: 54.8 % (ref 43.0–77.0)
Neutro Abs: 5 10*3/uL (ref 1.4–7.7)
PLATELETS: 245 10*3/uL (ref 150.0–400.0)
RBC: 4.58 Mil/uL (ref 3.87–5.11)
RDW: 13.3 % (ref 11.5–14.6)
WBC: 9.2 10*3/uL (ref 4.5–10.5)

## 2013-11-19 LAB — BASIC METABOLIC PANEL
BUN: 18 mg/dL (ref 6–23)
CHLORIDE: 100 meq/L (ref 96–112)
CO2: 29 mEq/L (ref 19–32)
CREATININE: 0.8 mg/dL (ref 0.4–1.2)
Calcium: 9.4 mg/dL (ref 8.4–10.5)
GFR: 76.51 mL/min (ref >60.00)
GLUCOSE: 127 mg/dL — AB (ref 70–99)
Potassium: 4.3 mEq/L (ref 3.5–5.1)
Sodium: 138 mEq/L (ref 135–145)

## 2013-11-19 LAB — HEPATIC FUNCTION PANEL
ALT: 24 U/L (ref 0–35)
AST: 25 U/L (ref 0–37)
Albumin: 4.3 g/dL (ref 3.5–5.2)
Alkaline Phosphatase: 67 U/L (ref 39–117)
BILIRUBIN TOTAL: 0.4 mg/dL (ref 0.3–1.2)
Bilirubin, Direct: 0 mg/dL (ref 0.0–0.3)
Total Protein: 7.1 g/dL (ref 6.0–8.3)

## 2013-11-19 LAB — LIPID PANEL
CHOL/HDL RATIO: 2
Cholesterol: 105 mg/dL (ref 0–200)
HDL: 46.6 mg/dL (ref >39.00)
LDL Cholesterol: 32 mg/dL (ref 0–99)
Triglycerides: 131 mg/dL (ref 0.0–149.0)
VLDL: 26.2 mg/dL (ref 0.0–40.0)

## 2013-11-19 LAB — T4, FREE: Free T4: 1.19 ng/dL (ref 0.60–1.60)

## 2013-11-19 LAB — TSH: TSH: 0.37 u[IU]/mL (ref 0.35–5.50)

## 2013-11-19 NOTE — Progress Notes (Signed)
Subjective:    Patient ID: Hannah BelfastIrmgard P Simms, female    DOB: [birth year], 80 y.o.   MRN: [id number]  HPI Here with husband Doing well Enjoyed visit with Dr Kendrick FriesMcQuaid Changed the inhaled steroid---- will follow the CXR abnormality  Stomach has calmed down Generally takes the omeprazole bid (forgets the evening dose at times) Rare heartburn with pain to back---tums will clear it up  Still sees Dr Renae FicklePaul for her diabetes Has cut down on insulin for low sugars A1c down to 6.9%  Current Outpatient Prescriptions on File Prior to Visit  Medication Sig Dispense Refill  . albuterol (PROVENTIL HFA;VENTOLIN HFA) 108 (90 BASE) MCG/ACT inhaler Inhale 2 puffs into the lungs every 6 (six) hours as needed.  18 g  1  . ALPRAZolam (XANAX) 0.25 MG tablet TAKE 1 TABLET BY MOUTH TWICE DAILY AS NEEDED FOR SLEEP  100 tablet  0  . aspirin 81 MG tablet Take 81 mg by mouth daily.        Marland Kitchen. atorvastatin (LIPITOR) 20 MG tablet TAKE 1 TABLET BY MOUTH EVERY DAY  90 tablet  3  . B-D ULTRA-FINE 33 LANCETS MISC TEST BLOOD SUGAR TWICE DAILY OR AS DIRECTED  300 each  1  . beclomethasone (QVAR) 80 MCG/ACT inhaler Inhale 2 puffs into the lungs 2 (two) times daily.  3 Inhaler  1  . calcitonin, salmon, (MIACALCIN/FORTICAL) 200 UNIT/ACT nasal spray INSTILL 1 SPRAY INTO THE NOSE EVERY DAY  3.7 mL  2  . CALCIUM-VITAMIN D PO Take 1 tablet by mouth daily.        . Chromium 100 MCG TABS Take by mouth daily.        . Coenzyme Q10 200 MG capsule Take 200 mg by mouth daily.        Marland Kitchen. EPINEPHrine (EPI-PEN) 0.3 mg/0.3 mL DEVI Inject 0.3 mLs (0.3 mg total) into the muscle once.  2 Device  1  . fish oil-omega-3 fatty acids 1000 MG capsule Take 1 g by mouth daily.        . fluticasone (FLONASE) 50 MCG/ACT nasal spray INSTILL 2 SPRAYS INTO EACH NOSTRIL DAILY  16 g  11  . folic acid (FOLVITE) 1 MG tablet Take 1 mg by mouth daily.        Marland Kitchen. glucose blood (ONE TOUCH ULTRA TEST) test strip Use to test blood sugar 1-2 times daily or as directed. Dx  250.00  200 each  3  . Insulin Glargine (LANTUS SOLOSTAR) 100 UNIT/ML SOPN Inject 20 Units into the skin every morning. 16 units in the evening      . Insulin Pen Needle (B-D UF III MINI PEN NEEDLES) 31G X 5 MM MISC Use as directed to inject insulin once daily as needed dx: 250.00  100 each  3  . LANOXIN 250 MCG tablet TAKE 1/2 TABLET BY MOUTH EVERY DAY  60 tablet  0  . levothyroxine (SYNTHROID, LEVOTHROID) 100 MCG tablet Take 100 mcg by mouth daily before breakfast.      . metFORMIN (GLUCOPHAGE) 500 MG tablet Take 1,000 mg by mouth 2 (two) times daily with a meal.       . metoprolol succinate (TOPROL-XL) 25 MG 24 hr tablet TAKE 1 TABLET BY MOUTH EVERY DAY  90 tablet  3  . Milk Thistle 500 MG CAPS Take by mouth daily.        . montelukast (SINGULAIR) 10 MG tablet Take 1 tablet (10 mg total) by mouth at bedtime.  30 tablet  11  . Multiple Vitamin (MULTIVITAMIN) capsule Take 1 capsule by mouth daily.        Marland Kitchen omeprazole (PRILOSEC) 20 MG capsule Take 1 capsule (20 mg total) by mouth 2 (two) times daily before a meal.  180 capsule  3  . TRAVEL SICKNESS 25 MG CHEW CHEW AND SWALLOW 1 TABLET BY MOUTH THREE TIMES DAILY AS NEEDED  90 tablet  0  . vitamin B-12 (CYANOCOBALAMIN) 100 MCG tablet Take 50 mcg by mouth daily.        . vitamin C (ASCORBIC ACID) 500 MG tablet Take 500 mg by mouth daily.        . Vitamins C E (CRANBERRY CONCENTRATE) 100-3 MG-UNIT CAPS Take 2 capsules by mouth daily.         No current facility-administered medications on file prior to visit.    Allergies  Allergen Reactions  . Cefdinir     REACTION: unspecified  . Clarithromycin     REACTION: weird dreams (on prednisone at the same time but has tolerated this in the past)  . Miglitol     REACTION: unspecified  . Niacin     REACTION: unspecified    Past Medical History  Diagnosis Date  . Allergy   . Asthma   . Diabetes mellitus   . GERD (gastroesophageal reflux disease)   . Hyperlipidemia   . Thyroid disease   .  Atrial fibrillation   . Osteopenia   . Sleep disorder     Past Surgical History  Procedure Laterality Date  . Cholecystectomy    . Strabismus surgery  1946 / 1967  . Vaginal delivery      x2  . Cervical cone biopsy  1960's  . Nasal polyp surgery  01/01    Family History  Problem Relation Age of Onset  . Asthma Mother   . Coronary artery disease Father     History   Social History  . Marital Status: Married    Spouse Name: N/A    Number of Children: 2  . Years of Education: N/A   Occupational History  .     Social History Main Topics  . Smoking status: Never Smoker   . Smokeless tobacco: Never Used  . Alcohol Use: Yes     Comment: 1 glass of wine per day  . Drug Use: No  . Sexual Activity: Not on file   Other Topics Concern  . Not on file   Social History Narrative   Has living will   Not sure about formal POA --but requests husband, then daughter [name] (and son [name]) to be health care POAs   Would accept resuscitation attempts   Not sure about feeding tubes   Review of Systems Weight is stable Bowels are okay---discussed not repeating colonoscopy (occasional diarrhea--will use 1/2 imodium) Sleeps okay    Objective:   Physical Exam  Constitutional: She appears well-developed and well-nourished. No distress.  Neck: Normal range of motion. Neck supple.  Cardiovascular: Normal rate, regular rhythm, normal heart sounds and intact distal pulses.  Exam reveals no gallop.   No murmur heard. Pulmonary/Chest: Effort normal and breath sounds normal. No respiratory distress. She has no wheezes. She has no rales.  Musculoskeletal: She exhibits no edema and no tenderness.  Lymphadenopathy:    She has no cervical adenopathy.  Skin:  No foot lesions  Psychiatric: She has a normal mood and affect. Her behavior is normal.          Assessment &  Plan:

## 2013-11-19 NOTE — Assessment & Plan Note (Signed)
BP Readings from Last 3 Encounters:  11/19/13 128/70  10/15/13 132/70  09/23/13 140/80   Good control

## 2013-11-19 NOTE — Assessment & Plan Note (Signed)
Feels her breathing and cough are better

## 2013-11-19 NOTE — Assessment & Plan Note (Signed)
Doing well with Dr Renae FicklePaul

## 2013-11-19 NOTE — Assessment & Plan Note (Signed)
No problems with the med Due for labs 

## 2013-11-19 NOTE — Assessment & Plan Note (Signed)
Symptoms mostly resolved on the bid omeprazole

## 2013-11-20 ENCOUNTER — Telehealth: Payer: Self-pay

## 2013-11-20 ENCOUNTER — Telehealth: Payer: Self-pay | Admitting: Internal Medicine

## 2013-11-20 NOTE — Telephone Encounter (Signed)
Relevant patient education assigned to patient using Emmi. ° °

## 2014-01-15 ENCOUNTER — Other Ambulatory Visit: Payer: Self-pay | Admitting: *Deleted

## 2014-01-15 NOTE — Telephone Encounter (Signed)
06/20/13 

## 2014-01-15 NOTE — Telephone Encounter (Signed)
Okay #100 x 0 1 bid prn

## 2014-01-16 MED ORDER — ALPRAZOLAM 0.25 MG PO TABS
0.2500 mg | ORAL_TABLET | Freq: Two times a day (BID) | ORAL | Status: DC | PRN
Start: ? — End: 2014-08-07

## 2014-01-16 NOTE — Telephone Encounter (Signed)
rx called into pharmacy

## 2014-01-22 ENCOUNTER — Other Ambulatory Visit: Payer: Self-pay | Admitting: Internal Medicine

## 2014-01-24 ENCOUNTER — Ambulatory Visit: Payer: Medicare PPO | Admitting: Family Medicine

## 2014-01-27 ENCOUNTER — Encounter: Payer: Self-pay | Admitting: Internal Medicine

## 2014-01-27 ENCOUNTER — Ambulatory Visit (INDEPENDENT_AMBULATORY_CARE_PROVIDER_SITE_OTHER): Payer: Medicare PPO | Admitting: Internal Medicine

## 2014-01-27 VITALS — BP 130/70 | HR 88 | Temp 98.2°F | Wt 159.0 lb

## 2014-01-27 DIAGNOSIS — R2240 Localized swelling, mass and lump, unspecified lower limb: Secondary | ICD-10-CM

## 2014-01-27 DIAGNOSIS — R229 Localized swelling, mass and lump, unspecified: Secondary | ICD-10-CM

## 2014-01-27 NOTE — Progress Notes (Signed)
Subjective:    Patient ID: [name], female    DOB: Mar 28, 1931, 80 y.o.   MRN: [id number]  HPI Here with husband  Noticed a lump on plantar left foot last week--- near distal prart of 4th metatarsal Some pain Seems smaller and less tender Wonders if it is related to walking in bare feet  Current Outpatient Prescriptions on File Prior to Visit  Medication Sig Dispense Refill  . albuterol (PROVENTIL HFA;VENTOLIN HFA) 108 (90 BASE) MCG/ACT inhaler Inhale 2 puffs into the lungs every 6 (six) hours as needed.  18 g  1  . ALPRAZolam (XANAX) 0.25 MG tablet Take 1 tablet (0.25 mg total) by mouth 2 (two) times daily as needed.  100 tablet  0  . aspirin 81 MG tablet Take 81 mg by mouth daily.        Marland Kitchen atorvastatin (LIPITOR) 20 MG tablet TAKE 1 TABLET BY MOUTH EVERY DAY  90 tablet  3  . B-D UF III MINI PEN NEEDLES 31G X 5 MM MISC USE WITH INSULIN PEN ONCE A DAY AS DIRECTED  100 each  0  . B-D ULTRA-FINE 33 LANCETS MISC TEST BLOOD SUGAR TWICE DAILY OR AS DIRECTED  300 each  1  . beclomethasone (QVAR) 80 MCG/ACT inhaler Inhale 2 puffs into the lungs 2 (two) times daily.  3 Inhaler  1  . calcitonin, salmon, (MIACALCIN/FORTICAL) 200 UNIT/ACT nasal spray INSTILL 1 SPRAY INTO THE NOSE EVERY DAY  3.7 mL  2  . CALCIUM-VITAMIN D PO Take 1 tablet by mouth daily.        . Chromium 100 MCG TABS Take by mouth daily.        . Coenzyme Q10 200 MG capsule Take 200 mg by mouth daily.        . fish oil-omega-3 fatty acids 1000 MG capsule Take 1 g by mouth daily.        . fluticasone (FLONASE) 50 MCG/ACT nasal spray INSTILL 2 SPRAYS INTO EACH NOSTRIL DAILY  16 g  11  . folic acid (FOLVITE) 1 MG tablet Take 1 mg by mouth daily.        Marland Kitchen glucose blood (ONE TOUCH ULTRA TEST) test strip Use to test blood sugar 1-2 times daily or as directed. Dx 250.00  200 each  3  . Insulin Glargine (LANTUS SOLOSTAR) 100 UNIT/ML SOPN Inject 20 Units into the skin every morning. 16 units in the evening      . LANOXIN 250 MCG  tablet TAKE 1/2 TABLET BY MOUTH EVERY DAY  60 tablet  0  . levothyroxine (SYNTHROID, LEVOTHROID) 100 MCG tablet Take 100 mcg by mouth daily before breakfast.      . metFORMIN (GLUCOPHAGE) 500 MG tablet Take 1,000 mg by mouth 2 (two) times daily with a meal.       . metoprolol succinate (TOPROL-XL) 25 MG 24 hr tablet TAKE 1 TABLET BY MOUTH EVERY DAY  90 tablet  3  . Milk Thistle 500 MG CAPS Take by mouth daily.        . montelukast (SINGULAIR) 10 MG tablet Take 1 tablet (10 mg total) by mouth at bedtime.  30 tablet  11  . Multiple Vitamin (MULTIVITAMIN) capsule Take 1 capsule by mouth daily.        Marland Kitchen omeprazole (PRILOSEC) 20 MG capsule Take 1 capsule (20 mg total) by mouth 2 (two) times daily before a meal.  180 capsule  3  . TRAVEL SICKNESS 25 MG CHEW CHEW  AND SWALLOW 1 TABLET BY MOUTH THREE TIMES DAILY AS NEEDED  90 tablet  0  . vitamin B-12 (CYANOCOBALAMIN) 100 MCG tablet Take 50 mcg by mouth daily.        . vitamin C (ASCORBIC ACID) 500 MG tablet Take 500 mg by mouth daily.        . Vitamins C E (CRANBERRY CONCENTRATE) 100-3 MG-UNIT CAPS Take 2 capsules by mouth daily.        Marland Kitchen. EPINEPHrine (EPI-PEN) 0.3 mg/0.3 mL DEVI Inject 0.3 mLs (0.3 mg total) into the muscle once.  2 Device  1   No current facility-administered medications on file prior to visit.    Allergies  Allergen Reactions  . Cefdinir     REACTION: unspecified  . Clarithromycin     REACTION: weird dreams (on prednisone at the same time but has tolerated this in the past)  . Miglitol     REACTION: unspecified  . Niacin     REACTION: unspecified    Past Medical History  Diagnosis Date  . Allergy   . Asthma   . Diabetes mellitus   . GERD (gastroesophageal reflux disease)   . Hyperlipidemia   . Thyroid disease   . Atrial fibrillation   . Osteopenia   . Sleep disorder     Past Surgical History  Procedure Laterality Date  . Cholecystectomy    . Strabismus surgery  1946 / 1967  . Vaginal delivery      x2  .  Cervical cone biopsy  1960's  . Nasal polyp surgery  01/01    Family History  Problem Relation Age of Onset  . Asthma Mother   . Coronary artery disease Father     History   Social History  . Marital Status: Married    Spouse Name: N/A    Number of Children: 2  . Years of Education: N/A   Occupational History  .     Social History Main Topics  . Smoking status: Never Smoker   . Smokeless tobacco: Never Used  . Alcohol Use: Yes     Comment: 1 glass of wine per day  . Drug Use: No  . Sexual Activity: Not on file   Other Topics Concern  . Not on file   Social History Narrative   Has living will   Not sure about formal POA --but requests husband, then daughter [name] (and son Francis DowseJoel) to be health care POAs   Would accept resuscitation attempts   Not sure about feeding tubes   Review of Systems Mild redness at left 1st MTP---relates to past high heels Not sick No fever    Objective:   Physical Exam  Constitutional: She appears well-developed and well-nourished. No distress.  Musculoskeletal:  Small vague mass over distal plantar 4th left metatarsal Not inflamed or tender          Assessment & Plan:

## 2014-01-27 NOTE — Assessment & Plan Note (Signed)
Small and going away Not clearly a cyst May have been inflamed area Certainly nothing to worry about Reassured  ?try warm compress or ice if worsens

## 2014-01-27 NOTE — Progress Notes (Signed)
Pre visit review using our clinic review tool, if applicable. No additional management support is needed unless otherwise documented below in the visit note. 

## 2014-01-28 ENCOUNTER — Ambulatory Visit: Payer: Self-pay | Admitting: Ophthalmology

## 2014-02-11 ENCOUNTER — Ambulatory Visit: Payer: Self-pay | Admitting: Ophthalmology

## 2014-02-15 ENCOUNTER — Other Ambulatory Visit: Payer: Self-pay | Admitting: Internal Medicine

## 2014-02-16 ENCOUNTER — Other Ambulatory Visit: Payer: Self-pay | Admitting: Internal Medicine

## 2014-02-17 ENCOUNTER — Other Ambulatory Visit: Payer: Self-pay | Admitting: Internal Medicine

## 2014-03-17 ENCOUNTER — Other Ambulatory Visit: Payer: Self-pay | Admitting: Internal Medicine

## 2014-03-29 ENCOUNTER — Other Ambulatory Visit: Payer: Self-pay | Admitting: Internal Medicine

## 2014-04-03 ENCOUNTER — Encounter: Payer: Self-pay | Admitting: Internal Medicine

## 2014-04-03 ENCOUNTER — Ambulatory Visit (INDEPENDENT_AMBULATORY_CARE_PROVIDER_SITE_OTHER): Payer: Medicare PPO | Admitting: Internal Medicine

## 2014-04-03 VITALS — BP 130/70 | HR 82 | Temp 98.6°F | Wt 155.0 lb

## 2014-04-03 DIAGNOSIS — R221 Localized swelling, mass and lump, neck: Secondary | ICD-10-CM

## 2014-04-03 DIAGNOSIS — R220 Localized swelling, mass and lump, head: Secondary | ICD-10-CM | POA: Insufficient documentation

## 2014-04-03 NOTE — Progress Notes (Signed)
Subjective:    Patient ID: [name], female    DOB: 07/28/1931, 80 y.o.   MRN: [id number]  HPI Here with husband Has small lump in front of right ear Noticed it 2-3 days ago No pain No pain with chewing  No rash  Current Outpatient Prescriptions on File Prior to Visit  Medication Sig Dispense Refill  . albuterol (PROVENTIL HFA;VENTOLIN HFA) 108 (90 BASE) MCG/ACT inhaler Inhale 2 puffs into the lungs every 6 (six) hours as needed.  18 g  1  . ALPRAZolam (XANAX) 0.25 MG tablet Take 1 tablet (0.25 mg total) by mouth 2 (two) times daily as needed.  100 tablet  0  . aspirin 81 MG tablet Take 81 mg by mouth daily.        Marland Kitchen. atorvastatin (LIPITOR) 20 MG tablet TAKE 1 TABLET BY MOUTH EVERY DAY  90 tablet  3  . B-D UF III MINI PEN NEEDLES 31G X 5 MM MISC USE WITH INSULIN SYRINGES EVERY DAY AS DIRECTED  100 each  0  . B-D ULTRA-FINE 33 LANCETS MISC TEST BLOOD SUGAR TWICE DAILY OR AS DIRECTED  300 each  1  . beclomethasone (QVAR) 80 MCG/ACT inhaler Inhale 2 puffs into the lungs 2 (two) times daily.  3 Inhaler  1  . calcitonin, salmon, (MIACALCIN/FORTICAL) 200 UNIT/ACT nasal spray USE 1 SPRAY IN THE NOSE EVERY DAY  3.7 mL  0  . CALCIUM-VITAMIN D PO Take 1 tablet by mouth daily.        . Chromium 100 MCG TABS Take by mouth daily.        . Coenzyme Q10 200 MG capsule Take 200 mg by mouth daily.        Marland Kitchen. EPINEPHrine (EPI-PEN) 0.3 mg/0.3 mL DEVI Inject 0.3 mLs (0.3 mg total) into the muscle once.  2 Device  1  . fish oil-omega-3 fatty acids 1000 MG capsule Take 1 g by mouth daily.        . fluticasone (FLONASE) 50 MCG/ACT nasal spray INSTILL 2 SPRAYS INTO EACH NOSTRIL DAILY  16 g  11  . folic acid (FOLVITE) 1 MG tablet Take 1 mg by mouth daily.        Marland Kitchen. glucose blood (ONE TOUCH ULTRA TEST) test strip Use to test blood sugar 1-2 times daily or as directed. Dx 250.00  200 each  3  . Insulin Glargine (LANTUS SOLOSTAR) 100 UNIT/ML SOPN Inject 20 Units into the skin every morning. 16 units in the  evening      . LANOXIN 250 MCG tablet TAKE 1/2 TABLET BY MOUTH EVERY DAY  60 tablet  0  . levothyroxine (SYNTHROID, LEVOTHROID) 100 MCG tablet Take 100 mcg by mouth daily before breakfast.      . metFORMIN (GLUCOPHAGE) 500 MG tablet Take 1,000 mg by mouth 2 (two) times daily with a meal.       . metoprolol succinate (TOPROL-XL) 25 MG 24 hr tablet TAKE 1 TABLET BY MOUTH EVERY DAY  90 tablet  3  . Milk Thistle 500 MG CAPS Take by mouth daily.        . montelukast (SINGULAIR) 10 MG tablet TAKE 1 TABLET BY MOUTH EVERY NIGHT AT BEDTIME  90 tablet  0  . Multiple Vitamin (MULTIVITAMIN) capsule Take 1 capsule by mouth daily.        Marland Kitchen. omeprazole (PRILOSEC) 20 MG capsule Take 1 capsule (20 mg total) by mouth 2 (two) times daily before a meal.  180 capsule  3  . TRAVEL SICKNESS 25 MG CHEW TAKE 1 TABLET BY MOUTH THREE TIMES DAILY AS NEEDED  90 tablet  0  . vitamin B-12 (CYANOCOBALAMIN) 100 MCG tablet Take 50 mcg by mouth daily.        . vitamin C (ASCORBIC ACID) 500 MG tablet Take 500 mg by mouth daily.        . Vitamins C E (CRANBERRY CONCENTRATE) 100-3 MG-UNIT CAPS Take 2 capsules by mouth daily.         No current facility-administered medications on file prior to visit.    Allergies  Allergen Reactions  . Cefdinir     REACTION: unspecified  . Clarithromycin     REACTION: weird dreams (on prednisone at the same time but has tolerated this in the past)  . Miglitol     REACTION: unspecified  . Niacin     REACTION: unspecified    Past Medical History  Diagnosis Date  . Allergy   . Asthma   . Diabetes mellitus   . GERD (gastroesophageal reflux disease)   . Hyperlipidemia   . Thyroid disease   . Atrial fibrillation   . Osteopenia   . Sleep disorder     Past Surgical History  Procedure Laterality Date  . Cholecystectomy    . Strabismus surgery  1946 / 1967  . Vaginal delivery      x2  . Cervical cone biopsy  1960's  . Nasal polyp surgery  01/01    Family History  Problem  Relation Age of Onset  . Asthma Mother   . Coronary artery disease Father     History   Social History  . Marital Status: Married    Spouse Name: N/A    Number of Children: 2  . Years of Education: N/A   Occupational History  .     Social History Main Topics  . Smoking status: Never Smoker   . Smokeless tobacco: Never Used  . Alcohol Use: Yes     Comment: 1 glass of wine per day  . Drug Use: No  . Sexual Activity: Not on file   Other Topics Concern  . Not on file   Social History Narrative   Has living will   Not sure about formal POA --but requests husband, then daughter [name] (and son [name]) to be health care POAs   Would accept resuscitation attempts   Not sure about feeding tubes   Review of Systems No fever Not sick     Objective:   Physical Exam  HENT:  Has slight soft tissue prominence at the angle of the right mandible Not node Mildly tender No inflammation          Assessment & Plan:

## 2014-04-03 NOTE — Progress Notes (Signed)
Pre visit review using our clinic review tool, if applicable. No additional management support is needed unless otherwise documented below in the visit note. 

## 2014-04-03 NOTE — Assessment & Plan Note (Signed)
Feels like soft tissue area by mandible--not node or parotid May be some inflammation  Reassured --doesn't seem to be something worrisome She will just keep track of it

## 2014-05-14 ENCOUNTER — Other Ambulatory Visit: Payer: Self-pay | Admitting: Internal Medicine

## 2014-05-27 ENCOUNTER — Encounter: Payer: Self-pay | Admitting: Pulmonary Disease

## 2014-05-27 ENCOUNTER — Ambulatory Visit (INDEPENDENT_AMBULATORY_CARE_PROVIDER_SITE_OTHER): Payer: Medicare PPO | Admitting: Pulmonary Disease

## 2014-05-27 VITALS — BP 138/76 | HR 75 | Ht 62.5 in | Wt 158.0 lb

## 2014-05-27 DIAGNOSIS — J984 Other disorders of lung: Secondary | ICD-10-CM

## 2014-05-27 DIAGNOSIS — J4520 Mild intermittent asthma, uncomplicated: Secondary | ICD-10-CM

## 2014-05-27 DIAGNOSIS — R9389 Abnormal findings on diagnostic imaging of other specified body structures: Secondary | ICD-10-CM

## 2014-05-27 DIAGNOSIS — J45909 Unspecified asthma, uncomplicated: Secondary | ICD-10-CM

## 2014-05-27 NOTE — Patient Instructions (Signed)
I recommend you use over the counter nasacort 2 puffs each nostril daily in the allergy season Continue using the singulair in the allergy season We will order a Chest X-ray to follow up the left lung scar We will see you back as needed

## 2014-05-27 NOTE — Assessment & Plan Note (Signed)
This has been a stable interval. Most of her symptoms are related to allergic rhinitis.  Plan: -Okay to stay off of the Qvar -Continue allergic rhinitis regimen -Followup with me when necessary

## 2014-05-27 NOTE — Progress Notes (Signed)
Subjective:    Patient ID: [name], female    DOB: 05-Dec-1930, 80 y.o.   MRN: [id number]  Synopsis: Mild intermittent asthma and allergic rhinitis both of which are seasonal in nature   HPI  05/27/2014 ROV > Ms. Baird LyonsCasey had a  Good year in the spring without too much difficulty.  She thinks that the singulair helped more than anything else.  She had no post nasal drip.  She has not had to use the QVar.  No cough or mucus production.  No chest pain and her weight has been stable.    Past Medical History  Diagnosis Date  . Allergy   . Asthma   . Diabetes mellitus   . GERD (gastroesophageal reflux disease)   . Hyperlipidemia   . Thyroid disease   . Atrial fibrillation   . Osteopenia   . Sleep disorder      Review of Systems     Objective:   Physical Exam Filed Vitals:   05/27/14 1155  BP: 138/76  Pulse: 75  Height: 5' 2.5" (1.588 m)  Weight: 158 lb (71.668 kg)  SpO2: 99%   RA  Gen: well appearing, no acute distress HEENT: NCAT, EOMi, OP clear PULM: CTA B CV: RRR, no mgr, no JVD AB: BS+, soft, nontender, no hsm Ext: warm, no edema, no clubbing, no cyanosis     Assessment & Plan:   Asthma, chronic This has been a stable interval. Most of her symptoms are related to allergic rhinitis.  Plan: -Okay to stay off of the Qvar -Continue allergic rhinitis regimen -Followup with me when necessary  Abnormal CXR (chest x-ray) She had a very small scar in the left lung seen on her last chest x-ray. We will repeat a chest x-ray today to ensure that it has either resolved or remained stable.    Updated Medication List Outpatient Encounter Prescriptions as of 05/27/2014  Medication Sig  . albuterol (PROVENTIL HFA;VENTOLIN HFA) 108 (90 BASE) MCG/ACT inhaler Inhale 2 puffs into the lungs every 6 (six) hours as needed.  . ALPRAZolam (XANAX) 0.25 MG tablet Take 1 tablet (0.25 mg total) by mouth 2 (two) times daily as needed.  Marland Kitchen. aspirin 81 MG tablet Take 81 mg by mouth  daily.    Marland Kitchen. atorvastatin (LIPITOR) 20 MG tablet TAKE 1 TABLET BY MOUTH EVERY DAY  . B-D UF III MINI PEN NEEDLES 31G X 5 MM MISC USE WITH INSULIN SYRINGES EVERY DAY AS DIRECTED  . B-D ULTRA-FINE 33 LANCETS MISC TEST BLOOD SUGAR TWICE DAILY OR AS DIRECTED  . calcitonin, salmon, (MIACALCIN/FORTICAL) 200 UNIT/ACT nasal spray USE 1 SPRAY IN THE NOSE EVERY DAY  . CALCIUM-VITAMIN D PO Take 1 tablet by mouth daily.    . Chromium 100 MCG TABS Take by mouth daily.    . Coenzyme Q10 200 MG capsule Take 200 mg by mouth daily.    . fish oil-omega-3 fatty acids 1000 MG capsule Take 1 g by mouth daily.    . fluticasone (FLONASE) 50 MCG/ACT nasal spray INSTILL 2 SPRAYS INTO EACH NOSTRIL DAILY..the patient  uses prn  . folic acid (FOLVITE) 1 MG tablet Take 1 mg by mouth daily.    Marland Kitchen. glucose blood (ONE TOUCH ULTRA TEST) test strip Use to test blood sugar 1-2 times daily or as directed. Dx 250.00  . Insulin Glargine (LANTUS SOLOSTAR) 100 UNIT/ML SOPN Inject 20 Units into the skin every morning. 16 units in the evening  . LANOXIN 250 MCG tablet  TAKE 1/2 TABLET BY MOUTH EVERY DAY  . levothyroxine (SYNTHROID, LEVOTHROID) 100 MCG tablet Take 100 mcg by mouth daily before breakfast.  . metFORMIN (GLUCOPHAGE) 500 MG tablet Take 1,000 mg by mouth 2 (two) times daily with a meal.   . metoprolol succinate (TOPROL-XL) 25 MG 24 hr tablet TAKE 1 TABLET BY MOUTH EVERY DAY  . Milk Thistle 500 MG CAPS Take by mouth daily.    . montelukast (SINGULAIR) 10 MG tablet TAKE 1 TABLET BY MOUTH EVERY NIGHT AT BEDTIME  . Multiple Vitamin (MULTIVITAMIN) capsule Take 1 capsule by mouth daily.    Marland Kitchen omeprazole (PRILOSEC) 20 MG capsule Take 1 capsule (20 mg total) by mouth 2 (two) times daily before a meal.  . TRAVEL SICKNESS 25 MG CHEW TAKE 1 TABLET BY MOUTH THREE TIMES DAILY AS NEEDED  . vitamin B-12 (CYANOCOBALAMIN) 100 MCG tablet Take 50 mcg by mouth daily.    . vitamin C (ASCORBIC ACID) 500 MG tablet Take 500 mg by mouth daily.    .  Vitamins C E (CRANBERRY CONCENTRATE) 100-3 MG-UNIT CAPS Take 2 capsules by mouth daily.    . [DISCONTINUED] fluticasone (FLONASE) 50 MCG/ACT nasal spray INSTILL 2 SPRAYS INTO EACH NOSTRIL DAILY  . beclomethasone (QVAR) 80 MCG/ACT inhaler Inhale 2 puffs into the lungs 2 (two) times daily.  Marland Kitchen EPINEPHrine (EPI-PEN) 0.3 mg/0.3 mL DEVI Inject 0.3 mLs (0.3 mg total) into the muscle once.

## 2014-05-27 NOTE — Assessment & Plan Note (Signed)
She had a very small scar in the left lung seen on her last chest x-ray. We will repeat a chest x-ray today to ensure that it has either resolved or remained stable.

## 2014-05-29 ENCOUNTER — Telehealth: Payer: Self-pay

## 2014-05-29 ENCOUNTER — Ambulatory Visit (INDEPENDENT_AMBULATORY_CARE_PROVIDER_SITE_OTHER)
Admission: RE | Admit: 2014-05-29 | Discharge: 2014-05-29 | Disposition: A | Discharge status: Home / Self Care | Payer: Medicare PPO | Source: Ambulatory Visit | Attending: Pulmonary Disease | Admitting: Pulmonary Disease

## 2014-05-29 DIAGNOSIS — J984 Other disorders of lung: Secondary | ICD-10-CM

## 2014-05-29 NOTE — Telephone Encounter (Signed)
Pt was in office today for xray and has already scheduled appt to see Dr Reece AgarG on 05/30/14 at [facility] for small red spot on upper rt chest area. Pt said about 10 days ago thinks was bitten by insect; pt has used OTC cortisone cream and bandaid for 4 days. Pt thought looked better but now still itching, no pain unless scratch the area and no drainage. Pt has not had fever and no nausea. Pt will keep appt with Dr Reece AgarG on 05/30/14 and if condition changes or worsens will go to UC. Pt voiced understanding.

## 2014-05-30 ENCOUNTER — Ambulatory Visit (INDEPENDENT_AMBULATORY_CARE_PROVIDER_SITE_OTHER): Payer: Medicare PPO | Admitting: Family Medicine

## 2014-05-30 ENCOUNTER — Encounter: Payer: Self-pay | Admitting: Family Medicine

## 2014-05-30 VITALS — BP 160/70 | HR 54 | Temp 97.9°F | Wt 155.5 lb

## 2014-05-30 DIAGNOSIS — T148XXA Other injury of unspecified body region, initial encounter: Secondary | ICD-10-CM | POA: Insufficient documentation

## 2014-05-30 DIAGNOSIS — R238 Other skin changes: Secondary | ICD-10-CM

## 2014-05-30 NOTE — Progress Notes (Signed)
Pre visit review using our clinic review tool, if applicable. No additional management support is needed unless otherwise documented below in the visit note. 

## 2014-05-30 NOTE — Progress Notes (Signed)
BP 160/70  Pulse 54  Temp(Src) 97.9 F (36.6 C) (Oral)  Wt 155 lb 8 oz (70.534 kg)   CC: check bump on skin  Subjective:    Patient ID: [name], female    DOB: July 26, 1931, 80 y.o.   MRN: [id number]  HPI: MANROOP JAKUBOWICZ is a 80 y.o. female presenting on 05/30/2014 for Check bump on skin   Would like spot on right chest checked today. Present for last 10 days. Thinks started as insect bite.  Has been treating with alcohol and cortisone cream, with bandaid. Has been oozing some as well. Not tender, not itchy.  Has not been using anything over last few days.  Elevated bp today - denies nerves. On toprol xl and compliant.  Relevant past medical, surgical, family and social history reviewed and updated as indicated.  Allergies and medications reviewed and updated. Current Outpatient Prescriptions on File Prior to Visit  Medication Sig  . albuterol (PROVENTIL HFA;VENTOLIN HFA) 108 (90 BASE) MCG/ACT inhaler Inhale 2 puffs into the lungs every 6 (six) hours as needed.  . ALPRAZolam (XANAX) 0.25 MG tablet Take 1 tablet (0.25 mg total) by mouth 2 (two) times daily as needed.  Marland Kitchen aspirin 81 MG tablet Take 81 mg by mouth daily.    Marland Kitchen atorvastatin (LIPITOR) 20 MG tablet TAKE 1 TABLET BY MOUTH EVERY DAY  . B-D UF III MINI PEN NEEDLES 31G X 5 MM MISC USE WITH INSULIN SYRINGES EVERY DAY AS DIRECTED  . B-D ULTRA-FINE 33 LANCETS MISC TEST BLOOD SUGAR TWICE DAILY OR AS DIRECTED  . calcitonin, salmon, (MIACALCIN/FORTICAL) 200 UNIT/ACT nasal spray USE 1 SPRAY IN THE NOSE EVERY DAY  . CALCIUM-VITAMIN D PO Take 1 tablet by mouth daily.    . Chromium 100 MCG TABS Take by mouth daily.    . Coenzyme Q10 200 MG capsule Take 200 mg by mouth daily.    Marland Kitchen EPINEPHrine (EPI-PEN) 0.3 mg/0.3 mL DEVI Inject 0.3 mLs (0.3 mg total) into the muscle once.  . fish oil-omega-3 fatty acids 1000 MG capsule Take 1 g by mouth daily.    . fluticasone (FLONASE) 50 MCG/ACT nasal spray INSTILL 2 SPRAYS INTO EACH NOSTRIL  DAILY..the patient  uses prn  . folic acid (FOLVITE) 1 MG tablet Take 1 mg by mouth daily.    Marland Kitchen glucose blood (ONE TOUCH ULTRA TEST) test strip Use to test blood sugar 1-2 times daily or as directed. Dx 250.00  . Insulin Glargine (LANTUS SOLOSTAR) 100 UNIT/ML SOPN Inject 20 Units into the skin every morning. 16 units in the evening  . LANOXIN 250 MCG tablet TAKE 1/2 TABLET BY MOUTH EVERY DAY  . levothyroxine (SYNTHROID, LEVOTHROID) 100 MCG tablet Take 100 mcg by mouth daily before breakfast.  . metFORMIN (GLUCOPHAGE) 500 MG tablet Take 1,000 mg by mouth 2 (two) times daily with a meal.   . metoprolol succinate (TOPROL-XL) 25 MG 24 hr tablet TAKE 1 TABLET BY MOUTH EVERY DAY  . Milk Thistle 500 MG CAPS Take by mouth daily.    . montelukast (SINGULAIR) 10 MG tablet TAKE 1 TABLET BY MOUTH EVERY NIGHT AT BEDTIME  . Multiple Vitamin (MULTIVITAMIN) capsule Take 1 capsule by mouth daily.    Marland Kitchen omeprazole (PRILOSEC) 20 MG capsule Take 1 capsule (20 mg total) by mouth 2 (two) times daily before a meal.  . TRAVEL SICKNESS 25 MG CHEW TAKE 1 TABLET BY MOUTH THREE TIMES DAILY AS NEEDED  . vitamin B-12 (CYANOCOBALAMIN) 100 MCG tablet  Take 50 mcg by mouth daily.    . vitamin C (ASCORBIC ACID) 500 MG tablet Take 500 mg by mouth daily.    . Vitamins C E (CRANBERRY CONCENTRATE) 100-3 MG-UNIT CAPS Take 2 capsules by mouth daily.    . beclomethasone (QVAR) 80 MCG/ACT inhaler Inhale 2 puffs into the lungs 2 (two) times daily.   No current facility-administered medications on file prior to visit.    Review of Systems Per HPI unless specifically indicated above    Objective:    BP 160/70  Pulse 54  Temp(Src) 97.9 F (36.6 C) (Oral)  Wt 155 lb 8 oz (70.534 kg)  Physical Exam  Nursing note and vitals reviewed. Constitutional: She appears well-developed and well-nourished. No distress.  Skin: Skin is warm and dry. Rash noted. There is erythema.  Irritated shallow ulcer with mild surrounding induration and  erythema. Some healthy granulation tissue present   Results for orders placed in visit on 11/19/13  HEMOGLOBIN A1C      Result Value Ref Range   Hemoglobin A1C 6.9 (*) 4.0 - 6.0 %  LIPID PANEL      Result Value Ref Range   Cholesterol 105  0 - 200 mg/dL   Triglycerides 914.7  0.0 - 149.0 mg/dL   HDL 82.95  >62.13 mg/dL   VLDL 08.6  0.0 - 57.8 mg/dL   LDL Cholesterol 32  0 - 99 mg/dL   Total CHOL/HDL Ratio 2    T4, FREE      Result Value Ref Range   Free T4 1.19  0.60 - 1.60 ng/dL  BASIC METABOLIC PANEL      Result Value Ref Range   Sodium 138  135 - 145 mEq/L   Potassium 4.3  3.5 - 5.1 mEq/L   Chloride 100  96 - 112 mEq/L   CO2 29  19 - 32 mEq/L   Glucose, Bld 127 (*) 70 - 99 mg/dL   BUN 18  6 - 23 mg/dL   Creatinine, Ser 0.8  0.4 - 1.2 mg/dL   Calcium 9.4  8.4 - 46.9 mg/dL   GFR 62.95  >28.41 mL/min  CBC WITH DIFFERENTIAL      Result Value Ref Range   WBC 9.2  4.5 - 10.5 K/uL   RBC 4.58  3.87 - 5.11 Mil/uL   Hemoglobin 13.7  12.0 - 15.0 g/dL   HCT 32.4  40.1 - 02.7 %   MCV 88.3  78.0 - 100.0 fl   MCHC 33.8  30.0 - 36.0 g/dL   RDW 25.3  66.4 - 40.3 %   Platelets 245.0  150.0 - 400.0 K/uL   Neutrophils Relative % 54.8  43.0 - 77.0 %   Lymphocytes Relative 28.8  12.0 - 46.0 %   Monocytes Relative 9.3  3.0 - 12.0 %   Eosinophils Relative 6.8 (*) 0.0 - 5.0 %   Basophils Relative 0.3  0.0 - 3.0 %   Neutro Abs 5.0  1.4 - 7.7 K/uL   Lymphs Abs 2.6  0.7 - 4.0 K/uL   Monocytes Absolute 0.9  0.1 - 1.0 K/uL   Eosinophils Absolute 0.6  0.0 - 0.7 K/uL   Basophils Absolute 0.0  0.0 - 0.1 K/uL  TSH      Result Value Ref Range   TSH 0.37  0.35 - 5.50 uIU/mL  HEPATIC FUNCTION PANEL      Result Value Ref Range   Total Bilirubin 0.4  0.3 - 1.2 mg/dL   Bilirubin, Direct  0.0  0.0 - 0.3 mg/dL   Alkaline Phosphatase 67  39 - 117 U/L   AST 25  0 - 37 U/L   ALT 24  0 - 35 U/L   Total Protein 7.1  6.0 - 8.3 g/dL   Albumin 4.3  3.5 - 5.2 g/dL      Assessment & Plan:   Problem  List Items Addressed This Visit   Wound of skin - Primary     Anticipate bug bite irritated after scratching. rec stop cortisone, may use vaseline with bandaid change daily. Red flags to notify us discussed. If streaking redness or not improving would start mupirocin cream Pt agrees with plan.        Follow up plan: Return if symptoms worsen or fail to improve.

## 2014-05-30 NOTE — Patient Instructions (Signed)
Treat with daily vaseline onto wound and then cover with bandaid - change bandaid once daily. Watch for spreading or streaking redness or not improving as expected - if this happens let us know for second antibiotic ointment. Good to see you today, call us with questions.

## 2014-05-30 NOTE — Assessment & Plan Note (Signed)
Anticipate bug bite irritated after scratching. rec stop cortisone, may use vaseline with bandaid change daily. Red flags to notify us discussed. If streaking redness or not improving would start mupirocin cream Pt agrees with plan.

## 2014-06-04 ENCOUNTER — Telehealth: Payer: Self-pay | Admitting: Pulmonary Disease

## 2014-06-04 DIAGNOSIS — J4520 Mild intermittent asthma, uncomplicated: Secondary | ICD-10-CM

## 2014-06-04 DIAGNOSIS — R9389 Abnormal findings on diagnostic imaging of other specified body structures: Secondary | ICD-10-CM

## 2014-06-04 NOTE — Telephone Encounter (Signed)
Called and spoke to pt. Informed pt of results and recs per BQ. Pt verbalized understanding and denied any further questions or concerns at this time. CT order placed. Pt aware someone will contact pt to schedule CT.

## 2014-06-04 NOTE — Telephone Encounter (Signed)
Pt's husband has called again.

## 2014-06-04 NOTE — Progress Notes (Signed)
Quick Note:  Called and spoke to pt. Informed pt of results and recs per BQ. Pt verbalized understanding and denied any further questions or concerns at this time. CT order placed. Pt aware someone will contact pt to schedule CT. ______

## 2014-06-04 NOTE — Progress Notes (Signed)
Quick Note:  LMTCB X1 to relay results. Will not schedule the CT chest until the pt is aware of results and recs. ______

## 2014-06-10 ENCOUNTER — Ambulatory Visit (INDEPENDENT_AMBULATORY_CARE_PROVIDER_SITE_OTHER)
Admission: RE | Admit: 2014-06-10 | Discharge: 2014-06-10 | Disposition: A | Discharge status: Home / Self Care | Payer: Medicare PPO | Source: Ambulatory Visit | Attending: Pulmonary Disease | Admitting: Pulmonary Disease

## 2014-06-10 DIAGNOSIS — R9389 Abnormal findings on diagnostic imaging of other specified body structures: Secondary | ICD-10-CM

## 2014-06-10 DIAGNOSIS — J45909 Unspecified asthma, uncomplicated: Secondary | ICD-10-CM

## 2014-06-10 DIAGNOSIS — J4520 Mild intermittent asthma, uncomplicated: Secondary | ICD-10-CM

## 2014-06-11 ENCOUNTER — Other Ambulatory Visit: Payer: Medicare PPO

## 2014-06-12 ENCOUNTER — Telehealth: Payer: Self-pay | Admitting: Pulmonary Disease

## 2014-06-12 ENCOUNTER — Encounter: Payer: Self-pay | Admitting: Pulmonary Disease

## 2014-06-12 NOTE — Progress Notes (Signed)
Quick Note:  lmtcb X1 to relay results. ______ 

## 2014-06-12 NOTE — Telephone Encounter (Signed)
Notes Recorded by Lupita Leashouglas B McQuaid, MD on 06/12/2014 at 11:00 AM A, Please let her know that her CT chest looked OK. Nothing to worry about. Thanks ---  lmtcb x1

## 2014-06-12 NOTE — Telephone Encounter (Signed)
I spoke with patient about results and she verbalized understanding and had no questions 

## 2014-06-17 ENCOUNTER — Other Ambulatory Visit: Payer: Self-pay | Admitting: Internal Medicine

## 2014-06-18 ENCOUNTER — Ambulatory Visit (INDEPENDENT_AMBULATORY_CARE_PROVIDER_SITE_OTHER): Payer: Medicare PPO | Admitting: Internal Medicine

## 2014-06-18 ENCOUNTER — Encounter: Payer: Self-pay | Admitting: Internal Medicine

## 2014-06-18 VITALS — BP 140/80 | HR 73 | Temp 97.8°F | Wt 157.0 lb

## 2014-06-18 DIAGNOSIS — L02219 Cutaneous abscess of trunk, unspecified: Secondary | ICD-10-CM

## 2014-06-18 DIAGNOSIS — L03319 Cellulitis of trunk, unspecified: Secondary | ICD-10-CM

## 2014-06-18 DIAGNOSIS — L03313 Cellulitis of chest wall: Secondary | ICD-10-CM

## 2014-06-18 MED ORDER — MUPIROCIN CALCIUM 2 % EX CREA: 1.0000 "application " | TOPICAL_CREAM | Freq: Three times a day (TID) | CUTANEOUS | Status: DC

## 2014-06-18 MED ORDER — DOXYCYCLINE HYCLATE 100 MG PO TABS: 100.0000 mg | ORAL_TABLET | Freq: Two times a day (BID) | ORAL | Status: DC

## 2014-06-18 NOTE — Progress Notes (Signed)
Pre visit review using our clinic review tool, if applicable. No additional management support is needed unless otherwise documented below in the visit note. 

## 2014-06-18 NOTE — Progress Notes (Signed)
Subjective:    Patient ID: [name], female    DOB: October 15, 1931, 80 y.o.   MRN: [id number]  HPI Here with husband  Has the same skin lesion on upper right chest Just doesn't clear up Did think it might have been a bite initially Seen 2 weeks ago  No better with vaseline and bandaid May be some worse Some discharge on the bandaid  No other similar spots  Current Outpatient Prescriptions on File Prior to Visit  Medication Sig Dispense Refill  . albuterol (PROVENTIL HFA;VENTOLIN HFA) 108 (90 BASE) MCG/ACT inhaler Inhale 2 puffs into the lungs every 6 (six) hours as needed.  18 g  1  . ALPRAZolam (XANAX) 0.25 MG tablet Take 1 tablet (0.25 mg total) by mouth 2 (two) times daily as needed.  100 tablet  0  . aspirin 81 MG tablet Take 81 mg by mouth daily.        Marland Kitchen atorvastatin (LIPITOR) 20 MG tablet TAKE 1 TABLET BY MOUTH EVERY DAY  90 tablet  3  . B-D UF III MINI PEN NEEDLES 31G X 5 MM MISC USE WITH INSULIN SYRINGES EVERY DAY AS DIRECTED  100 each  0  . B-D ULTRA-FINE 33 LANCETS MISC TEST BLOOD SUGAR TWICE DAILY OR AS DIRECTED  300 each  1  . beclomethasone (QVAR) 80 MCG/ACT inhaler Inhale 2 puffs into the lungs 2 (two) times daily.  3 Inhaler  1  . calcitonin, salmon, (MIACALCIN/FORTICAL) 200 UNIT/ACT nasal spray USE 1 SPRAY IN THE NOSE EVERY DAY  3.7 mL  0  . CALCIUM-VITAMIN D PO Take 1 tablet by mouth daily.        . Chromium 100 MCG TABS Take by mouth daily.        . Coenzyme Q10 200 MG capsule Take 200 mg by mouth daily.        Marland Kitchen EPINEPHrine (EPI-PEN) 0.3 mg/0.3 mL DEVI Inject 0.3 mLs (0.3 mg total) into the muscle once.  2 Device  1  . fish oil-omega-3 fatty acids 1000 MG capsule Take 1 g by mouth daily.        . fluticasone (FLONASE) 50 MCG/ACT nasal spray INSTILL 2 SPRAYS INTO EACH NOSTRIL DAILY..the patient  uses prn      . folic acid (FOLVITE) 1 MG tablet Take 1 mg by mouth daily.        Marland Kitchen glucose blood (ONE TOUCH ULTRA TEST) test strip Use to test blood sugar 1-2 times  daily or as directed. Dx 250.00  200 each  3  . Insulin Glargine (LANTUS SOLOSTAR) 100 UNIT/ML SOPN Inject 20 Units into the skin every morning. 16 units in the evening      . LANOXIN 250 MCG tablet TAKE 1/2 TABLET BY MOUTH EVERY DAY  60 tablet  0  . levothyroxine (SYNTHROID, LEVOTHROID) 100 MCG tablet Take 100 mcg by mouth daily before breakfast.      . metFORMIN (GLUCOPHAGE) 500 MG tablet Take 1,000 mg by mouth 2 (two) times daily with a meal.       . metoprolol succinate (TOPROL-XL) 25 MG 24 hr tablet TAKE 1 TABLET BY MOUTH EVERY DAY  90 tablet  3  . Milk Thistle 500 MG CAPS Take by mouth daily.        . montelukast (SINGULAIR) 10 MG tablet TAKE 1 TABLET BY MOUTH EVERY NIGHT AT BEDTIME  90 tablet  0  . Multiple Vitamin (MULTIVITAMIN) capsule Take 1 capsule by mouth daily.        Marland Kitchen  omeprazole (PRILOSEC) 20 MG capsule Take 1 capsule (20 mg total) by mouth 2 (two) times daily before a meal.  180 capsule  3  . TRAVEL SICKNESS 25 MG CHEW TAKE 1 TABLET BY MOUTH THREE TIMES DAILY AS NEEDED  90 tablet  0  . vitamin B-12 (CYANOCOBALAMIN) 100 MCG tablet Take 50 mcg by mouth daily.        . vitamin C (ASCORBIC ACID) 500 MG tablet Take 500 mg by mouth daily.        . Vitamins C E (CRANBERRY CONCENTRATE) 100-3 MG-UNIT CAPS Take 2 capsules by mouth daily.         No current facility-administered medications on file prior to visit.    Allergies  Allergen Reactions  . Cefdinir     REACTION: unspecified  . Ciprofloxacin     rash  . Clarithromycin     REACTION: weird dreams (on prednisone at the same time but has tolerated this in the past)  . Miglitol     REACTION: unspecified  . Niacin     REACTION: unspecified    Past Medical History  Diagnosis Date  . Allergy   . Asthma   . Diabetes mellitus   . GERD (gastroesophageal reflux disease)   . Hyperlipidemia   . Thyroid disease   . Atrial fibrillation   . Osteopenia   . Sleep disorder     Past Surgical History  Procedure Laterality Date    . Cholecystectomy    . Strabismus surgery  1946 / 1967  . Vaginal delivery      x2  . Cervical cone biopsy  1960's  . Nasal polyp surgery  01/01    Family History  Problem Relation Age of Onset  . Asthma Mother   . Coronary artery disease Father     History   Social History  . Marital Status: Married    Spouse Name: N/A    Number of Children: 2  . Years of Education: N/A   Occupational History  .     Social History Main Topics  . Smoking status: Never Smoker   . Smokeless tobacco: Never Used  . Alcohol Use: Yes     Comment: 1 glass of wine per day  . Drug Use: No  . Sexual Activity: Not on file   Other Topics Concern  . Not on file   Social History Narrative   Has living will   Not sure about formal POA --but requests husband, then daughter [name] (and son Francis DowseJoel) to be health care POAs   Would accept resuscitation attempts   Not sure about feeding tubes   Review of Systems No fever Cheeks looked bright red a few days ago---went away after an hour     Objective:   Physical Exam  Constitutional: She appears well-developed and well-nourished. No distress.  Skin:  Inflamed papule on upper right chest Slight tenderness and warmth Surrounding red areas further laterally          Assessment & Plan:

## 2014-06-18 NOTE — Assessment & Plan Note (Signed)
Suspicious for MRSA Will treat with doxy and mupirocin Change to septra if can't tolerate the doxy

## 2014-06-20 ENCOUNTER — Ambulatory Visit: Payer: Medicare PPO | Admitting: Internal Medicine

## 2014-06-21 ENCOUNTER — Other Ambulatory Visit: Payer: Self-pay | Admitting: Internal Medicine

## 2014-06-30 ENCOUNTER — Other Ambulatory Visit: Payer: Self-pay | Admitting: Internal Medicine

## 2014-07-12 ENCOUNTER — Other Ambulatory Visit: Payer: Self-pay | Admitting: Internal Medicine

## 2014-07-14 ENCOUNTER — Encounter: Payer: Self-pay | Admitting: Internal Medicine

## 2014-07-14 ENCOUNTER — Encounter: Payer: Self-pay | Admitting: *Deleted

## 2014-07-14 ENCOUNTER — Ambulatory Visit (INDEPENDENT_AMBULATORY_CARE_PROVIDER_SITE_OTHER): Payer: Medicare PPO | Admitting: Internal Medicine

## 2014-07-14 VITALS — BP 130/78 | HR 78 | Temp 98.2°F | Wt 156.5 lb

## 2014-07-14 DIAGNOSIS — L570 Actinic keratosis: Secondary | ICD-10-CM | POA: Diagnosis not present

## 2014-07-14 DIAGNOSIS — Z23 Encounter for immunization: Secondary | ICD-10-CM | POA: Diagnosis not present

## 2014-07-14 NOTE — Assessment & Plan Note (Signed)
On chest wall Liquid nitrogen 45 seconds x 2 Tolerated well Discussed home care Derm if persists

## 2014-07-14 NOTE — Progress Notes (Signed)
Subjective:    Patient ID: [name], female    DOB: November 23, 1930, 80 y.o.   MRN: [id number]  HPI In with husband Lesion on chest has not healed Still crusting and inflamed Some discomfort  Current Outpatient Prescriptions on File Prior to Visit  Medication Sig Dispense Refill  . albuterol (PROVENTIL HFA;VENTOLIN HFA) 108 (90 BASE) MCG/ACT inhaler Inhale 2 puffs into the lungs every 6 (six) hours as needed.  18 g  1  . ALPRAZolam (XANAX) 0.25 MG tablet Take 1 tablet (0.25 mg total) by mouth 2 (two) times daily as needed.  100 tablet  0  . aspirin 81 MG tablet Take 81 mg by mouth daily.        Marland Kitchen atorvastatin (LIPITOR) 20 MG tablet TAKE 1 TABLET BY MOUTH EVERY DAY  90 tablet  3  . B-D UF III MINI PEN NEEDLES 31G X 5 MM MISC USE EVERY DAY AS DIRECTED  100 each  0  . B-D ULTRA-FINE 33 LANCETS MISC TEST BLOOD SUGAR TWICE DAILY OR AS DIRECTED  300 each  1  . beclomethasone (QVAR) 80 MCG/ACT inhaler Inhale 2 puffs into the lungs 2 (two) times daily.  3 Inhaler  1  . calcitonin, salmon, (MIACALCIN/FORTICAL) 200 UNIT/ACT nasal spray USE 1 SPRAY IN THE NOSE EVERY DAY  3.7 mL  0  . CALCIUM-VITAMIN D PO Take 1 tablet by mouth daily.        . Chromium 100 MCG TABS Take by mouth daily.        . Coenzyme Q10 200 MG capsule Take 200 mg by mouth daily.        Marland Kitchen doxycycline (VIBRA-TABS) 100 MG tablet Take 1 tablet (100 mg total) by mouth 2 (two) times daily.  20 tablet  0  . EPINEPHrine (EPI-PEN) 0.3 mg/0.3 mL DEVI Inject 0.3 mLs (0.3 mg total) into the muscle once.  2 Device  1  . fish oil-omega-3 fatty acids 1000 MG capsule Take 1 g by mouth daily.        . fluticasone (FLONASE) 50 MCG/ACT nasal spray INSTILL 2 SPRAYS INTO EACH NOSTRIL DAILY..the patient  uses prn      . folic acid (FOLVITE) 1 MG tablet Take 1 mg by mouth daily.        Marland Kitchen glucose blood (ONE TOUCH ULTRA TEST) test strip Use to test blood sugar once daily dx: 250.00  100 each  3  . Insulin Glargine (LANTUS SOLOSTAR) 100 UNIT/ML SOPN  Inject 20 Units into the skin every morning. 16 units in the evening      . LANOXIN 250 MCG tablet TAKE 1/2 TABLET BY MOUTH EVERY DAY  60 tablet  0  . levothyroxine (SYNTHROID, LEVOTHROID) 100 MCG tablet Take 100 mcg by mouth daily before breakfast.      . metFORMIN (GLUCOPHAGE) 500 MG tablet Take 1,000 mg by mouth 2 (two) times daily with a meal.       . metoprolol succinate (TOPROL-XL) 25 MG 24 hr tablet TAKE 1 TABLET BY MOUTH EVERY DAY  90 tablet  3  . Milk Thistle 500 MG CAPS Take by mouth daily.        . montelukast (SINGULAIR) 10 MG tablet TAKE 1 TABLET BY MOUTH EVERY NIGHT AT BEDTIME  90 tablet  0  . Multiple Vitamin (MULTIVITAMIN) capsule Take 1 capsule by mouth daily.        . mupirocin cream (BACTROBAN) 2 % Apply 1 application topically 3 (three) times daily.  15 g  0  . omeprazole (PRILOSEC) 20 MG capsule Take 1 capsule (20 mg total) by mouth 2 (two) times daily before a meal.  180 capsule  3  . TRAVEL SICKNESS 25 MG CHEW TAKE 1 TABLET BY MOUTH THREE TIMES DAILY AS NEEDED  90 tablet  0  . vitamin B-12 (CYANOCOBALAMIN) 100 MCG tablet Take 50 mcg by mouth daily.        . vitamin C (ASCORBIC ACID) 500 MG tablet Take 500 mg by mouth daily.        . Vitamins C E (CRANBERRY CONCENTRATE) 100-3 MG-UNIT CAPS Take 2 capsules by mouth daily.         No current facility-administered medications on file prior to visit.    Allergies  Allergen Reactions  . Cefdinir     REACTION: unspecified  . Ciprofloxacin     rash  . Clarithromycin     REACTION: weird dreams (on prednisone at the same time but has tolerated this in the past)  . Miglitol     REACTION: unspecified  . Niacin     REACTION: unspecified    Past Medical History  Diagnosis Date  . Allergy   . Asthma   . Diabetes mellitus   . GERD (gastroesophageal reflux disease)   . Hyperlipidemia   . Thyroid disease   . Atrial fibrillation   . Osteopenia   . Sleep disorder     Past Surgical History  Procedure Laterality Date  .  Cholecystectomy    . Strabismus surgery  1946 / 1967  . Vaginal delivery      x2  . Cervical cone biopsy  1960's  . Nasal polyp surgery  01/01    Family History  Problem Relation Age of Onset  . Asthma Mother   . Coronary artery disease Father     History   Social History  . Marital Status: Married    Spouse Name: N/A    Number of Children: 2  . Years of Education: N/A   Occupational History  .     Social History Main Topics  . Smoking status: Never Smoker   . Smokeless tobacco: Never Used  . Alcohol Use: Yes     Comment: 1 glass of wine per day  . Drug Use: No  . Sexual Activity: Not on file   Other Topics Concern  . Not on file   Social History Narrative   Has living will   Not sure about formal POA --but requests husband, then daughter [name] (and son [name]) to be health care POAs   Would accept resuscitation attempts   Not sure about feeding tubes   Review of Systems     Objective:   Physical Exam  Skin:  4mm inflamed crusted spot on right upper chest          Assessment & Plan:

## 2014-07-16 ENCOUNTER — Other Ambulatory Visit: Payer: Self-pay | Admitting: Internal Medicine

## 2014-07-25 ENCOUNTER — Encounter: Payer: Self-pay | Admitting: Internal Medicine

## 2014-08-01 ENCOUNTER — Encounter: Payer: Self-pay | Admitting: Internal Medicine

## 2014-08-07 ENCOUNTER — Other Ambulatory Visit: Payer: Self-pay | Admitting: Internal Medicine

## 2014-08-07 NOTE — Telephone Encounter (Signed)
01/16/14 

## 2014-08-08 NOTE — Telephone Encounter (Signed)
Okay #100 x 0 

## 2014-08-08 NOTE — Telephone Encounter (Signed)
rx called into pharmacy

## 2014-08-13 ENCOUNTER — Other Ambulatory Visit: Payer: Self-pay | Admitting: Internal Medicine

## 2014-08-13 NOTE — Telephone Encounter (Signed)
Last office visit 07/14/2014.  Last refilled 05/14/2014 for #90 with no refills. Ok to refill?

## 2014-08-14 NOTE — Telephone Encounter (Signed)
Okay #90 x 3 

## 2014-08-15 ENCOUNTER — Telehealth: Payer: Self-pay

## 2014-08-15 NOTE — Telephone Encounter (Signed)
Pt left v/m requesting pen needles instructions changed to twice a day. [facility] [address] Unable to reach pt by phone and spoke with tara at [facility] and she spoke with pt earlier and pt told her she uses 2 pen needles daily.Please advise. Did not change instructions until approved by Dr Alphonsus SiasLetvak.

## 2014-08-15 NOTE — Telephone Encounter (Signed)
She sees Dr Renae FicklePaul at [facility] for her diabetes so need to confirm her insulin dosing with her

## 2014-08-19 NOTE — Telephone Encounter (Signed)
Left message on answering machine to call back.

## 2014-08-20 NOTE — Telephone Encounter (Signed)
Pt left v/m returning call and requesting cb. 

## 2014-08-20 NOTE — Telephone Encounter (Signed)
Pt returned your call You can call her back @ [phone]

## 2014-08-20 NOTE — Telephone Encounter (Signed)
Spoke with pt and notified as instructed;pt will contact Dr Ollen GrossPaul's office.

## 2014-09-05 ENCOUNTER — Other Ambulatory Visit: Payer: Self-pay | Admitting: Internal Medicine

## 2014-09-08 ENCOUNTER — Encounter: Payer: Self-pay | Admitting: Internal Medicine

## 2014-09-08 ENCOUNTER — Ambulatory Visit (INDEPENDENT_AMBULATORY_CARE_PROVIDER_SITE_OTHER): Payer: Medicare PPO | Admitting: Internal Medicine

## 2014-09-08 VITALS — BP 138/60 | HR 107 | Temp 98.1°F | Wt 156.0 lb

## 2014-09-08 DIAGNOSIS — J209 Acute bronchitis, unspecified: Secondary | ICD-10-CM

## 2014-09-08 DIAGNOSIS — J45901 Unspecified asthma with (acute) exacerbation: Secondary | ICD-10-CM

## 2014-09-08 DIAGNOSIS — J4521 Mild intermittent asthma with (acute) exacerbation: Secondary | ICD-10-CM | POA: Insufficient documentation

## 2014-09-08 MED ORDER — PREDNISONE 20 MG PO TABS: 40.0000 mg | ORAL_TABLET | Freq: Every day | ORAL | Status: DC

## 2014-09-08 NOTE — Progress Notes (Signed)
Subjective:    Patient ID: [name], female    DOB: [birth year], 80 y.o.   MRN: [id number]  HPI Coughing a lot Stressed with visiting husband in hospital and rehab for 25 days He had respiratory illness that he apparently brought home to her Started with very sore throat about 5 days ago Down into her chest and then up into her head--pain in head-even hair Cough is still bad at night  No fever--though felt hot at times No chills but some sweats No SOB Slight wheezing  Not regular with qvar Albuterol has been helping her Productive cough--but very little and just swallowing Had hoarse voice for a while also--couldn't talk at all for a while  Using cough syrup Hot toddies---slight help  Current Outpatient Prescriptions on File Prior to Visit  Medication Sig Dispense Refill  . albuterol (PROVENTIL HFA;VENTOLIN HFA) 108 (90 BASE) MCG/ACT inhaler Inhale 2 puffs into the lungs every 6 (six) hours as needed.  18 g  1  . ALPRAZolam (XANAX) 0.25 MG tablet TAKE 1 TABLET BY MOUTH TWICE DAILY  100 tablet  0  . aspirin 81 MG tablet Take 81 mg by mouth daily.        Marland Kitchen. atorvastatin (LIPITOR) 20 MG tablet TAKE 1 TABLET BY MOUTH EVERY DAY  90 tablet  3  . B-D UF III MINI PEN NEEDLES 31G X 5 MM MISC USE EVERY DAY AS DIRECTED  100 each  0  . B-D ULTRA-FINE 33 LANCETS MISC TEST BLOOD SUGAR TWICE DAILY OR AS DIRECTED  300 each  1  . beclomethasone (QVAR) 80 MCG/ACT inhaler Inhale 2 puffs into the lungs 2 (two) times daily.  3 Inhaler  1  . calcitonin, salmon, (MIACALCIN/FORTICAL) 200 UNIT/ACT nasal spray USE 1 SPRAY IN THE NOSE EVERY DAY  3.7 mL  0  . CALCIUM-VITAMIN D PO Take 1 tablet by mouth daily.        . Chromium 100 MCG TABS Take by mouth daily.        . Coenzyme Q10 200 MG capsule Take 200 mg by mouth daily.        Marland Kitchen. EPINEPHrine (EPI-PEN) 0.3 mg/0.3 mL DEVI Inject 0.3 mLs (0.3 mg total) into the muscle once.  2 Device  1  . fish oil-omega-3 fatty acids 1000 MG capsule Take 1 g by  mouth daily.        . fluticasone (FLONASE) 50 MCG/ACT nasal spray INSTILL 2 SPRAYS INTO EACH NOSTRIL DAILY..the patient  uses prn      . folic acid (FOLVITE) 1 MG tablet Take 1 mg by mouth daily.        Marland Kitchen. glucose blood (ONE TOUCH ULTRA TEST) test strip Use to test blood sugar once daily dx: 250.00  100 each  3  . LANOXIN 250 MCG tablet TAKE 1/2 TABLET BY MOUTH EVERY DAY  60 tablet  0  . LANTUS SOLOSTAR 100 UNIT/ML Solostar Pen INJECT 66 UNITS UNDER THE SKIN EVERY DAY  30 mL  0  . levothyroxine (SYNTHROID, LEVOTHROID) 100 MCG tablet Take 100 mcg by mouth daily before breakfast.      . metFORMIN (GLUCOPHAGE) 500 MG tablet Take 1,000 mg by mouth 2 (two) times daily with a meal.       . metoprolol succinate (TOPROL-XL) 25 MG 24 hr tablet TAKE 1 TABLET BY MOUTH EVERY DAY  90 tablet  3  . Milk Thistle 500 MG CAPS Take by mouth daily.        .Marland Kitchen  montelukast (SINGULAIR) 10 MG tablet TAKE 1 TABLET BY MOUTH EVERY NIGHT AT BEDTIME  90 tablet  0  . Multiple Vitamin (MULTIVITAMIN) capsule Take 1 capsule by mouth daily.        Marland Kitchen. omeprazole (PRILOSEC) 20 MG capsule Take 1 capsule (20 mg total) by mouth 2 (two) times daily before a meal.  180 capsule  3  . TRAVEL SICKNESS 25 MG CHEW TAKE 1 TABLET BY MOUTH THREE TIMES DAILY AS NEEDED  90 tablet  3  . vitamin B-12 (CYANOCOBALAMIN) 100 MCG tablet Take 50 mcg by mouth daily.        . vitamin C (ASCORBIC ACID) 500 MG tablet Take 500 mg by mouth daily.        . Vitamins C E (CRANBERRY CONCENTRATE) 100-3 MG-UNIT CAPS Take 2 capsules by mouth daily.         No current facility-administered medications on file prior to visit.    Allergies  Allergen Reactions  . Cefdinir     REACTION: unspecified  . Ciprofloxacin     rash  . Clarithromycin     REACTION: weird dreams (on prednisone at the same time but has tolerated this in the past)  . Miglitol     REACTION: unspecified  . Niacin     REACTION: unspecified    Past Medical History  Diagnosis Date  . Allergy    . Asthma   . Diabetes mellitus   . GERD (gastroesophageal reflux disease)   . Hyperlipidemia   . Thyroid disease   . Atrial fibrillation   . Osteopenia   . Sleep disorder     Past Surgical History  Procedure Laterality Date  . Cholecystectomy    . Strabismus surgery  1946 / 1967  . Vaginal delivery      x2  . Cervical cone biopsy  1960's  . Nasal polyp surgery  01/01    Family History  Problem Relation Age of Onset  . Asthma Mother   . Coronary artery disease Father     History   Social History  . Marital Status: Married    Spouse Name: N/A    Number of Children: 2  . Years of Education: N/A   Occupational History  .     Social History Main Topics  . Smoking status: Never Smoker   . Smokeless tobacco: Never Used  . Alcohol Use: Yes     Comment: 1 glass of wine per day  . Drug Use: No  . Sexual Activity: Not on file   Other Topics Concern  . Not on file   Social History Narrative   Has living will   Not sure about formal POA --but requests husband, then daughter [name] (and son [name]) to be health care POAs   Would accept resuscitation attempts   Not sure about feeding tubes   Review of Systems Some rash around neck line--has been wearing cotton socks around her neck for comfort Some loose stools of the metformin---nothing different Appetite is okay       Objective:   Physical Exam  Constitutional: She appears well-developed and well-nourished. No distress.  Lots of coughing paroxysms  HENT:  No sinus tenderness Mild nasal congestion--mostly on left Mild pharyngeal injection without exudate TMs normal  Neck: Normal range of motion. Neck supple. No thyromegaly present.  Pulmonary/Chest: No respiratory distress. She has no rales.  Mild increase exp phase and exp wheezing  Lymphadenopathy:    She has no cervical adenopathy.  Assessment & Plan:

## 2014-09-08 NOTE — Assessment & Plan Note (Signed)
Mild Didn't start the qvar--discussed that she needs to start this with any respiratory illness Will give short time of prednisone

## 2014-09-08 NOTE — Assessment & Plan Note (Signed)
Seems probably viral (especially with prominent laryngitis) No antibiotics unless she worsens

## 2014-09-08 NOTE — Progress Notes (Signed)
Pre visit review using our clinic review tool, if applicable. No additional management support is needed unless otherwise documented below in the visit note. 

## 2014-09-08 NOTE — Patient Instructions (Signed)
Please take the qvar as soon as you have any respiratory symptoms. Start it now and use it twice a day-- then you can slowly decrease it when you are better. Let me know if your are worsening in the next couple of days--then I would try an antibiotic

## 2014-09-10 ENCOUNTER — Telehealth: Payer: Self-pay | Admitting: Internal Medicine

## 2014-09-10 MED ORDER — AMOXICILLIN-POT CLAVULANATE 875-125 MG PO TABS
1.0000 | ORAL_TABLET | Freq: Two times a day (BID) | ORAL | Status: DC
Start: 2014-09-10 — End: 2014-11-11

## 2014-09-10 NOTE — Telephone Encounter (Signed)
Pt spouse called and stated pt is not feeling any better. Pt has cough and elevated temp. Prednisone is not helping per pt husband, [name].   [facility] [address]

## 2014-09-10 NOTE — Telephone Encounter (Signed)
Please let him know I sent the Rx for an antibiotic for her

## 2014-09-10 NOTE — Telephone Encounter (Signed)
Spoke with patient's husband and advised results.  

## 2014-09-11 NOTE — Telephone Encounter (Signed)
Mr [name] left [name]/vm; pt is taking antibiotic and Mr [name] thinks pt does need refill of prednisone for 5 more days to [facility] [address]. Please advise.

## 2014-09-12 MED ORDER — PREDNISONE 20 MG PO TABS: 40.0000 mg | ORAL_TABLET | Freq: Every day | ORAL | Status: DC

## 2014-09-12 NOTE — Telephone Encounter (Signed)
Okay to refill prednsione as requested .

## 2014-09-12 NOTE — Telephone Encounter (Signed)
Mr [name] left v/m requesting cb about getting prednisone; pt usually does better if takes antibiotic and prednisone. Mr [name] request cb.

## 2014-09-12 NOTE — Addendum Note (Signed)
Addended by: Damita LackLORING, Aarian Griffie S on: 09/12/2014 02:45 PM   Modules accepted: Orders

## 2014-09-12 NOTE — Telephone Encounter (Signed)
Mr. Hannah LyonsCasey notified prednisone prescription for his wife has been sent to their pharmacy/

## 2014-09-19 ENCOUNTER — Other Ambulatory Visit: Payer: Self-pay | Admitting: *Deleted

## 2014-09-19 MED ORDER — ATORVASTATIN CALCIUM 20 MG PO TABS: ORAL_TABLET | ORAL | Status: DC

## 2014-09-23 ENCOUNTER — Other Ambulatory Visit: Payer: Self-pay | Admitting: Internal Medicine

## 2014-10-02 ENCOUNTER — Other Ambulatory Visit: Payer: Self-pay | Admitting: Internal Medicine

## 2014-10-18 ENCOUNTER — Other Ambulatory Visit: Payer: Self-pay | Admitting: Internal Medicine

## 2014-11-11 ENCOUNTER — Encounter: Payer: Self-pay | Admitting: Internal Medicine

## 2014-11-11 ENCOUNTER — Ambulatory Visit (INDEPENDENT_AMBULATORY_CARE_PROVIDER_SITE_OTHER): Payer: Medicare PPO | Admitting: Internal Medicine

## 2014-11-11 VITALS — BP 150/80 | HR 99 | Temp 99.0°F | Wt 156.0 lb

## 2014-11-11 DIAGNOSIS — J0100 Acute maxillary sinusitis, unspecified: Secondary | ICD-10-CM | POA: Insufficient documentation

## 2014-11-11 MED ORDER — AMOXICILLIN 500 MG PO TABS: 1000.0000 mg | ORAL_TABLET | Freq: Two times a day (BID) | ORAL | Status: DC

## 2014-11-11 NOTE — Progress Notes (Signed)
Subjective:    Patient ID: Hannah BelfastIrmgard P [name], female    DOB: 1931-09-02, 80 y.o.   MRN: [id number]  HPI Here with husband for respiratory problems  Thinks she may have "the flu" Family all sick--children, etc Started with symptoms 3 days ago Bronchial and in head Sneezing, rhinorrhea, headache Slight cough Having trouble sleeping--achy. Tried advil and gargling with salt water  No SOB Has felt a little warm but no clear fever Some chills last night No energy--wants to stay in bed Some ear pain also---but the pain moves----like to teeth, neck, etc  Current Outpatient Prescriptions on File Prior to Visit  Medication Sig Dispense Refill  . albuterol (PROVENTIL HFA;VENTOLIN HFA) 108 (90 BASE) MCG/ACT inhaler Inhale 2 puffs into the lungs every 6 (six) hours as needed. 18 g 1  . ALPRAZolam (XANAX) 0.25 MG tablet TAKE 1 TABLET BY MOUTH TWICE DAILY 100 tablet 0  . aspirin 81 MG tablet Take 81 mg by mouth daily.      Marland Kitchen. atorvastatin (LIPITOR) 20 MG tablet TAKE 1 TABLET BY MOUTH EVERY DAY 90 tablet 1  . B-D UF III MINI PEN NEEDLES 31G X 5 MM MISC USE EVERY DAY AS DIRECTED 100 each 0  . B-D ULTRA-FINE 33 LANCETS MISC TEST BLOOD SUGAR TWICE DAILY OR AS DIRECTED 300 each 1  . beclomethasone (QVAR) 80 MCG/ACT inhaler Inhale 2 puffs into the lungs 2 (two) times daily. 3 Inhaler 1  . calcitonin, salmon, (MIACALCIN/FORTICAL) 200 UNIT/ACT nasal spray USE 1 SPRAY IN THE NOSE EVERY DAY 3.7 mL 0  . CALCIUM-VITAMIN D PO Take 1 tablet by mouth daily.      . Chromium 100 MCG TABS Take by mouth daily.      . Coenzyme Q10 200 MG capsule Take 200 mg by mouth daily.      Marland Kitchen. EPINEPHrine (EPI-PEN) 0.3 mg/0.3 mL DEVI Inject 0.3 mLs (0.3 mg total) into the muscle once. 2 Device 1  . fish oil-omega-3 fatty acids 1000 MG capsule Take 1 g by mouth daily.      . fluticasone (FLONASE) 50 MCG/ACT nasal spray INSTILL 2 SPRAYS INTO EACH NOSTRIL DAILY..the patient  uses prn    . folic acid (FOLVITE) 1 MG tablet Take 1 mg  by mouth daily.      Marland Kitchen. glucose blood (ONE TOUCH ULTRA TEST) test strip Use to test blood sugar once daily dx: 250.00 100 each 3  . LANOXIN 250 MCG tablet TAKE 1/2 TABLET BY MOUTH EVERY DAY 60 tablet 11  . LANTUS SOLOSTAR 100 UNIT/ML Solostar Pen INJECT 66 UNITS UNDER THE SKIN EVERY DAY 30 mL 0  . levothyroxine (SYNTHROID, LEVOTHROID) 100 MCG tablet Take 100 mcg by mouth daily before breakfast.    . metFORMIN (GLUCOPHAGE) 500 MG tablet Take 1,000 mg by mouth 2 (two) times daily with a meal.     . metoprolol succinate (TOPROL-XL) 25 MG 24 hr tablet TAKE 1 TABLET BY MOUTH EVERY DAY 90 tablet 3  . Milk Thistle 500 MG CAPS Take by mouth daily.      . montelukast (SINGULAIR) 10 MG tablet TAKE 1 TABLET BY MOUTH EVERY NIGHT AT BEDTIME 90 tablet 0  . Multiple Vitamin (MULTIVITAMIN) capsule Take 1 capsule by mouth daily.      Marland Kitchen. omeprazole (PRILOSEC) 20 MG capsule Take 1 capsule (20 mg total) by mouth 2 (two) times daily before a meal. 180 capsule 3  . vitamin B-12 (CYANOCOBALAMIN) 100 MCG tablet Take 50 mcg by mouth daily.      .Marland Kitchen  vitamin C (ASCORBIC ACID) 500 MG tablet Take 500 mg by mouth daily.      . Vitamins C E (CRANBERRY CONCENTRATE) 100-3 MG-UNIT CAPS Take 2 capsules by mouth daily.       No current facility-administered medications on file prior to visit.    Allergies  Allergen Reactions  . Cefdinir     REACTION: unspecified  . Ciprofloxacin     rash  . Clarithromycin     REACTION: weird dreams (on prednisone at the same time but has tolerated this in the past)  . Miglitol     REACTION: unspecified  . Niacin     REACTION: unspecified    Past Medical History  Diagnosis Date  . Allergy   . Asthma   . Diabetes mellitus   . GERD (gastroesophageal reflux disease)   . Hyperlipidemia   . Thyroid disease   . Atrial fibrillation   . Osteopenia   . Sleep disorder     Past Surgical History  Procedure Laterality Date  . Cholecystectomy    . Strabismus surgery  1946 / 1967  .  Vaginal delivery      x2  . Cervical cone biopsy  1960's  . Nasal polyp surgery  01/01    Family History  Problem Relation Age of Onset  . Asthma Mother   . Coronary artery disease Father     History   Social History  . Marital Status: Married    Spouse Name: N/A    Number of Children: 2  . Years of Education: N/A   Occupational History  .     Social History Main Topics  . Smoking status: Never Smoker   . Smokeless tobacco: Never Used  . Alcohol Use: Yes     Comment: 1 glass of wine per day  . Drug Use: No  . Sexual Activity: Not on file   Other Topics Concern  . Not on file   Social History Narrative   Has living will   Not sure about formal POA --but requests husband, then daughter [name] (and son Francis DowseJoel) to be health care POAs   Would accept resuscitation attempts   Not sure about feeding tubes   Review of Systems  No rash No vomiting or diarrhea (chronic loose stools some days) Appetite is off Having some back and abdominal pain--?relating to the force of the sneezing     Objective:   Physical Exam  Constitutional: She appears well-developed and well-nourished. No distress.  Mildly uncomfortable  HENT:  Mouth/Throat: Oropharynx is clear and moist. No oropharyngeal exudate.  Mild maxillary tenderness Moderate nasal inflammation TMs fine  Neck: Normal range of motion. Neck supple. No thyromegaly present.  Pulmonary/Chest: Effort normal and breath sounds normal. No respiratory distress. She has no wheezes. She has no rales.  Abdominal: Soft. She exhibits no distension. There is no tenderness. There is no rebound and no guarding.  Lymphadenopathy:    She has no cervical adenopathy.          Assessment & Plan:

## 2014-11-11 NOTE — Assessment & Plan Note (Signed)
Clearly seems to have viral infection now Prone to sinus infections Discussed supportive care---analgesics mostly Will give Rx for amoxil if he worsens

## 2014-11-11 NOTE — Patient Instructions (Signed)
Please start the amoxicillin if you are worsening in the next few days.

## 2014-11-11 NOTE — Progress Notes (Signed)
Pre visit review using our clinic review tool, if applicable. No additional management support is needed unless otherwise documented below in the visit note. 

## 2014-11-15 ENCOUNTER — Other Ambulatory Visit: Payer: Self-pay | Admitting: Internal Medicine

## 2014-11-16 ENCOUNTER — Other Ambulatory Visit: Payer: Self-pay | Admitting: Internal Medicine

## 2014-11-19 ENCOUNTER — Telehealth: Payer: Self-pay | Admitting: Pulmonary Disease

## 2014-11-19 NOTE — Telephone Encounter (Signed)
Spoke with pt's husband. Advised him that we could not refill the medication due to her needing an OV. I offered to schedule an appointment but declined and said he would call back. Nothing further was needed.

## 2014-11-20 ENCOUNTER — Other Ambulatory Visit: Payer: Self-pay

## 2014-11-20 MED ORDER — BECLOMETHASONE DIPROPIONATE 80 MCG/ACT IN AERS: 2.0000 | INHALATION_SPRAY | Freq: Two times a day (BID) | RESPIRATORY_TRACT | Status: DC

## 2014-12-17 DIAGNOSIS — Z961 Presence of intraocular lens: Secondary | ICD-10-CM | POA: Diagnosis not present

## 2014-12-17 LAB — HM DIABETES EYE EXAM

## 2014-12-23 ENCOUNTER — Encounter: Payer: Self-pay | Admitting: Internal Medicine

## 2014-12-23 ENCOUNTER — Ambulatory Visit (INDEPENDENT_AMBULATORY_CARE_PROVIDER_SITE_OTHER): Payer: Medicare PPO | Admitting: Internal Medicine

## 2014-12-23 VITALS — BP 140/80 | HR 80 | Temp 97.7°F

## 2014-12-23 DIAGNOSIS — R270 Ataxia, unspecified: Secondary | ICD-10-CM

## 2014-12-23 NOTE — Progress Notes (Signed)
Subjective:    Patient ID: [name], female    DOB: [birth year], 81 y.o.   MRN: [id number]  HPI Here with husband--- concerned about inner ear problems  Chronic motion sickness--even since childhood Has to be careful trying to walk straight--tends to list to side Yesterday she had a more significant spell---she had slept funny with neck to side Felt right arm numbness and tingling at times--- worried about a stroke Yesterday she had abnormal sensation in left arm (?and leg???) Was not able to walk straight No vertigo though  Tried 1 meclizine and it helped Then took another in evening and it resolved  No facial droop No problems with speech or swallowing No focal weakness  Uses meclizine every morning for chronic problems  Current Outpatient Prescriptions on File Prior to Visit  Medication Sig Dispense Refill  . albuterol (PROVENTIL HFA;VENTOLIN HFA) 108 (90 BASE) MCG/ACT inhaler Inhale 2 puffs into the lungs every 6 (six) hours as needed. 18 g 1  . ALPRAZolam (XANAX) 0.25 MG tablet TAKE 1 TABLET BY MOUTH TWICE DAILY 100 tablet 0  . aspirin 81 MG tablet Take 81 mg by mouth daily.      Marland Kitchen atorvastatin (LIPITOR) 20 MG tablet TAKE 1 TABLET BY MOUTH EVERY DAY 90 tablet 1  . B-D ULTRA-FINE 33 LANCETS MISC Use to test blood sugar twice daily dx: E11.9 200 each 3  . beclomethasone (QVAR) 80 MCG/ACT inhaler Inhale 2 puffs into the lungs 2 (two) times daily. 3 Inhaler 5  . calcitonin, salmon, (MIACALCIN/FORTICAL) 200 UNIT/ACT nasal spray USE 1 SPRAY IN THE NOSE EVERY DAY 3.7 mL 0  . CALCIUM-VITAMIN D PO Take 1 tablet by mouth daily.      . Chromium 100 MCG TABS Take by mouth daily.      . Coenzyme Q10 200 MG capsule Take 200 mg by mouth daily.      Marland Kitchen EPINEPHrine (EPI-PEN) 0.3 mg/0.3 mL DEVI Inject 0.3 mLs (0.3 mg total) into the muscle once. 2 Device 1  . fish oil-omega-3 fatty acids 1000 MG capsule Take 1 g by mouth daily.      . fluticasone (FLONASE) 50 MCG/ACT nasal spray  INSTILL 2 SPRAYS INTO EACH NOSTRIL DAILY..the patient  uses prn    . folic acid (FOLVITE) 1 MG tablet Take 1 mg by mouth daily.      Marland Kitchen glucose blood (ONE TOUCH ULTRA TEST) test strip Use to test blood sugar once daily dx: 250.00 100 each 3  . Insulin Pen Needle (B-D UF III MINI PEN NEEDLES) 31G X 5 MM MISC Use twice daily or as directed to check blood sugar dx: E11.9 200 each 3  . LANOXIN 250 MCG tablet TAKE 1/2 TABLET BY MOUTH EVERY DAY 60 tablet 11  . LANTUS SOLOSTAR 100 UNIT/ML Solostar Pen INJECT 66 UNITS UNDER THE SKIN EVERY DAY (Patient taking differently: inject 20units  am and 16 units pm) 30 mL 0  . levothyroxine (SYNTHROID, LEVOTHROID) 100 MCG tablet Take 100 mcg by mouth daily before breakfast.    . meclizine (ANTIVERT) 25 MG tablet   3  . metFORMIN (GLUCOPHAGE) 500 MG tablet Take 1,000 mg by mouth 2 (two) times daily with a meal.     . metoprolol succinate (TOPROL-XL) 25 MG 24 hr tablet TAKE 1 TABLET BY MOUTH EVERY DAY 90 tablet 3  . Milk Thistle 500 MG CAPS Take by mouth daily.      . montelukast (SINGULAIR) 10 MG tablet TAKE 1 TABLET  BY MOUTH EVERY NIGHT AT BEDTIME 90 tablet 0  . Multiple Vitamin (MULTIVITAMIN) capsule Take 1 capsule by mouth daily.      Marland Kitchen. omeprazole (PRILOSEC) 20 MG capsule Take 1 capsule (20 mg total) by mouth 2 (two) times daily before a meal. 180 capsule 3  . vitamin B-12 (CYANOCOBALAMIN) 100 MCG tablet Take 50 mcg by mouth daily.      . vitamin C (ASCORBIC ACID) 500 MG tablet Take 500 mg by mouth daily.      . Vitamins C E (CRANBERRY CONCENTRATE) 100-3 MG-UNIT CAPS Take 2 capsules by mouth daily.       No current facility-administered medications on file prior to visit.    Allergies  Allergen Reactions  . Cefdinir     REACTION: unspecified  . Ciprofloxacin     rash  . Clarithromycin     REACTION: weird dreams (on prednisone at the same time but has tolerated this in the past)  . Miglitol     REACTION: unspecified  . Niacin     REACTION: unspecified      Past Medical History  Diagnosis Date  . Allergy   . Asthma   . Diabetes mellitus   . GERD (gastroesophageal reflux disease)   . Hyperlipidemia   . Thyroid disease   . Atrial fibrillation   . Osteopenia   . Sleep disorder     Past Surgical History  Procedure Laterality Date  . Cholecystectomy    . Strabismus surgery  1946 / 1967  . Vaginal delivery      x2  . Cervical cone biopsy  1960's  . Nasal polyp surgery  01/01    Family History  Problem Relation Age of Onset  . Asthma Mother   . Coronary artery disease Father     History   Social History  . Marital Status: Married    Spouse Name: N/A    Number of Children: 2  . Years of Education: N/A   Occupational History  .     Social History Main Topics  . Smoking status: Never Smoker   . Smokeless tobacco: Never Used  . Alcohol Use: 0.0 oz/week    0 Not specified per week     Comment: 1 glass of wine per day  . Drug Use: No  . Sexual Activity: Not on file   Other Topics Concern  . Not on file   Social History Narrative   Has living will   Not sure about formal POA --but requests husband, then daughter [name] (and son Francis DowseJoel) to be health care POAs   Would accept resuscitation attempts   Not sure about feeding tubes   Review of Systems Losing hearing more in left ear Known HNP in C7 in past    Objective:   Physical Exam  Constitutional: She appears well-developed and well-nourished. No distress.  Eyes: Conjunctivae and EOM are normal. Pupils are equal, round, and reactive to light.  No apparent disc abnormality on left--couldn't see right  Neck: Normal range of motion. Neck supple.  Neurological: She is alert. She has normal strength. She displays no tremor. No cranial nerve deficit. She exhibits normal muscle tone. She displays a negative Romberg sign. Gait normal.  Did have some trouble trying to walk a straight line but fairly normal gait Slight overshoot with finger to nose with left hand           Assessment & Plan:

## 2014-12-23 NOTE — Assessment & Plan Note (Signed)
Had gait disturbance yesterday with vague left sensory changes Did improve with meclizine so doubt CVA  Exam reassuring today No further work up necessary --- this is probably vestibular. If recurs soon with any other symptoms, can consider MRI Okay to continue meclizine Has had canalith repositioning procedure in past---I wouldn't recommend that now

## 2014-12-23 NOTE — Progress Notes (Signed)
Pre visit review using our clinic review tool, if applicable. No additional management support is needed unless otherwise documented below in the visit note. 

## 2014-12-29 ENCOUNTER — Other Ambulatory Visit: Payer: Self-pay | Admitting: Internal Medicine

## 2014-12-29 NOTE — Telephone Encounter (Signed)
Last office visit 12/23/2014.  Last refilled 08/08/2014 for #100 with no refills.  Ok to refill?

## 2014-12-30 NOTE — Telephone Encounter (Signed)
Approved: #100 x 0

## 2014-12-30 NOTE — Telephone Encounter (Signed)
Rx phoned in to [facility] pharmacy as approved by Dr. Alphonsus SiasLetvak.

## 2015-01-01 ENCOUNTER — Encounter: Payer: Self-pay | Admitting: Internal Medicine

## 2015-01-19 ENCOUNTER — Other Ambulatory Visit: Payer: Self-pay | Admitting: Internal Medicine

## 2015-01-20 ENCOUNTER — Ambulatory Visit (INDEPENDENT_AMBULATORY_CARE_PROVIDER_SITE_OTHER): Payer: Medicare PPO | Admitting: Family Medicine

## 2015-01-20 ENCOUNTER — Encounter: Payer: Self-pay | Admitting: Family Medicine

## 2015-01-20 VITALS — BP 158/70 | HR 99 | Temp 98.1°F | Ht 62.5 in | Wt 154.6 lb

## 2015-01-20 DIAGNOSIS — J45901 Unspecified asthma with (acute) exacerbation: Secondary | ICD-10-CM

## 2015-01-20 DIAGNOSIS — J4521 Mild intermittent asthma with (acute) exacerbation: Secondary | ICD-10-CM

## 2015-01-20 MED ORDER — PREDNISONE 10 MG PO TABS: ORAL_TABLET | ORAL | Status: DC

## 2015-01-20 NOTE — Patient Instructions (Signed)
Take prednisone as directed with a meal  Update if not starting to improve in a week or if worsening  -especially if fever  You may want to discuss the Qvar with Dr Alphonsus SiasLetvak  Use the ventolin if needed

## 2015-01-20 NOTE — Assessment & Plan Note (Signed)
Cover with 30 mg prednisone taper Reassuring exam Watch out for fever or other infx symptoms  Ventolin prn  She does not like Qvar-will talk with her PCP about that Update if not starting to improve in a week or if worsening

## 2015-01-20 NOTE — Progress Notes (Signed)
Pre visit review using our clinic review tool, if applicable. No additional management support is needed unless otherwise documented below in the visit note. 

## 2015-01-20 NOTE — Progress Notes (Signed)
Subjective:    Patient ID: Hannah BelfastIrmgard P Dendinger, female    DOB: [birth year], 81 y.o.   MRN: [id number]  HPI Here with upper respiratory symptoms  Thinks it is allergy related  She has "sensitive bronchioles" --chart says asthma  She is supposed to use Qvar - she says it makes her shaky   Had a bad cough fit this am  ST mild  Lost her voice  When this happens she often needs prednisone (occasionally)   Has been coughing for over a week   Some phlegm -she swallows it - ? What color it is   Is using ventolin when she needs it   Patient Active Problem List   Diagnosis Date Noted  . Ataxia 12/23/2014  . Mild intermittent asthma with exacerbation 09/08/2014  . Asthma, chronic 10/15/2013  . Abnormal CXR (chest x-ray) 10/15/2013  . Cough 09/23/2013  . Routine general medical examination at a health care facility 03/19/2012  . Type II or unspecified type diabetes mellitus without mention of complication, not stated as uncontrolled 01/16/2012  . ANXIETY, SITUATIONAL 07/26/2010  . BACK PAIN, LUMBAR, WITH RADICULOPATHY 06/30/2009  . SLEEP DISORDER 11/12/2008  . ACTINIC KERATOSIS 06/18/2008  . HYPERTENSION 02/06/2008  . HYPOTHYROIDISM 01/12/2007  . HYPERLIPIDEMIA 01/12/2007  . ATRIAL FIBRILLATION 01/12/2007  . ALLERGIC RHINITIS 01/12/2007  . GERD 01/12/2007  . OSTEOPENIA 01/12/2007   Past Medical History  Diagnosis Date  . Allergy   . Asthma   . Diabetes mellitus   . GERD (gastroesophageal reflux disease)   . Hyperlipidemia   . Thyroid disease   . Atrial fibrillation   . Osteopenia   . Sleep disorder    Past Surgical History  Procedure Laterality Date  . Cholecystectomy    . Strabismus surgery  1946 / 1967  . Vaginal delivery      x2  . Cervical cone biopsy  1960's  . Nasal polyp surgery  01/01   History  Substance Use Topics  . Smoking status: Never Smoker   . Smokeless tobacco: Never Used  . Alcohol Use: 0.0 oz/week    0 Standard drinks or equivalent per week   Comment: 1 glass of wine per day   Family History  Problem Relation Age of Onset  . Asthma Mother   . Coronary artery disease Father    Allergies  Allergen Reactions  . Cefdinir     REACTION: unspecified  . Ciprofloxacin     rash  . Clarithromycin     REACTION: weird dreams (on prednisone at the same time but has tolerated this in the past)  . Miglitol     REACTION: unspecified  . Niacin     REACTION: unspecified  . Qvar [Beclomethasone]     Feels funny   Current Outpatient Prescriptions on File Prior to Visit  Medication Sig Dispense Refill  . albuterol (PROVENTIL HFA;VENTOLIN HFA) 108 (90 BASE) MCG/ACT inhaler Inhale 2 puffs into the lungs every 6 (six) hours as needed. 18 g 1  . ALPRAZolam (XANAX) 0.25 MG tablet TAKE 1 TABLET BY MOUTH TWICE DAILY 100 tablet 0  . aspirin 81 MG tablet Take 81 mg by mouth daily.      Marland Kitchen. atorvastatin (LIPITOR) 20 MG tablet TAKE 1 TABLET BY MOUTH EVERY DAY 90 tablet 1  . B-D ULTRA-FINE 33 LANCETS MISC Use to test blood sugar twice daily dx: E11.9 200 each 3  . calcitonin, salmon, (MIACALCIN/FORTICAL) 200 UNIT/ACT nasal spray USE 1 SPRAY IN THE NOSE EVERY DAY(ALTERNATING  NOSTRILS) 3.7 mL 1  . CALCIUM-VITAMIN D PO Take 1 tablet by mouth daily.      . Chromium 100 MCG TABS Take by mouth daily.      . Coenzyme Q10 200 MG capsule Take 200 mg by mouth daily.      Marland Kitchen EPINEPHrine (EPI-PEN) 0.3 mg/0.3 mL DEVI Inject 0.3 mLs (0.3 mg total) into the muscle once. 2 Device 1  . fish oil-omega-3 fatty acids 1000 MG capsule Take 1 g by mouth daily.      . fluticasone (FLONASE) 50 MCG/ACT nasal spray INSTILL 2 SPRAYS INTO EACH NOSTRIL DAILY..the patient  uses prn    . folic acid (FOLVITE) 1 MG tablet Take 1 mg by mouth daily.      Marland Kitchen glucose blood (ONE TOUCH ULTRA TEST) test strip Use to test blood sugar once daily dx: 250.00 100 each 3  . Insulin Pen Needle (B-D UF III MINI PEN NEEDLES) 31G X 5 MM MISC Use twice daily or as directed to check blood sugar dx: E11.9  200 each 3  . LANOXIN 250 MCG tablet TAKE 1/2 TABLET BY MOUTH EVERY DAY 60 tablet 11  . LANTUS SOLOSTAR 100 UNIT/ML Solostar Pen INJECT 66 UNITS UNDER THE SKIN EVERY DAY (Patient taking differently: inject 20units  am and 16 units pm) 30 mL 0  . levothyroxine (SYNTHROID, LEVOTHROID) 100 MCG tablet Take 100 mcg by mouth daily before breakfast.    . meclizine (ANTIVERT) 25 MG tablet   3  . metFORMIN (GLUCOPHAGE) 500 MG tablet Take 1,000 mg by mouth 2 (two) times daily with a meal.     . metoprolol succinate (TOPROL-XL) 25 MG 24 hr tablet TAKE 1 TABLET BY MOUTH EVERY DAY 90 tablet 3  . Milk Thistle 500 MG CAPS Take by mouth daily.      . montelukast (SINGULAIR) 10 MG tablet TAKE 1 TABLET BY MOUTH EVERY NIGHT AT BEDTIME 90 tablet 3  . Multiple Vitamin (MULTIVITAMIN) capsule Take 1 capsule by mouth daily.      Marland Kitchen omeprazole (PRILOSEC) 20 MG capsule Take 1 capsule (20 mg total) by mouth 2 (two) times daily before a meal. 180 capsule 3  . Probiotic Product (ALIGN) 4 MG CAPS Take 4 mg by mouth daily.    . vitamin B-12 (CYANOCOBALAMIN) 100 MCG tablet Take 50 mcg by mouth daily.      . vitamin C (ASCORBIC ACID) 500 MG tablet Take 500 mg by mouth daily.      . Vitamins C E (CRANBERRY CONCENTRATE) 100-3 MG-UNIT CAPS Take 2 capsules by mouth daily.       No current facility-administered medications on file prior to visit.      Review of Systems Review of Systems  Constitutional: Negative for fever, appetite change,  and unexpected weight change.  ENT pos for runny nose/ mild congestion, neg for sinus pain  Eyes: Negative for pain and visual disturbance.  Respiratory: Negative for shortness of breath.  pos for cough/ scant wheezing  Cardiovascular: Negative for cp or palpitations    Gastrointestinal: Negative for nausea, diarrhea and constipation.  Genitourinary: Negative for urgency and frequency.  Skin: Negative for pallor or rash   Neurological: Negative for weakness, light-headedness, numbness and  headaches.  Hematological: Negative for adenopathy. Does not bruise/bleed easily.  Psychiatric/Behavioral: Negative for dysphoric mood. The patient is not nervous/anxious.         Objective:   Physical Exam  Constitutional: She appears well-developed and well-nourished. No distress.  HENT:  Head: Normocephalic and atraumatic.  Right Ear: External ear normal.  Left Ear: External ear normal.  Mouth/Throat: Oropharynx is clear and moist. No oropharyngeal exudate.  Nares are boggy with clear rhinorrhea Clear rhinorrhea  No facial tenderness   Eyes: Conjunctivae and EOM are normal. Pupils are equal, round, and reactive to light. Right eye exhibits no discharge. Left eye exhibits no discharge.  Neck: Normal range of motion. Neck supple.  Cardiovascular: Normal rate and regular rhythm.   Pulmonary/Chest: Effort normal. No respiratory distress. She has wheezes. She has no rales.  Scant wheeze on forced exp with slt prolonged exp phase No rales or rhonchi  Harsh bs Fair air exch  Lymphadenopathy:    She has no cervical adenopathy.  Neurological: She is alert.  Skin: Skin is warm and dry. No rash noted. No erythema. No pallor.  Psychiatric: She has a normal mood and affect.          Assessment & Plan:   Problem List Items Addressed This Visit      Respiratory   Mild intermittent asthma with exacerbation - Primary    Cover with 30 mg prednisone taper Reassuring exam Watch out for fever or other infx symptoms  Ventolin prn  She does not like Qvar-will talk with her PCP about that Update if not starting to improve in a week or if worsening        Relevant Medications   predniSONE (DELTASONE) tablet

## 2015-02-13 ENCOUNTER — Ambulatory Visit (INDEPENDENT_AMBULATORY_CARE_PROVIDER_SITE_OTHER): Payer: Medicare PPO | Admitting: Internal Medicine

## 2015-02-13 ENCOUNTER — Encounter: Payer: Self-pay | Admitting: Internal Medicine

## 2015-02-13 VITALS — BP 136/70 | HR 97 | Temp 98.2°F | Wt 156.8 lb

## 2015-02-13 DIAGNOSIS — K529 Noninfective gastroenteritis and colitis, unspecified: Secondary | ICD-10-CM | POA: Diagnosis not present

## 2015-02-13 NOTE — Progress Notes (Signed)
Subjective:    Patient ID: [name], female    DOB: 1931-08-17, 81 y.o.   MRN: [id number]  HPI Here due to fatigue With husband as usual  "I don't know what the problem is" Gets diarrhea from metformin---unpredictable Had this 2-3 days ago Felt weird yesterday---"like I just want to go to bed" Feels a little off balance Appetite is off--has had to make herself eat  No nausea or vomiting Low grade fever 100.2 last night Has had some sweating No chest pain  Current Outpatient Prescriptions on File Prior to Visit  Medication Sig Dispense Refill  . albuterol (PROVENTIL HFA;VENTOLIN HFA) 108 (90 BASE) MCG/ACT inhaler Inhale 2 puffs into the lungs every 6 (six) hours as needed. 18 g 1  . ALPRAZolam (XANAX) 0.25 MG tablet TAKE 1 TABLET BY MOUTH TWICE DAILY 100 tablet 0  . aspirin 81 MG tablet Take 81 mg by mouth daily.      Marland Kitchen atorvastatin (LIPITOR) 20 MG tablet TAKE 1 TABLET BY MOUTH EVERY DAY 90 tablet 1  . B-D ULTRA-FINE 33 LANCETS MISC Use to test blood sugar twice daily dx: E11.9 200 each 3  . calcitonin, salmon, (MIACALCIN/FORTICAL) 200 UNIT/ACT nasal spray USE 1 SPRAY IN THE NOSE EVERY DAY(ALTERNATING NOSTRILS) 3.7 mL 1  . CALCIUM-VITAMIN D PO Take 1 tablet by mouth daily.      . Chromium 100 MCG TABS Take by mouth daily.      . Coenzyme Q10 200 MG capsule Take 200 mg by mouth daily.      Marland Kitchen EPINEPHrine (EPI-PEN) 0.3 mg/0.3 mL DEVI Inject 0.3 mLs (0.3 mg total) into the muscle once. 2 Device 1  . fish oil-omega-3 fatty acids 1000 MG capsule Take 1 g by mouth daily.      . fluticasone (FLONASE) 50 MCG/ACT nasal spray INSTILL 2 SPRAYS INTO EACH NOSTRIL DAILY..the patient  uses prn    . folic acid (FOLVITE) 1 MG tablet Take 1 mg by mouth daily.      Marland Kitchen glucose blood (ONE TOUCH ULTRA TEST) test strip Use to test blood sugar once daily dx: 250.00 100 each 3  . Insulin Pen Needle (B-D UF III MINI PEN NEEDLES) 31G X 5 MM MISC Use twice daily or as directed to check blood sugar dx:  E11.9 200 each 3  . LANOXIN 250 MCG tablet TAKE 1/2 TABLET BY MOUTH EVERY DAY 60 tablet 11  . LANTUS SOLOSTAR 100 UNIT/ML Solostar Pen INJECT 66 UNITS UNDER THE SKIN EVERY DAY (Patient taking differently: inject 20units  am and 16 units pm) 30 mL 0  . levothyroxine (SYNTHROID, LEVOTHROID) 100 MCG tablet Take 100 mcg by mouth daily before breakfast.    . meclizine (ANTIVERT) 25 MG tablet   3  . metFORMIN (GLUCOPHAGE) 500 MG tablet Take 1,000 mg by mouth 2 (two) times daily with a meal.     . metoprolol succinate (TOPROL-XL) 25 MG 24 hr tablet TAKE 1 TABLET BY MOUTH EVERY DAY 90 tablet 3  . Milk Thistle 500 MG CAPS Take by mouth daily.      . montelukast (SINGULAIR) 10 MG tablet TAKE 1 TABLET BY MOUTH EVERY NIGHT AT BEDTIME 90 tablet 3  . Multiple Vitamin (MULTIVITAMIN) capsule Take 1 capsule by mouth daily.      Marland Kitchen omeprazole (PRILOSEC) 20 MG capsule Take 1 capsule (20 mg total) by mouth 2 (two) times daily before a meal. 180 capsule 3  . Probiotic Product (ALIGN) 4 MG CAPS Take 4  mg by mouth daily.    . vitamin B-12 (CYANOCOBALAMIN) 100 MCG tablet Take 50 mcg by mouth daily.      . vitamin C (ASCORBIC ACID) 500 MG tablet Take 500 mg by mouth daily.      . Vitamins C E (CRANBERRY CONCENTRATE) 100-3 MG-UNIT CAPS Take 2 capsules by mouth daily.       No current facility-administered medications on file prior to visit.    Allergies  Allergen Reactions  . Cefdinir     REACTION: unspecified  . Ciprofloxacin     rash  . Clarithromycin     REACTION: weird dreams (on prednisone at the same time but has tolerated this in the past)  . Miglitol     REACTION: unspecified  . Niacin     REACTION: unspecified  . Qvar [Beclomethasone]     Feels funny    Past Medical History  Diagnosis Date  . Allergy   . Asthma   . Diabetes mellitus   . GERD (gastroesophageal reflux disease)   . Hyperlipidemia   . Thyroid disease   . Atrial fibrillation   . Osteopenia   . Sleep disorder     Past  Surgical History  Procedure Laterality Date  . Cholecystectomy    . Strabismus surgery  1946 / 1967  . Vaginal delivery      x2  . Cervical cone biopsy  1960's  . Nasal polyp surgery  01/01    Family History  Problem Relation Age of Onset  . Asthma Mother   . Coronary artery disease Father     History   Social History  . Marital Status: Married    Spouse Name: N/A  . Number of Children: 2  . Years of Education: N/A   Occupational History  .     Social History Main Topics  . Smoking status: Never Smoker   . Smokeless tobacco: Never Used  . Alcohol Use: 0.0 oz/week    0 Standard drinks or equivalent per week     Comment: 1 glass of wine per day  . Drug Use: No  . Sexual Activity: Not on file   Other Topics Concern  . Not on file   Social History Narrative   Has living will   Not sure about formal POA --but requests husband, then daughter [name] (and son Francis DowseJoel) to be health care POAs   Would accept resuscitation attempts   Not sure about feeding tubes   Review of Systems She has been checking her sugars-- doing fine  Due for appt with Dr Renae FicklePaul No cough or SOB No blood in stool    Objective:   Physical Exam  Constitutional: She appears well-developed and well-nourished. No distress.  Neck: Normal range of motion. Neck supple. No thyromegaly present.  Cardiovascular: Normal rate, regular rhythm and normal heart sounds.  Exam reveals no gallop.   No murmur heard. Pulmonary/Chest: Effort normal and breath sounds normal. No respiratory distress. She has no wheezes. She has no rales.  Abdominal: Soft. Bowel sounds are normal. She exhibits no distension. There is no tenderness. There is no rebound and no guarding.  Lymphadenopathy:    She has no cervical adenopathy.  Skin:  Slight red rash around anterior neck          Assessment & Plan:

## 2015-02-13 NOTE — Progress Notes (Signed)
Pre visit review using our clinic review tool, if applicable. No additional management support is needed unless otherwise documented below in the visit note. 

## 2015-02-13 NOTE — Assessment & Plan Note (Signed)
History sounds like viral infection with persistent fatigue and anorexia No worrisome findings Reassured---just observation Watch sugars closely till eating normally again

## 2015-03-07 NOTE — Op Note (Signed)
PATIENT NAMJerrell [name]:  [name], [name] MR#:  [id number] DATE OF BIRTH:  July 05, 1931  DATE OF PROCEDURE:  02/11/2014  PREOPERATIVE DIAGNOSIS: Visually significant cataract of the right eye.   POSTOPERATIVE DIAGNOSIS: Visually significant cataract of the right eye.   OPERATIVE PROCEDURE: Cataract extraction by phacoemulsification with implant of intraocular lens to right eye.   SURGEON: Galen ManilaWilliam Lemma Tetro, MD.   ANESTHESIA:  1. Managed anesthesia care.  2. Topical tetracaine drops followed by 2% Xylocaine jelly applied in the preoperative holding area.   COMPLICATIONS: None.   TECHNIQUE:  Stop and chop.  DESCRIPTION OF PROCEDURE: The patient was examined and consented in the preoperative holding area where the aforementioned topical anesthesia was applied to the right eye and then brought back to the Operating Room where the right eye was prepped and draped in the usual sterile ophthalmic fashion and a lid speculum was placed. A paracentesis was created with the side port blade and the anterior chamber was filled with viscoelastic. A near clear corneal incision was performed with the steel keratome. A continuous curvilinear capsulorrhexis was performed with a cystotome followed by the capsulorrhexis forceps. Hydrodissection and hydrodelineation were carried out with BSS on a blunt cannula. The lens was removed in a stop and chop  technique and the remaining cortical material was removed with the irrigation-aspiration handpiece. The capsular bag was inflated with viscoelastic and the Tecnis ZCB00 19.0-diopter lens, serial number [id number] was placed in the capsular bag without complication. The remaining viscoelastic was removed from the eye with the irrigation-aspiration handpiece. The wounds were hydrated. The anterior chamber was flushed with Miostat and the eye was inflated to physiologic pressure. 0.1 mL of cefuroxime concentration 10 mg/mL was placed in the anterior chamber. The wounds were found to be  water tight. The eye was dressed with Vigamox. The patient was given protective glasses to wear throughout the day and a shield with which to sleep tonight. The patient was also given drops with which to begin a drop regimen today and will follow-up with me in one day.    ____________________________ Hannah FieldWilliam L. Chadley Dziedzic, MD wlp:dmm D: 02/11/2014 21:27:52 ET T: 02/11/2014 23:03:10 ET JOB#: [id number]  cc: Elynor Kallenberger L. Alanys Godino, MD, <Dictator> Hannah FieldWILLIAM L Macallan Ord MD ELECTRONICALLY SIGNED 02/12/2014 8:58

## 2015-03-12 ENCOUNTER — Other Ambulatory Visit: Payer: Self-pay | Admitting: Internal Medicine

## 2015-03-14 ENCOUNTER — Other Ambulatory Visit: Payer: Self-pay | Admitting: Internal Medicine

## 2015-04-20 LAB — HEMOGLOBIN A1C: Hgb A1c MFr Bld: 6.7 % — AB (ref 4.0–6.0)

## 2015-04-22 ENCOUNTER — Ambulatory Visit: Payer: Medicare PPO | Admitting: Internal Medicine

## 2015-04-28 DIAGNOSIS — E10649 Type 1 diabetes mellitus with hypoglycemia without coma: Secondary | ICD-10-CM | POA: Diagnosis not present

## 2015-04-28 DIAGNOSIS — Z794 Long term (current) use of insulin: Secondary | ICD-10-CM | POA: Diagnosis not present

## 2015-04-28 DIAGNOSIS — E119 Type 2 diabetes mellitus without complications: Secondary | ICD-10-CM | POA: Diagnosis not present

## 2015-04-28 DIAGNOSIS — E039 Hypothyroidism, unspecified: Secondary | ICD-10-CM | POA: Diagnosis not present

## 2015-05-04 ENCOUNTER — Encounter: Payer: Self-pay | Admitting: Internal Medicine

## 2015-05-04 ENCOUNTER — Ambulatory Visit (INDEPENDENT_AMBULATORY_CARE_PROVIDER_SITE_OTHER): Payer: Medicare PPO | Admitting: Internal Medicine

## 2015-05-04 VITALS — BP 136/76 | HR 70 | Temp 98.4°F | Wt 156.0 lb

## 2015-05-04 DIAGNOSIS — I10 Essential (primary) hypertension: Secondary | ICD-10-CM

## 2015-05-04 DIAGNOSIS — E785 Hyperlipidemia, unspecified: Secondary | ICD-10-CM

## 2015-05-04 DIAGNOSIS — E119 Type 2 diabetes mellitus without complications: Secondary | ICD-10-CM

## 2015-05-04 DIAGNOSIS — F39 Unspecified mood [affective] disorder: Secondary | ICD-10-CM

## 2015-05-04 DIAGNOSIS — I480 Paroxysmal atrial fibrillation: Secondary | ICD-10-CM

## 2015-05-04 DIAGNOSIS — I4891 Unspecified atrial fibrillation: Secondary | ICD-10-CM | POA: Diagnosis not present

## 2015-05-04 LAB — HEPATIC FUNCTION PANEL
ALT: 32 U/L (ref 0–35)
AST: 25 U/L (ref 0–37)
Albumin: 4.1 g/dL (ref 3.5–5.2)
Alkaline Phosphatase: 74 U/L (ref 39–117)
BILIRUBIN DIRECT: 0.1 mg/dL (ref 0.0–0.3)
BILIRUBIN TOTAL: 0.5 mg/dL (ref 0.2–1.2)
TOTAL PROTEIN: 6.7 g/dL (ref 6.0–8.3)

## 2015-05-04 LAB — HM DIABETES FOOT EXAM

## 2015-05-04 LAB — LIPID PANEL
CHOL/HDL RATIO: 2
Cholesterol: 102 mg/dL (ref 0–200)
HDL: 41.3 mg/dL (ref >39.00)
LDL CALC: 33 mg/dL (ref 0–99)
NONHDL: 60.7
Triglycerides: 140 mg/dL (ref 0.0–149.0)
VLDL: 28 mg/dL (ref 0.0–40.0)

## 2015-05-04 NOTE — Assessment & Plan Note (Signed)
No problems with statin Due for labs 

## 2015-05-04 NOTE — Progress Notes (Signed)
Subjective:    Patient ID: [name], female    DOB: 07-09-1931, 81 y.o.   MRN: [id number]  HPI Here for follow up of chronic medical conditions Continues to see endocrinologist at [facility]--Dr [facility]  Asthma seems to have settled down No recent cough or infections---just occasional cough from allergies in throat Breathing is okay  Some diarrhea which again improved--metformin decreased to just AM Reviewed lab work-- has added B12 due to a low level  No palpitations of note---chronic mild symptoms if she eats the wrong thing (and she avoids caffeine) No chest pain No edema  Gets blah-- mild depression at times No daily symptoms or suicidal thoughts Gets occasional anxiety--uses the alprazolam prn  Current Outpatient Prescriptions on File Prior to Visit  Medication Sig Dispense Refill  . ALPRAZolam (XANAX) 0.25 MG tablet TAKE 1 TABLET BY MOUTH TWICE DAILY 100 tablet 0  . aspirin 81 MG tablet Take 81 mg by mouth daily.      Marland Kitchen atorvastatin (LIPITOR) 20 MG tablet TAKE 1 TABLET BY MOUTH EVERY DAY 90 tablet 3  . B-D ULTRA-FINE 33 LANCETS MISC Use to test blood sugar twice daily dx: E11.9 200 each 3  . calcitonin, salmon, (MIACALCIN/FORTICAL) 200 UNIT/ACT nasal spray USE 1 SPRAY IN THE NOSE EVERY DAY(ALTERNATING NOSTRILS) 3.7 mL 1  . CALCIUM-VITAMIN D PO Take 1 tablet by mouth daily.      . Chromium 100 MCG TABS Take by mouth daily.      . Coenzyme Q10 200 MG capsule Take 200 mg by mouth daily.      . fish oil-omega-3 fatty acids 1000 MG capsule Take 1 g by mouth daily.      . folic acid (FOLVITE) 1 MG tablet Take 1 mg by mouth daily.      . Insulin Pen Needle (B-D UF III MINI PEN NEEDLES) 31G X 5 MM MISC Use twice daily or as directed to check blood sugar dx: E11.9 200 each 3  . LANOXIN 250 MCG tablet TAKE 1/2 TABLET BY MOUTH EVERY DAY 60 tablet 11  . levothyroxine (SYNTHROID, LEVOTHROID) 100 MCG tablet Take 100 mcg by mouth daily before breakfast.    . meclizine (ANTIVERT) 25  MG tablet   3  . metoprolol succinate (TOPROL-XL) 25 MG 24 hr tablet TAKE 1 TABLET BY MOUTH EVERY DAY 90 tablet 3  . montelukast (SINGULAIR) 10 MG tablet TAKE 1 TABLET BY MOUTH EVERY NIGHT AT BEDTIME 90 tablet 3  . omeprazole (PRILOSEC) 20 MG capsule TAKE 1 CAPSULE BY MOUTH TWICE DAILY BEFORE A MEAL 180 capsule 3   No current facility-administered medications on file prior to visit.    Allergies  Allergen Reactions  . Cefdinir     REACTION: unspecified  . Ciprofloxacin     rash  . Clarithromycin     REACTION: weird dreams (on prednisone at the same time but has tolerated this in the past)  . Miglitol     REACTION: unspecified  . Niacin     REACTION: unspecified  . Qvar [Beclomethasone] Other (See Comments)    Feels funny Feels funny    Past Medical History  Diagnosis Date  . Allergy   . Asthma   . Diabetes mellitus   . GERD (gastroesophageal reflux disease)   . Hyperlipidemia   . Thyroid disease   . Atrial fibrillation   . Osteopenia   . Sleep disorder     Past Surgical History  Procedure Laterality Date  . Cholecystectomy    .  Strabismus surgery  1946 / 1967  . Vaginal delivery      x2  . Cervical cone biopsy  1960's  . Nasal polyp surgery  01/01    Family History  Problem Relation Age of Onset  . Asthma Mother   . Coronary artery disease Father     History   Social History  . Marital Status: Married    Spouse Name: N/A  . Number of Children: 2  . Years of Education: N/A   Occupational History  .     Social History Main Topics  . Smoking status: Never Smoker   . Smokeless tobacco: Never Used  . Alcohol Use: 0.0 oz/week    0 Standard drinks or equivalent per week     Comment: 1 glass of wine per day  . Drug Use: No  . Sexual Activity: Not on file   Other Topics Concern  . Not on file   Social History Narrative   Has living will   Not sure about formal POA --but requests husband, then daughter [name] (and son [name]) to be health care POAs     Would accept resuscitation attempts   Not sure about feeding tubes   Review of Systems Sleeps well Intermittent nocturia Weight stable Bowels generally better---"trying to adjust"    Objective:   Physical Exam  Constitutional: She appears well-developed and well-nourished. No distress.  Neck: Normal range of motion. Neck supple. No thyromegaly present.  Cardiovascular: Normal rate, regular rhythm, normal heart sounds and intact distal pulses.  Exam reveals no gallop.   No murmur heard. Pulmonary/Chest: Effort normal and breath sounds normal. No respiratory distress. She has no wheezes. She has no rales.  Abdominal: Soft. There is no tenderness.  Musculoskeletal: She exhibits no edema or tenderness.  Lymphadenopathy:    She has no cervical adenopathy.  Neurological:  Normal sensation in feet  Skin: No rash noted. No erythema.  No foot lesions  Psychiatric: She has a normal mood and affect. Her behavior is normal.          Assessment & Plan:

## 2015-05-04 NOTE — Assessment & Plan Note (Signed)
BP Readings from Last 3 Encounters:  05/04/15 136/76  02/13/15 136/70  01/20/15 158/70   Reasonable control

## 2015-05-04 NOTE — Progress Notes (Signed)
Pre visit review using our clinic review tool, if applicable. No additional management support is needed unless otherwise documented below in the visit note. 

## 2015-05-04 NOTE — Assessment & Plan Note (Signed)
Sees Dr Ronny Flurry control

## 2015-05-04 NOTE — Assessment & Plan Note (Signed)
Self limited short dysthmia and anxiety at times Uses the alprazolam prn

## 2015-05-04 NOTE — Assessment & Plan Note (Signed)
No clear exacerbations on metoprolol and dig Not sure dig helping but will continue---check level

## 2015-05-05 LAB — DIGOXIN LEVEL: DIGOXIN LVL: 0.8 ug/L (ref 0.8–2.0)

## 2015-07-10 DIAGNOSIS — E11319 Type 2 diabetes mellitus with unspecified diabetic retinopathy without macular edema: Secondary | ICD-10-CM | POA: Diagnosis not present

## 2015-07-10 DIAGNOSIS — H26492 Other secondary cataract, left eye: Secondary | ICD-10-CM | POA: Diagnosis not present

## 2015-07-10 LAB — HM DIABETES EYE EXAM

## 2015-07-13 ENCOUNTER — Encounter: Payer: Self-pay | Admitting: Internal Medicine

## 2015-07-13 ENCOUNTER — Ambulatory Visit (INDEPENDENT_AMBULATORY_CARE_PROVIDER_SITE_OTHER): Payer: Medicare PPO | Admitting: Internal Medicine

## 2015-07-13 VITALS — BP 140/80 | HR 78 | Temp 97.7°F | Wt 155.0 lb

## 2015-07-13 DIAGNOSIS — R05 Cough: Secondary | ICD-10-CM

## 2015-07-13 DIAGNOSIS — R059 Cough, unspecified: Secondary | ICD-10-CM | POA: Insufficient documentation

## 2015-07-13 NOTE — Progress Notes (Signed)
Pre visit review using our clinic review tool, if applicable. No additional management support is needed unless otherwise documented below in the visit note. 

## 2015-07-13 NOTE — Assessment & Plan Note (Signed)
Seems to be from reflux or allergy Not sick and no fever, etc Discussed that antibiotics not needed Reviewed CT from a year ago--no follow up indicated Discussed avoiding food at night, keep HOB elevated, etc

## 2015-07-13 NOTE — Progress Notes (Signed)
Subjective:    Patient ID: [name], female    DOB: 1931-05-13, 81 y.o.   MRN: [id number]  HPI Here due to cough  Not really productive--more of a tickle sensation Notes it more in AM-- ?irritation Wonders if this is allergic She worries about past abnormal CXR  No sig SOB--not recent problems at all No fever but occasionally feels that her face is warm  Concerned about her hands Reports that about 20 years ago-- she got right hand slammed in a car door Did ice it then Then checked 10 days later---x-ray was okay (?or hairline fracture) Also noted trigger finger in 2nd finger Has scar across right palm from accident at age 80 Now notes a snap when she opens 2nd and 4th on right and 2nd-5th on left No pain other than right ring finger  Current Outpatient Prescriptions on File Prior to Visit  Medication Sig Dispense Refill  . ACCU-CHEK SMARTVIEW test strip TEST BLOOD SUGAR BID UTD  3  . ALPRAZolam (XANAX) 0.25 MG tablet TAKE 1 TABLET BY MOUTH TWICE DAILY 100 tablet 0  . aspirin 81 MG tablet Take 81 mg by mouth daily.      Marland Kitchen atorvastatin (LIPITOR) 20 MG tablet TAKE 1 TABLET BY MOUTH EVERY DAY 90 tablet 3  . B-D ULTRA-FINE 33 LANCETS MISC Use to test blood sugar twice daily dx: E11.9 200 each 3  . calcitonin, salmon, (MIACALCIN/FORTICAL) 200 UNIT/ACT nasal spray USE 1 SPRAY IN THE NOSE EVERY DAY(ALTERNATING NOSTRILS) 3.7 mL 1  . CALCIUM-VITAMIN D PO Take 1 tablet by mouth daily.      . Chromium 100 MCG TABS Take by mouth daily.      . Coenzyme Q10 200 MG capsule Take 200 mg by mouth daily.      . fish oil-omega-3 fatty acids 1000 MG capsule Take 1 g by mouth daily.      . folic acid (FOLVITE) 1 MG tablet Take 1 mg by mouth daily.      . Insulin Glargine (LANTUS SOLOSTAR) 100 UNIT/ML Solostar Pen INJECT 20 UNITS UNDER THE SKIN EVERY MORNING AND 16 UNITS EVERY EVENING AS DIRECTED    . Insulin Pen Needle (B-D UF III MINI PEN NEEDLES) 31G X 5 MM MISC Use twice daily or as directed  to check blood sugar dx: E11.9 200 each 3  . LANOXIN 250 MCG tablet TAKE 1/2 TABLET BY MOUTH EVERY DAY 60 tablet 11  . levothyroxine (SYNTHROID, LEVOTHROID) 100 MCG tablet Take 100 mcg by mouth daily before breakfast.    . meclizine (ANTIVERT) 25 MG tablet   3  . metFORMIN (GLUCOPHAGE) 1000 MG tablet Take 1,000 mg by mouth daily with breakfast.    . metoprolol succinate (TOPROL-XL) 25 MG 24 hr tablet TAKE 1 TABLET BY MOUTH EVERY DAY 90 tablet 3  . montelukast (SINGULAIR) 10 MG tablet TAKE 1 TABLET BY MOUTH EVERY NIGHT AT BEDTIME 90 tablet 3  . omeprazole (PRILOSEC) 20 MG capsule TAKE 1 CAPSULE BY MOUTH TWICE DAILY BEFORE A MEAL 180 capsule 3  . vitamin B-12 (CYANOCOBALAMIN) 1000 MCG tablet Take 1,000 mcg by mouth daily.     No current facility-administered medications on file prior to visit.    Allergies  Allergen Reactions  . Cefdinir     REACTION: unspecified  . Ciprofloxacin     rash  . Clarithromycin     REACTION: weird dreams (on prednisone at the same time but has tolerated this in the past)  .  Miglitol     REACTION: unspecified  . Niacin     REACTION: unspecified  . Qvar [Beclomethasone] Other (See Comments)    Feels funny Feels funny    Past Medical History  Diagnosis Date  . Allergy   . Asthma   . Diabetes mellitus   . GERD (gastroesophageal reflux disease)   . Hyperlipidemia   . Thyroid disease   . Atrial fibrillation   . Osteopenia   . Sleep disorder     Past Surgical History  Procedure Laterality Date  . Cholecystectomy    . Strabismus surgery  1946 / 1967  . Vaginal delivery      x2  . Cervical cone biopsy  1960's  . Nasal polyp surgery  01/01    Family History  Problem Relation Age of Onset  . Asthma Mother   . Coronary artery disease Father     Social History   Social History  . Marital Status: Married    Spouse Name: N/A  . Number of Children: 2  . Years of Education: N/A   Occupational History  .     Social History Main Topics  .  Smoking status: Never Smoker   . Smokeless tobacco: Never Used  . Alcohol Use: 0.0 oz/week    0 Standard drinks or equivalent per week     Comment: 1 glass of wine per day  . Drug Use: No  . Sexual Activity: Not on file   Other Topics Concern  . Not on file   Social History Narrative   Has living will   Not sure about formal POA --but requests husband, then daughter [name] (and son [name]) to be health care POAs   Would accept resuscitation attempts   Not sure about feeding tubes   Review of Systems Occasional palpitations after alcohol Heartburn usually quiet on the PPI--does get some reflux symptoms at times No swallowing problems    Objective:   Physical Exam  Constitutional: She appears well-developed and well-nourished. No distress.  Neck: Normal range of motion. Neck supple. No thyromegaly present.  Pulmonary/Chest: Effort normal and breath sounds normal. No respiratory distress. She has no wheezes. She has no rales.  Musculoskeletal:  Several hand PIPs snap when extending--but no inflammation and all extend fully. Normal strength. Reassured--no action needed  Lymphadenopathy:    She has no cervical adenopathy.          Assessment & Plan:

## 2015-07-21 DIAGNOSIS — E119 Type 2 diabetes mellitus without complications: Secondary | ICD-10-CM | POA: Diagnosis not present

## 2015-07-21 DIAGNOSIS — E538 Deficiency of other specified B group vitamins: Secondary | ICD-10-CM | POA: Diagnosis not present

## 2015-07-21 DIAGNOSIS — E039 Hypothyroidism, unspecified: Secondary | ICD-10-CM | POA: Diagnosis not present

## 2015-07-28 ENCOUNTER — Other Ambulatory Visit: Payer: Self-pay | Admitting: Internal Medicine

## 2015-07-30 DIAGNOSIS — E039 Hypothyroidism, unspecified: Secondary | ICD-10-CM | POA: Diagnosis not present

## 2015-07-30 DIAGNOSIS — E11649 Type 2 diabetes mellitus with hypoglycemia without coma: Secondary | ICD-10-CM | POA: Diagnosis not present

## 2015-07-30 DIAGNOSIS — Z794 Long term (current) use of insulin: Secondary | ICD-10-CM | POA: Diagnosis not present

## 2015-07-30 DIAGNOSIS — E538 Deficiency of other specified B group vitamins: Secondary | ICD-10-CM | POA: Diagnosis not present

## 2015-07-31 ENCOUNTER — Other Ambulatory Visit: Payer: Self-pay | Admitting: Internal Medicine

## 2015-08-03 NOTE — Telephone Encounter (Signed)
12/30/14 

## 2015-08-03 NOTE — Telephone Encounter (Signed)
rx called into pharmacy

## 2015-08-03 NOTE — Telephone Encounter (Signed)
Approved: okay #[phone] x 0

## 2015-08-06 ENCOUNTER — Ambulatory Visit (INDEPENDENT_AMBULATORY_CARE_PROVIDER_SITE_OTHER): Payer: Medicare PPO

## 2015-08-06 DIAGNOSIS — Z23 Encounter for immunization: Secondary | ICD-10-CM

## 2015-08-10 DIAGNOSIS — H26491 Other secondary cataract, right eye: Secondary | ICD-10-CM | POA: Diagnosis not present

## 2015-08-28 ENCOUNTER — Other Ambulatory Visit: Payer: Self-pay | Admitting: Internal Medicine

## 2015-10-26 ENCOUNTER — Other Ambulatory Visit: Payer: Self-pay | Admitting: Internal Medicine

## 2015-10-27 ENCOUNTER — Other Ambulatory Visit: Payer: Self-pay | Admitting: Internal Medicine

## 2015-10-27 NOTE — Telephone Encounter (Signed)
Mr Baird LyonsCasey called for status of refill; advised done and he voiced understanding.

## 2015-10-28 ENCOUNTER — Other Ambulatory Visit: Payer: Self-pay | Admitting: Internal Medicine

## 2015-11-11 ENCOUNTER — Other Ambulatory Visit: Payer: Self-pay | Admitting: Internal Medicine

## 2015-11-11 NOTE — Telephone Encounter (Signed)
Pt left v/m requesting # [phone] refill of lanoxin to [facility] [address]. Advised pt done. Refill done per protocol; last seen f/u 04/2015.

## 2015-12-05 ENCOUNTER — Other Ambulatory Visit: Payer: Self-pay | Admitting: Internal Medicine

## 2015-12-07 NOTE — Telephone Encounter (Signed)
rx sent to pharmacy by e-script rx called into pharmacy  

## 2015-12-07 NOTE — Telephone Encounter (Signed)
Approved: #[phone] x 0 for alprazolam Calcitonin for a year

## 2015-12-07 NOTE — Telephone Encounter (Signed)
08/03/2015 

## 2016-01-09 ENCOUNTER — Other Ambulatory Visit: Payer: Self-pay | Admitting: Internal Medicine

## 2016-01-12 DIAGNOSIS — Z794 Long term (current) use of insulin: Secondary | ICD-10-CM | POA: Diagnosis not present

## 2016-01-12 DIAGNOSIS — E11649 Type 2 diabetes mellitus with hypoglycemia without coma: Secondary | ICD-10-CM | POA: Diagnosis not present

## 2016-01-19 DIAGNOSIS — E039 Hypothyroidism, unspecified: Secondary | ICD-10-CM | POA: Diagnosis not present

## 2016-01-19 DIAGNOSIS — E538 Deficiency of other specified B group vitamins: Secondary | ICD-10-CM | POA: Diagnosis not present

## 2016-01-19 DIAGNOSIS — E1165 Type 2 diabetes mellitus with hyperglycemia: Secondary | ICD-10-CM | POA: Diagnosis not present

## 2016-01-19 DIAGNOSIS — Z794 Long term (current) use of insulin: Secondary | ICD-10-CM | POA: Diagnosis not present

## 2016-01-25 ENCOUNTER — Other Ambulatory Visit: Payer: Self-pay | Admitting: Internal Medicine

## 2016-02-29 ENCOUNTER — Telehealth: Payer: Self-pay

## 2016-02-29 NOTE — Telephone Encounter (Signed)
Please let her know that if she is only taking zyrtec--she can add a loratadine 10mg  or fexofenadine 180mg  daily as well

## 2016-02-29 NOTE — Telephone Encounter (Signed)
PLEASE NOTE: All timestamps contained within this report are represented as [place] Standard Time. CONFIDENTIALTY NOTICE: This fax transmission is intended only for the addressee. It contains information that is legally privileged, confidential or otherwise protected from use or disclosure. If you are not the intended recipient, you are strictly prohibited from reviewing, disclosing, copying using or disseminating any of this information or taking any action in reliance on or regarding this information. If you have received this fax in error, please notify us immediately by telephone so that we can arrange for its return to [place]. Phone: [phone], Toll-Free: [phone], Fax: [phone] Page: 1 of 2 Call Id: [id number] [facility] Night - Client TELEPHONE ADVICE RECORD [facility] Call Center Patient Name: [name] Gender: Female DOB: [birth year] Age: 82 Y 29 D Return Phone Number: [phone] (Primary) Address: City/State/Zip: [place] Client [facility] Night - Client Client Site [facility] - Night Physician Alphonsus Sias, Richard Contact Type Call Who Is Calling Patient / Member / Family / Caregiver Call Type Triage / Clinical Caller Name [name] Relationship To Patient Spouse Return Phone Number [phone] (Primary) Chief Complaint Cough Reason for Call Symptomatic / Request for Health Information Initial Comment Caller states his wife has a cough. Has a itch feeling in her chest. PreDisposition Did not know what to do Translation No Nurse Assessment Nurse: Debera Lat, RN, Tinnie Gens Date/Time Lamount Cohen Time): 02/26/2016 10:59:23 AM Confirm and document reason for call. If symptomatic, describe symptoms. You must click the next button to save text entered. ---Caller states his wife has a cough. Has a itch feeling in her chest. Started 2 weeks ago. No fever. Has the patient traveled out of the country within the last  30 days? ---No Does the patient have any new or worsening symptoms? ---Yes Will a triage be completed? ---Yes Related visit to physician within the last 2 weeks? ---No Does the PT have any chronic conditions? (i.e. diabetes, asthma, etc.) ---Yes List chronic conditions. ---HTN, Diabetes type 2 Is this a behavioral health or substance abuse call? ---No Guidelines Guideline Title Affirmed Question Affirmed Notes Nurse Date/Time (Eastern Time) Cough - Acute Non- Productive [1] Patient also has allergy symptoms (e.g., itchy eyes, clear nasal discharge, postnasal drip) AND [2] they are acting up Debera Lat, RN, Tinnie Gens 02/26/2016 11:01:37 AM Disp. Time Lamount Cohen Time) Disposition Final User PLEASE NOTE: All timestamps contained within this report are represented as [place] Standard Time. CONFIDENTIALTY NOTICE: This fax transmission is intended only for the addressee. It contains information that is legally privileged, confidential or otherwise protected from use or disclosure. If you are not the intended recipient, you are strictly prohibited from reviewing, disclosing, copying using or disseminating any of this information or taking any action in reliance on or regarding this information. If you have received this fax in error, please notify us immediately by telephone so that we can arrange for its return to [place]. Phone: [phone], Toll-Free: [phone], Fax: [phone] Page: 2 of 2 Call Id: [id number] 02/26/2016 11:04:46 AM See PCP When Office is Open (within 3 days) Yes Debera Lat, RN, Irma Newness Understands: Yes Disagree/Comply: Comply Care Advice Given Per Guideline SEE PCP WITHIN 3 DAYS: * You need to be seen within 2 or 3 days. Call your doctor during regular office hours and make an appointment. An urgent care center is often the best source of care if your doctor's office is closed or you can't get an appointment. NOTE: If office will be open tomorrow, tell  caller to call  then, not in 3 days. * Cough drops can help a lot, especially for mild coughs. They reduce coughing by soothing your irritated throat and removing that tickle sensation in the back of the throat. COUGH DROPS FOR COUGH: * Cough drops also have the advantage of portability - you can carry them with you. * Cough drops are available over-the-counter (OTC). HOME REMEDY - HARD CANDY: Hard candy works just as well as a medicineflavored OTC cough drops. HUMIDIFIER: If the air is dry, use a humidifier in the bedroom. (Reason: dry air makes coughs worse) ANTIHISTAMINE MEDICATIONS FOR HAYFEVER: * Antihistamines help reduce sneezing, itching and runny nose. * You may need to take antihistamines continuously during pollen season (Reason: continuously is the key to control). * CETIRIZINE is a newer (second generation) antihistamine. The dosage of cetirizine (e.g., OTC Zyrtec) is 10 mg once a day. * LORATADINE is a newer (second generation) antihistamine. The dosage of loratadine (e.g., OTC Claritin, Alavert) is 10 mg once a day. * Loratadine and cetrizine cause less sleepiness than diphenhydramine (Benadryl) or Chlorpheniramine (Chlortrimeton). CALL BACK IF: * Fever over 103 F (39.4 C) * Difficulty breathing occurs * You become worse. CARE ADVICE given per Cough - Acute Non-Productive (Adult) guideline.

## 2016-02-29 NOTE — Telephone Encounter (Signed)
Pt said she is sure the cough is allergies; pt can tell the cough is not as bad when she is inside and pt is taking zyrtec. Pt does not think she needs to be seen. No fever,SOB or wheezing. Pt will cb if condition changes or worsens for an appt.

## 2016-03-01 ENCOUNTER — Other Ambulatory Visit: Payer: Self-pay

## 2016-03-01 MED ORDER — ALBUTEROL SULFATE HFA 108 (90 BASE) MCG/ACT IN AERS: 2.0000 | INHALATION_SPRAY | Freq: Four times a day (QID) | RESPIRATORY_TRACT | Status: DC | PRN

## 2016-03-01 NOTE — Telephone Encounter (Signed)
Rx sent electronically.  

## 2016-03-01 NOTE — Telephone Encounter (Signed)
Spoke to patient

## 2016-03-03 ENCOUNTER — Ambulatory Visit (INDEPENDENT_AMBULATORY_CARE_PROVIDER_SITE_OTHER)
Admission: RE | Admit: 2016-03-03 | Discharge: 2016-03-03 | Disposition: A | Discharge status: Home / Self Care | Payer: Medicare PPO | Source: Ambulatory Visit | Attending: Primary Care | Admitting: Primary Care

## 2016-03-03 ENCOUNTER — Encounter: Payer: Self-pay | Admitting: Primary Care

## 2016-03-03 ENCOUNTER — Ambulatory Visit (INDEPENDENT_AMBULATORY_CARE_PROVIDER_SITE_OTHER): Payer: Medicare PPO | Admitting: Primary Care

## 2016-03-03 VITALS — BP 144/80 | HR 68 | Temp 98.1°F | Wt 158.8 lb

## 2016-03-03 DIAGNOSIS — R938 Abnormal findings on diagnostic imaging of other specified body structures: Secondary | ICD-10-CM

## 2016-03-03 DIAGNOSIS — J4520 Mild intermittent asthma, uncomplicated: Secondary | ICD-10-CM

## 2016-03-03 DIAGNOSIS — R05 Cough: Secondary | ICD-10-CM | POA: Diagnosis not present

## 2016-03-03 DIAGNOSIS — J302 Other seasonal allergic rhinitis: Secondary | ICD-10-CM | POA: Diagnosis not present

## 2016-03-03 DIAGNOSIS — R9389 Abnormal findings on diagnostic imaging of other specified body structures: Secondary | ICD-10-CM

## 2016-03-03 NOTE — Assessment & Plan Note (Signed)
Improved since initiation of Claritin 10 mg yesterday. Exam today without evidence of cough, shortness of breath, suspicious lung sounds. Discussed to continue current regimen and to use albuterol inhaler PRN. Will repeat chest xray today to ensure no changes in nodule found in 2015.

## 2016-03-03 NOTE — Assessment & Plan Note (Signed)
Stable today. Faint wheezing to left upper lobe on exam. No distress. Continue albuterol PRN.

## 2016-03-03 NOTE — Patient Instructions (Signed)
Your lungs sound great today!  Continue Claritin 10 mg as discussed.   Continue albuterol inhaler as needed for any shortness of breath and/or wheezing.  Nasal Congestion: Try using Flonase (fluticasone) nasal spray. Instill 2 sprays in each nostril once daily.   Complete xray(s) prior to leaving today. I will notify you of your results once received.  Please notify us if your symptoms become worse, you develop fevers, you start coughing up thick, green, mucous.  It was a pleasure meeting you!

## 2016-03-03 NOTE — Progress Notes (Signed)
Subjective:    Patient ID: [name], female    DOB: May 28, 1931, 82 y.o.   MRN: [id number]  HPI  Ms. Baird LyonsCasey is an 82 year old female with a history of chronic asthma and allergic rhinitis who presents today with a chief complaint of cough. She also reports shortness of breath, wheezing, rhinnorhea. She is currently managed on Singulair 10 mg and Claritin 10 mg that she started yesterday.  Her cough has been present for the past 2 weeks. She also feels a tickle to her chest.  Her symptoms are worse when going outdoors. She's feeling much improved since starting the Claritin yesterday. Denies fevers, shortness of breath, wheezing today.    She is worried about the nodule that was noted on CT chest in 2015 and would like repeat xray today.  Review of Systems  Constitutional: Negative for fever and fatigue.  HENT: Positive for congestion. Negative for ear pain, sinus pressure, sneezing and sore throat.   Eyes: Negative for itching.       Watery eyes.  Respiratory: Positive for cough. Negative for shortness of breath and wheezing.   Cardiovascular: Negative for chest pain.       Past Medical History  Diagnosis Date  . Allergy   . Asthma   . Diabetes mellitus   . GERD (gastroesophageal reflux disease)   . Hyperlipidemia   . Thyroid disease   . Atrial fibrillation (HCC)   . Osteopenia   . Sleep disorder      Social History   Social History  . Marital Status: Married    Spouse Name: N/A  . Number of Children: 2  . Years of Education: N/A   Occupational History  .     Social History Main Topics  . Smoking status: Never Smoker   . Smokeless tobacco: Never Used  . Alcohol Use: 0.0 oz/week    0 Standard drinks or equivalent per week     Comment: 1 glass of wine per day  . Drug Use: No  . Sexual Activity: Not on file   Other Topics Concern  . Not on file   Social History Narrative   Has living will   Not sure about formal POA --but requests husband, then daughter  [name] (and son Francis DowseJoel) to be health care POAs   Would accept resuscitation attempts   Not sure about feeding tubes    Past Surgical History  Procedure Laterality Date  . Cholecystectomy    . Strabismus surgery  1946 / 1967  . Vaginal delivery      x2  . Cervical cone biopsy  1960's  . Nasal polyp surgery  01/01    Family History  Problem Relation Age of Onset  . Asthma Mother   . Coronary artery disease Father     Allergies  Allergen Reactions  . Cefdinir     REACTION: unspecified  . Ciprofloxacin     rash  . Clarithromycin     REACTION: weird dreams (on prednisone at the same time but has tolerated this in the past)  . Miglitol     REACTION: unspecified  . Niacin     REACTION: unspecified  . Qvar [Beclomethasone] Other (See Comments)    Feels funny Feels funny    Current Outpatient Prescriptions on File Prior to Visit  Medication Sig Dispense Refill  . ACCU-CHEK SMARTVIEW test strip TEST BLOOD SUGAR BID UTD  3  . albuterol (PROVENTIL HFA;VENTOLIN HFA) 108 (90 Base) MCG/ACT inhaler Inhale  2 puffs into the lungs every 6 (six) hours as needed. 18 g 0  . ALPRAZolam (XANAX) 0.25 MG tablet TAKE 1 TABLET BY MOUTH TWICE DAILY 100 tablet 0  . aspirin 81 MG tablet Take 81 mg by mouth daily.      Marland Kitchen atorvastatin (LIPITOR) 20 MG tablet TAKE 1 TABLET BY MOUTH EVERY DAY 90 tablet 3  . B-D ULTRA-FINE 33 LANCETS MISC Use to test blood sugar twice daily dx: E11.9 200 each 3  . calcitonin, salmon, (MIACALCIN/FORTICAL) 200 UNIT/ACT nasal spray USE 1 SPRAY IN THE NOSE EVERY DAY(ALTERNATING NOSTRILS) 3.7 mL 11  . CALCIUM-VITAMIN D PO Take 1 tablet by mouth daily.      . Chromium 100 MCG TABS Take by mouth daily.      . Coenzyme Q10 200 MG capsule Take 200 mg by mouth daily.      . fish oil-omega-3 fatty acids 1000 MG capsule Take 1 g by mouth daily.      . folic acid (FOLVITE) 1 MG tablet Take 1 mg by mouth daily.      . Insulin Glargine (LANTUS SOLOSTAR) 100 UNIT/ML Solostar Pen  INJECT 20 UNITS UNDER THE SKIN EVERY MORNING AND 16 UNITS EVERY EVENING AS DIRECTED    . Insulin Pen Needle (B-D UF III MINI PEN NEEDLES) 31G X 5 MM MISC Use to inject insulin twice daily as directed dx: E11.9 200 each 3  . LANOXIN 250 MCG tablet TAKE 1/2 TABLET BY MOUTH EVERY DAY 45 tablet 1  . levothyroxine (SYNTHROID, LEVOTHROID) 100 MCG tablet Take 100 mcg by mouth daily before breakfast.    . meclizine (ANTIVERT) 25 MG tablet TAKE 1 TABLET BY MOUTH THREE TIMES DAILY AS NEEDED 90 tablet 0  . metFORMIN (GLUCOPHAGE) 1000 MG tablet Take 1,000 mg by mouth daily with breakfast.    . metoprolol succinate (TOPROL-XL) 25 MG 24 hr tablet TAKE 1 TABLET BY MOUTH EVERY DAY 90 tablet 3  . montelukast (SINGULAIR) 10 MG tablet TAKE 1 TABLET BY MOUTH EVERY NIGHT AT BEDTIME 90 tablet 0  . omeprazole (PRILOSEC) 20 MG capsule TAKE 1 CAPSULE BY MOUTH TWICE DAILY BEFORE A MEAL 180 capsule 3  . vitamin B-12 (CYANOCOBALAMIN) 1000 MCG tablet Take 1,000 mcg by mouth daily.     No current facility-administered medications on file prior to visit.    BP 144/80 mmHg  Pulse 68  Temp(Src) 98.1 F (36.7 C) (Oral)  Wt 158 lb 12.8 oz (72.031 kg)  SpO2 96%    Objective:   Physical Exam  Constitutional: Vital signs are normal. She appears well-nourished. She does not appear ill.  HENT:  Right Ear: Tympanic membrane and ear canal normal.  Left Ear: Tympanic membrane and ear canal normal.  Nose: Mucosal edema present. Right sinus exhibits no maxillary sinus tenderness and no frontal sinus tenderness. Left sinus exhibits no maxillary sinus tenderness and no frontal sinus tenderness.  Mouth/Throat: Oropharynx is clear and moist.  Eyes: Conjunctivae are normal.  Neck: Neck supple.  Cardiovascular: Normal rate and regular rhythm.   Pulmonary/Chest: Effort normal and breath sounds normal. She has no decreased breath sounds. She has no rhonchi. She has no rales.  Faint wheezing to left upper lobe, overall unremarkable.    Lymphadenopathy:    She has no cervical adenopathy.  Skin: Skin is warm and dry.          Assessment & Plan:

## 2016-03-07 ENCOUNTER — Other Ambulatory Visit: Payer: Self-pay | Admitting: Internal Medicine

## 2016-04-05 ENCOUNTER — Other Ambulatory Visit: Payer: Self-pay | Admitting: Internal Medicine

## 2016-04-05 NOTE — Telephone Encounter (Signed)
Rx sent electronically.  

## 2016-04-20 DIAGNOSIS — E1165 Type 2 diabetes mellitus with hyperglycemia: Secondary | ICD-10-CM | POA: Diagnosis not present

## 2016-04-20 DIAGNOSIS — Z794 Long term (current) use of insulin: Secondary | ICD-10-CM | POA: Diagnosis not present

## 2016-04-20 DIAGNOSIS — E039 Hypothyroidism, unspecified: Secondary | ICD-10-CM | POA: Diagnosis not present

## 2016-04-20 LAB — HEMOGLOBIN A1C: Hemoglobin A1C: 8.3

## 2016-04-25 ENCOUNTER — Other Ambulatory Visit: Payer: Self-pay | Admitting: Internal Medicine

## 2016-04-28 DIAGNOSIS — Z794 Long term (current) use of insulin: Secondary | ICD-10-CM | POA: Diagnosis not present

## 2016-04-28 DIAGNOSIS — E039 Hypothyroidism, unspecified: Secondary | ICD-10-CM | POA: Diagnosis not present

## 2016-04-28 DIAGNOSIS — E538 Deficiency of other specified B group vitamins: Secondary | ICD-10-CM | POA: Diagnosis not present

## 2016-04-28 DIAGNOSIS — E1165 Type 2 diabetes mellitus with hyperglycemia: Secondary | ICD-10-CM | POA: Diagnosis not present

## 2016-05-03 ENCOUNTER — Other Ambulatory Visit: Payer: Self-pay | Admitting: Internal Medicine

## 2016-05-05 DIAGNOSIS — E538 Deficiency of other specified B group vitamins: Secondary | ICD-10-CM | POA: Diagnosis not present

## 2016-05-05 DIAGNOSIS — E039 Hypothyroidism, unspecified: Secondary | ICD-10-CM | POA: Diagnosis not present

## 2016-05-05 DIAGNOSIS — Z794 Long term (current) use of insulin: Secondary | ICD-10-CM | POA: Diagnosis not present

## 2016-05-05 DIAGNOSIS — E1165 Type 2 diabetes mellitus with hyperglycemia: Secondary | ICD-10-CM | POA: Diagnosis not present

## 2016-05-16 ENCOUNTER — Telehealth: Payer: Self-pay

## 2016-05-16 NOTE — Telephone Encounter (Signed)
Spoke to [name] about making an appointment. He will follow-up with her and call to make one if she agrees

## 2016-05-16 NOTE — Telephone Encounter (Signed)
Female left v/m that just noticed that pt is not taking Lopressor and wants to know why pt has not been taking med for "some time" and wants to know how to get pt restarted on Lopressor. 01/16/2012 office visit lopressor was stopped and started metoprolol succinate. Please advise. [facility] [address]

## 2016-05-16 NOTE — Telephone Encounter (Signed)
Spoke to [place]. He said [facility] and she has not been taking the metoprolol xl for at least a year and a half.  Spoke to Dr Alphonsus SiasLetvak, it was more for her A.Fib If she has not been having any issues then she can just stay off of it for now. Spoke to [place]. He said she is going to worry about this.

## 2016-05-16 NOTE — Telephone Encounter (Signed)
Let them know that lopressor is the same metoprolol --but she is now on the long acting metoprolol (that is safer)

## 2016-05-23 ENCOUNTER — Ambulatory Visit: Payer: Medicare PPO | Admitting: Internal Medicine

## 2016-05-24 ENCOUNTER — Ambulatory Visit (INDEPENDENT_AMBULATORY_CARE_PROVIDER_SITE_OTHER): Payer: Medicare PPO | Admitting: Internal Medicine

## 2016-05-24 ENCOUNTER — Encounter: Payer: Self-pay | Admitting: Internal Medicine

## 2016-05-24 VITALS — BP 150/80 | HR 71 | Temp 97.6°F | Wt 158.0 lb

## 2016-05-24 DIAGNOSIS — IMO0001 Reserved for inherently not codable concepts without codable children: Secondary | ICD-10-CM

## 2016-05-24 DIAGNOSIS — Z794 Long term (current) use of insulin: Secondary | ICD-10-CM

## 2016-05-24 DIAGNOSIS — I480 Paroxysmal atrial fibrillation: Secondary | ICD-10-CM

## 2016-05-24 DIAGNOSIS — E1165 Type 2 diabetes mellitus with hyperglycemia: Secondary | ICD-10-CM

## 2016-05-24 LAB — HM DIABETES FOOT EXAM

## 2016-05-24 MED ORDER — METOPROLOL SUCCINATE ER 25 MG PO TB24: 25.0000 mg | ORAL_TABLET | Freq: Every day | ORAL | Status: DC

## 2016-05-24 NOTE — Progress Notes (Signed)
Subjective:    Patient ID: [name], female    DOB: 09/30/1931, 82 y.o.   MRN: [id number]  HPI Here for follow up about metoprolol and distant atrial fib With daughter  Had been off the lopressor for a year of so Didn't even realize it Will notice heart racing after caffeine and occasionally at other times May be related to nerves/stress as well Still on the lanoxin  Reviewed the atrial fibrillation Occurred when she was 82--she was just sitting and then "aware of my heart" Seen at hospital-- rapid ventricular rate 1 week in the hospita--converted and then was on monitor Has had spells at times still (though she avoids the caffeine)  Current Outpatient Prescriptions on File Prior to Visit  Medication Sig Dispense Refill  . ACCU-CHEK SMARTVIEW test strip TEST BLOOD SUGAR BID UTD  3  . albuterol (PROVENTIL HFA;VENTOLIN HFA) 108 (90 Base) MCG/ACT inhaler Inhale 2 puffs into the lungs every 6 (six) hours as needed. 18 g 0  . ALPRAZolam (XANAX) 0.25 MG tablet TAKE 1 TABLET BY MOUTH TWICE DAILY 100 tablet 0  . aspirin 81 MG tablet Take 81 mg by mouth daily.      Marland Kitchen. atorvastatin (LIPITOR) 20 MG tablet TAKE 1 TABLET BY MOUTH EVERY DAY 90 tablet 0  . B-D ULTRA-FINE 33 LANCETS MISC Use to test blood sugar twice daily dx: E11.9 200 each 3  . calcitonin, salmon, (MIACALCIN/FORTICAL) 200 UNIT/ACT nasal spray USE 1 SPRAY IN THE NOSE EVERY DAY(ALTERNATING NOSTRILS) 3.7 mL 11  . CALCIUM-VITAMIN D PO Take 1 tablet by mouth daily.      . Chromium 100 MCG TABS Take by mouth daily.      . Coenzyme Q10 200 MG capsule Take 200 mg by mouth daily.      . fish oil-omega-3 fatty acids 1000 MG capsule Take 1 g by mouth daily.      . folic acid (FOLVITE) 1 MG tablet Take 1 mg by mouth daily.      . Insulin Glargine (LANTUS SOLOSTAR) 100 UNIT/ML Solostar Pen INJECT 20 UNITS UNDER THE SKIN EVERY MORNING AND 16 UNITS EVERY EVENING AS DIRECTED    . Insulin Pen Needle (B-D UF III MINI PEN NEEDLES) 31G X 5  MM MISC Use to inject insulin twice daily as directed dx: E11.9 200 each 3  . LANOXIN 250 MCG tablet TAKE 1/2 TABLET BY MOUTH EVERY DAY 45 tablet 0  . levothyroxine (SYNTHROID, LEVOTHROID) 100 MCG tablet Take 100 mcg by mouth daily before breakfast.    . meclizine (ANTIVERT) 25 MG tablet TAKE 1 TABLET BY MOUTH THREE TIMES DAILY AS NEEDED 90 tablet 0  . metFORMIN (GLUCOPHAGE) 1000 MG tablet Take 1,000 mg by mouth daily with breakfast.    . metoprolol succinate (TOPROL-XL) 25 MG 24 hr tablet TAKE 1 TABLET BY MOUTH EVERY DAY 90 tablet 3  . montelukast (SINGULAIR) 10 MG tablet TAKE 1 TABLET BY MOUTH EVERY NIGHT AT BEDTIME 90 tablet 0  . omeprazole (PRILOSEC) 20 MG capsule TAKE 1 CAPSULE BY MOUTH TWICE DAILY BEFORE A MEAL 180 capsule 0  . vitamin B-12 (CYANOCOBALAMIN) 1000 MCG tablet Take 1,000 mcg by mouth daily.     No current facility-administered medications on file prior to visit.    Allergies  Allergen Reactions  . Cefdinir     REACTION: unspecified  . Ciprofloxacin     rash  . Clarithromycin     REACTION: weird dreams (on prednisone at the same time but  has tolerated this in the past)  . Miglitol     REACTION: unspecified  . Niacin     REACTION: unspecified  . Qvar [Beclomethasone] Other (See Comments)    Feels funny Feels funny    Past Medical History  Diagnosis Date  . Allergy   . Asthma   . Diabetes mellitus   . GERD (gastroesophageal reflux disease)   . Hyperlipidemia   . Thyroid disease   . Atrial fibrillation (HCC)   . Osteopenia   . Sleep disorder     Past Surgical History  Procedure Laterality Date  . Cholecystectomy    . Strabismus surgery  1946 / 1967  . Vaginal delivery      x2  . Cervical cone biopsy  1960's  . Nasal polyp surgery  01/01    Family History  Problem Relation Age of Onset  . Asthma Mother   . Coronary artery disease Father     Social History   Social History  . Marital Status: Married    Spouse Name: N/A  . Number of  Children: 2  . Years of Education: N/A   Occupational History  .     Social History Main Topics  . Smoking status: Never Smoker   . Smokeless tobacco: Never Used  . Alcohol Use: 0.0 oz/week    0 Standard drinks or equivalent per week     Comment: 1 glass of wine per day  . Drug Use: No  . Sexual Activity: Not on file   Other Topics Concern  . Not on file   Social History Narrative   Has living will   Not sure about formal POA --but requests husband, then daughter [name] (and son [name]) to be health care POAs   Would accept resuscitation attempts   Not sure about feeding tubes   Review of Systems Continues with the endocrinologist--Dr Renae Fickle Has been adjusting-- now with novolog and less lantus. Considering pump No foot sores, numbness or pain    Objective:   Physical Exam  Neck: Normal range of motion. Neck supple.  Cardiovascular: Normal rate, regular rhythm, normal heart sounds and intact distal pulses.  Exam reveals no gallop.   No murmur heard. Pulmonary/Chest: Effort normal and breath sounds normal. No respiratory distress. She has no wheezes. She has no rales.  Musculoskeletal: She exhibits no edema.  Lymphadenopathy:    She has no cervical adenopathy.  Neurological:  Normal sensation in feet  Skin: No rash noted. No erythema.  No foot lesions  Psychiatric: She has a normal mood and affect. Her behavior is normal.          Assessment & Plan:

## 2016-05-24 NOTE — Assessment & Plan Note (Signed)
Working with Dr Renae FicklePaul to get diabetes back under control

## 2016-05-24 NOTE — Assessment & Plan Note (Signed)
Distant history but still may have paroxysms On digoxin Will restart metoprolol Cardiology evaluation---should probably be on NOAC

## 2016-05-24 NOTE — Progress Notes (Signed)
Pre visit review using our clinic review tool, if applicable. No additional management support is needed unless otherwise documented below in the visit note. 

## 2016-05-27 ENCOUNTER — Telehealth: Payer: Self-pay

## 2016-05-27 MED ORDER — INSULIN PEN NEEDLE 31G X 5 MM MISC: Status: DC

## 2016-05-27 NOTE — Telephone Encounter (Signed)
Mr Baird LyonsCasey said difficult to get intouch with endocrinologist (Dr Renae FicklePaul is [place] of [place]) and needs to get pen needles today.[facility] [address] Dr Renae FicklePaul is having pt take 4 shots per day. Refilled per request. Pt seen 05/24/16.

## 2016-05-27 NOTE — Telephone Encounter (Signed)
[name] [name] said endo has increased insulin to 4 shots per day instead of 2 shots per day. Pt needs refill on pen needles; left v/m for [name] [name] to cb.

## 2016-06-09 ENCOUNTER — Other Ambulatory Visit: Payer: Self-pay | Admitting: Internal Medicine

## 2016-06-14 ENCOUNTER — Ambulatory Visit (INDEPENDENT_AMBULATORY_CARE_PROVIDER_SITE_OTHER): Payer: Medicare PPO | Admitting: Internal Medicine

## 2016-06-14 ENCOUNTER — Encounter: Payer: Self-pay | Admitting: Internal Medicine

## 2016-06-14 ENCOUNTER — Telehealth: Payer: Self-pay | Admitting: Internal Medicine

## 2016-06-14 ENCOUNTER — Ambulatory Visit: Payer: Medicare PPO | Admitting: Cardiology

## 2016-06-14 VITALS — BP 142/82 | HR 70 | Temp 98.2°F | Resp 20 | Wt 161.0 lb

## 2016-06-14 DIAGNOSIS — E785 Hyperlipidemia, unspecified: Secondary | ICD-10-CM | POA: Diagnosis not present

## 2016-06-14 DIAGNOSIS — E039 Hypothyroidism, unspecified: Secondary | ICD-10-CM | POA: Diagnosis not present

## 2016-06-14 DIAGNOSIS — I10 Essential (primary) hypertension: Secondary | ICD-10-CM | POA: Diagnosis not present

## 2016-06-14 MED ORDER — LOSARTAN POTASSIUM 100 MG PO TABS: 100.0000 mg | ORAL_TABLET | Freq: Every day | ORAL | 3 refills | Status: DC

## 2016-06-14 NOTE — Assessment & Plan Note (Signed)
stable overall by history and exam, recent data reviewed with pt, and pt to continue medical treatment as before,  to f/u any worsening symptoms or concerns Lab Results  Component Value Date   TSH 0.37 11/19/2013   For f/u lab

## 2016-06-14 NOTE — Assessment & Plan Note (Signed)
Ok for lipid check, consider statin for LDL > 70 Lab Results  Component Value Date   LDLCALC 33 05/04/2015

## 2016-06-14 NOTE — Progress Notes (Signed)
Pre visit review using our clinic review tool, if applicable. No additional management support is needed unless otherwise documented below in the visit note. 

## 2016-06-14 NOTE — Patient Instructions (Signed)
Please take all new medication as prescribed - the losartan 100 mg per day, but only after lab work in the AM, and then seeing your Cardiologist in the afternoon as you already have planned  Please continue all other medications as before, and refills have been done if requested.  Please have the pharmacy call with any other refills you may need.  Please keep your appointments with your specialists as you may have planned  Please go to the LAB in the Basement (turn left off the elevator) for the tests to be done tomorrow or as you can  You will be contacted by phone if any changes need to be made immediately.  Otherwise, you will receive a letter about your results with an explanation, but please check with MyChart first.  Please remember to sign up for MyChart if you have not done so, as this will be important to you in the future with finding out test results, communicating by private email, and scheduling acute appointments online when needed.  Please see Dr Alphonsus Sias in 2 weeks for follow up Blood Pressure (and probably kidney number re-check)

## 2016-06-14 NOTE — Telephone Encounter (Signed)
Pt has appt with Dr Oliver Barre 06/14/16 at 6:15.

## 2016-06-14 NOTE — Telephone Encounter (Signed)
Patient Name: [name]  DOB: Nov 18, 1930    Initial Comment Caller states her BP is running high- 206/106   Nurse Assessment  Nurse: Sherilyn Cooter, RN, Thurmond Butts Date/Time (Eastern Time): 06/14/2016 10:31:04 AM  Confirm and document reason for call. If symptomatic, describe symptoms. You must click the next button to save text entered. ---Caller states that her mother's BP was 191/90 about an hour ago. They are in the car and cannot get a reading now. Denies headache. Denies unilateral weakness or numbness.  Has the patient traveled out of the country within the last 30 days? ---No  Does the patient have any new or worsening symptoms? ---Yes  Will a triage be completed? ---Yes  Related visit to physician within the last 2 weeks? ---No  Does the PT have any chronic conditions? (i.e. diabetes, asthma, etc.) ---Yes  List chronic conditions. ---HTN, Diabetes, Heart trouble, Hypothyroidism,  Is this a behavioral health or substance abuse call? ---No     Guidelines    Guideline Title Affirmed Question Affirmed Notes  High Blood Pressure [1] Taking BP medications AND [2] feels is having side effects (e.g., impotence, cough, dizzy upon standing)    Final Disposition User   See PCP When Office is Open (within 3 days) Sherilyn Cooter, RN, Thurmond Butts    Comments  No appointments at [facility] or [facility]. I was able to schedule an appointment at [facility] for 6:15;m today with Dr. Oliver Barre.   Referrals  REFERRED TO PCP OFFICE   Disagree/Comply: Comply

## 2016-06-14 NOTE — Progress Notes (Signed)
Subjective:    Patient ID: [name], female    DOB: 1930-12-15, 82 y.o.   MRN: [id number]  HPI  Here to f/u with daughter retired from [employer], with acute visit for elevated BP; pt does not usually take BP at home but has cuff; last evening had an uncomfortable night with some GI distress which seemed to resolve this am, but also had some pressure like feeling in the face so decided to check her BP; 206/106 and 184/102 this am, then later 148/62 later in the am.   Last labs on record from 2015, recent renal fxn unkown.  Pt denies chest pain, increased sob or doe, wheezing, orthopnea, PND, increased LE swelling, palpitations, dizziness or syncope.  Pt denies new neurological symptoms such as new headache, or facial or extremity weakness or numbness   Pt denies polydipsia, polyuria  Pt describes previous hx of transient elev BP as well.  Incidentally has cardiology appt tomorrow, new pt visit.  Pt not on ace/arb with hx of DM, and has not intolerance or allergy to these in past.   Denies worsening reflux, abd pain, dysphagia, n/v, bowel change or blood, after some ? Dyspeptic symptoms last PM.  Also sees Endo at [facility] (not on this EMR) with recent lab - A1c only in June 2017 at 8.3.   Pt denies polydipsia, polyuria, or low sugar symptoms such as weakness or confusion improved with po intake.  Pt states overall good compliance with meds, trying to follow lower cholesterol, diabetic diet, wt overall stable.  CBG > 250 this am, and has had investigation per endo for low sugars at night but proved neg.  BS over the past yr not well controlled, now on intensive therapy with newly started tid/ac fast acting insulin, and Lantus qhs.  Denies hyper or hypo thyroid symptoms such as voice, skin or hair change. No recent TSH on record and not addressed per endo per pt Past Medical History:  Diagnosis Date  . Allergy   . Asthma   . Atrial fibrillation (HCC)   . Diabetes mellitus   . GERD (gastroesophageal  reflux disease)   . Hyperlipidemia   . Osteopenia   . Sleep disorder   . Thyroid disease    Past Surgical History:  Procedure Laterality Date  . CERVICAL CONE BIOPSY  1960's  . CHOLECYSTECTOMY    . NASAL POLYP SURGERY  01/01  . STRABISMUS SURGERY  1946 / 5  . VAGINAL DELIVERY     x2    reports that she has never smoked. She has never used smokeless tobacco. She reports that she drinks alcohol. She reports that she does not use drugs. family history includes Asthma in her mother; Coronary artery disease in her father. Allergies  Allergen Reactions  . Cefdinir     REACTION: unspecified  . Ciprofloxacin     rash  . Clarithromycin     REACTION: weird dreams (on prednisone at the same time but has tolerated this in the past)  . Miglitol     REACTION: unspecified  . Niacin     REACTION: unspecified  . Qvar [Beclomethasone] Other (See Comments)    Feels funny Feels funny   Current Outpatient Prescriptions on File Prior to Visit  Medication Sig Dispense Refill  . ACCU-CHEK SMARTVIEW test strip TEST BLOOD SUGAR BID UTD  3  . albuterol (PROVENTIL HFA;VENTOLIN HFA) 108 (90 Base) MCG/ACT inhaler Inhale 2 puffs into the lungs every 6 (six) hours as needed.  18 g 0  . ALPRAZolam (XANAX) 0.25 MG tablet TAKE 1 TABLET BY MOUTH TWICE DAILY 100 tablet 0  . aspirin 81 MG tablet Take 81 mg by mouth daily.      Marland Kitchen atorvastatin (LIPITOR) 20 MG tablet TAKE 1 TABLET BY MOUTH EVERY DAY 90 tablet 1  . B-D ULTRA-FINE 33 LANCETS MISC Use to test blood sugar twice daily dx: E11.9 200 each 3  . calcitonin, salmon, (MIACALCIN/FORTICAL) 200 UNIT/ACT nasal spray USE 1 SPRAY IN THE NOSE EVERY DAY(ALTERNATING NOSTRILS) 3.7 mL 11  . CALCIUM-VITAMIN D PO Take 1 tablet by mouth daily.      . cetirizine (ZYRTEC) 1 MG/ML syrup Take 10 mg by mouth daily.    . Chromium 100 MCG TABS Take by mouth daily.      . Coenzyme Q10 200 MG capsule Take 200 mg by mouth daily.      Marland Kitchen EPINEPHrine 0.3 mg/0.3 mL IJ SOAJ  injection Inject 0.3 mg into the muscle once.    . fish oil-omega-3 fatty acids 1000 MG capsule Take 1 g by mouth daily.      . fluticasone (FLONASE) 50 MCG/ACT nasal spray Place 2 sprays into both nostrils daily.    . folic acid (FOLVITE) 1 MG tablet Take 1 mg by mouth daily.      . insulin aspart (NOVOLOG FLEXPEN) 100 UNIT/ML FlexPen Inject 6 Units into the skin 3 (three) times daily before meals.    . Insulin Glargine (LANTUS SOLOSTAR) 100 UNIT/ML Solostar Pen INJECT 20 UNITS UNDER THE SKIN EVERY MORNING AND 16 UNITS EVERY EVENING AS DIRECTED    . Insulin Pen Needle (B-D UF III MINI PEN NEEDLES) 31G X 5 MM MISC Use to inject insulin four times daily as directed dx: E11.9 200 each 3  . LANOXIN 250 MCG tablet TAKE 1/2 TABLET BY MOUTH EVERY DAY 45 tablet 0  . levothyroxine (SYNTHROID, LEVOTHROID) 100 MCG tablet Take 100 mcg by mouth daily before breakfast.    . loratadine (CLARITIN) 10 MG tablet Take 10 mg by mouth daily.    . meclizine (ANTIVERT) 25 MG tablet TAKE 1 TABLET BY MOUTH THREE TIMES DAILY AS NEEDED 90 tablet 0  . metFORMIN (GLUCOPHAGE) 1000 MG tablet Take 1,000 mg by mouth daily with breakfast.    . metoprolol succinate (TOPROL-XL) 25 MG 24 hr tablet Take 1 tablet (25 mg total) by mouth daily. 90 tablet 3  . montelukast (SINGULAIR) 10 MG tablet TAKE 1 TABLET BY MOUTH EVERY NIGHT AT BEDTIME 90 tablet 0  . Multiple Vitamin (MULTIVITAMIN) tablet Take 1 tablet by mouth daily.    Marland Kitchen omeprazole (PRILOSEC) 20 MG capsule TAKE 1 CAPSULE BY MOUTH TWICE DAILY BEFORE A MEAL 180 capsule 0  . vitamin B-12 (CYANOCOBALAMIN) 1000 MCG tablet Take 1,000 mcg by mouth daily.    . vitamin C (ASCORBIC ACID) 500 MG tablet Take 500 mg by mouth daily.     No current facility-administered medications on file prior to visit.     Review of Systems  Constitutional: Negative for unusual diaphoresis or night sweats HENT: Negative for ear swelling or discharge Eyes: Negative for worsening visual haziness    Respiratory: Negative for choking and stridor.   Gastrointestinal: Negative for distension or worsening eructation Genitourinary: Negative for retention or change in urine volume.  Musculoskeletal: Negative for other MSK pain or swelling Skin: Negative for color change and worsening wound Neurological: Negative for tremors and numbness other than noted  Psychiatric/Behavioral: Negative for decreased concentration  or agitation other than above       Objective:   Physical Exam BP (!) 142/82   Pulse 70   Temp 98.2 F (36.8 C) (Oral)   Resp 20   Wt 161 lb (73 kg)   SpO2 96%   BMI 28.98 kg/m  VS noted,  Constitutional: Pt appears in no apparent distress HENT: Head: NCAT.  Right Ear: External ear normal.  Left Ear: External ear normal.  Eyes: . Pupils are equal, round, and reactive to light. Conjunctivae and EOM are normal Neck: Normal range of motion. Neck supple.  Cardiovascular: Normal rate and regular rhythm.   Pulmonary/Chest: Effort normal and breath sounds without rales or wheezing.  Abd:  Soft, NT, ND, + BS Neurological: Pt is alert. Not confused , motor grossly intact Skin: Skin is warm. No rash, no LE edema Psychiatric: Pt behavior is normal. No agitation.     Assessment & Plan:

## 2016-06-14 NOTE — Assessment & Plan Note (Signed)
Somewhat labile for unclear reason, suspect current BP more c/w usual; for losartan 100 qd, but to start after baseline labs in AM, then f/u with cardiology for PAF, to see if this would be acceptable in conjunction with cardiology goals; then f/u with Dr Alphonsus Sias in 2 wks for bp f/u and recommend BMP to r/o worsening renal fxn on ARB

## 2016-06-15 ENCOUNTER — Encounter: Payer: Self-pay | Admitting: Cardiology

## 2016-06-15 ENCOUNTER — Ambulatory Visit (INDEPENDENT_AMBULATORY_CARE_PROVIDER_SITE_OTHER): Payer: Medicare PPO | Admitting: Cardiology

## 2016-06-15 VITALS — BP 140/60 | HR 71 | Ht 62.0 in | Wt 159.8 lb

## 2016-06-15 DIAGNOSIS — E785 Hyperlipidemia, unspecified: Secondary | ICD-10-CM | POA: Diagnosis not present

## 2016-06-15 DIAGNOSIS — I10 Essential (primary) hypertension: Secondary | ICD-10-CM | POA: Diagnosis not present

## 2016-06-15 DIAGNOSIS — I480 Paroxysmal atrial fibrillation: Secondary | ICD-10-CM | POA: Diagnosis not present

## 2016-06-15 MED ORDER — LOSARTAN POTASSIUM 25 MG PO TABS: 25.0000 mg | ORAL_TABLET | Freq: Every day | ORAL | 6 refills | Status: DC

## 2016-06-15 NOTE — Progress Notes (Signed)
Cardiology Office Note   Date:  06/15/2016   ID:  [name], DOB November 19, 1930, MRN [id number]  Referring Doctor:  Tillman Abide, MD   Cardiologist:   Almond Lint, MD   Reason for consultation:  Chief Complaint  Patient presents with  . Other    Ref by Dr. Alphonsus [name] for HTN and Paroxysmal a-fib. Meds reviewed by the patient verbally.       History of Present Illness: [name] is a 82 y.o. female who presents for Establishing cardiology care for history of paroxysmal atrial fibrillation.  Patient describes being diagnosed with paroxysmal atrial fibrillation approximately 23 years ago. She presented with significant palpitations. She was placed on metoprolol and digoxin. She has not seen a cardiologist for many years now. She recalls only having that one episode back that. She would have occasional fluttering in her chest but nothing as severe as that initial presentation.  She has no limitations with physical activity in terms of shortness of breath and chest pain. She can do shopping and grocery and time at least a flight of stairs without any difficulties.  In terms of hypertension, she presented to PCP office yesterday for elevated blood pressure it was noted to be the 200 systolic at home. Over at the office it was 140 systolic. There is a concern that her blood pressure monitor may not be working properly and she will be getting a new one. She was prescribed losartan 100 million units by mouth daily pending cardiology visit.  Asian denies fever, cough, colds, abdominal pain. No PND, orthopnea, edema.   ROS:  Please see the history of present illness. Aside from mentioned under HPI, all other systems are reviewed and negative.     Past Medical History:  Diagnosis Date  . Allergy   . Asthma   . Atrial fibrillation (HCC)   . Diabetes mellitus   . GERD (gastroesophageal reflux disease)   . Hyperlipidemia   . Osteopenia   . Sleep disorder   . Thyroid disease      Past Surgical History:  Procedure Laterality Date  . CERVICAL CONE BIOPSY  1960's  . CHOLECYSTECTOMY    . NASAL POLYP SURGERY  01/01  . STRABISMUS SURGERY  1946 / 35  . VAGINAL DELIVERY     x2     reports that she has never smoked. She has never used smokeless tobacco. She reports that she drinks alcohol. She reports that she does not use drugs.   family history includes Asthma in her mother; Coronary artery disease in her father.   Outpatient Medications Prior to Visit  Medication Sig Dispense Refill  . ACCU-CHEK SMARTVIEW test strip TEST BLOOD SUGAR BID UTD  3  . albuterol (PROVENTIL HFA;VENTOLIN HFA) 108 (90 Base) MCG/ACT inhaler Inhale 2 puffs into the lungs every 6 (six) hours as needed. 18 g 0  . ALPRAZolam (XANAX) 0.25 MG tablet TAKE 1 TABLET BY MOUTH TWICE DAILY 100 tablet 0  . aspirin 81 MG tablet Take 81 mg by mouth daily.      Marland Kitchen atorvastatin (LIPITOR) 20 MG tablet TAKE 1 TABLET BY MOUTH EVERY DAY 90 tablet 1  . B-D ULTRA-FINE 33 LANCETS MISC Use to test blood sugar twice daily dx: E11.9 200 each 3  . calcitonin, salmon, (MIACALCIN/FORTICAL) 200 UNIT/ACT nasal spray USE 1 SPRAY IN THE NOSE EVERY DAY(ALTERNATING NOSTRILS) 3.7 mL 11  . CALCIUM-VITAMIN D PO Take 1 tablet by mouth daily.      Marland Kitchen  cetirizine (ZYRTEC) 1 MG/ML syrup Take 10 mg by mouth daily.    . Chromium 100 MCG TABS Take by mouth daily.      . Coenzyme Q10 200 MG capsule Take 200 mg by mouth daily.      Marland Kitchen EPINEPHrine 0.3 mg/0.3 mL IJ SOAJ injection Inject 0.3 mg into the muscle once.    . fish oil-omega-3 fatty acids 1000 MG capsule Take 1 g by mouth daily.      . fluticasone (FLONASE) 50 MCG/ACT nasal spray Place 2 sprays into both nostrils daily.    . folic acid (FOLVITE) 1 MG tablet Take 1 mg by mouth daily.      . insulin aspart (NOVOLOG FLEXPEN) 100 UNIT/ML FlexPen Inject 6 Units into the skin 3 (three) times daily before meals.    . Insulin Glargine (LANTUS SOLOSTAR) 100 UNIT/ML Solostar Pen INJECT  20 UNITS UNDER THE SKIN EVERY MORNING AND 16 UNITS EVERY EVENING AS DIRECTED    . Insulin Pen Needle (B-D UF III MINI PEN NEEDLES) 31G X 5 MM MISC Use to inject insulin four times daily as directed dx: E11.9 200 each 3  . LANOXIN 250 MCG tablet TAKE 1/2 TABLET BY MOUTH EVERY DAY 45 tablet 0  . levothyroxine (SYNTHROID, LEVOTHROID) 100 MCG tablet Take 100 mcg by mouth daily before breakfast.    . loratadine (CLARITIN) 10 MG tablet Take 10 mg by mouth daily.    . meclizine (ANTIVERT) 25 MG tablet TAKE 1 TABLET BY MOUTH THREE TIMES DAILY AS NEEDED 90 tablet 0  . metFORMIN (GLUCOPHAGE) 1000 MG tablet Take 1,000 mg by mouth daily with breakfast.    . metoprolol succinate (TOPROL-XL) 25 MG 24 hr tablet Take 1 tablet (25 mg total) by mouth daily. 90 tablet 3  . montelukast (SINGULAIR) 10 MG tablet TAKE 1 TABLET BY MOUTH EVERY NIGHT AT BEDTIME 90 tablet 0  . Multiple Vitamin (MULTIVITAMIN) tablet Take 1 tablet by mouth daily.    Marland Kitchen omeprazole (PRILOSEC) 20 MG capsule TAKE 1 CAPSULE BY MOUTH TWICE DAILY BEFORE A MEAL 180 capsule 0  . vitamin B-12 (CYANOCOBALAMIN) 1000 MCG tablet Take 1,000 mcg by mouth daily.    . vitamin C (ASCORBIC ACID) 500 MG tablet Take 500 mg by mouth daily.    Marland Kitchen losartan (COZAAR) 100 MG tablet Take 1 tablet (100 mg total) by mouth daily. 90 tablet 3   No facility-administered medications prior to visit.      Allergies: Cefdinir; Ciprofloxacin; Clarithromycin; Miglitol; Niacin; and Qvar [beclomethasone]    PHYSICAL EXAM: VS:  BP 140/60 (BP Location: Left Arm, Patient Position: Sitting, Cuff Size: Normal)   Pulse 71   Ht 5\' 2"  (1.575 m)   Wt 159 lb 12 oz (72.5 kg)   BMI 29.22 kg/m  , Body mass index is 29.22 kg/m. Wt Readings from Last 3 Encounters:  06/15/16 159 lb 12 oz (72.5 kg)  06/14/16 161 lb (73 kg)  05/24/16 158 lb (71.7 kg)    GENERAL:  well developed, well nourished, , not in acute distress HEENT: normocephalic, pink conjunctivae, anicteric sclerae, no  xanthelasma, normal dentition, oropharynx clear NECK:  no neck vein engorgement, JVP normal, no hepatojugular reflux, carotid upstroke brisk and symmetric, no bruit, no thyromegaly, no lymphadenopathy LUNGS:  good respiratory effort, clear to auscultation bilaterally CV:  PMI not displaced, no thrills, no lifts, S1 and S2 within normal limits, no palpable S3 or S4, no murmurs, no rubs, no gallops ABD:  Soft, nontender, nondistended, normoactive  bowel sounds, no abdominal aortic bruit, no hepatomegaly, no splenomegaly MS: nontender back, no kyphosis, no scoliosis, no joint deformities EXT:  2+ DP/PT pulses, no edema, no varicosities, no cyanosis, no clubbing SKIN: warm, nondiaphoretic, normal turgor, no ulcers NEUROPSYCH: alert, oriented to person, place, and time, sensory/motor grossly intact, normal mood, appropriate affect  Recent Labs: No results found for requested labs within last 8760 hours.   Lipid Panel    Component Value Date/Time   CHOL 102 05/04/2015 1314   TRIG 140.0 05/04/2015 1314   HDL 41.30 05/04/2015 1314   CHOLHDL 2 05/04/2015 1314   VLDL 28.0 05/04/2015 1314   LDLCALC 33 05/04/2015 1314   LDLDIRECT 55.5 03/19/2012 1620     Other studies Reviewed:  EKG:  The ekg from 06/15/2016 was personally reviewed by me and it revealed sinus rhythm, PACs, 71 BPM. Incomplete right bundle-branch block.  Additional studies/ records that were reviewed personally reviewed by me today include: None available   ASSESSMENT AND PLAN: Patient carries a diagnosis of paroxysmal atrial fibrillation Currently in sinus rhythm with PACs Ventricular rate is controlled on metoprolol. Patient is on digoxin that was started many years ago. Recommend cardiac event monitor for further evaluation.  Recommend echocardiogram. Eventually we will try to wean off digoxin. It is unclear whether she's had anymore episodes of atrial fibrillation. If event monitor picks up evidence of A. Fib, oral  anticoagulation will be recommended with her chadsvasc 4.  Hypertension Blood pressure within normal limits today Recommend starting losartan 25 mg once a day, mainly for indication for being diabetic. Labs ordered from PCP office yesterday will be reordered so that patient can have them done here in [place].  Hyperlipidemia LDL goal is less than 70 due to diagnosis of diabetes. Patient is to see endocrinologist on follow-up tomorrow.  Current medicines are reviewed at length with the patient today.  The patient does not have concerns regarding medicines.  Labs/ tests ordered today include:  Orders Placed This Encounter  Procedures  . Basic Metabolic Panel (BMET)  . CBC with Differential/Platelet  . Hepatic function panel  . Lipid Profile  . TSH  . Cardiac event monitor  . EKG 12-Lead  . ECHOCARDIOGRAM COMPLETE    I had a lengthy and detailed discussion with the patient regarding diagnoses, prognosis, diagnostic options, treatment options , and side effects of medications.   I counseled the patient on importance of lifestyle modification including heart healthy diet, regular physical activity   Disposition:   FU with undersigned after tests   I spent at least 60 minutes with the patient today and more than 50% of the time was spent counseling the patient and coordinating care.   Signed, Almond Lint, MD  06/15/2016 12:45 PM    [facility]  This note was generated in part with voice recognition software and I apologize for any typographical errors that were not detected and corrected.

## 2016-06-15 NOTE — Patient Instructions (Addendum)
Medication Instructions:  Your physician has recommended you make the following change in your medication:  1. Losartan 25 mg Once daily   Labwork: Your physician recommends that you return for lab work in: 1 week for BMP, CBC, Hepatic, Lipid, and TSH.   Testing/Procedures: Your physician has requested that you have an echocardiogram. Echocardiography is a painless test that uses sound waves to create images of your heart. It provides your doctor with information about the size and shape of your heart and how well your heart's chambers and valves are working. This procedure takes approximately one hour. There are no restrictions for this procedure.  Your physician has recommended that you wear an event monitor. Event monitors are medical devices that record the heart's electrical activity. Doctors most often [place] these monitors to diagnose arrhythmias. Arrhythmias are problems with the speed or rhythm of the heartbeat. The monitor is a small, portable device. You can wear one while you do your normal daily activities. This is usually used to diagnose what is causing palpitations/syncope (passing out).    Follow-Up: Your physician recommends that you schedule a follow-up appointment after testing and 30 day event monitor to see Dr. Alvino Chapel.  It was a pleasure seeing you today here in the office. Please do not hesitate to give [place] a call back if you have any further questions. [phone]  Euless Cellar RN, BSN      Any Other Special Instructions Will Be Listed Below (If Applicable).     If you need a refill on your cardiac medications before your next appointment, please call your pharmacy.  Echocardiogram An echocardiogram, or echocardiography, uses sound waves (ultrasound) to produce an image of your heart. The echocardiogram is simple, painless, obtained within a short period of time, and offers valuable information to your health care provider. The images from an echocardiogram can  provide information such as:  Evidence of coronary artery disease (CAD).  Heart size.  Heart muscle function.  Heart valve function.  Aneurysm detection.  Evidence of a past heart attack.  Fluid buildup around the heart.  Heart muscle thickening.  Assess heart valve function. LET [facility] CARE PROVIDER KNOW ABOUT:  Any allergies you have.  All medicines you are taking, including vitamins, herbs, eye drops, creams, and over-the-counter medicines.  Previous problems you or members of your family have had with the use of anesthetics.  Any blood disorders you have.  Previous surgeries you have had.  Medical conditions you have.  Possibility of pregnancy, if this applies. BEFORE THE PROCEDURE  No special preparation is needed. Eat and drink normally.  PROCEDURE   In order to produce an image of your heart, gel will be applied to your chest and a wand-like tool (transducer) will be moved over your chest. The gel will help transmit the sound waves from the transducer. The sound waves will harmlessly bounce off your heart to allow the heart images to be captured in real-time motion. These images will then be recorded.  You may need an IV to receive a medicine that improves the quality of the pictures. AFTER THE PROCEDURE You may return to your normal schedule including diet, activities, and medicines, unless your health care provider tells you otherwise.   This information is not intended to replace advice given to you by your health care provider. Make sure you discuss any questions you have with your health care provider.   Document Released: 10/28/2000 Document Revised: 11/21/2014 Document Reviewed: 07/08/2013 Elsevier Interactive Patient Education  2016 [employer] Inc[employer]   Cardiac Event Monitoring A cardiac event monitor is a small recording device used to help detect abnormal heart rhythms (arrhythmias). The monitor is used to record heart rhythm when noticeable  symptoms such as the following occur:  Fast heartbeats (palpitations), such as heart racing or fluttering.  Dizziness.  Fainting or light-headedness.  Unexplained weakness. The monitor is wired to two electrodes placed on your chest. Electrodes are flat, sticky disks that attach to your skin. The monitor can be worn for up to 30 days. You will wear the monitor at all times, except when bathing.  HOW TO USE YOUR CARDIAC EVENT MONITOR A technician will prepare your chest for the electrode placement. The technician will show you how to place the electrodes, how to work the monitor, and how to replace the batteries. Take time to practice using the monitor before you leave the office. Make sure you understand how to send the information from the monitor to your health care provider. This requires a telephone with a landline, not a cell phone. You need to:  Wear your monitor at all times, except when you are in water:  Do not get the monitor wet.  Take the monitor off when bathing. Do not swim or use a hot tub with it on.  Keep your skin clean. Do not put body lotion or moisturizer on your chest.  Change the electrodes daily or any time they stop sticking to your skin. You might need to use tape to keep them on.  It is possible that your skin under the electrodes could become irritated. To keep this from happening, try to put the electrodes in slightly different places on your chest. However, they must remain in the area under your left breast and in the upper right section of your chest.  Make sure the monitor is safely clipped to your clothing or in a location close to your body that your health care provider recommends.  Press the button to record when you feel symptoms of heart trouble, such as dizziness, weakness, light-headedness, palpitations, thumping, shortness of breath, unexplained weakness, or a fluttering or racing heart. The monitor is always on and records what happened slightly  before you pressed the button, so do not worry about being too late to get good information.  Keep a diary of your activities, such as walking, doing chores, and taking medicine. It is especially important to note what you were doing when you pushed the button to record your symptoms. This will help your health care provider determine what might be contributing to your symptoms. The information stored in your monitor will be reviewed by your health care provider alongside your diary entries.  Send the recorded information as recommended by your health care provider. It is important to understand that it will take some time for your health care provider to process the results.  Change the batteries as recommended by your health care provider. SEEK IMMEDIATE MEDICAL CARE IF:   You have chest pain.  You have extreme difficulty breathing or shortness of breath.  You develop a very fast heartbeat that persists.  You develop dizziness that does not go away.  You faint or constantly feel you are about to faint.   This information is not intended to replace advice given to you by your health care provider. Make sure you discuss any questions you have with your health care provider.   Document Released: 08/09/2008 Document Revised: 11/21/2014 Document Reviewed: 04/29/2013 Elsevier Interactive  Patient Education 2016 [employer].

## 2016-06-16 DIAGNOSIS — E039 Hypothyroidism, unspecified: Secondary | ICD-10-CM | POA: Diagnosis not present

## 2016-06-16 DIAGNOSIS — E538 Deficiency of other specified B group vitamins: Secondary | ICD-10-CM | POA: Diagnosis not present

## 2016-06-16 DIAGNOSIS — E1165 Type 2 diabetes mellitus with hyperglycemia: Secondary | ICD-10-CM | POA: Diagnosis not present

## 2016-06-16 DIAGNOSIS — Z794 Long term (current) use of insulin: Secondary | ICD-10-CM | POA: Diagnosis not present

## 2016-06-23 ENCOUNTER — Other Ambulatory Visit: Payer: Self-pay | Admitting: *Deleted

## 2016-06-23 ENCOUNTER — Other Ambulatory Visit (INDEPENDENT_AMBULATORY_CARE_PROVIDER_SITE_OTHER): Payer: Medicare PPO | Admitting: *Deleted

## 2016-06-23 ENCOUNTER — Other Ambulatory Visit: Payer: Medicare PPO

## 2016-06-23 DIAGNOSIS — I4891 Unspecified atrial fibrillation: Secondary | ICD-10-CM

## 2016-06-23 DIAGNOSIS — I10 Essential (primary) hypertension: Secondary | ICD-10-CM

## 2016-06-23 DIAGNOSIS — I480 Paroxysmal atrial fibrillation: Secondary | ICD-10-CM

## 2016-06-24 LAB — LIPID PANEL
Chol/HDL Ratio: 2.3 ratio units (ref 0.0–4.4)
Cholesterol, Total: 100 mg/dL (ref 100–199)
HDL: 44 mg/dL (ref >39)
LDL Calculated: 18 mg/dL (ref 0–99)
Triglycerides: 190 mg/dL — ABNORMAL HIGH (ref 0–149)
VLDL Cholesterol Cal: 38 mg/dL (ref 5–40)

## 2016-06-24 LAB — HEPATIC FUNCTION PANEL
ALK PHOS: 90 IU/L (ref 39–117)
ALT: 27 IU/L (ref 0–32)
AST: 21 IU/L (ref 0–40)
Albumin: 4.1 g/dL (ref 3.5–4.7)
BILIRUBIN, DIRECT: 0.17 mg/dL (ref 0.00–0.40)
Bilirubin Total: 0.5 mg/dL (ref 0.0–1.2)
TOTAL PROTEIN: 6.6 g/dL (ref 6.0–8.5)

## 2016-06-24 LAB — CBC WITH DIFFERENTIAL/PLATELET
BASOS: 0 %
Basophils Absolute: 0 10*3/uL (ref 0.0–0.2)
EOS (ABSOLUTE): 0.4 10*3/uL (ref 0.0–0.4)
EOS: 6 %
HEMATOCRIT: 37.8 % (ref 34.0–46.6)
Hemoglobin: 12.7 g/dL (ref 11.1–15.9)
Immature Grans (Abs): 0 10*3/uL (ref 0.0–0.1)
Immature Granulocytes: 0 %
LYMPHS ABS: 2.4 10*3/uL (ref 0.7–3.1)
Lymphs: 33 %
MCH: 30 pg (ref 26.6–33.0)
MCHC: 33.6 g/dL (ref 31.5–35.7)
MCV: 89 fL (ref 79–97)
MONOS ABS: 0.7 10*3/uL (ref 0.1–0.9)
Monocytes: 9 %
NEUTROS ABS: 3.8 10*3/uL (ref 1.4–7.0)
Neutrophils: 52 %
Platelets: 220 10*3/uL (ref 150–379)
RBC: 4.24 x10E6/uL (ref 3.77–5.28)
RDW: 13.3 % (ref 12.3–15.4)
WBC: 7.4 10*3/uL (ref 3.4–10.8)

## 2016-06-24 LAB — BASIC METABOLIC PANEL
BUN / CREAT RATIO: 22 (ref 12–28)
BUN: 16 mg/dL (ref 8–27)
CO2: 25 mmol/L (ref 18–29)
CREATININE: 0.74 mg/dL (ref 0.57–1.00)
Calcium: 9.9 mg/dL (ref 8.7–10.3)
Chloride: 97 mmol/L (ref 96–106)
GFR, EST AFRICAN AMERICAN: 87 mL/min/{1.73_m2} (ref >59)
GFR, EST NON AFRICAN AMERICAN: 75 mL/min/{1.73_m2} (ref >59)
GLUCOSE: 209 mg/dL — AB (ref 65–99)
Potassium: 4.8 mmol/L (ref 3.5–5.2)
SODIUM: 138 mmol/L (ref 134–144)

## 2016-06-24 LAB — TSH: TSH: 3.32 u[IU]/mL (ref 0.450–4.500)

## 2016-06-29 DIAGNOSIS — H1032 Unspecified acute conjunctivitis, left eye: Secondary | ICD-10-CM | POA: Diagnosis not present

## 2016-07-01 ENCOUNTER — Other Ambulatory Visit: Payer: Self-pay

## 2016-07-01 ENCOUNTER — Ambulatory Visit (INDEPENDENT_AMBULATORY_CARE_PROVIDER_SITE_OTHER): Payer: Medicare PPO

## 2016-07-01 DIAGNOSIS — I480 Paroxysmal atrial fibrillation: Secondary | ICD-10-CM | POA: Diagnosis not present

## 2016-07-01 DIAGNOSIS — I4891 Unspecified atrial fibrillation: Secondary | ICD-10-CM | POA: Diagnosis not present

## 2016-07-01 DIAGNOSIS — I10 Essential (primary) hypertension: Secondary | ICD-10-CM

## 2016-07-12 DIAGNOSIS — E538 Deficiency of other specified B group vitamins: Secondary | ICD-10-CM | POA: Diagnosis not present

## 2016-07-12 DIAGNOSIS — E1165 Type 2 diabetes mellitus with hyperglycemia: Secondary | ICD-10-CM | POA: Diagnosis not present

## 2016-07-12 DIAGNOSIS — E039 Hypothyroidism, unspecified: Secondary | ICD-10-CM | POA: Diagnosis not present

## 2016-07-12 DIAGNOSIS — Z794 Long term (current) use of insulin: Secondary | ICD-10-CM | POA: Diagnosis not present

## 2016-07-17 ENCOUNTER — Emergency Department: Payer: Medicare PPO

## 2016-07-17 ENCOUNTER — Encounter: Payer: Self-pay | Admitting: Emergency Medicine

## 2016-07-17 ENCOUNTER — Emergency Department
Admission: EM | Admit: 2016-07-17 | Discharge: 2016-07-17 | Disposition: A | Discharge status: Home / Self Care | Payer: Medicare PPO | Attending: Student in an Organized Health Care Education/Training Program | Admitting: Student in an Organized Health Care Education/Training Program

## 2016-07-17 DIAGNOSIS — J45909 Unspecified asthma, uncomplicated: Secondary | ICD-10-CM | POA: Insufficient documentation

## 2016-07-17 DIAGNOSIS — Z7982 Long term (current) use of aspirin: Secondary | ICD-10-CM | POA: Diagnosis not present

## 2016-07-17 DIAGNOSIS — E119 Type 2 diabetes mellitus without complications: Secondary | ICD-10-CM | POA: Insufficient documentation

## 2016-07-17 DIAGNOSIS — E039 Hypothyroidism, unspecified: Secondary | ICD-10-CM | POA: Diagnosis not present

## 2016-07-17 DIAGNOSIS — Z794 Long term (current) use of insulin: Secondary | ICD-10-CM | POA: Diagnosis not present

## 2016-07-17 DIAGNOSIS — Z79899 Other long term (current) drug therapy: Secondary | ICD-10-CM | POA: Insufficient documentation

## 2016-07-17 DIAGNOSIS — I10 Essential (primary) hypertension: Secondary | ICD-10-CM | POA: Insufficient documentation

## 2016-07-17 DIAGNOSIS — Z7984 Long term (current) use of oral hypoglycemic drugs: Secondary | ICD-10-CM | POA: Insufficient documentation

## 2016-07-17 DIAGNOSIS — I4891 Unspecified atrial fibrillation: Secondary | ICD-10-CM | POA: Diagnosis not present

## 2016-07-17 DIAGNOSIS — R002 Palpitations: Secondary | ICD-10-CM

## 2016-07-17 DIAGNOSIS — R079 Chest pain, unspecified: Secondary | ICD-10-CM | POA: Diagnosis not present

## 2016-07-17 LAB — BASIC METABOLIC PANEL
Anion gap: 8 (ref 5–15)
BUN: 19 mg/dL (ref 6–20)
CALCIUM: 9 mg/dL (ref 8.9–10.3)
CO2: 27 mmol/L (ref 22–32)
CREATININE: 0.61 mg/dL (ref 0.44–1.00)
Chloride: 102 mmol/L (ref 101–111)
GFR calc Af Amer: >60 mL/min (ref >60)
GFR calc non Af Amer: >60 mL/min (ref >60)
GLUCOSE: 195 mg/dL — AB (ref 65–99)
Potassium: 4.2 mmol/L (ref 3.5–5.1)
Sodium: 137 mmol/L (ref 135–145)

## 2016-07-17 LAB — MAGNESIUM: Magnesium: 1.6 mg/dL — ABNORMAL LOW (ref 1.7–2.4)

## 2016-07-17 LAB — CBC
HCT: 38 % (ref 35.0–47.0)
Hemoglobin: 13.4 g/dL (ref 12.0–16.0)
MCH: 30.9 pg (ref 26.0–34.0)
MCHC: 35.3 g/dL (ref 32.0–36.0)
MCV: 87.3 fL (ref 80.0–100.0)
PLATELETS: 204 10*3/uL (ref 150–440)
RBC: 4.35 MIL/uL (ref 3.80–5.20)
RDW: 13 % (ref 11.5–14.5)
WBC: 7.5 10*3/uL (ref 3.6–11.0)

## 2016-07-17 LAB — TROPONIN I: Troponin I: <0.03 ng/mL (ref <0.03)

## 2016-07-17 MED ORDER — MAGNESIUM SULFATE 2 GM/50ML IV SOLN
2.0000 g | Freq: Once | INTRAVENOUS | Status: AC
  Administered 2016-07-17: 2 g via INTRAVENOUS
  Filled 2016-07-17: qty 50

## 2016-07-17 NOTE — ED Provider Notes (Signed)
[facility] Emergency Department Provider Note    First MD Initiated Contact with Patient 07/17/16 1151     (approximate)  I have reviewed the triage vital signs and the nursing notes.   HISTORY  Chief Complaint Chest Pain    HPI [name] is a 82 y.o. female presents with a brief episode of palpitations this morning. Patient states that she first felt the roughly around 5:30 this morning. She took some of her home heart medicines and the symptoms resolved. States that symptoms lasted roughly 15-20 minutes. She denies any chest pain currently or shortness of breath. She currently has a loop recorder due to frequent episodes of palpitations. Does have a history of A. fib but none documented recently. Denies any fevers or shortness of breath   Past Medical History:  Diagnosis Date  . Allergy   . Asthma   . Atrial fibrillation (HCC)   . Diabetes mellitus   . GERD (gastroesophageal reflux disease)   . Hyperlipidemia   . Osteopenia   . Sleep disorder   . Thyroid disease     Patient Active Problem List   Diagnosis Date Noted  . Cough 07/13/2015  . Mild intermittent asthma with exacerbation 09/08/2014  . Asthma, chronic 10/15/2013  . Abnormal CXR (chest x-ray) 10/15/2013  . Routine general medical examination at a health care facility 03/19/2012  . Diabetes mellitus type 2, uncontrolled, without complications (HCC) 01/16/2012  . Episodic mood disorder (HCC) 07/26/2010  . BACK PAIN, LUMBAR, WITH RADICULOPATHY 06/30/2009  . SLEEP DISORDER 11/12/2008  . Actinic keratosis 06/18/2008  . Essential hypertension 02/06/2008  . Hypothyroidism 01/12/2007  . Hyperlipemia 01/12/2007  . ATRIAL FIBRILLATION 01/12/2007  . Allergic rhinitis 01/12/2007  . GERD 01/12/2007  . OSTEOPENIA 01/12/2007    Past Surgical History:  Procedure Laterality Date  . CERVICAL CONE BIOPSY  1960's  . CHOLECYSTECTOMY    . NASAL POLYP SURGERY  01/01  . STRABISMUS SURGERY   1946 / 39  . VAGINAL DELIVERY     x2    Prior to Admission medications   Medication Sig Start Date End Date Taking? Authorizing Provider  ACCU-CHEK SMARTVIEW test strip TEST BLOOD SUGAR BID UTD 03/18/15   Historical Provider, MD  albuterol (PROVENTIL HFA;VENTOLIN HFA) 108 (90 Base) MCG/ACT inhaler Inhale 2 puffs into the lungs every 6 (six) hours as needed. 03/01/16   [name], MD  ALPRAZolam Prudy Feeler) 0.25 MG tablet TAKE 1 TABLET BY MOUTH TWICE DAILY 12/07/15   [name], MD  aspirin 81 MG tablet Take 81 mg by mouth daily.      Historical Provider, MD  atorvastatin (LIPITOR) 20 MG tablet TAKE 1 TABLET BY MOUTH EVERY DAY 06/09/16   [name], MD  B-D ULTRA-FINE 33 LANCETS MISC Use to test blood sugar twice daily dx: E11.9 11/17/14   [name], MD  calcitonin, salmon, (MIACALCIN/FORTICAL) 200 UNIT/ACT nasal spray USE 1 SPRAY IN THE NOSE EVERY DAY(ALTERNATING NOSTRILS) 12/07/15   [name], MD  CALCIUM-VITAMIN D PO Take 1 tablet by mouth daily.      Historical Provider, MD  cetirizine (ZYRTEC) 1 MG/ML syrup Take 10 mg by mouth daily.    Historical Provider, MD  Chromium 100 MCG TABS Take by mouth daily.      Historical Provider, MD  Coenzyme Q10 200 MG capsule Take 200 mg by mouth daily.      [name], MD  EPINEPHrine 0.3 mg/0.3 mL IJ SOAJ  injection Inject 0.3 mg into the muscle once.    Historical Provider, MD  fish oil-omega-3 fatty acids 1000 MG capsule Take 1 g by mouth daily.      Historical Provider, MD  fluticasone (FLONASE) 50 MCG/ACT nasal spray Place 2 sprays into both nostrils daily.    Historical Provider, MD  folic acid (FOLVITE) 1 MG tablet Take 1 mg by mouth daily.      Historical Provider, MD  insulin aspart (NOVOLOG FLEXPEN) 100 UNIT/ML FlexPen Inject 6 Units into the skin 3 (three) times daily before meals. 04/28/16 04/28/17  Historical Provider, MD  Insulin Glargine (LANTUS SOLOSTAR) 100 UNIT/ML Solostar Pen INJECT 20 UNITS UNDER THE SKIN  EVERY MORNING AND 16 UNITS EVERY EVENING AS DIRECTED 04/30/15   Historical Provider, MD  Insulin Pen Needle (B-D UF III MINI PEN NEEDLES) 31G X 5 MM MISC Use to inject insulin four times daily as directed dx: E11.9 05/27/16   Karie Schwalbeichard I Letvak, MD  LANOXIN 250 MCG tablet TAKE 1/2 TABLET BY MOUTH EVERY DAY 05/03/16   Karie Schwalbeichard I Letvak, MD  levothyroxine (SYNTHROID, LEVOTHROID) 100 MCG tablet Take 100 mcg by mouth daily before breakfast.    Historical Provider, MD  loratadine (CLARITIN) 10 MG tablet Take 10 mg by mouth daily.    Historical Provider, MD  losartan (COZAAR) 25 MG tablet Take 1 tablet (25 mg total) by mouth daily. 06/15/16 09/13/16  Almond LintAileen Ingal, MD  meclizine (ANTIVERT) 25 MG tablet TAKE 1 TABLET BY MOUTH THREE TIMES DAILY AS NEEDED 04/25/16   Karie Schwalbeichard I Letvak, MD  metFORMIN (GLUCOPHAGE) 1000 MG tablet Take 1,000 mg by mouth daily with breakfast.    Historical Provider, MD  metoprolol succinate (TOPROL-XL) 25 MG 24 hr tablet Take 1 tablet (25 mg total) by mouth daily. 05/24/16   Karie Schwalbeichard I Letvak, MD  montelukast (SINGULAIR) 10 MG tablet TAKE 1 TABLET BY MOUTH EVERY NIGHT AT BEDTIME 01/11/16   Karie Schwalbeichard I Letvak, MD  Multiple Vitamin (MULTIVITAMIN) tablet Take 1 tablet by mouth daily.    Historical Provider, MD  omeprazole (PRILOSEC) 20 MG capsule TAKE 1 CAPSULE BY MOUTH TWICE DAILY BEFORE A MEAL 04/25/16   Karie Schwalbeichard I Letvak, MD  vitamin B-12 (CYANOCOBALAMIN) 1000 MCG tablet Take 1,000 mcg by mouth daily.    Historical Provider, MD  vitamin C (ASCORBIC ACID) 500 MG tablet Take 500 mg by mouth daily.    Historical Provider, MD    Allergies Cefdinir; Ciprofloxacin; Clarithromycin; Miglitol; Niacin; and Qvar [beclomethasone]  Family History  Problem Relation Age of Onset  . Asthma Mother   . Coronary artery disease Father     Social History Social History  Substance Use Topics  . Smoking status: Never Smoker  . Smokeless tobacco: Never Used  . Alcohol use 0.0 oz/week     Comment: 1 glass of  wine per day    Review of Systems Patient denies headaches, rhinorrhea, blurry vision, numbness, shortness of breath, chest pain, edema, cough, abdominal pain, nausea, vomiting, diarrhea, dysuria, fevers, rashes or hallucinations unless otherwise stated above in HPI. ____________________________________________   PHYSICAL EXAM:  VITAL SIGNS: Vitals:   07/17/16 1300 07/17/16 1330  BP: (!) 181/59 (!) 195/62  Pulse: (!) 58 (!) 57  Resp: 17 (!) 21  Temp:      Constitutional: Alert and oriented. Well appearing and in no acute distress. Eyes: Conjunctivae are normal. PERRL. EOMI. Head: Atraumatic. Nose: No congestion/rhinnorhea. Mouth/Throat: Mucous membranes are moist.  Oropharynx non-erythematous. Neck: No stridor. Painless ROM. No  cervical spine tenderness to palpation Hematological/Lymphatic/Immunilogical: No cervical lymphadenopathy. Cardiovascular: Normal rate, regular rhythm. Grossly normal heart sounds.  Good peripheral circulation. Respiratory: Normal respiratory effort.  No retractions. Lungs CTAB. Gastrointestinal: Soft and nontender. No distention. No abdominal bruits. No CVA tenderness. Genitourinary:  Musculoskeletal: No lower extremity tenderness nor edema.  No joint effusions. Neurologic:  Normal speech and language. No gross focal neurologic deficits are appreciated. No gait instability. Skin:  Skin is warm, dry and intact. No rash noted. Psychiatric: Mood and affect are normal. Speech and behavior are normal.  ____________________________________________   LABS (all labs ordered are listed, but only abnormal results are displayed)  Results for orders placed or performed during the hospital encounter of 07/17/16 (from the past 24 hour(s))  Basic metabolic panel     Status: Abnormal   Collection Time: 07/17/16 10:50 AM  Result Value Ref Range   Sodium 137 135 - 145 mmol/L   Potassium 4.2 3.5 - 5.1 mmol/L   Chloride 102 101 - 111 mmol/L   CO2 27 22 - 32 mmol/L     Glucose, Bld 195 (H) 65 - 99 mg/dL   BUN 19 6 - 20 mg/dL   Creatinine, Ser 2.13 0.44 - 1.00 mg/dL   Calcium 9.0 8.9 - 08.6 mg/dL   GFR calc non Af Amer >60 >60 mL/min   GFR calc Af Amer >60 >60 mL/min   Anion gap 8 5 - 15  CBC     Status: None   Collection Time: 07/17/16 10:50 AM  Result Value Ref Range   WBC 7.5 3.6 - 11.0 K/uL   RBC 4.35 3.80 - 5.20 MIL/uL   Hemoglobin 13.4 12.0 - 16.0 g/dL   HCT 57.8 46.9 - 62.9 %   MCV 87.3 80.0 - 100.0 fL   MCH 30.9 26.0 - 34.0 pg   MCHC 35.3 32.0 - 36.0 g/dL   RDW 52.8 41.3 - 24.4 %   Platelets 204 150 - 440 K/uL  Troponin I     Status: None   Collection Time: 07/17/16 10:50 AM  Result Value Ref Range   Troponin I <0.03 <0.03 ng/mL  Magnesium     Status: Abnormal   Collection Time: 07/17/16 10:50 AM  Result Value Ref Range   Magnesium 1.6 (L) 1.7 - 2.4 mg/dL  Troponin I     Status: None   Collection Time: 07/17/16  2:24 PM  Result Value Ref Range   Troponin I <0.03 <0.03 ng/mL   ____________________________________________  EKG My review and personal interpretation at Time: 10:45   Indication: palpitations  Rate: 60  Rhythm: sinus Axis: normal Other: normal ____________________________________________  RADIOLOGY  CXR my read shows no evidence of acute cardiopulmonary process.  ____________________________________________   PROCEDURES  Procedure(s) performed: none    Critical Care performed: no ____________________________________________   INITIAL IMPRESSION / ASSESSMENT AND PLAN / ED COURSE  Pertinent labs & imaging results that were available during my care of the patient were reviewed by me and considered in my medical decision making (see chart for details).  DDX: Dysrhythmia, ACS, dehydration, pericarditis, COPD  [name] is a 82 y.o. who presents to the ED with symmetric palpitations. Arrives afebrile and asymptomatic. EKG shows no evidence of dysrhythmia or preexcitation syndrome. Not consistent  with acute congestive heart failure or pneumonia. Not clinically consistent with pericarditis. Based on history I am suspicious for brief symptomatically palpitations.  The patient will be placed on continuous pulse oximetry and telemetry for monitoring.  Laboratory  evaluation will be sent to evaluate for the above complaints.     Clinical Course  Comment By Time  Patient reassessed and remains symptomatic. She's not been kept on the monitor for 4 hours without any recurrent chest pain or tachycardia. She does have evidence of hypomagnesemia which is likely causing her dysrhythmias. We will put replete with IV magnesium. Patient otherwise stable for outpatient follow-up.  Have discussed with the patient and available family all diagnostics and treatments performed thus far and all questions were answered to the best of my ability. The patient demonstrates understanding and agreement with plan.  Willy Eddy, MD 09/03 1517     ____________________________________________   FINAL CLINICAL IMPRESSION(S) / ED DIAGNOSES  Final diagnoses:  Palpitations  Hypomagnesemia      NEW MEDICATIONS STARTED DURING THIS VISIT:  New Prescriptions   No medications on file     Note:  This document was prepared using Dragon voice recognition software and may include unintentional dictation errors.    Willy Eddy, MD 07/17/16 [phone]

## 2016-07-17 NOTE — ED Notes (Signed)
Awoke feeling like she was having an irregular heartbeat. Took lopressor at 5am, went back to sleep and awoke at 9 with same feeling. States feeling pressure in her back

## 2016-07-17 NOTE — ED Notes (Signed)
Pt took bp cuff off - states it hurts and is refusing bp measurements.

## 2016-07-17 NOTE — ED Triage Notes (Signed)
Pt reports she started having chest pain this morning in the epigastric region. PT states she has a hx of reflux also.  Pt states she ECHO a week ago.  Pt currently wearing heart monitor for the past 2 weeks and will wear until 9/16. Pt see's Dr. Ernestine McmurrayIngle for cardiology

## 2016-07-19 ENCOUNTER — Telehealth: Payer: Self-pay

## 2016-07-19 NOTE — Progress Notes (Deleted)
Cardiology Office Note   Date:  07/19/2016   ID:  [name], DOB 1931/07/30, MRN [id number]  Referring Doctor:  Tillman Abide, MD   Cardiologist:   Almond Lint, MD   Reason for consultation:  No chief complaint on file.     History of Present Illness: [name] is a 82 y.o. female who presents for Establishing cardiology care for history of paroxysmal atrial fibrillation.  Patient describes being diagnosed with paroxysmal atrial fibrillation approximately 23 years ago. She presented with significant palpitations. She was placed on metoprolol and digoxin. She has not seen a cardiologist for many years now. She recalls only having that one episode back that. She would have occasional fluttering in her chest but nothing as severe as that initial presentation.  She has no limitations with physical activity in terms of shortness of breath and chest pain. She can do shopping and grocery and time at least a flight of stairs without any difficulties.  In terms of hypertension, she presented to PCP office yesterday for elevated blood pressure it was noted to be the 200 systolic at home. Over at the office it was 140 systolic. There is a concern that her blood pressure monitor may not be working properly and she will be getting a new one. She was prescribed losartan 100 million units by mouth daily pending cardiology visit.  Asian denies fever, cough, colds, abdominal pain. No PND, orthopnea, edema.   ROS:  Please see the history of present illness. Aside from mentioned under HPI, all other systems are reviewed and negative.     Past Medical History:  Diagnosis Date  . Allergy   . Asthma   . Atrial fibrillation (HCC)   . Diabetes mellitus   . GERD (gastroesophageal reflux disease)   . Hyperlipidemia   . Osteopenia   . Sleep disorder   . Thyroid disease     Past Surgical History:  Procedure Laterality Date  . CERVICAL CONE BIOPSY  1960's  . CHOLECYSTECTOMY    .  NASAL POLYP SURGERY  01/01  . STRABISMUS SURGERY  1946 / 70  . VAGINAL DELIVERY     x2     reports that she has never smoked. She has never used smokeless tobacco. She reports that she drinks alcohol. She reports that she does not use drugs.   family history includes Asthma in her mother; Coronary artery disease in her father.   Outpatient Medications Prior to Visit  Medication Sig Dispense Refill  . ACCU-CHEK SMARTVIEW test strip TEST BLOOD SUGAR BID UTD  3  . albuterol (PROVENTIL HFA;VENTOLIN HFA) 108 (90 Base) MCG/ACT inhaler Inhale 2 puffs into the lungs every 6 (six) hours as needed. 18 g 0  . ALPRAZolam (XANAX) 0.25 MG tablet TAKE 1 TABLET BY MOUTH TWICE DAILY 100 tablet 0  . aspirin 81 MG tablet Take 81 mg by mouth daily.      Marland Kitchen atorvastatin (LIPITOR) 20 MG tablet TAKE 1 TABLET BY MOUTH EVERY DAY 90 tablet 1  . B-D ULTRA-FINE 33 LANCETS MISC Use to test blood sugar twice daily dx: E11.9 200 each 3  . calcitonin, salmon, (MIACALCIN/FORTICAL) 200 UNIT/ACT nasal spray USE 1 SPRAY IN THE NOSE EVERY DAY(ALTERNATING NOSTRILS) 3.7 mL 11  . CALCIUM-VITAMIN D PO Take 1 tablet by mouth daily.      . cetirizine (ZYRTEC) 1 MG/ML syrup Take 10 mg by mouth daily.    . Chromium 100 MCG TABS Take by mouth daily.      Marland Kitchen  Coenzyme Q10 200 MG capsule Take 200 mg by mouth daily.      Marland Kitchen EPINEPHrine 0.3 mg/0.3 mL IJ SOAJ injection Inject 0.3 mg into the muscle once.    . fish oil-omega-3 fatty acids 1000 MG capsule Take 1 g by mouth daily.      . fluticasone (FLONASE) 50 MCG/ACT nasal spray Place 2 sprays into both nostrils daily.    . folic acid (FOLVITE) 1 MG tablet Take 1 mg by mouth daily.      . insulin aspart (NOVOLOG FLEXPEN) 100 UNIT/ML FlexPen Inject 6 Units into the skin 3 (three) times daily before meals.    . Insulin Glargine (LANTUS SOLOSTAR) 100 UNIT/ML Solostar Pen INJECT 20 UNITS UNDER THE SKIN EVERY MORNING AND 16 UNITS EVERY EVENING AS DIRECTED    . Insulin Pen Needle (B-D UF III  MINI PEN NEEDLES) 31G X 5 MM MISC Use to inject insulin four times daily as directed dx: E11.9 200 each 3  . LANOXIN 250 MCG tablet TAKE 1/2 TABLET BY MOUTH EVERY DAY 45 tablet 0  . levothyroxine (SYNTHROID, LEVOTHROID) 100 MCG tablet Take 100 mcg by mouth daily before breakfast.    . loratadine (CLARITIN) 10 MG tablet Take 10 mg by mouth daily.    Marland Kitchen losartan (COZAAR) 25 MG tablet Take 1 tablet (25 mg total) by mouth daily. 30 tablet 6  . meclizine (ANTIVERT) 25 MG tablet TAKE 1 TABLET BY MOUTH THREE TIMES DAILY AS NEEDED 90 tablet 0  . metFORMIN (GLUCOPHAGE) 1000 MG tablet Take 1,000 mg by mouth daily with breakfast.    . metoprolol succinate (TOPROL-XL) 25 MG 24 hr tablet Take 1 tablet (25 mg total) by mouth daily. 90 tablet 3  . montelukast (SINGULAIR) 10 MG tablet TAKE 1 TABLET BY MOUTH EVERY NIGHT AT BEDTIME 90 tablet 0  . Multiple Vitamin (MULTIVITAMIN) tablet Take 1 tablet by mouth daily.    Marland Kitchen omeprazole (PRILOSEC) 20 MG capsule TAKE 1 CAPSULE BY MOUTH TWICE DAILY BEFORE A MEAL 180 capsule 0  . vitamin B-12 (CYANOCOBALAMIN) 1000 MCG tablet Take 1,000 mcg by mouth daily.    . vitamin C (ASCORBIC ACID) 500 MG tablet Take 500 mg by mouth daily.     No facility-administered medications prior to visit.      Allergies: Cefdinir; Ciprofloxacin; Clarithromycin; Miglitol; Niacin; and Qvar [beclomethasone]    PHYSICAL EXAM: VS:  There were no vitals taken for this visit. , There is no height or weight on file to calculate BMI. Wt Readings from Last 3 Encounters:  07/17/16 160 lb (72.6 kg)  06/15/16 159 lb 12 oz (72.5 kg)  06/14/16 161 lb (73 kg)    GENERAL:  well developed, well nourished, , not in acute distress HEENT: normocephalic, pink conjunctivae, anicteric sclerae, no xanthelasma, normal dentition, oropharynx clear NECK:  no neck vein engorgement, JVP normal, no hepatojugular reflux, carotid upstroke brisk and symmetric, no bruit, no thyromegaly, no lymphadenopathy LUNGS:  good  respiratory effort, clear to auscultation bilaterally CV:  PMI not displaced, no thrills, no lifts, S1 and S2 within normal limits, no palpable S3 or S4, no murmurs, no rubs, no gallops ABD:  Soft, nontender, nondistended, normoactive bowel sounds, no abdominal aortic bruit, no hepatomegaly, no splenomegaly MS: nontender back, no kyphosis, no scoliosis, no joint deformities EXT:  2+ DP/PT pulses, no edema, no varicosities, no cyanosis, no clubbing SKIN: warm, nondiaphoretic, normal turgor, no ulcers NEUROPSYCH: alert, oriented to person, place, and time, sensory/motor grossly intact, normal mood, appropriate  affect  Recent Labs: 06/23/2016: ALT 27; TSH 3.320 07/17/2016: BUN 19; Creatinine, Ser 0.61; Hemoglobin 13.4; Magnesium 1.6; Platelets 204; Potassium 4.2; Sodium 137   Lipid Panel    Component Value Date/Time   CHOL 100 06/23/2016 1236   TRIG 190 (H) 06/23/2016 1236   HDL 44 06/23/2016 1236   CHOLHDL 2.3 06/23/2016 1236   CHOLHDL 2 05/04/2015 1314   VLDL 28.0 05/04/2015 1314   LDLCALC 18 06/23/2016 1236   LDLDIRECT 55.5 03/19/2012 1620     Other studies Reviewed:  EKG:  The ekg from 06/15/2016 was personally reviewed by me and it revealed sinus rhythm, PACs, 71 BPM. Incomplete right bundle-branch block.  Additional studies/ records that were reviewed personally reviewed by me today include:  Echo 07/01/2016: - Left ventricle: The cavity size was normal. Systolic function was   normal. The estimated ejection fraction was in the range of 60%   to 65%. Wall motion was normal; there were no regional wall   motion abnormalities. Doppler parameters are consistent with   abnormal left ventricular relaxation (grade 1 diastolic   dysfunction). - Mitral valve: There was mild regurgitation. - Left atrium: The atrium was normal in size. - Right ventricle: Systolic function was normal. - Pulmonary arteries: Systolic pressure was within the normal   range.  Impressions:  - Rhythm  is normal sinus.  ASSESSMENT AND PLAN: Patient carries a diagnosis of paroxysmal atrial fibrillation Currently in sinus rhythm with PACs Ventricular rate is controlled on metoprolol. Patient is on digoxin that was started many years ago. Recommend cardiac event monitor for further evaluation.  Recommend echocardiogram. Eventually we will try to wean off digoxin. It is unclear whether she's had anymore episodes of atrial fibrillation. If event monitor picks up evidence of A. Fib, oral anticoagulation will be recommended with her chadsvasc 4.  Hypertension Blood pressure within normal limits today Recommend starting losartan 25 mg once a day, mainly for indication for being diabetic. Labs ordered from PCP office yesterday will be reordered so that patient can have them [place].  Hyperlipidemia LDL goal is less than 70 due to diagnosis of diabetes. Patient is to see endocrinologist on follow-up tomorrow.  Current medicines are reviewed at length with the patient today.  The patient does not have concerns regarding medicines.  Labs/ tests ordered today include:  No orders of the defined types were placed in this encounter.   I had a lengthy and detailed discussion with the patient regarding diagnoses, prognosis, diagnostic options, treatment options , and side effects of medications.   I counseled the patient on importance of lifestyle modification including heart healthy diet, regular physical activity   Disposition:   FU with undersigned after tests   I spent at least 60 minutes with the patient today and more than 50% of the time was spent counseling the patient and coordinating care.   Signed, Almond LintAileen Tekeyah Santiago, MD  07/19/2016 3:29 PM    [facility]  This note was generated in part with voice recognition software and I apologize for any typographical errors that were not detected and corrected.

## 2016-07-19 NOTE — Telephone Encounter (Signed)
Spoke to pt. She has been on a Heart Monitor from the cardiologist. They found she had low magnesium and gave her IV Magnesium. It helped her heart flutter.

## 2016-07-21 ENCOUNTER — Other Ambulatory Visit: Payer: Self-pay | Admitting: Internal Medicine

## 2016-07-25 ENCOUNTER — Ambulatory Visit (INDEPENDENT_AMBULATORY_CARE_PROVIDER_SITE_OTHER): Payer: Medicare PPO | Admitting: Internal Medicine

## 2016-07-25 ENCOUNTER — Encounter: Payer: Self-pay | Admitting: Internal Medicine

## 2016-07-25 VITALS — BP 124/84 | HR 58 | Temp 97.5°F | Wt 160.0 lb

## 2016-07-25 DIAGNOSIS — Z23 Encounter for immunization: Secondary | ICD-10-CM | POA: Diagnosis not present

## 2016-07-25 DIAGNOSIS — R002 Palpitations: Secondary | ICD-10-CM | POA: Insufficient documentation

## 2016-07-25 NOTE — Assessment & Plan Note (Signed)
Has monitor on  Not sure if she has downloaded it Nothing found on the ER evaluation and is regular now

## 2016-07-25 NOTE — Addendum Note (Signed)
Addended by: Eual FinesBRIDGES, Etana Beets P on: 07/25/2016 05:35 PM   Modules accepted: Orders

## 2016-07-25 NOTE — Progress Notes (Signed)
Pre visit review using our clinic review tool, if applicable. No additional management support is needed unless otherwise documented below in the visit note. 

## 2016-07-25 NOTE — Progress Notes (Signed)
Subjective:    Patient ID: [name], female    DOB: Oct 07, 1931, 82 y.o.   MRN: [id number]  HPI Here with husband-- ER follow up  Spetember 3rd She had palpitations--started at 6AM. She took her meds and it seemed to go away Recurred 10AM--really felt irregular then To ER --- reviewed the records No chest pain No SOB Has 30 day event monitor on No findings in ER---sent home (after given magnesium for borderline low magnesium)  Has felt okay since then No more recurrent spells Taking OTC magnesium 250mg  since then  Current Outpatient Prescriptions on File Prior to Visit  Medication Sig Dispense Refill  . ACCU-CHEK SMARTVIEW test strip TEST BLOOD SUGAR BID UTD  3  . albuterol (PROVENTIL HFA;VENTOLIN HFA) 108 (90 Base) MCG/ACT inhaler Inhale 2 puffs into the lungs every 6 (six) hours as needed. 18 g 0  . ALPRAZolam (XANAX) 0.25 MG tablet TAKE 1 TABLET BY MOUTH TWICE DAILY 100 tablet 0  . aspirin 81 MG tablet Take 81 mg by mouth daily.      Marland Kitchen. atorvastatin (LIPITOR) 20 MG tablet TAKE 1 TABLET BY MOUTH EVERY DAY 90 tablet 1  . B-D ULTRA-FINE 33 LANCETS MISC Use to test blood sugar twice daily dx: E11.9 200 each 3  . calcitonin, salmon, (MIACALCIN/FORTICAL) 200 UNIT/ACT nasal spray USE 1 SPRAY IN THE NOSE EVERY DAY(ALTERNATING NOSTRILS) 3.7 mL 11  . CALCIUM-VITAMIN D PO Take 1 tablet by mouth daily.      . cetirizine (ZYRTEC) 1 MG/ML syrup Take 10 mg by mouth daily.    . Chromium 100 MCG TABS Take by mouth daily.      . Coenzyme Q10 200 MG capsule Take 200 mg by mouth daily.      Marland Kitchen. EPINEPHrine 0.3 mg/0.3 mL IJ SOAJ injection Inject 0.3 mg into the muscle once.    . fish oil-omega-3 fatty acids 1000 MG capsule Take 1 g by mouth daily.      . fluticasone (FLONASE) 50 MCG/ACT nasal spray Place 2 sprays into both nostrils daily.    . folic acid (FOLVITE) 1 MG tablet Take 1 mg by mouth daily.      . insulin aspart (NOVOLOG FLEXPEN) 100 UNIT/ML FlexPen Inject 6 Units into the skin 3  (three) times daily before meals.    . Insulin Glargine (LANTUS SOLOSTAR) 100 UNIT/ML Solostar Pen INJECT 20 UNITS UNDER THE SKIN EVERY MORNING AND 16 UNITS EVERY EVENING AS DIRECTED    . Insulin Pen Needle (B-D UF III MINI PEN NEEDLES) 31G X 5 MM MISC Use to inject insulin four times daily as directed dx: E11.9 200 each 3  . LANOXIN 250 MCG tablet TAKE 1/2 TABLET BY MOUTH EVERY DAY 45 tablet 0  . levothyroxine (SYNTHROID, LEVOTHROID) 100 MCG tablet Take 100 mcg by mouth daily before breakfast.    . loratadine (CLARITIN) 10 MG tablet Take 10 mg by mouth daily.    Marland Kitchen. losartan (COZAAR) 25 MG tablet Take 1 tablet (25 mg total) by mouth daily. 30 tablet 6  . meclizine (ANTIVERT) 25 MG tablet TAKE 1 TABLET BY MOUTH THREE TIMES DAILY AS NEEDED 90 tablet 0  . metFORMIN (GLUCOPHAGE) 1000 MG tablet Take 1,000 mg by mouth daily with breakfast. TAKING 1 IN THE MORNING AND 2 IN EVENING    . metoprolol succinate (TOPROL-XL) 25 MG 24 hr tablet Take 1 tablet (25 mg total) by mouth daily. 90 tablet 3  . montelukast (SINGULAIR) 10 MG tablet TAKE 1  TABLET BY MOUTH EVERY NIGHT AT BEDTIME 90 tablet 0  . Multiple Vitamin (MULTIVITAMIN) tablet Take 1 tablet by mouth daily.    Marland Kitchen omeprazole (PRILOSEC) 20 MG capsule TAKE 1 CAPSULE BY MOUTH TWICE DAILY BEFORE A MEAL 180 capsule 0  . vitamin B-12 (CYANOCOBALAMIN) 1000 MCG tablet Take 1,000 mcg by mouth daily.    . vitamin C (ASCORBIC ACID) 500 MG tablet Take 500 mg by mouth daily.     No current facility-administered medications on file prior to visit.     Allergies  Allergen Reactions  . Cefdinir     REACTION: unspecified  . Ciprofloxacin     rash  . Clarithromycin     REACTION: weird dreams (on prednisone at the same time but has tolerated this in the past)  . Miglitol     REACTION: unspecified  . Niacin     REACTION: unspecified  . Qvar [Beclomethasone] Other (See Comments)    Feels funny Feels funny    Past Medical History:  Diagnosis Date  . Allergy     . Asthma   . Atrial fibrillation (HCC)   . Diabetes mellitus   . GERD (gastroesophageal reflux disease)   . Hyperlipidemia   . Osteopenia   . Sleep disorder   . Thyroid disease     Past Surgical History:  Procedure Laterality Date  . CERVICAL CONE BIOPSY  1960's  . CHOLECYSTECTOMY    . NASAL POLYP SURGERY  01/01  . STRABISMUS SURGERY  1946 / 97  . VAGINAL DELIVERY     x2    Family History  Problem Relation Age of Onset  . Asthma Mother   . Coronary artery disease Father     Social History   Social History  . Marital status: Married    Spouse name: N/A  . Number of children: 2  . Years of education: N/A   Occupational History  .  Retired   Social History Main Topics  . Smoking status: Never Smoker  . Smokeless tobacco: Never Used  . Alcohol use 0.0 oz/week     Comment: 1 glass of wine per day  . Drug use: No  . Sexual activity: Not on file   Other Topics Concern  . Not on file   Social History Narrative   Has living will   Not sure about formal POA --but requests husband, then daughter [name] (and son [name]) to be health care POAs   Would accept resuscitation attempts   Not sure about feeding tubes   Review of Systems Appetite is not great-- "but I eat because I have to" Gaining weight which she attributes to her diabetes regimen Back on metformin (ER)--not causing the same diarrhea Sleeps okay most of the time    Objective:   Physical Exam  Constitutional: She appears well-developed and well-nourished. No distress.  Cardiovascular: Normal rate, regular rhythm and normal heart sounds.  Exam reveals no gallop.   No murmur heard. Pulmonary/Chest: Effort normal and breath sounds normal. No respiratory distress. She has no wheezes. She has no rales.  Musculoskeletal: She exhibits no edema.          Assessment & Plan:

## 2016-07-28 ENCOUNTER — Other Ambulatory Visit: Payer: Self-pay | Admitting: Internal Medicine

## 2016-07-28 ENCOUNTER — Ambulatory Visit: Payer: Medicare PPO | Admitting: Cardiology

## 2016-07-28 ENCOUNTER — Telehealth: Payer: Self-pay | Admitting: Internal Medicine

## 2016-07-28 NOTE — Telephone Encounter (Signed)
The rx was sent earlier for #45 and 3 refills. That is a 90 day supply since she takes 1/2 daily

## 2016-07-28 NOTE — Telephone Encounter (Signed)
Okay to change to 90 days

## 2016-07-28 NOTE — Telephone Encounter (Signed)
Mr. Baird LyonsCasey called regarding the Lenoxin prescription.  It is currently a 30 day RX and they would like it in a 90 day supply.  He said [facility] recently send a fax for a refill so this would be a good time to change the supply from 30 to 90 days.

## 2016-08-02 NOTE — Progress Notes (Signed)
Cardiology Office Note   Date:  08/09/2016   ID:  [name], DOB [birth year], MRN [id number]  Referring Doctor:  Tillman Abideichard Letvak, MD   Cardiologist:   Almond LintAileen Leith Hedlund, MD   Reason for consultation:  Chief Complaint  Patient presents with  . PAF      History of Present Illness: [name] is a 82 y.o. female who presents for ffup after tests, history of paroxysmal atrial fibrillation.  Patient describes being diagnosed with paroxysmal atrial fibrillation approximately 23 years ago. She presented with significant palpitations. She was placed on metoprolol and digoxin. She has not seen a cardiologist for many years now. She recalls only having that one episode back that. She would have occasional fluttering in her chest but nothing as severe as that initial presentation.  She presented to the ER one time while she had the monitor. She felt her heart pounding at the time. That eventually resolved. There was no evidence of any arrhythmia on the monitor. She has no limitations with physical activity in terms of shortness of breath and chest pain. She can do shopping and grocery and can climb at least a flight of stairs without any difficulties.  In terms of BP, she has noticed improvement. She has procured a new BP monitor.  She needs to remember to place the cuff correctly (not too loose).  She denies fever, cough, colds, abdominal pain. No PND, orthopnea, edema.   ROS:  Please see the history of present illness. Aside from mentioned under HPI, all other systems are reviewed and negative.     Past Medical History:  Diagnosis Date  . Allergy   . Asthma   . Atrial fibrillation (HCC)   . Diabetes mellitus   . GERD (gastroesophageal reflux disease)   . Hyperlipidemia   . Osteopenia   . Sleep disorder   . Thyroid disease     Past Surgical History:  Procedure Laterality Date  . CERVICAL CONE BIOPSY  1960's  . CHOLECYSTECTOMY    . NASAL POLYP SURGERY  01/01  .  STRABISMUS SURGERY  1946 / [id number]  . VAGINAL DELIVERY     x2     reports that she has never smoked. She has never used smokeless tobacco. She reports that she drinks alcohol. She reports that she does not use drugs.   family history includes Asthma in her mother; Coronary artery disease in her father.   Outpatient Medications Prior to Visit  Medication Sig Dispense Refill  . ACCU-CHEK SMARTVIEW test strip TEST BLOOD SUGAR BID UTD  3  . albuterol (PROVENTIL HFA;VENTOLIN HFA) 108 (90 Base) MCG/ACT inhaler Inhale 2 puffs into the lungs every 6 (six) hours as needed. 18 g 0  . ALPRAZolam (XANAX) 0.25 MG tablet TAKE 1 TABLET BY MOUTH TWICE DAILY 100 tablet 0  . aspirin 81 MG tablet Take 81 mg by mouth daily.      Marland Kitchen. atorvastatin (LIPITOR) 20 MG tablet TAKE 1 TABLET BY MOUTH EVERY DAY 90 tablet 1  . B-D ULTRA-FINE 33 LANCETS MISC Use to test blood sugar twice daily dx: E11.9 200 each 3  . calcitonin, salmon, (MIACALCIN/FORTICAL) 200 UNIT/ACT nasal spray USE 1 SPRAY IN THE NOSE EVERY DAY(ALTERNATING NOSTRILS) 3.7 mL 11  . CALCIUM-VITAMIN D PO Take 1 tablet by mouth daily.      . cetirizine (ZYRTEC) 1 MG/ML syrup Take 10 mg by mouth daily.    . Chromium 100 MCG TABS Take by mouth daily.      .Marland Kitchen  Coenzyme Q10 200 MG capsule Take 200 mg by mouth daily.      Marland Kitchen EPINEPHrine 0.3 mg/0.3 mL IJ SOAJ injection Inject 0.3 mg into the muscle once.    . fish oil-omega-3 fatty acids 1000 MG capsule Take 1 g by mouth daily.      . fluticasone (FLONASE) 50 MCG/ACT nasal spray Place 2 sprays into both nostrils daily.    . folic acid (FOLVITE) 1 MG tablet Take 1 mg by mouth daily.      . Insulin Glargine (LANTUS SOLOSTAR) 100 UNIT/ML Solostar Pen INJECT 28 UNITS UNDER THE SKIN EVERY MORNING AND 16 UNITS EVERY EVENING AS DIRECTED    . Insulin Pen Needle (B-D UF III MINI PEN NEEDLES) 31G X 5 MM MISC Use to inject insulin four times daily as directed dx: E11.9 200 each 3  . LANOXIN 250 MCG tablet TAKE 1/2 TABLET BY MOUTH  EVERY DAY 45 tablet 3  . levothyroxine (SYNTHROID, LEVOTHROID) 100 MCG tablet Take 100 mcg by mouth daily before breakfast.    . loratadine (CLARITIN) 10 MG tablet Take 10 mg by mouth daily.    Marland Kitchen losartan (COZAAR) 25 MG tablet Take 1 tablet (25 mg total) by mouth daily. 30 tablet 6  . meclizine (ANTIVERT) 25 MG tablet TAKE 1 TABLET BY MOUTH THREE TIMES DAILY AS NEEDED 90 tablet 0  . metFORMIN (GLUCOPHAGE) 1000 MG tablet Take 1,000 mg by mouth daily with breakfast. TAKING 1 IN THE MORNING AND 2 IN EVENING    . metoprolol succinate (TOPROL-XL) 25 MG 24 hr tablet Take 1 tablet (25 mg total) by mouth daily. 90 tablet 3  . montelukast (SINGULAIR) 10 MG tablet TAKE 1 TABLET BY MOUTH EVERY NIGHT AT BEDTIME 90 tablet 0  . Multiple Vitamin (MULTIVITAMIN) tablet Take 1 tablet by mouth daily.    Marland Kitchen omeprazole (PRILOSEC) 20 MG capsule TAKE 1 CAPSULE BY MOUTH TWICE DAILY BEFORE A MEAL 180 capsule 0  . vitamin B-12 (CYANOCOBALAMIN) 1000 MCG tablet Take 1,000 mcg by mouth daily.    . vitamin C (ASCORBIC ACID) 500 MG tablet Take 500 mg by mouth daily.    . insulin aspart (NOVOLOG FLEXPEN) 100 UNIT/ML FlexPen Inject 6 Units into the skin 3 (three) times daily before meals.     No facility-administered medications prior to visit.      Allergies: Cefdinir; Ciprofloxacin; Clarithromycin; Miglitol; Niacin; and Qvar [beclomethasone]    PHYSICAL EXAM: VS:  BP (!) 160/66   Pulse 73   Ht 5\' 2"  (1.575 m)   Wt 158 lb (71.7 kg)   SpO2 95%   BMI 28.90 kg/m  , Body mass index is 28.9 kg/m. Wt Readings from Last 3 Encounters:  08/08/16 158 lb (71.7 kg)  07/25/16 160 lb (72.6 kg)  07/17/16 160 lb (72.6 kg)    GENERAL:  well developed, well nourished, , not in acute distress HEENT: normocephalic, pink conjunctivae, anicteric sclerae, no xanthelasma, normal dentition, oropharynx clear NECK:  no neck vein engorgement, JVP normal, no hepatojugular reflux, carotid upstroke brisk and symmetric, no bruit, no  thyromegaly, no lymphadenopathy LUNGS:  good respiratory effort, clear to auscultation bilaterally CV:  PMI not displaced, no thrills, no lifts, S1 and S2 within normal limits, no palpable S3 or S4, no murmurs, no rubs, no gallops ABD:  Soft, nontender, nondistended, normoactive bowel sounds, no abdominal aortic bruit, no hepatomegaly, no splenomegaly MS: nontender back, no kyphosis, no scoliosis, no joint deformities EXT:  2+ DP/PT pulses, no edema, no varicosities,  no cyanosis, no clubbing SKIN: warm, nondiaphoretic, normal turgor, no ulcers NEUROPSYCH: alert, oriented to person, place, and time, sensory/motor grossly intact, normal mood, appropriate affect  Recent Labs: 06/23/2016: ALT 27; TSH 3.320 07/17/2016: BUN 19; Creatinine, Ser 0.61; Hemoglobin 13.4; Magnesium 1.6; Platelets 204; Potassium 4.2; Sodium 137   Lipid Panel    Component Value Date/Time   CHOL 100 06/23/2016 1236   TRIG 190 (H) 06/23/2016 1236   HDL 44 06/23/2016 1236   CHOLHDL 2.3 06/23/2016 1236   CHOLHDL 2 05/04/2015 1314   VLDL 28.0 05/04/2015 1314   LDLCALC 18 06/23/2016 1236   LDLDIRECT 55.5 03/19/2012 1620     Other studies Reviewed:  EKG:  The ekg from 06/15/2016 was personally reviewed by me and it revealed sinus rhythm, PACs, 71 BPM. Incomplete right bundle-branch block.  EKG from 08/08/2016 was personally reviewed by me and it revealed sinus rhythm, 60 BPM. Incomplete right bundle-branch block. No significant change from EKG 06/15/2016.  Additional studies/ records that were reviewed personally reviewed by me today include:  Echo 07/01/2016: - Left ventricle: The cavity size was normal. Systolic function was   normal. The estimated ejection fraction was in the range of 60%   to 65%. Wall motion was normal; there were no regional wall   motion abnormalities. Doppler parameters are consistent with   abnormal left ventricular relaxation (grade 1 diastolic   dysfunction). - Mitral valve: There was  mild regurgitation. - Left atrium: The atrium was normal in size. - Right ventricle: Systolic function was normal. - Pulmonary arteries: Systolic pressure was within the normal   range.  Impressions:  - Rhythm is normal sinus.  Event monitor 08/02/2016: Monitoring period was 07/01/2016 to 07/30/2016.  Baseline rhythm was sinus, heart rate of 72 BPM. Heart rate ranged from 49-112 BPM, average of 68 BPM. No atrial fibrillation.   manually detected events: Symptoms of chest pain, accidental push - sinus rhythm, sinus rhythm with first-degree AV block  ASSESSMENT AND PLAN: Patient carries a diagnosis of paroxysmal atrial fibrillation Currently in sinus rhythm Incomplete right bundle-branch block on EKG Ventricular rate is controlled on metoprolol. Patient is on digoxin that was started many years ago. No evidence of atrial fibrillation on event monitor. EF is within normal limits on echo. Recommend to wean off digoxin. There is no clear indication to remain on it. Advised to take half a tablet every other day for 2 weeks then discontinue.  Continue to monitor for any palpitations; may need to repeat monitoring with Holter or event monitor at that time. If event monitor picks up evidence of A. Fib, oral anticoagulation will be recommended with her chadsvasc 4.  Hypertension Blood pressure improving. Continue medications.  Hyperlipidemia LDL goal is less than 70 due to diagnosis of diabetes. PCP following labs.  Current medicines are reviewed at length with the patient today.  The patient does not have concerns regarding medicines.  Labs/ tests ordered today include:  Orders Placed This Encounter  Procedures  . EKG 12-Lead    I had a lengthy and detailed discussion with the patient regarding diagnoses, prognosis, diagnostic options, treatment options , and side effects of medications.   I counseled the patient on importance of lifestyle modification including heart healthy  diet, regular physical activity   Disposition:   FU with undersigned In 6 months   I spent at least 60 minutes with the patient today and more than 50% of the time was spent counseling the patient and coordinating care.  Signed, Almond Lint, MD  08/09/2016 9:38 AM    [facility]  This note was generated in part with voice recognition software and I apologize for any typographical errors that were not detected and corrected.

## 2016-08-08 ENCOUNTER — Ambulatory Visit (INDEPENDENT_AMBULATORY_CARE_PROVIDER_SITE_OTHER): Payer: Medicare PPO | Admitting: Cardiology

## 2016-08-08 ENCOUNTER — Encounter: Payer: Self-pay | Admitting: Cardiology

## 2016-08-08 VITALS — BP 160/66 | HR 73 | Ht 62.0 in | Wt 158.0 lb

## 2016-08-08 DIAGNOSIS — I10 Essential (primary) hypertension: Secondary | ICD-10-CM

## 2016-08-08 DIAGNOSIS — I480 Paroxysmal atrial fibrillation: Secondary | ICD-10-CM | POA: Diagnosis not present

## 2016-08-08 DIAGNOSIS — E785 Hyperlipidemia, unspecified: Secondary | ICD-10-CM

## 2016-08-08 NOTE — Patient Instructions (Signed)
Medication Instructions:  Your physician has recommended you make the following change in your medication:  1. Take 1/2 tablet of Digoxin every other day for 2 weeks then STOP TAKING.  Follow-Up: Your physician wants you to follow-up in: 6 months with Dr. Alvino ChapelIngal. You will receive a reminder letter in the mail two months in advance. If you don't receive a letter, please call our office to schedule the follow-up appointment.  It was a pleasure seeing you today here in the office. Please do not hesitate to give us a call back if you have any further questions. [phone]  Macon CellarPamela A. RN, BSN

## 2016-08-09 DIAGNOSIS — E1165 Type 2 diabetes mellitus with hyperglycemia: Secondary | ICD-10-CM | POA: Diagnosis not present

## 2016-08-09 DIAGNOSIS — Z794 Long term (current) use of insulin: Secondary | ICD-10-CM | POA: Diagnosis not present

## 2016-08-15 ENCOUNTER — Other Ambulatory Visit: Payer: Self-pay | Admitting: Internal Medicine

## 2016-08-15 NOTE — Telephone Encounter (Signed)
Last filled 12-07-15 #100 Last OV 07-25-16

## 2016-08-15 NOTE — Telephone Encounter (Signed)
Approved: #100 x 0 Change it to bid prn

## 2016-08-15 NOTE — Telephone Encounter (Signed)
Left refill on voice mail at pharmacy  

## 2016-08-16 DIAGNOSIS — H1013 Acute atopic conjunctivitis, bilateral: Secondary | ICD-10-CM | POA: Diagnosis not present

## 2016-08-17 DIAGNOSIS — J019 Acute sinusitis, unspecified: Secondary | ICD-10-CM | POA: Diagnosis not present

## 2016-08-17 DIAGNOSIS — J339 Nasal polyp, unspecified: Secondary | ICD-10-CM | POA: Diagnosis not present

## 2016-08-18 DIAGNOSIS — E1165 Type 2 diabetes mellitus with hyperglycemia: Secondary | ICD-10-CM | POA: Diagnosis not present

## 2016-08-18 DIAGNOSIS — E039 Hypothyroidism, unspecified: Secondary | ICD-10-CM | POA: Diagnosis not present

## 2016-08-18 DIAGNOSIS — E538 Deficiency of other specified B group vitamins: Secondary | ICD-10-CM | POA: Diagnosis not present

## 2016-08-18 DIAGNOSIS — Z794 Long term (current) use of insulin: Secondary | ICD-10-CM | POA: Diagnosis not present

## 2016-08-24 DIAGNOSIS — H1013 Acute atopic conjunctivitis, bilateral: Secondary | ICD-10-CM | POA: Diagnosis not present

## 2016-08-30 DIAGNOSIS — J329 Chronic sinusitis, unspecified: Secondary | ICD-10-CM | POA: Diagnosis not present

## 2016-08-30 DIAGNOSIS — J339 Nasal polyp, unspecified: Secondary | ICD-10-CM | POA: Diagnosis not present

## 2016-10-14 ENCOUNTER — Ambulatory Visit: Payer: Medicare PPO | Admitting: Internal Medicine

## 2016-10-21 ENCOUNTER — Encounter: Payer: Self-pay | Admitting: Internal Medicine

## 2016-10-21 ENCOUNTER — Ambulatory Visit (INDEPENDENT_AMBULATORY_CARE_PROVIDER_SITE_OTHER): Payer: Medicare PPO | Admitting: Internal Medicine

## 2016-10-21 ENCOUNTER — Ambulatory Visit (INDEPENDENT_AMBULATORY_CARE_PROVIDER_SITE_OTHER)
Admission: RE | Admit: 2016-10-21 | Discharge: 2016-10-21 | Disposition: A | Discharge status: Home / Self Care | Payer: Medicare PPO | Source: Ambulatory Visit | Attending: Internal Medicine | Admitting: Internal Medicine

## 2016-10-21 VITALS — BP 122/78 | HR 58 | Temp 97.5°F | Resp 12 | Wt 158.0 lb

## 2016-10-21 DIAGNOSIS — R059 Cough, unspecified: Secondary | ICD-10-CM

## 2016-10-21 DIAGNOSIS — R1310 Dysphagia, unspecified: Secondary | ICD-10-CM | POA: Diagnosis not present

## 2016-10-21 DIAGNOSIS — R05 Cough: Secondary | ICD-10-CM | POA: Diagnosis not present

## 2016-10-21 DIAGNOSIS — R1319 Other dysphagia: Secondary | ICD-10-CM | POA: Insufficient documentation

## 2016-10-21 NOTE — Progress Notes (Signed)
Subjective:    Patient ID: [name], female    DOB: 1931-04-27, 82 y.o.   MRN: [id number]  HPI Here due to cough Some at night--but mostly in AM Daughter and husband concerned--she isn't  Will get slight mucus at times Not sick though No fever Uses the albuterol ~2-3 times per week No SOB Not very active--but can run up stairs without coughing No wheeze  Current Outpatient Prescriptions on File Prior to Visit  Medication Sig Dispense Refill  . ACCU-CHEK SMARTVIEW test strip TEST BLOOD SUGAR BID UTD  3  . albuterol (PROVENTIL HFA;VENTOLIN HFA) 108 (90 Base) MCG/ACT inhaler Inhale 2 puffs into the lungs every 6 (six) hours as needed. 18 g 0  . ALPRAZolam (XANAX) 0.5 MG tablet Take 1 tablet (0.5 mg total) by mouth 2 (two) times daily as needed. 100 tablet 0  . aspirin 81 MG tablet Take 81 mg by mouth daily.      Marland Kitchen. atorvastatin (LIPITOR) 20 MG tablet TAKE 1 TABLET BY MOUTH EVERY DAY 90 tablet 1  . B-D ULTRA-FINE 33 LANCETS MISC Use to test blood sugar twice daily dx: E11.9 200 each 3  . calcitonin, salmon, (MIACALCIN/FORTICAL) 200 UNIT/ACT nasal spray USE 1 SPRAY IN THE NOSE EVERY DAY(ALTERNATING NOSTRILS) 3.7 mL 11  . CALCIUM-VITAMIN D PO Take 1 tablet by mouth daily.      . cetirizine (ZYRTEC) 1 MG/ML syrup Take 10 mg by mouth daily.    . Chromium 100 MCG TABS Take by mouth daily.      . Coenzyme Q10 200 MG capsule Take 200 mg by mouth daily.      Marland Kitchen. EPINEPHrine 0.3 mg/0.3 mL IJ SOAJ injection Inject 0.3 mg into the muscle once.    . fish oil-omega-3 fatty acids 1000 MG capsule Take 1 g by mouth daily.      . fluticasone (FLONASE) 50 MCG/ACT nasal spray Place 2 sprays into both nostrils daily.    . folic acid (FOLVITE) 1 MG tablet Take 1 mg by mouth daily.      . Insulin Glargine (LANTUS SOLOSTAR) 100 UNIT/ML Solostar Pen INJECT 28 UNITS UNDER THE SKIN EVERY MORNING AND 16 UNITS EVERY EVENING AS DIRECTED    . Insulin Pen Needle (B-D UF III MINI PEN NEEDLES) 31G X 5 MM MISC  Use to inject insulin four times daily as directed dx: E11.9 200 each 3  . levothyroxine (SYNTHROID, LEVOTHROID) 100 MCG tablet Take 100 mcg by mouth daily before breakfast.    . loratadine (CLARITIN) 10 MG tablet Take 10 mg by mouth daily.    . meclizine (ANTIVERT) 25 MG tablet TAKE 1 TABLET BY MOUTH THREE TIMES DAILY AS NEEDED 90 tablet 0  . metFORMIN (GLUCOPHAGE) 1000 MG tablet Take 1,000 mg by mouth daily with breakfast. TAKING 1 IN THE MORNING AND 2 IN EVENING    . metoprolol succinate (TOPROL-XL) 25 MG 24 hr tablet Take 1 tablet (25 mg total) by mouth daily. 90 tablet 3  . montelukast (SINGULAIR) 10 MG tablet TAKE 1 TABLET BY MOUTH EVERY NIGHT AT BEDTIME 90 tablet 0  . Multiple Vitamin (MULTIVITAMIN) tablet Take 1 tablet by mouth daily.    Marland Kitchen. omeprazole (PRILOSEC) 20 MG capsule TAKE 1 CAPSULE BY MOUTH TWICE DAILY BEFORE A MEAL 180 capsule 0  . vitamin B-12 (CYANOCOBALAMIN) 1000 MCG tablet Take 1,000 mcg by mouth daily.    . vitamin C (ASCORBIC ACID) 500 MG tablet Take 500 mg by mouth daily.    .Marland Kitchen  losartan (COZAAR) 25 MG tablet Take 1 tablet (25 mg total) by mouth daily. 30 tablet 6   No current facility-administered medications on file prior to visit.     Allergies  Allergen Reactions  . Cefdinir     REACTION: unspecified  . Ciprofloxacin     rash  . Clarithromycin     REACTION: weird dreams (on prednisone at the same time but has tolerated this in the past)  . Miglitol     REACTION: unspecified  . Niacin     REACTION: unspecified  . Qvar [Beclomethasone] Other (See Comments)    Feels funny Feels funny    Past Medical History:  Diagnosis Date  . Allergy   . Asthma   . Atrial fibrillation (HCC)   . Diabetes mellitus   . GERD (gastroesophageal reflux disease)   . Hyperlipidemia   . Osteopenia   . Sleep disorder   . Thyroid disease     Past Surgical History:  Procedure Laterality Date  . CERVICAL CONE BIOPSY  1960's  . CHOLECYSTECTOMY    . NASAL POLYP SURGERY  01/01   . STRABISMUS SURGERY  1946 / [id number]  . VAGINAL DELIVERY     x2    Family History  Problem Relation Age of Onset  . Asthma Mother   . Coronary artery disease Father     Social History   Social History  . Marital status: Married    Spouse name: N/A  . Number of children: 2  . Years of education: N/A   Occupational History  .  Retired   Social History Main Topics  . Smoking status: Never Smoker  . Smokeless tobacco: Never Used  . Alcohol use 0.0 oz/week     Comment: 1 glass of wine per day  . Drug use: No  . Sexual activity: Not on file   Other Topics Concern  . Not on file   Social History Narrative   Has living will   Not sure about formal POA --but requests husband, then daughter [name] (and son [name]) to be health care POAs   Would accept resuscitation attempts   Not sure about feeding tubes   Review of Systems Continues on her allergy regimen Some nasal drainage and PND Some heartburn-- mostly controlled with the PPI (will take tums if symptoms after eating out) Does have some dysphagia--things get stuck at times (mucus and some trouble with bigger pills)    Objective:   Physical Exam  Constitutional: She appears well-nourished. No distress.  HENT:  Mouth/Throat: Oropharynx is clear and moist. No oropharyngeal exudate.  Mild pale nasal swelling  Neck: Normal range of motion. Neck supple. No thyromegaly present.  Cardiovascular: Normal rate, regular rhythm and normal heart sounds.  Exam reveals no gallop.   No murmur heard. Pulmonary/Chest: Effort normal and breath sounds normal. No respiratory distress. She has no wheezes. She has no rales.  Abdominal: Soft. There is no tenderness.  Musculoskeletal: She exhibits no edema.  Lymphadenopathy:    She has no cervical adenopathy.          Assessment & Plan:

## 2016-10-21 NOTE — Progress Notes (Signed)
Pre visit review using our clinic review tool, if applicable. No additional management support is needed unless otherwise documented below in the visit note. 

## 2016-10-21 NOTE — Assessment & Plan Note (Signed)
Now with sig swallowing issues Will set up with GI

## 2016-10-21 NOTE — Assessment & Plan Note (Signed)
History of AM predominance suggests allergies as main cause She is not bothered by it Will hold off on further Rx  Could be some asthma but no wheezing or exertional symptoms Does have reflux--may be a component. Needs GI eval Will just recheck CXR

## 2016-10-22 ENCOUNTER — Other Ambulatory Visit: Payer: Self-pay | Admitting: Internal Medicine

## 2016-10-25 DIAGNOSIS — H6122 Impacted cerumen, left ear: Secondary | ICD-10-CM | POA: Diagnosis not present

## 2016-10-25 DIAGNOSIS — H902 Conductive hearing loss, unspecified: Secondary | ICD-10-CM | POA: Diagnosis not present

## 2016-10-25 DIAGNOSIS — R1310 Dysphagia, unspecified: Secondary | ICD-10-CM | POA: Diagnosis not present

## 2016-10-28 ENCOUNTER — Telehealth: Payer: Self-pay | Admitting: Internal Medicine

## 2016-10-28 NOTE — Telephone Encounter (Signed)
Called patient, she wants us to cancel the Referral to GI. She saw Dr Jenne CampusMcqueen ENT and he looked down her throat an reassured her so she doesn't want to go now. I will cancel the GII Referral.

## 2016-10-29 NOTE — Telephone Encounter (Signed)
okay

## 2016-11-13 ENCOUNTER — Other Ambulatory Visit: Payer: Self-pay | Admitting: Internal Medicine

## 2016-11-16 DIAGNOSIS — E113393 Type 2 diabetes mellitus with moderate nonproliferative diabetic retinopathy without macular edema, bilateral: Secondary | ICD-10-CM | POA: Diagnosis not present

## 2016-11-16 LAB — HM DIABETES EYE EXAM

## 2016-11-21 ENCOUNTER — Encounter: Payer: Self-pay | Admitting: Internal Medicine

## 2016-11-23 DIAGNOSIS — Z794 Long term (current) use of insulin: Secondary | ICD-10-CM | POA: Diagnosis not present

## 2016-11-23 DIAGNOSIS — E1165 Type 2 diabetes mellitus with hyperglycemia: Secondary | ICD-10-CM | POA: Diagnosis not present

## 2016-11-23 DIAGNOSIS — E039 Hypothyroidism, unspecified: Secondary | ICD-10-CM | POA: Diagnosis not present

## 2016-11-23 DIAGNOSIS — E538 Deficiency of other specified B group vitamins: Secondary | ICD-10-CM | POA: Diagnosis not present

## 2016-11-23 LAB — HEMOGLOBIN A1C: Hemoglobin A1C: 7.2

## 2016-11-28 ENCOUNTER — Other Ambulatory Visit: Payer: Self-pay | Admitting: Internal Medicine

## 2016-12-11 ENCOUNTER — Other Ambulatory Visit: Payer: Self-pay | Admitting: Internal Medicine

## 2016-12-27 DIAGNOSIS — E11649 Type 2 diabetes mellitus with hypoglycemia without coma: Secondary | ICD-10-CM | POA: Diagnosis not present

## 2016-12-27 DIAGNOSIS — E538 Deficiency of other specified B group vitamins: Secondary | ICD-10-CM | POA: Diagnosis not present

## 2016-12-27 DIAGNOSIS — Z794 Long term (current) use of insulin: Secondary | ICD-10-CM | POA: Diagnosis not present

## 2016-12-27 DIAGNOSIS — E039 Hypothyroidism, unspecified: Secondary | ICD-10-CM | POA: Diagnosis not present

## 2017-01-12 ENCOUNTER — Telehealth: Payer: Self-pay | Admitting: *Deleted

## 2017-01-12 NOTE — Telephone Encounter (Signed)
PT dropped off a form for a handicap placard renewal. Please complete it and get it back to patient. She would like it to be mailed to her if possible. If not she said she will come pick it up. Please call if unable to mail it [phone]

## 2017-01-13 NOTE — Telephone Encounter (Signed)
I spoke to patient's daughter.  Patient's daughter will come by to pick up form today.

## 2017-01-22 ENCOUNTER — Other Ambulatory Visit: Payer: Self-pay | Admitting: Internal Medicine

## 2017-01-25 ENCOUNTER — Other Ambulatory Visit: Payer: Self-pay | Admitting: Internal Medicine

## 2017-01-25 NOTE — Telephone Encounter (Signed)
[facility] [address] left v/m requesting refill meclizine. Last refilled # 90 x 0 on 10/24/16; pt last seen f/u 07/25/16.

## 2017-01-25 NOTE — Telephone Encounter (Signed)
Approved: #90 x 1 

## 2017-02-02 ENCOUNTER — Encounter: Payer: Self-pay | Admitting: Internal Medicine

## 2017-02-02 ENCOUNTER — Ambulatory Visit (INDEPENDENT_AMBULATORY_CARE_PROVIDER_SITE_OTHER): Payer: Medicare PPO | Admitting: Internal Medicine

## 2017-02-02 VITALS — BP 132/80 | HR 73 | Temp 97.9°F | Ht 62.5 in | Wt 161.0 lb

## 2017-02-02 DIAGNOSIS — Z7189 Other specified counseling: Secondary | ICD-10-CM | POA: Insufficient documentation

## 2017-02-02 DIAGNOSIS — Z794 Long term (current) use of insulin: Secondary | ICD-10-CM | POA: Diagnosis not present

## 2017-02-02 DIAGNOSIS — F39 Unspecified mood [affective] disorder: Secondary | ICD-10-CM | POA: Diagnosis not present

## 2017-02-02 DIAGNOSIS — I480 Paroxysmal atrial fibrillation: Secondary | ICD-10-CM | POA: Diagnosis not present

## 2017-02-02 DIAGNOSIS — IMO0001 Reserved for inherently not codable concepts without codable children: Secondary | ICD-10-CM

## 2017-02-02 DIAGNOSIS — Z0000 Encounter for general adult medical examination without abnormal findings: Secondary | ICD-10-CM

## 2017-02-02 DIAGNOSIS — E1165 Type 2 diabetes mellitus with hyperglycemia: Secondary | ICD-10-CM | POA: Diagnosis not present

## 2017-02-02 DIAGNOSIS — I10 Essential (primary) hypertension: Secondary | ICD-10-CM | POA: Diagnosis not present

## 2017-02-02 NOTE — Assessment & Plan Note (Signed)
Rare Off meds

## 2017-02-02 NOTE — Assessment & Plan Note (Signed)
Doing better now Working with Dr Renae FicklePaul

## 2017-02-02 NOTE — Assessment & Plan Note (Signed)
Blank forms given 

## 2017-02-02 NOTE — Assessment & Plan Note (Signed)
BP Readings from Last 3 Encounters:  02/02/17 132/80  10/21/16 122/78  08/08/16 (!) 160/66   Good control now

## 2017-02-02 NOTE — Progress Notes (Signed)
Pre visit review using our clinic review tool, if applicable. No additional management support is needed unless otherwise documented below in the visit note. 

## 2017-02-02 NOTE — Progress Notes (Signed)
Subjective:    Patient ID: Hannah BelfastIrmgard P Gillies, female    DOB: 1931-07-15, 83 y.o.   MRN: [id number]  HPI Here for Medicare wellness visit and follow up of chronic medical problems Reviewed form and advanced directives Reviewed other doctors Occasional alcohol No tobacco Not really exercising Vision okay Mild hearing problems--has aides but inconsistent with them No falls No depression or anhedonia Independent with instrumental ADLs No apparent memory problems  Tried a melatonin this morning--couldn't get back to sleep after husband woke her Seemed to make her very nauseated  Continues to see Dr Renae FicklePaul for her diabetes Seems to be doing well with this A1c was down so her meds were cut to prevent hypoglycemia  Uses 1/2 alprazolam to help her sleep Mild anxiety only Doesn't use during the day Occasional anger--- not really depressed or anhedonic  Cardiology review for the atrial fib Wasn't on event monitor--taken off digoxin Will occasionally feel palpitations --like when quiet in bed (might be related to alcohol, if she has more than one---and she has thus cut down) No chest pain No SOB  Asthma has been controlled Occasional cough Montelukast seems to help  Chronic back pain Not that bad at present advil rarely--only for headache  Current Outpatient Prescriptions on File Prior to Visit  Medication Sig Dispense Refill  . ACCU-CHEK SMARTVIEW test strip TEST BLOOD SUGAR BID UTD  3  . albuterol (PROVENTIL HFA;VENTOLIN HFA) 108 (90 Base) MCG/ACT inhaler Inhale 2 puffs into the lungs every 6 (six) hours as needed. 18 g 0  . ALPRAZolam (XANAX) 0.5 MG tablet Take 1 tablet (0.5 mg total) by mouth 2 (two) times daily as needed. 100 tablet 0  . aspirin 81 MG tablet Take 81 mg by mouth daily.      Marland Kitchen. atorvastatin (LIPITOR) 20 MG tablet TAKE 1 TABLET BY MOUTH EVERY DAY 90 tablet 1  . B-D ULTRA-FINE 33 LANCETS MISC Use to test blood sugar twice daily dx: E11.9 200 each 3  .  calcitonin, salmon, (MIACALCIN/FORTICAL) 200 UNIT/ACT nasal spray USE 1 SPRAY IN THE NOSE EVERY DAY(ALTERNATING NOSTRILS) 3.7 mL 11  . CALCIUM-VITAMIN D PO Take 1 tablet by mouth daily.      . cetirizine (ZYRTEC) 1 MG/ML syrup Take 10 mg by mouth daily.    . Chromium 100 MCG TABS Take by mouth daily.      . Coenzyme Q10 200 MG capsule Take 200 mg by mouth daily.      Marland Kitchen. EPINEPHrine 0.3 mg/0.3 mL IJ SOAJ injection Inject 0.3 mg into the muscle once.    . fish oil-omega-3 fatty acids 1000 MG capsule Take 1 g by mouth daily.      . fluticasone (FLONASE) 50 MCG/ACT nasal spray Place 2 sprays into both nostrils daily.    . folic acid (FOLVITE) 1 MG tablet Take 1 mg by mouth daily.      . Insulin Glargine (LANTUS SOLOSTAR) 100 UNIT/ML Solostar Pen INJECT 28 UNITS UNDER THE SKIN EVERY MORNING AND 16 UNITS EVERY EVENING AS DIRECTED    . Insulin Pen Needle (B-D UF III MINI PEN NEEDLES) 31G X 5 MM MISC Use to inject insulin four times daily as directed dx: E11.9 200 each 3  . levothyroxine (SYNTHROID, LEVOTHROID) 100 MCG tablet Take 100 mcg by mouth daily before breakfast.    . meclizine (ANTIVERT) 25 MG tablet TAKE 1 TABLET BY MOUTH THREE TIMES DAILY AS NEEDED 90 tablet 1  . metFORMIN (GLUCOPHAGE) 1000 MG tablet Take 1,000  mg by mouth daily with breakfast. TAKING 1 IN THE MORNING AND 2 IN EVENING    . metoprolol succinate (TOPROL-XL) 25 MG 24 hr tablet Take 1 tablet (25 mg total) by mouth daily. 90 tablet 3  . montelukast (SINGULAIR) 10 MG tablet TAKE 1 TABLET BY MOUTH EVERY NIGHT AT BEDTIME 90 tablet 0  . Multiple Vitamin (MULTIVITAMIN) tablet Take 1 tablet by mouth daily.    Marland Kitchen omeprazole (PRILOSEC) 20 MG capsule TAKE 1 CAPSULE BY MOUTH TWICE DAILY BEFORE A MEAL 180 capsule 0  . vitamin B-12 (CYANOCOBALAMIN) 1000 MCG tablet Take 1,000 mcg by mouth daily.    . vitamin C (ASCORBIC ACID) 500 MG tablet Take 500 mg by mouth daily.     No current facility-administered medications on file prior to visit.      Allergies  Allergen Reactions  . Cefdinir     REACTION: unspecified  . Ciprofloxacin     rash  . Clarithromycin     REACTION: weird dreams (on prednisone at the same time but has tolerated this in the past)  . Miglitol     REACTION: unspecified  . Niacin     REACTION: unspecified  . Qvar [Beclomethasone] Other (See Comments)    Feels funny Feels funny    Past Medical History:  Diagnosis Date  . Allergy   . Asthma   . Atrial fibrillation (HCC)   . Diabetes mellitus   . GERD (gastroesophageal reflux disease)   . Hyperlipidemia   . Osteopenia   . Sleep disorder   . Thyroid disease     Past Surgical History:  Procedure Laterality Date  . CERVICAL CONE BIOPSY  1960's  . CHOLECYSTECTOMY    . NASAL POLYP SURGERY  01/01  . STRABISMUS SURGERY  1946 / 69  . VAGINAL DELIVERY     x2    Family History  Problem Relation Age of Onset  . Asthma Mother   . Coronary artery disease Father     Social History   Social History  . Marital status: Married    Spouse name: N/A  . Number of children: 2  . Years of education: N/A   Occupational History  .  Retired   Social History Main Topics  . Smoking status: Never Smoker  . Smokeless tobacco: Never Used  . Alcohol use 0.0 oz/week     Comment: 1 glass of wine per day  . Drug use: No  . Sexual activity: Not on file   Other Topics Concern  . Not on file   Social History Narrative   Has living will   Not sure about formal POA --but requests husband, then daughter [name] (and son [name]) to be health care POAs   Would accept resuscitation attempts   Not sure about feeding tubes   Review of Systems Usually sleeps okay--sometimes husband disturbs her Appetite is okay Weight fairly stable Has disc problem in C3---occasional nerve symptoms in her hands (like if lies the wrong way--into right hand) Wears seat belt Teeth are okay--keeps up with dentist  Bowels are okay--occasional loose stool from metformin.  Imodium helps then. No blood Voids okay--stream is slower. No incontinence. Has spot behind ears she wants checked-- no regular derm visits    Objective:   Physical Exam  Constitutional: She is oriented to person, place, and time. She appears well-nourished. No distress.  HENT:  Mouth/Throat: Oropharynx is clear and moist. No oropharyngeal exudate.  Neck: No thyromegaly present.  Cardiovascular: Normal rate, regular  rhythm, normal heart sounds and intact distal pulses.  Exam reveals no gallop.   No murmur heard. Pulmonary/Chest: Effort normal and breath sounds normal. No respiratory distress. She has no wheezes. She has no rales.  Abdominal: Soft. There is no tenderness.  Musculoskeletal: She exhibits no edema or tenderness.  Lymphadenopathy:    She has no cervical adenopathy.  Neurological: She is alert and oriented to person, place, and time.  President-- "Garnet Koyanagi, Obama, Bush" [phone] [id number] Recall 3/3  Skin: No rash noted. No erythema.  No foot lesions  Psychiatric: She has a normal mood and affect. Her behavior is normal.          Assessment & Plan:

## 2017-02-02 NOTE — Assessment & Plan Note (Signed)
I have personally reviewed the Medicare Annual Wellness questionnaire and have noted 1. The patient's medical and social history 2. Their use of alcohol, tobacco or illicit drugs 3. Their current medications and supplements 4. The patient's functional ability including ADL's, fall risks, home safety risks and hearing or visual             impairment. 5. Diet and physical activities 6. Evidence for depression or mood disorders  The patients weight, height, BMI and visual acuity have been recorded in the chart I have made referrals, counseling and provided education to the patient based review of the above and I have provided the pt with a written personalized care plan for preventive services.  I have provided you with a copy of your personalized plan for preventive services. Please take the time to review along with your updated medication list. No cancer screening due to age UTD on vaccines Discussed exercise

## 2017-02-02 NOTE — Assessment & Plan Note (Signed)
Mild anxiety Alprazolam mostly at bedtime

## 2017-02-06 DIAGNOSIS — H00012 Hordeolum externum right lower eyelid: Secondary | ICD-10-CM | POA: Diagnosis not present

## 2017-02-20 DIAGNOSIS — H00011 Hordeolum externum right upper eyelid: Secondary | ICD-10-CM | POA: Diagnosis not present

## 2017-03-20 ENCOUNTER — Other Ambulatory Visit: Payer: Self-pay | Admitting: Internal Medicine

## 2017-03-20 NOTE — Telephone Encounter (Signed)
Received refill electronically Last refill 08/15/16 #100 Last office visit 02/02/17

## 2017-03-21 NOTE — Telephone Encounter (Signed)
Approved: #100 x 0

## 2017-03-21 NOTE — Telephone Encounter (Signed)
Left refill on voice mail at pharmacy  

## 2017-03-28 DIAGNOSIS — Z794 Long term (current) use of insulin: Secondary | ICD-10-CM | POA: Diagnosis not present

## 2017-03-28 DIAGNOSIS — E538 Deficiency of other specified B group vitamins: Secondary | ICD-10-CM | POA: Diagnosis not present

## 2017-03-28 DIAGNOSIS — E039 Hypothyroidism, unspecified: Secondary | ICD-10-CM | POA: Diagnosis not present

## 2017-03-28 DIAGNOSIS — E11649 Type 2 diabetes mellitus with hypoglycemia without coma: Secondary | ICD-10-CM | POA: Diagnosis not present

## 2017-04-22 DIAGNOSIS — I700 Atherosclerosis of aorta: Secondary | ICD-10-CM | POA: Insufficient documentation

## 2017-04-22 NOTE — Progress Notes (Signed)
Cardiology Office Note  Date:  04/24/2017   ID:  [name], DOB 03-21-1931, MRN [id number]  PCP:  Hannah SchwalbeLetvak, Richard I, MD   Chief Complaint  Patient presents with  . other    over due 6 month follow up. Patient c/o weight gain. Meds reviewed verbally with patient.     HPI:  [name] is a 83 y.o. female with a hx of  paroxysmal atrial fibrillation. diagnosed 23 years ago. ("heart was feeling really weird") chadsvasc 4. HTN Hyperlipidemia DM II, followed by Dr. Renae FicklePaul, HBA1C 7.2  on metoprolol and digoxin.  Echo 06/2016: Normal EF >55%, normal LA size Event monitor 06/2016: NSR, no atrial fib  episode of chest pain, in the ED 07/2016  Long discussion with her concerning her history of atrial fibrillation We do not have any documentation of her atrial fibrillation for more than 20 years ago She did have palpitations September 2017, went to the hospital at that time No arrhythmia noted Follow-up event monitor did not show arrhythmia CHADS VASC 5 if she is in fact having atrial fibrillation She is on aspirin  CT chest  2015: Atherosclerotic calcification aorta and minimally in coronary arteries.  EKG personally reviewed by myself on todays visit Shows normal sinus rhythm with rate 76 bpm no significant ST or T-wave changes   PMH:   has a past medical history of Allergy; Asthma; Atrial fibrillation (HCC); Diabetes mellitus; GERD (gastroesophageal reflux disease); Hyperlipidemia; Osteopenia; Sleep disorder; and Thyroid disease.  PSH:    Past Surgical History:  Procedure Laterality Date  . CERVICAL CONE BIOPSY  1960's  . CHOLECYSTECTOMY    . NASAL POLYP SURGERY  01/01  . STRABISMUS SURGERY  1946 / [id number]  . VAGINAL DELIVERY     x2    Current Outpatient Prescriptions  Medication Sig Dispense Refill  . ACCU-CHEK SMARTVIEW test strip TEST BLOOD SUGAR BID UTD  3  . albuterol (PROVENTIL HFA;VENTOLIN HFA) 108 (90 Base) MCG/ACT inhaler Inhale 2 puffs into the lungs every  6 (six) hours as needed. 18 g 0  . ALPRAZolam (XANAX) 0.5 MG tablet TAKE 1 TABLET BY MOUTH TWICE DAILY AS NEEDED 100 tablet 0  . aspirin 81 MG tablet Take 81 mg by mouth daily.      Marland Kitchen. atorvastatin (LIPITOR) 20 MG tablet TAKE 1 TABLET BY MOUTH EVERY DAY 90 tablet 1  . B-D ULTRA-FINE 33 LANCETS MISC Use to test blood sugar twice daily dx: E11.9 200 each 3  . calcitonin, salmon, (MIACALCIN/FORTICAL) 200 UNIT/ACT nasal spray USE 1 SPRAY IN THE NOSE EVERY DAY(ALTERNATING NOSTRILS) 3.7 mL 11  . CALCIUM-VITAMIN D PO Take 1 tablet by mouth daily.      . cetirizine (ZYRTEC) 1 MG/ML syrup Take 10 mg by mouth daily.    . Chromium 100 MCG TABS Take by mouth daily.      . Coenzyme Q10 200 MG capsule Take 200 mg by mouth daily.      . fish oil-omega-3 fatty acids 1000 MG capsule Take 1 g by mouth daily.      . fluticasone (FLONASE) 50 MCG/ACT nasal spray Place 2 sprays into both nostrils daily.    . folic acid (FOLVITE) 1 MG tablet Take 1 mg by mouth daily.      . Insulin Glargine (LANTUS SOLOSTAR) 100 UNIT/ML Solostar Pen INJECT 28 UNITS UNDER THE SKIN EVERY MORNING AND 16 UNITS EVERY EVENING AS DIRECTED    . Insulin Pen Needle (B-D UF III MINI PEN  NEEDLES) 31G X 5 MM MISC Use to inject insulin four times daily as directed dx: E11.9 200 each 3  . levothyroxine (SYNTHROID, LEVOTHROID) 100 MCG tablet Take 100 mcg by mouth daily before breakfast.    . meclizine (ANTIVERT) 25 MG tablet TAKE 1 TABLET BY MOUTH THREE TIMES DAILY AS NEEDED 90 tablet 1  . metFORMIN (GLUCOPHAGE) 1000 MG tablet Take 1,000 mg by mouth daily with breakfast. TAKING 1 IN THE MORNING AND 2 IN EVENING    . metoprolol succinate (TOPROL-XL) 25 MG 24 hr tablet Take 1 tablet (25 mg total) by mouth daily. 90 tablet 3  . montelukast (SINGULAIR) 10 MG tablet TAKE 1 TABLET BY MOUTH EVERY NIGHT AT BEDTIME 90 tablet 0  . Multiple Vitamin (MULTIVITAMIN) tablet Take 1 tablet by mouth daily.    Marland Kitchen omeprazole (PRILOSEC) 20 MG capsule TAKE 1 CAPSULE BY  MOUTH TWICE DAILY BEFORE A MEAL 180 capsule 0  . vitamin B-12 (CYANOCOBALAMIN) 1000 MCG tablet Take 1,000 mcg by mouth daily.    . vitamin C (ASCORBIC ACID) 500 MG tablet Take 500 mg by mouth daily.     No current facility-administered medications for this visit.      Allergies:   Cefdinir; Ciprofloxacin; Clarithromycin; Miglitol; Niacin; and Qvar [beclomethasone]   Social History:  The patient  reports that she has never smoked. She has never used smokeless tobacco. She reports that she drinks alcohol. She reports that she does not use drugs.   Family History:   family history includes Asthma in her mother; Coronary artery disease in her father.    Review of Systems: Review of Systems  Constitutional: Negative.   Respiratory: Negative.   Cardiovascular: Negative.   Gastrointestinal: Negative.   Musculoskeletal: Negative.   Neurological: Negative.   Psychiatric/Behavioral: Negative.   All other systems reviewed and are negative.    PHYSICAL EXAM: VS:  BP (!) 152/60 (BP Location: Left Arm, Patient Position: Sitting, Cuff Size: Normal)   Pulse 76   Ht 5\' 3"  (1.6 m)   Wt 162 lb 4 oz (73.6 kg)   BMI 28.74 kg/m  , BMI Body mass index is 28.74 kg/m. GEN: Well nourished, well developed, in no acute distress  HEENT: normal  Neck: no JVD, carotid bruits, or masses Cardiac: RRR; no murmurs, rubs, or gallops,no edema  Respiratory:  clear to auscultation bilaterally, normal work of breathing GI: soft, nontender, nondistended, + BS MS: no deformity or atrophy  Skin: warm and dry, no rash Neuro:  Strength and sensation are intact Psych: euthymic mood, full affect    Recent Labs: 06/23/2016: ALT 27; TSH 3.320 07/17/2016: BUN 19; Creatinine, Ser 0.61; Hemoglobin 13.4; Magnesium 1.6; Platelets 204; Potassium 4.2; Sodium 137    Lipid Panel Lab Results  Component Value Date   CHOL 100 06/23/2016   HDL 44 06/23/2016   LDLCALC 18 06/23/2016   TRIG 190 (H) 06/23/2016      Wt  Readings from Last 3 Encounters:  04/24/17 162 lb 4 oz (73.6 kg)  02/02/17 161 lb (73 kg)  10/21/16 158 lb (71.7 kg)       ASSESSMENT AND PLAN:  Paroxysmal atrial fibrillation (HCC) No documentation of atrial fibrillation as above Long discussion concerning arrhythmia, risk and benefit of blood thinners Recommended she monitor her heart rate at home closely looking for arrhythmia If she has any symptoms, suggested she call our office We will not start anticoagulation at this time given no documentation  Essential hypertension Suggested she monitor blood  pressure at home and call us if numbers continue to run high  Mixed hyperlipidemia Cholesterol is at goal on the current lipid regimen. No changes to the medications were made.  Aortic atherosclerosis (HCC) Seen on CT scan   Total encounter time more than 25 minutes  Greater than 50% was spent in counseling and coordination of care with the patient   Disposition:   F/U  6 months  No orders of the defined types were placed in this encounter.    Signed, Dossie Arbour, M.D., Ph.D. 04/24/2017  [facility] [place], [place] [phone]

## 2017-04-24 ENCOUNTER — Encounter: Payer: Self-pay | Admitting: Cardiovascular Disease

## 2017-04-24 ENCOUNTER — Ambulatory Visit (INDEPENDENT_AMBULATORY_CARE_PROVIDER_SITE_OTHER): Payer: Medicare PPO | Admitting: Cardiovascular Disease

## 2017-04-24 VITALS — BP 152/60 | HR 76 | Ht 63.0 in | Wt 162.2 lb

## 2017-04-24 DIAGNOSIS — I700 Atherosclerosis of aorta: Secondary | ICD-10-CM | POA: Diagnosis not present

## 2017-04-24 DIAGNOSIS — E782 Mixed hyperlipidemia: Secondary | ICD-10-CM

## 2017-04-24 DIAGNOSIS — I480 Paroxysmal atrial fibrillation: Secondary | ICD-10-CM | POA: Diagnosis not present

## 2017-04-24 DIAGNOSIS — I10 Essential (primary) hypertension: Secondary | ICD-10-CM | POA: Diagnosis not present

## 2017-04-24 NOTE — Patient Instructions (Addendum)
Please monitor your heart rhythm at home Call the office for irregular rhythm  Medication Instructions:   No medication changes made  Labwork:  No new labs needed  Testing/Procedures:  No further testing at this time   I recommend watching educational videos on topics of interest to you at:       [email or web address]  Enter code: HEARTCARE    Follow-Up: It was a pleasure seeing you in the office today. Please call us if you have new issues that need to be addressed before your next appt.  [phone]  Your physician wants you to follow-up in: 12 months.  You will receive a reminder letter in the mail two months in advance. If you don't receive a letter, please call our office to schedule the follow-up appointment.  If you need a refill on your cardiac medications before your next appointment, please call your pharmacy.

## 2017-04-27 DIAGNOSIS — H6123 Impacted cerumen, bilateral: Secondary | ICD-10-CM | POA: Diagnosis not present

## 2017-04-27 DIAGNOSIS — H903 Sensorineural hearing loss, bilateral: Secondary | ICD-10-CM | POA: Diagnosis not present

## 2017-05-20 ENCOUNTER — Other Ambulatory Visit: Payer: Self-pay | Admitting: Internal Medicine

## 2017-05-22 ENCOUNTER — Other Ambulatory Visit: Payer: Self-pay | Admitting: Internal Medicine

## 2017-06-03 ENCOUNTER — Ambulatory Visit (INDEPENDENT_AMBULATORY_CARE_PROVIDER_SITE_OTHER): Payer: Medicare PPO | Admitting: Family Medicine

## 2017-06-03 ENCOUNTER — Encounter: Payer: Self-pay | Admitting: Family Medicine

## 2017-06-03 VITALS — BP 120/72 | HR 77 | Temp 98.1°F | Wt 166.0 lb

## 2017-06-03 DIAGNOSIS — B354 Tinea corporis: Secondary | ICD-10-CM | POA: Diagnosis not present

## 2017-06-03 MED ORDER — KETOCONAZOLE 2 % EX CREA: 1.0000 "application " | TOPICAL_CREAM | Freq: Every day | CUTANEOUS | 1 refills | Status: DC

## 2017-06-03 NOTE — Progress Notes (Signed)
Subjective:    Patient ID: [name], female    DOB: [birth year], 83 y.o.   MRN: [id number]  HPI Here with ? Tick bites  Noticed yesterday  Itching under her R arm - saw 3 circular ring marks  Now getting more red  No bite marks / scabs  No ticks that you saw   She is not outdoors a lot but she brings things in from outside all the time   No animals in the house   No fever or other new symptoms    Patient Active Problem List   Diagnosis Date Noted  . Tinea corporis 06/03/2017  . Aortic atherosclerosis (HCC) 04/22/2017  . Advance directive discussed with patient 02/02/2017  . Esophageal dysphagia 10/21/2016  . Palpitations 07/25/2016  . Cough 07/13/2015  . Mild intermittent asthma with exacerbation 09/08/2014  . Asthma, chronic 10/15/2013  . Routine general medical examination at a health care facility 03/19/2012  . Diabetes mellitus type 2, uncontrolled, without complications (HCC) 01/16/2012  . Episodic mood disorder (HCC) 07/26/2010  . BACK PAIN, LUMBAR, WITH RADICULOPATHY 06/30/2009  . SLEEP DISORDER 11/12/2008  . Actinic keratosis 06/18/2008  . Essential hypertension 02/06/2008  . Hypothyroidism 01/12/2007  . Mixed hyperlipidemia 01/12/2007  . Paroxysmal atrial fibrillation (HCC) 01/12/2007  . Allergic rhinitis 01/12/2007  . GERD 01/12/2007  . OSTEOPENIA 01/12/2007   Past Medical History:  Diagnosis Date  . Allergy   . Asthma   . Atrial fibrillation (HCC)   . Diabetes mellitus   . GERD (gastroesophageal reflux disease)   . Hyperlipidemia   . Osteopenia   . Sleep disorder   . Thyroid disease    Past Surgical History:  Procedure Laterality Date  . CERVICAL CONE BIOPSY  1960's  . CHOLECYSTECTOMY    . NASAL POLYP SURGERY  01/01  . STRABISMUS SURGERY  1946 / 31  . VAGINAL DELIVERY     x2   Social History  Substance Use Topics  . Smoking status: Never Smoker  . Smokeless tobacco: Never Used  . Alcohol use 0.0 oz/week     Comment: 1 glass of  wine per day   Family History  Problem Relation Age of Onset  . Asthma Mother   . Coronary artery disease Father    Allergies  Allergen Reactions  . Cefdinir     REACTION: unspecified  . Ciprofloxacin     rash  . Clarithromycin     REACTION: weird dreams (on prednisone at the same time but has tolerated this in the past)  . Miglitol     REACTION: unspecified  . Niacin     REACTION: unspecified  . Qvar [Beclomethasone] Other (See Comments)    Feels funny Feels funny   Current Outpatient Prescriptions on File Prior to Visit  Medication Sig Dispense Refill  . ACCU-CHEK SMARTVIEW test strip TEST BLOOD SUGAR BID UTD  3  . albuterol (PROVENTIL HFA;VENTOLIN HFA) 108 (90 Base) MCG/ACT inhaler Inhale 2 puffs into the lungs every 6 (six) hours as needed. 18 g 0  . ALPRAZolam (XANAX) 0.5 MG tablet TAKE 1 TABLET BY MOUTH TWICE DAILY AS NEEDED 100 tablet 0  . aspirin 81 MG tablet Take 81 mg by mouth daily.      Marland Kitchen atorvastatin (LIPITOR) 20 MG tablet TAKE 1 TABLET BY MOUTH EVERY DAY 90 tablet 1  . B-D ULTRA-FINE 33 LANCETS MISC Use to test blood sugar twice daily dx: E11.9 200 each 3  . calcitonin, salmon, (MIACALCIN/FORTICAL) 200 UNIT/ACT  nasal spray USE 1 SPRAY IN THE NOSE EVERY DAY(ALTERNATING NOSTRILS) 3.7 mL 11  . calcitonin, salmon, (MIACALCIN/FORTICAL) 200 UNIT/ACT nasal spray USE 1 SPRAY IN THE NOSE EVERY DAY(ALTERNATING NOSTRILS) 3.7 mL 11  . CALCIUM-VITAMIN D PO Take 1 tablet by mouth daily.      . cetirizine (ZYRTEC) 1 MG/ML syrup Take 10 mg by mouth daily.    . Chromium 100 MCG TABS Take by mouth daily.      . Coenzyme Q10 200 MG capsule Take 200 mg by mouth daily.      . fish oil-omega-3 fatty acids 1000 MG capsule Take 1 g by mouth daily.      . fluticasone (FLONASE) 50 MCG/ACT nasal spray Place 2 sprays into both nostrils daily.    . folic acid (FOLVITE) 1 MG tablet Take 1 mg by mouth daily.      . Insulin Glargine (LANTUS SOLOSTAR) 100 UNIT/ML Solostar Pen INJECT 28 UNITS  UNDER THE SKIN EVERY MORNING AND 16 UNITS EVERY EVENING AS DIRECTED    . Insulin Pen Needle (B-D UF III MINI PEN NEEDLES) 31G X 5 MM MISC Use to inject insulin four times daily as directed dx: E11.9 200 each 3  . levothyroxine (SYNTHROID, LEVOTHROID) 100 MCG tablet Take 100 mcg by mouth daily before breakfast.    . meclizine (ANTIVERT) 25 MG tablet TAKE 1 TABLET BY MOUTH THREE TIMES DAILY AS NEEDED 90 tablet 1  . metFORMIN (GLUCOPHAGE) 1000 MG tablet Take 1,000 mg by mouth daily with breakfast. TAKING 1 IN THE MORNING AND 2 IN EVENING    . metoprolol succinate (TOPROL-XL) 25 MG 24 hr tablet TAKE 1 TABLET(25 MG) BY MOUTH DAILY 90 tablet 3  . montelukast (SINGULAIR) 10 MG tablet TAKE 1 TABLET BY MOUTH EVERY NIGHT AT BEDTIME 90 tablet 0  . Multiple Vitamin (MULTIVITAMIN) tablet Take 1 tablet by mouth daily.    Marland Kitchen omeprazole (PRILOSEC) 20 MG capsule TAKE 1 CAPSULE BY MOUTH TWICE DAILY BEFORE A MEAL 180 capsule 0  . vitamin B-12 (CYANOCOBALAMIN) 1000 MCG tablet Take 1,000 mcg by mouth daily.    . vitamin C (ASCORBIC ACID) 500 MG tablet Take 500 mg by mouth daily.     No current facility-administered medications on file prior to visit.     Review of Systems Review of Systems  Constitutional: Negative for fever, appetite change, fatigue and unexpected weight change.  Eyes: Negative for pain and visual disturbance.  Respiratory: Negative for cough and shortness of breath.   Cardiovascular: Negative for cp or palpitations    Gastrointestinal: Negative for nausea, diarrhea and constipation.  Genitourinary: Negative for urgency and frequency.  Skin: Negative for pallor and pos for skin lesions in axilla w/o other rash   MSK pos for chronic back pain  Neurological: Negative for weakness, light-headedness, numbness and headaches.  Hematological: Negative for adenopathy. Does not bruise/bleed easily.  Psychiatric/Behavioral: Negative for dysphoric mood. The patient is not nervous/anxious.           Objective:   Physical Exam  Constitutional: She appears well-developed and well-nourished. No distress.  Well appearing   HENT:  Head: Normocephalic and atraumatic.  Eyes: Pupils are equal, round, and reactive to light. Conjunctivae and EOM are normal. No scleral icterus.  Neck: Normal range of motion. Neck supple.  Cardiovascular: Normal rate.   irreg irreg rhythm  Pulmonary/Chest: Effort normal and breath sounds normal. No respiratory distress. She has no wheezes. She has no rales.  Lymphadenopathy:    She  has no cervical adenopathy.  Neurological: She is alert.  Skin: Skin is warm and dry. Rash noted.  3 areas of oval ring shaped lesions in R axilla  2-3 cm/ central clearing  No excoriation No signs of insect bites   No vesicles  No rash  Psychiatric: She has a normal mood and affect.          Assessment & Plan:   Problem List Items Addressed This Visit      Musculoskeletal and Integument   Tinea corporis    3 areas of ringworm/tinea seen in R axilla (not consistent with tick bites) tx with ketoconazole cream 2% daily  Keep dry  Disc symptomatic care - see instructions on AVS  Update if not starting to improve in a week or if worsening        Relevant Medications   ketoconazole (NIZORAL) 2 % cream

## 2017-06-03 NOTE — Assessment & Plan Note (Signed)
3 areas of ringworm/tinea seen in R axilla (not consistent with tick bites) tx with ketoconazole cream 2% daily  Keep dry  Disc symptomatic care - see instructions on AVS  Update if not starting to improve in a week or if worsening

## 2017-06-03 NOTE — Patient Instructions (Signed)
Try the ketoconazole for fungal skin infection (ringworm) Keep area dry  Keep cool also    Update if not starting to improve in several weeks or if worsening

## 2017-06-05 ENCOUNTER — Telehealth: Payer: Self-pay | Admitting: *Deleted

## 2017-06-05 NOTE — Telephone Encounter (Signed)
Did she make an appt? 

## 2017-06-05 NOTE — Telephone Encounter (Signed)
[facility] - Client TELEPHONE ADVICE RECORD [facility] Patient Name: Hannah BrashRMGARD Everitt Gender: Female DOB: 09-Feb-1931 Age: [age] Y 4 M 4 D Return Phone Number: [phone] (Primary), [phone] (Secondary) [place]State[place]Zip: [place] [id number] Client [facility] - Client Client Site [facility] - Day Physician Tillman AbideLetvak, Richard - MD Who Is Calling Patient / Member / Family / Caregiver Call Type Triage / Clinical Caller Name Thomasene MohairMelinda Shorkey Relationship To Patient Daughter Return Phone Number [phone](336) [phone] (Primary) Chief Complaint Tick Bite Reason for Call Symptomatic / Request for Health Information Initial Comment Caller states that her mother has a place under her arm. Complaining of itching. Says has a big red ring around it. Concerned and not sure what it could be. Appointment Disposition EMR Appointment Attempted - Not Scheduled Info pasted into Epic No Nurse Assessment Nurse: Terrence DupontMcClarnon, RN, Jeanice LimHolly Date/Time (Eastern Time): 06/02/2017 5:12:27 PM Confirm and document reason for call. If symptomatic, describe symptoms. ---Caller states that her mother has 3 places under her rt arm. Says has a big red ring around it. Concerned and not sure what it could be. Felt like a little mole but just a red circles. no tick attached Does the PT have any chronic conditions? (i.e. diabetes, asthma, etc.) ---Yes List chronic conditions. ---diabetes, asthma, high blood pressure Guidelines Guideline Title Affirmed Question Insect Bite All other insect bites Disp. Time Lamount Cohen(Eastern Time) Disposition Final User 06/02/2017 5:25:42 PM See Physician within 24 Hours Yes McClarnon, RN, [facility] Care Advice Given Per Guideline HOME CARE: You should be able to treat this at home. REASSURANCE: * It sounds like a normal reaction to an insect bite. SEE PHYSICIAN WITHIN 24  HOURS: * IF OFFICE WILL BE CLOSED AND NO PCP TRIAGE: You need to be seen within the next 24 hours. An urgent care center is often a good source of care if your doctor's office is closed. ANTIBIOTIC OINTMENT: Apply an antibiotic ointment (OTC) 4 times per day. CALL BACK IF: * Fever occurs * You become worse. CARE ADVICE given per Insect Bite (Adult) guideline. PLEASE

## 2017-06-05 NOTE — Telephone Encounter (Signed)
No appt has been made. Tried to call pt. No answer. Left a message to call the office.

## 2017-06-05 NOTE — Telephone Encounter (Signed)
Patient returned Shannon's call.  Patient saw Dr.Tower on Saturday and she was diagnosed with ring worm.

## 2017-06-09 ENCOUNTER — Ambulatory Visit (INDEPENDENT_AMBULATORY_CARE_PROVIDER_SITE_OTHER): Payer: Medicare PPO | Admitting: Family Medicine

## 2017-06-09 ENCOUNTER — Encounter: Payer: Self-pay | Admitting: Family Medicine

## 2017-06-09 DIAGNOSIS — B354 Tinea corporis: Secondary | ICD-10-CM | POA: Diagnosis not present

## 2017-06-09 NOTE — Progress Notes (Signed)
   Subjective:    Patient ID: [name], female    DOB: 1931-03-29, 83 y.o.   MRN: [id number]  HPI   83 year old female pt of Dr. Alphonsus SiasLetvak presents with continue rash in axillae.  She was seen by Dr. Milinda Antisower on 06/03/2017 and was dx with ringworm. Treated with ketoconazole cream 2 % daily.   Today she reports  There has been minimal improvement, no bigger.  not itchy, no blisters, no pustules. Does seem to fade when she places the cream  Was proceeded by possible itchy bite 2 weeks ago.. Then noted the rings.  Never saw an insect.  She is very concerned it may be a tick bite.. Never saw a tick.  no neck stiffness, slight headache, no new joint joint pain, no fever, no new neurologic symptoms.   Review of Systems  Constitutional: Negative for fatigue and fever.  HENT: Negative for ear pain.   Eyes: Negative for pain.  Respiratory: Negative for chest tightness and shortness of breath.   Cardiovascular: Negative for chest pain, palpitations and leg swelling.  Gastrointestinal: Negative for abdominal pain.  Genitourinary: Negative for dysuria.       Objective:   Physical Exam  Constitutional: Vital signs are normal. She appears well-developed and well-nourished. She is cooperative.  Non-toxic appearance. She does not appear ill. No distress.  HENT:  Head: Normocephalic.  Right Ear: Hearing, tympanic membrane, external ear and ear canal normal. Tympanic membrane is not erythematous, not retracted and not bulging.  Left Ear: Hearing, tympanic membrane, external ear and ear canal normal. Tympanic membrane is not erythematous, not retracted and not bulging.  Nose: No mucosal edema or rhinorrhea. Right sinus exhibits no maxillary sinus tenderness and no frontal sinus tenderness. Left sinus exhibits no maxillary sinus tenderness and no frontal sinus tenderness.  Mouth/Throat: Uvula is midline, oropharynx is clear and moist and mucous membranes are normal.  Eyes: Pupils are equal, round,  and reactive to light. Conjunctivae, EOM and lids are normal. Lids are everted and swept, no foreign bodies found.  Neck: Trachea normal and normal range of motion. Neck supple. Carotid bruit is not present. No thyroid mass and no thyromegaly present.  Cardiovascular: Normal rate, regular rhythm, S1 normal, S2 normal, normal heart sounds, intact distal pulses and normal pulses.  Exam reveals no gallop and no friction rub.   No murmur heard. Pulmonary/Chest: Effort normal and breath sounds normal. No tachypnea. No respiratory distress. She has no decreased breath sounds. She has no wheezes. She has no rhonchi. She has no rales.  Abdominal: Soft. Normal appearance and bowel sounds are normal. There is no tenderness.  Neurological: She is alert.  Skin: Skin is warm, dry and intact. No rash noted.  3 cm well circumscribed round lesion next two 2 larger confluent circular/semicircular lesion 8 cm across in right axillae, leading edge, central clearing , no bite.  Psychiatric: Her speech is normal and behavior is normal. Judgment and thought content normal. Her mood appears not anxious. Cognition and memory are normal. She does not exhibit a depressed mood.          Assessment & Plan:

## 2017-06-09 NOTE — Assessment & Plan Note (Signed)
Reassured pt.. Agree with dx of ringworm.  Increase frequency of application of ketoconazole.. But pt reassure it will take 2-3 weeks for resolution. Already appears to be fading.  Not consistent with lyme infection or lyme rash. If pt remains concerned.. Return at the 2 week point for antibody check.

## 2017-06-09 NOTE — Patient Instructions (Addendum)
Increase ketoconazole cream to twice daily.. But it may 2-3 more week to resolve. Call if any fever, joint pain headache or neck stiffness more than normal.

## 2017-07-24 ENCOUNTER — Encounter: Payer: Self-pay | Admitting: Internal Medicine

## 2017-07-24 ENCOUNTER — Ambulatory Visit (INDEPENDENT_AMBULATORY_CARE_PROVIDER_SITE_OTHER): Payer: Medicare PPO | Admitting: Internal Medicine

## 2017-07-24 VITALS — BP 128/72 | HR 69 | Temp 98.0°F | Wt 165.8 lb

## 2017-07-24 DIAGNOSIS — K219 Gastro-esophageal reflux disease without esophagitis: Secondary | ICD-10-CM

## 2017-07-24 DIAGNOSIS — B354 Tinea corporis: Secondary | ICD-10-CM | POA: Diagnosis not present

## 2017-07-24 NOTE — Patient Instructions (Signed)
Please resume the omeprazole twice a day---on empty stomach before you get out of bed and at night before going to sleep. If you are feeling better in 2-3 weeks, you can cut back to once a day (at bedtime)

## 2017-07-24 NOTE — Assessment & Plan Note (Signed)
Her symptoms do seem to be reflux related Reminded her she needs to take the PPI on empty stomach Will increase to bid for now as well No evidence of pulmonary process No gallbladder and doesn't seem intestinal Doubt choledocholithiasis given the way the symptoms are  Plan as above Would consider CT or ultrasound if symptoms worsen

## 2017-07-24 NOTE — Progress Notes (Signed)
Subjective:    Patient ID: [name], female    DOB: [birth year], 83 y.o.   MRN: [id number]  HPI Here with daughter due to abdominal problems  Has been "rearranging" my eating patterns Having acidy type pain in lower substernal area--and down to mid right abdomen and even into back Makes he think it is affecting her heart also Pinching RUQ pain also Notices it when lying down--like in evening and may be more after eating Eats dinner ~8PM and then lies down around midnight  Not drinking as much alcohol--no longer nightly wine Worried about an ulcer Taking omeprazole once a day in evening--but not on empty stomach lately Thinks some of her meds are bothering her on an empty stomach in evening  Current Outpatient Prescriptions on File Prior to Visit  Medication Sig Dispense Refill  . ACCU-CHEK SMARTVIEW test strip TEST BLOOD SUGAR BID UTD  3  . albuterol (PROVENTIL HFA;VENTOLIN HFA) 108 (90 Base) MCG/ACT inhaler Inhale 2 puffs into the lungs every 6 (six) hours as needed. 18 g 0  . ALPRAZolam (XANAX) 0.5 MG tablet TAKE 1 TABLET BY MOUTH TWICE DAILY AS NEEDED 100 tablet 0  . aspirin 81 MG tablet Take 81 mg by mouth daily.      Marland Kitchen. atorvastatin (LIPITOR) 20 MG tablet TAKE 1 TABLET BY MOUTH EVERY DAY 90 tablet 1  . B-D ULTRA-FINE 33 LANCETS MISC Use to test blood sugar twice daily dx: E11.9 200 each 3  . calcitonin, salmon, (MIACALCIN/FORTICAL) 200 UNIT/ACT nasal spray USE 1 SPRAY IN THE NOSE EVERY DAY(ALTERNATING NOSTRILS) 3.7 mL 11  . CALCIUM-VITAMIN D PO Take 1 tablet by mouth daily.      . cetirizine (ZYRTEC) 1 MG/ML syrup Take 10 mg by mouth daily.    . Chromium 100 MCG TABS Take by mouth daily.      . Coenzyme Q10 200 MG capsule Take 200 mg by mouth daily.      . fish oil-omega-3 fatty acids 1000 MG capsule Take 1 g by mouth daily.      . fluticasone (FLONASE) 50 MCG/ACT nasal spray Place 2 sprays into both nostrils daily.    . folic acid (FOLVITE) 1 MG tablet Take 1 mg by  mouth daily.      . Insulin Glargine (LANTUS SOLOSTAR) 100 UNIT/ML Solostar Pen INJECT 28 UNITS UNDER THE SKIN EVERY MORNING AND 16 UNITS EVERY EVENING AS DIRECTED    . Insulin Pen Needle (B-D UF III MINI PEN NEEDLES) 31G X 5 MM MISC Use to inject insulin four times daily as directed dx: E11.9 200 each 3  . ketoconazole (NIZORAL) 2 % cream Apply 1 application topically daily. To affected areas under arm 15 g 1  . levothyroxine (SYNTHROID, LEVOTHROID) 100 MCG tablet Take 100 mcg by mouth daily before breakfast.    . meclizine (ANTIVERT) 25 MG tablet TAKE 1 TABLET BY MOUTH THREE TIMES DAILY AS NEEDED 90 tablet 1  . metFORMIN (GLUCOPHAGE) 1000 MG tablet Take 1,000 mg by mouth daily with breakfast. TAKING 1 IN THE MORNING AND 2 IN EVENING    . metoprolol succinate (TOPROL-XL) 25 MG 24 hr tablet TAKE 1 TABLET(25 MG) BY MOUTH DAILY 90 tablet 3  . montelukast (SINGULAIR) 10 MG tablet TAKE 1 TABLET BY MOUTH EVERY NIGHT AT BEDTIME 90 tablet 0  . Multiple Vitamin (MULTIVITAMIN) tablet Take 1 tablet by mouth daily.    Marland Kitchen. omeprazole (PRILOSEC) 20 MG capsule TAKE 1 CAPSULE BY MOUTH TWICE DAILY BEFORE A  MEAL 180 capsule 0  . vitamin B-12 (CYANOCOBALAMIN) 1000 MCG tablet Take 1,000 mcg by mouth daily.    . vitamin C (ASCORBIC ACID) 500 MG tablet Take 500 mg by mouth daily.     No current facility-administered medications on file prior to visit.     Allergies  Allergen Reactions  . Cefdinir     REACTION: unspecified  . Ciprofloxacin     rash  . Clarithromycin     REACTION: weird dreams (on prednisone at the same time but has tolerated this in the past)  . Miglitol     REACTION: unspecified  . Niacin     REACTION: unspecified  . Qvar [Beclomethasone] Other (See Comments)    Feels funny Feels funny    Past Medical History:  Diagnosis Date  . Allergy   . Asthma   . Atrial fibrillation (HCC)   . Diabetes mellitus   . GERD (gastroesophageal reflux disease)   . Hyperlipidemia   . Osteopenia   .  Sleep disorder   . Thyroid disease     Past Surgical History:  Procedure Laterality Date  . CERVICAL CONE BIOPSY  1960's  . CHOLECYSTECTOMY    . NASAL POLYP SURGERY  01/01  . STRABISMUS SURGERY  1946 / 27  . VAGINAL DELIVERY     x2    Family History  Problem Relation Age of Onset  . Asthma Mother   . Coronary artery disease Father     Social History   Social History  . Marital status: Married    Spouse name: N/A  . Number of children: 2  . Years of education: N/A   Occupational History  .  Retired   Social History Main Topics  . Smoking status: Never Smoker  . Smokeless tobacco: Never Used  . Alcohol use 0.0 oz/week     Comment: 1 glass of wine per day  . Drug use: No  . Sexual activity: Not on file   Other Topics Concern  . Not on file   Social History Narrative   Has living will   Not sure about formal POA --but requests husband, then daughter [name] (and son [name]) to be health care POAs   Would accept resuscitation attempts   Not sure about feeding tubes   Review of Systems Some posterior neck pain also Has gained weight over the past year--~5# Worried about "ringworm" in right axilla---topical didn't really hep No N/V Appetite is fair--not as much as in past Bowels are still affected by the metformin---rarely takes 1/2 imodium No cough or SOB    Objective:   Physical Exam  Constitutional: No distress.  Neck: No thyromegaly present.  Cardiovascular: Normal rate, regular rhythm and normal heart sounds.  Exam reveals no gallop.   No murmur heard. Pulmonary/Chest: Effort normal and breath sounds normal. No respiratory distress. She has no wheezes. She has no rales.  Abdominal: Soft. She exhibits no distension. There is no rebound and no guarding.  Very slight epigastric tenderness  Musculoskeletal: She exhibits no edema.  Lymphadenopathy:    She has no cervical adenopathy.  Skin:  Slight hyperpigmentation in right axilla--serpiginous but not  raised (discussed that it seems to be resolving and hold off on other meds)          Assessment & Plan:

## 2017-07-24 NOTE — Assessment & Plan Note (Signed)
Has fading rash but doesn't seem active If worsens, will try fluconazole

## 2017-08-21 ENCOUNTER — Other Ambulatory Visit: Payer: Self-pay | Admitting: Internal Medicine

## 2017-08-22 ENCOUNTER — Ambulatory Visit: Payer: Medicare PPO

## 2017-09-01 DIAGNOSIS — L309 Dermatitis, unspecified: Secondary | ICD-10-CM | POA: Diagnosis not present

## 2017-09-01 DIAGNOSIS — L821 Other seborrheic keratosis: Secondary | ICD-10-CM | POA: Diagnosis not present

## 2017-09-01 DIAGNOSIS — L929 Granulomatous disorder of the skin and subcutaneous tissue, unspecified: Secondary | ICD-10-CM | POA: Diagnosis not present

## 2017-09-07 ENCOUNTER — Ambulatory Visit (INDEPENDENT_AMBULATORY_CARE_PROVIDER_SITE_OTHER): Payer: Medicare PPO

## 2017-09-07 DIAGNOSIS — Z23 Encounter for immunization: Secondary | ICD-10-CM

## 2017-09-19 DIAGNOSIS — Z794 Long term (current) use of insulin: Secondary | ICD-10-CM | POA: Diagnosis not present

## 2017-09-19 DIAGNOSIS — E11649 Type 2 diabetes mellitus with hypoglycemia without coma: Secondary | ICD-10-CM | POA: Diagnosis not present

## 2017-09-19 DIAGNOSIS — E039 Hypothyroidism, unspecified: Secondary | ICD-10-CM | POA: Diagnosis not present

## 2017-09-19 LAB — MICROALBUMIN, URINE: MICROALB UR: 4.6

## 2017-09-19 LAB — HEMOGLOBIN A1C: Hemoglobin A1C: 7.6

## 2017-10-03 DIAGNOSIS — E039 Hypothyroidism, unspecified: Secondary | ICD-10-CM | POA: Diagnosis not present

## 2017-10-03 DIAGNOSIS — E538 Deficiency of other specified B group vitamins: Secondary | ICD-10-CM | POA: Diagnosis not present

## 2017-10-03 DIAGNOSIS — E11649 Type 2 diabetes mellitus with hypoglycemia without coma: Secondary | ICD-10-CM | POA: Diagnosis not present

## 2017-10-03 DIAGNOSIS — Z794 Long term (current) use of insulin: Secondary | ICD-10-CM | POA: Diagnosis not present

## 2017-10-26 NOTE — Progress Notes (Signed)
Cardiology Office Note  Date:  10/27/2017   ID:  [name], DOB [birth year], MRN [id number]  PCP:  Karie SchwalbeLetvak, Richard I, MD   Chief Complaint  Patient presents with  . other    6 month f/u no complaints today. Meds reviewed verbally with pt.    HPI:  [name] is a 83 y.o. female with a hx of  paroxysmal atrial fibrillation. diagnosed 23 years ago. In hospital  ("heart was feeling really weird") chadsvasc 4. HTN Hyperlipidemia DM II, followed by Dr. Renae FicklePaul, HBA1C 7.2  on metoprolol and digoxin. Who presents for routine follow-up of her palpitations  Long discussion concerning previous history of paroxysmal atrial fibrillation,  episode over 20 years ago But no significant arrhythmia since that time She has rare palpitations at night, isolated beats, does not seem to bother her very much  Otherwise active, no regular exercise program No complaints of shortness of breath, leg swelling, chest pain Takes a nap during the day, feels she is doing well for [age] years old  EKG personally reviewed by myself on todays visit Shows normal sinus rhythm with rate 76 bpm no significant ST or T-wave changes  Previous records reviewed with her Echo 06/2016: Normal EF >55%, normal LA size Event monitor 06/2016: NSR, no atrial fib  episode of chest pain, in the ED 07/2016  She did have palpitations September 2017, went to the hospital at that time No arrhythmia noted Follow-up event monitor did not show arrhythmia CHADS VASC 5 if she is in fact having atrial fibrillation She is on aspirin  CT chest  2015: Atherosclerotic calcification aorta and minimally in coronary arteries.     PMH:   has a past medical history of Allergy, Asthma, Atrial fibrillation (HCC), Diabetes mellitus, GERD (gastroesophageal reflux disease), Hyperlipidemia, Osteopenia, Sleep disorder, and Thyroid disease.  PSH:    Past Surgical History:  Procedure Laterality Date  . CERVICAL CONE BIOPSY  1960's  .  CHOLECYSTECTOMY    . NASAL POLYP SURGERY  01/01  . STRABISMUS SURGERY  1946 / [id number]  . VAGINAL DELIVERY     x2    Current Outpatient Medications  Medication Sig Dispense Refill  . ACCU-CHEK SMARTVIEW test strip TEST BLOOD SUGAR BID UTD  3  . albuterol (PROVENTIL HFA;VENTOLIN HFA) 108 (90 Base) MCG/ACT inhaler Inhale 2 puffs into the lungs every 6 (six) hours as needed. 18 g 0  . ALPRAZolam (XANAX) 0.5 MG tablet TAKE 1 TABLET BY MOUTH TWICE DAILY AS NEEDED 100 tablet 0  . aspirin 81 MG tablet Take 81 mg by mouth daily.      Marland Kitchen. atorvastatin (LIPITOR) 20 MG tablet TAKE 1 TABLET BY MOUTH EVERY DAY 90 tablet 1  . B-D UF III MINI PEN NEEDLES 31G X 5 MM MISC USE TO INJECT INSULIN FOUR TIMES DAILY AS DIRECTED 200 each 6  . B-D ULTRA-FINE 33 LANCETS MISC Use to test blood sugar twice daily dx: E11.9 200 each 3  . calcitonin, salmon, (MIACALCIN/FORTICAL) 200 UNIT/ACT nasal spray USE 1 SPRAY IN THE NOSE EVERY DAY(ALTERNATING NOSTRILS) 3.7 mL 11  . CALCIUM-VITAMIN D PO Take 1 tablet by mouth daily.      . cetirizine (ZYRTEC) 1 MG/ML syrup Take 10 mg by mouth daily.    . Chromium 100 MCG TABS Take by mouth daily.      . Coenzyme Q10 200 MG capsule Take 200 mg by mouth daily.      . fish oil-omega-3 fatty acids 1000 MG  capsule Take 1 g by mouth daily.      . fluticasone (FLONASE) 50 MCG/ACT nasal spray Place 2 sprays into both nostrils daily.    . folic acid (FOLVITE) 1 MG tablet Take 1 mg by mouth daily.      . Insulin Glargine (LANTUS SOLOSTAR) 100 UNIT/ML Solostar Pen INJECT 22 UNITS UNDER THE SKIN EVERY MORNING.    Marland Kitchen. ketoconazole (NIZORAL) 2 % cream Apply 1 application topically daily. To affected areas under arm 15 g 1  . levothyroxine (SYNTHROID, LEVOTHROID) 100 MCG tablet Take 100 mcg by mouth daily before breakfast.    . meclizine (ANTIVERT) 25 MG tablet TAKE 1 TABLET BY MOUTH THREE TIMES DAILY AS NEEDED 90 tablet 1  . metFORMIN (GLUCOPHAGE) 1000 MG tablet TAKING 2 IN THE MORNING AND 2 IN EVENING     . metoprolol succinate (TOPROL-XL) 25 MG 24 hr tablet TAKE 1 TABLET(25 MG) BY MOUTH DAILY 90 tablet 3  . montelukast (SINGULAIR) 10 MG tablet TAKE 1 TABLET BY MOUTH EVERY NIGHT AT BEDTIME 90 tablet 0  . Multiple Vitamin (MULTIVITAMIN) tablet Take 1 tablet by mouth daily.    Marland Kitchen. omeprazole (PRILOSEC) 20 MG capsule TAKE 1 CAPSULE BY MOUTH TWICE DAILY BEFORE A MEAL 180 capsule 0  . vitamin B-12 (CYANOCOBALAMIN) 1000 MCG tablet Take 1,000 mcg by mouth daily.    . vitamin C (ASCORBIC ACID) 500 MG tablet Take 500 mg by mouth daily.     No current facility-administered medications for this visit.      Allergies:   Cefdinir; Ciprofloxacin; Clarithromycin; Miglitol; Niacin; and Qvar [beclomethasone]   Social History:  The patient  reports that  has never smoked. she has never used smokeless tobacco. She reports that she drinks alcohol. She reports that she does not use drugs.   Family History:   family history includes Asthma in her mother; Coronary artery disease in her father.    Review of Systems: Review of Systems  Constitutional: Negative.   Respiratory: Negative.   Cardiovascular: Negative.   Gastrointestinal: Negative.   Musculoskeletal: Negative.   Neurological: Negative.   Psychiatric/Behavioral: Negative.   All other systems reviewed and are negative.    PHYSICAL EXAM: VS:  BP 126/68 (BP Location: Left Arm, Patient Position: Sitting, Cuff Size: Normal)   Pulse 76   Ht 5\' 4"  (1.626 m)   Wt 168 lb 12 oz (76.5 kg)   BMI 28.97 kg/m  , BMI Body mass index is 28.97 kg/m. GEN: Well nourished, well developed, in no acute distress  HEENT: normal  Neck: no JVD, carotid bruits, or masses Cardiac: RRR; no murmurs, rubs, or gallops,no edema  Respiratory:  clear to auscultation bilaterally, normal work of breathing GI: soft, nontender, nondistended, + BS MS: no deformity or atrophy  Skin: warm and dry, no rash Neuro:  Strength and sensation are intact Psych: euthymic mood, full  affect    Recent Labs: No results found for requested labs within last 8760 hours.    Lipid Panel Lab Results  Component Value Date   CHOL 100 06/23/2016   HDL 44 06/23/2016   LDLCALC 18 06/23/2016   TRIG 190 (H) 06/23/2016      Wt Readings from Last 3 Encounters:  10/27/17 168 lb 12 oz (76.5 kg)  07/24/17 165 lb 12 oz (75.2 kg)  06/09/17 166 lb 8 oz (75.5 kg)       ASSESSMENT AND PLAN:  Paroxysmal atrial fibrillation (HCC) No documentation of atrial fibrillation as above Long discussion  concerning arrhythmia, risk and benefit of blood thinners Recommended she monitor her heart rate at home closely looking for arrhythmia Discussed various modalities that she can use to monitor heart rate  Essential hypertension Suggested she monitor blood pressure at home and call us if numbers continue to run high Stable  Mixed hyperlipidemia Cholesterol is at goal on the current lipid regimen. No changes to the medications were made. Stable.  Tolerating Lipitor Order placed for repeat lipid panel at her convenience  Aortic atherosclerosis (HCC) Seen on CT scan Cholesterol at goal, we have placed orders for repeat lipid panel   Total encounter time more than 25 minutes  Greater than 50% was spent in counseling and coordination of care with the patient   Disposition:   F/U  12 months   Orders Placed This Encounter  Procedures  . EKG 12-Lead     Signed, Dossie Arbour, M.D., Ph.D. 10/27/2017  [facility] [place], [place] [phone]

## 2017-10-27 ENCOUNTER — Encounter: Payer: Self-pay | Admitting: Cardiovascular Disease

## 2017-10-27 ENCOUNTER — Ambulatory Visit: Payer: Medicare PPO | Admitting: Cardiovascular Disease

## 2017-10-27 VITALS — BP 126/68 | HR 76 | Ht 64.0 in | Wt 168.8 lb

## 2017-10-27 DIAGNOSIS — I480 Paroxysmal atrial fibrillation: Secondary | ICD-10-CM | POA: Diagnosis not present

## 2017-10-27 DIAGNOSIS — I700 Atherosclerosis of aorta: Secondary | ICD-10-CM | POA: Diagnosis not present

## 2017-10-27 DIAGNOSIS — I10 Essential (primary) hypertension: Secondary | ICD-10-CM | POA: Diagnosis not present

## 2017-10-27 DIAGNOSIS — E1165 Type 2 diabetes mellitus with hyperglycemia: Secondary | ICD-10-CM | POA: Diagnosis not present

## 2017-10-27 DIAGNOSIS — IMO0001 Reserved for inherently not codable concepts without codable children: Secondary | ICD-10-CM

## 2017-10-27 DIAGNOSIS — E782 Mixed hyperlipidemia: Secondary | ICD-10-CM

## 2017-10-27 NOTE — Patient Instructions (Addendum)

## 2017-12-02 ENCOUNTER — Other Ambulatory Visit: Payer: Self-pay | Admitting: Internal Medicine

## 2017-12-05 DIAGNOSIS — H6123 Impacted cerumen, bilateral: Secondary | ICD-10-CM | POA: Diagnosis not present

## 2017-12-05 DIAGNOSIS — H903 Sensorineural hearing loss, bilateral: Secondary | ICD-10-CM | POA: Diagnosis not present

## 2017-12-11 DIAGNOSIS — E113393 Type 2 diabetes mellitus with moderate nonproliferative diabetic retinopathy without macular edema, bilateral: Secondary | ICD-10-CM | POA: Diagnosis not present

## 2017-12-12 LAB — HM DIABETES EYE EXAM

## 2017-12-13 ENCOUNTER — Encounter: Payer: Self-pay | Admitting: Internal Medicine

## 2017-12-13 NOTE — Progress Notes (Signed)
12/11/2017

## 2017-12-31 ENCOUNTER — Other Ambulatory Visit: Payer: Self-pay | Admitting: Internal Medicine

## 2018-01-01 NOTE — Telephone Encounter (Signed)
Last filled 03-21-17 #100 Last OV 07-24-17 Next OV 02-05-18

## 2018-01-25 ENCOUNTER — Ambulatory Visit: Payer: Self-pay

## 2018-01-25 NOTE — Telephone Encounter (Signed)
Pt has had issue with smears of blood on stool for the past 6 months. Pt states she thinks its hemorrhoids. Pt also c/o dull ache to the bladder area that goes away when she stands and lower back ache. Pt requesting appt with Dr Alphonsus SiasLetvak but has one 02/05/18 at 1130. Pt asked to call back if worsens. Reason for Disposition . [1] Rectal bleeding is minimal (e.g., blood just on toilet paper, few drops, streaks on surface of normal formed BM) AND [2] bleeding recurs 3 or more times on treatment  Answer Assessment - Initial Assessment Questions 1. APPEARANCE of BLOOD: "What color is it?" "Is it passed separately, on the surface of the stool, or mixed in with the stool?"      Lt red-on the surface 2. AMOUNT: "How much blood was passed?"      Smear on toilet  3. FREQUENCY: "How many times has blood been passed with the stools?"      Not every time 2-3 days ago 4. ONSET: "When was the blood first seen in the stools?" (Days or weeks)      6 months 5. DIARRHEA: "Is there also some diarrhea?" If so, ask: "How many diarrhea stools were passed in past 24 hours?"      No, no 6. CONSTIPATION: "Do you have constipation?" If so, "How bad is it?"     no 7. RECURRENT SYMPTOMS: "Have you had blood in your stools before?" If so, ask: "When was the last time?" and "What happened that time?"      Yes- few days ago- pt states she ignored it. 8. BLOOD THINNERS: "Do you take any blood thinners?" (e.g., Coumadin/warfarin, Pradaxa/dabigatran, aspirin)     81 mg aspirin 9. OTHER SYMPTOMS: "Do you have any other symptoms?"  (e.g., abdominal pain, vomiting, dizziness, fever)     Dull ache to bladder area that goes away and lower back pain dull ache 10. PREGNANCY: "Is there any chance you are pregnant?" "When was your last menstrual period?"       n/a  Protocols used: RECTAL BLEEDING-A-AH

## 2018-02-05 ENCOUNTER — Ambulatory Visit (INDEPENDENT_AMBULATORY_CARE_PROVIDER_SITE_OTHER): Payer: Medicare PPO | Admitting: Internal Medicine

## 2018-02-05 ENCOUNTER — Encounter: Payer: Self-pay | Admitting: Internal Medicine

## 2018-02-05 VITALS — BP 120/68 | HR 69 | Temp 98.0°F | Ht 62.5 in | Wt 167.0 lb

## 2018-02-05 DIAGNOSIS — I700 Atherosclerosis of aorta: Secondary | ICD-10-CM

## 2018-02-05 DIAGNOSIS — F39 Unspecified mood [affective] disorder: Secondary | ICD-10-CM | POA: Diagnosis not present

## 2018-02-05 DIAGNOSIS — Z794 Long term (current) use of insulin: Secondary | ICD-10-CM | POA: Diagnosis not present

## 2018-02-05 DIAGNOSIS — E113299 Type 2 diabetes mellitus with mild nonproliferative diabetic retinopathy without macular edema, unspecified eye: Secondary | ICD-10-CM

## 2018-02-05 DIAGNOSIS — I10 Essential (primary) hypertension: Secondary | ICD-10-CM | POA: Diagnosis not present

## 2018-02-05 DIAGNOSIS — Z0000 Encounter for general adult medical examination without abnormal findings: Secondary | ICD-10-CM | POA: Diagnosis not present

## 2018-02-05 DIAGNOSIS — Z23 Encounter for immunization: Secondary | ICD-10-CM | POA: Diagnosis not present

## 2018-02-05 DIAGNOSIS — E083293 Diabetes mellitus due to underlying condition with mild nonproliferative diabetic retinopathy without macular edema, bilateral: Secondary | ICD-10-CM | POA: Diagnosis not present

## 2018-02-05 DIAGNOSIS — J4520 Mild intermittent asthma, uncomplicated: Secondary | ICD-10-CM | POA: Diagnosis not present

## 2018-02-05 DIAGNOSIS — I480 Paroxysmal atrial fibrillation: Secondary | ICD-10-CM | POA: Diagnosis not present

## 2018-02-05 DIAGNOSIS — Z7189 Other specified counseling: Secondary | ICD-10-CM | POA: Diagnosis not present

## 2018-02-05 LAB — LIPID PANEL
CHOL/HDL RATIO: 2
Cholesterol: 91 mg/dL (ref 0–200)
HDL: 48.9 mg/dL (ref >39.00)
LDL CALC: 25 mg/dL (ref 0–99)
NonHDL: 41.75
Triglycerides: 83 mg/dL (ref 0.0–149.0)
VLDL: 16.6 mg/dL (ref 0.0–40.0)

## 2018-02-05 LAB — HM DIABETES FOOT EXAM

## 2018-02-05 NOTE — Addendum Note (Signed)
Addended by: Eual FinesBRIDGES, Dorina Ribaudo P on: 02/05/2018 12:54 PM   Modules accepted: Orders

## 2018-02-05 NOTE — Assessment & Plan Note (Signed)
Gets palpitations at times--but no clear recurrence

## 2018-02-05 NOTE — Assessment & Plan Note (Signed)
Still seems to have good control Will try the new endocrinologist Mild retinopathy--no action

## 2018-02-05 NOTE — Assessment & Plan Note (Signed)
I have personally reviewed the Medicare Annual Wellness questionnaire and have noted 1. The patient's medical and social history 2. Their use of alcohol, tobacco or illicit drugs 3. Their current medications and supplements 4. The patient's functional ability including ADL's, fall risks, home safety risks and hearing or visual             impairment. 5. Diet and physical activities 6. Evidence for depression or mood disorders  The patients weight, height, BMI and visual acuity have been recorded in the chart I have made referrals, counseling and provided education to the patient based review of the above and I have provided the pt with a written personalized care plan for preventive services.  I have provided you with a copy of your personalized plan for preventive services. Please take the time to review along with your updated medication list.  No cancer screening due to age Pneumovax booster Discussed shingrix--she will consider (when available) Discussed exercise

## 2018-02-05 NOTE — Assessment & Plan Note (Signed)
Well controlled 

## 2018-02-05 NOTE — Assessment & Plan Note (Signed)
Chronic mild dysthymia No medications needed Xanax for sleep at times

## 2018-02-05 NOTE — Progress Notes (Signed)
Subjective:    Patient ID: [name], female    DOB: March 08, 1931, 84 y.o.   MRN: [id number]  HPI Here for Medicare wellness visit and follow up of chronic health conditions With daughter [name] Reviewed form and advanced directives Reviewed other doctors Poor hearing--does have aides Drinks highball and occasional white wine No tobacco No falls No exercise No sig depression or anhedonia. Bored at times though (doesn't get out that much due to husband) Will get down at times due to this. Uses the xanax at night if she can't sleep Some stress with husband's dependency Independent with instrumental ADLs No sig memory problems  Saw Dr Mariah Milling for her palpitations Not clear if she is still getting atrial fibrillation Noted aortic atherosclerosis on imaging Needs lipid blood test--on statin  Still sees Dr Paul--but she is leaving Checks sugars bid---- AM 100-140 (rare under 100), PM usually high 100's Deciding if she wants to start with a new doctor there Recent eye exam---early retinopathy noted  Had a bad "weak" period 2 weeks ago Lower abdominal pain into back Felt her esophagus backing up Then it would affect her heart--palpitations Tries to limit coffee and red wine Prior to this--her abdominal symptoms had really calmed down  No recent asthma problems No regular cough or wheezing  Current Outpatient Medications on File Prior to Visit  Medication Sig Dispense Refill  . ACCU-CHEK SMARTVIEW test strip TEST BLOOD SUGAR BID UTD  3  . albuterol (PROVENTIL HFA;VENTOLIN HFA) 108 (90 Base) MCG/ACT inhaler Inhale 2 puffs into the lungs every 6 (six) hours as needed. 18 g 0  . ALPRAZolam (XANAX) 0.5 MG tablet TAKE 1 TABLET BY MOUTH TWICE DAILY AS NEEDED 100 tablet 0  . aspirin 81 MG tablet Take 81 mg by mouth daily.      Marland Kitchen atorvastatin (LIPITOR) 20 MG tablet TAKE 1 TABLET BY MOUTH EVERY DAY 90 tablet 1  . B-D UF III MINI PEN NEEDLES 31G X 5 MM MISC USE TO INJECT INSULIN  FOUR TIMES DAILY AS DIRECTED 200 each 6  . B-D ULTRA-FINE 33 LANCETS MISC Use to test blood sugar twice daily dx: E11.9 200 each 3  . calcitonin, salmon, (MIACALCIN/FORTICAL) 200 UNIT/ACT nasal spray USE 1 SPRAY IN THE NOSE EVERY DAY(ALTERNATING NOSTRILS) 3.7 mL 11  . CALCIUM-VITAMIN D PO Take 1 tablet by mouth daily.      . cetirizine (ZYRTEC) 1 MG/ML syrup Take 10 mg by mouth daily.    . Chromium 100 MCG TABS Take by mouth daily.      . Coenzyme Q10 200 MG capsule Take 200 mg by mouth daily.      . fish oil-omega-3 fatty acids 1000 MG capsule Take 1 g by mouth daily.      . fluticasone (FLONASE) 50 MCG/ACT nasal spray Place 2 sprays into both nostrils daily.    . folic acid (FOLVITE) 1 MG tablet Take 1 mg by mouth daily.      . Insulin Glargine (LANTUS SOLOSTAR) 100 UNIT/ML Solostar Pen INJECT 22 UNITS UNDER THE SKIN EVERY MORNING.    Marland Kitchen ketoconazole (NIZORAL) 2 % cream Apply 1 application topically daily. To affected areas under arm 15 g 1  . levothyroxine (SYNTHROID, LEVOTHROID) 100 MCG tablet Take 100 mcg by mouth daily before breakfast.    . meclizine (ANTIVERT) 25 MG tablet TAKE 1 TABLET BY MOUTH THREE TIMES DAILY AS NEEDED 90 tablet 1  . metFORMIN (GLUCOPHAGE) 1000 MG tablet TAKING 2 IN THE MORNING AND  2 IN EVENING    . metoprolol succinate (TOPROL-XL) 25 MG 24 hr tablet TAKE 1 TABLET(25 MG) BY MOUTH DAILY 90 tablet 3  . montelukast (SINGULAIR) 10 MG tablet TAKE 1 TABLET BY MOUTH EVERY NIGHT AT BEDTIME 90 tablet 0  . Multiple Vitamin (MULTIVITAMIN) tablet Take 1 tablet by mouth daily.    Marland Kitchen. omeprazole (PRILOSEC) 20 MG capsule TAKE 1 CAPSULE BY MOUTH TWICE DAILY BEFORE A MEAL 180 capsule 0  . vitamin B-12 (CYANOCOBALAMIN) 1000 MCG tablet Take 1,000 mcg by mouth daily.    . vitamin C (ASCORBIC ACID) 500 MG tablet Take 500 mg by mouth daily.     No current facility-administered medications on file prior to visit.     Allergies  Allergen Reactions  . Cefdinir     REACTION: unspecified    . Ciprofloxacin     rash  . Clarithromycin     REACTION: weird dreams (on prednisone at the same time but has tolerated this in the past)  . Miglitol     REACTION: unspecified  . Niacin     REACTION: unspecified  . Qvar [Beclomethasone] Other (See Comments)    Feels funny Feels funny    Past Medical History:  Diagnosis Date  . Allergy   . Asthma   . Atrial fibrillation (HCC)   . Diabetes mellitus   . GERD (gastroesophageal reflux disease)   . Hyperlipidemia   . Osteopenia   . Sleep disorder   . Thyroid disease     Past Surgical History:  Procedure Laterality Date  . CERVICAL CONE BIOPSY  1960's  . CHOLECYSTECTOMY    . NASAL POLYP SURGERY  01/01  . STRABISMUS SURGERY  1946 / [id number]  . VAGINAL DELIVERY     x2    Family History  Problem Relation Age of Onset  . Asthma Mother   . Coronary artery disease Father     Social History   Socioeconomic History  . Marital status: Married    Spouse name: Not on file  . Number of children: 2  . Years of education: Not on file  . Highest education level: Not on file  Occupational History    Employer: RETIRED  Social Needs  . Financial resource strain: Not on file  . Food insecurity:    Worry: Not on file    Inability: Not on file  . Transportation needs:    Medical: Not on file    Non-medical: Not on file  Tobacco Use  . Smoking status: Never Smoker  . Smokeless tobacco: Never Used  Substance and Sexual Activity  . Alcohol use: Yes    Alcohol/week: 0.0 oz    Comment: 1 glass of wine per day  . Drug use: No  . Sexual activity: Not on file  Lifestyle  . Physical activity:    Days per week: Not on file    Minutes per session: Not on file  . Stress: Not on file  Relationships  . Social connections:    Talks on phone: Not on file    Gets together: Not on file    Attends religious service: Not on file    Active member of club or organization: Not on file    Attends meetings of clubs or organizations: Not on  file    Relationship status: Not on file  . Intimate partner violence:    Fear of current or ex partner: Not on file    Emotionally abused: Not on file  Physically abused: Not on file    Forced sexual activity: Not on file  Other Topics Concern  . Not on file  Social History Narrative   Has living will   Not sure about formal POA --but requests husband, then daughter [name] (and son [name]) to be health care POAs   Would accept resuscitation attempts   Not sure about feeding tubes   Review of Systems Stays constipated. Known fissures/hemorrhoids ---will see some blood (slight) on TP at times Some loose stools from metformin---then takes 1/2 imodium (which will bind her up) Your appetite is good Weight is stable Teeth okay---keeps up with dentist Keeps up with dermatologist No dysphagia Some pain in knees--nothing serious (mostly on stairs)    Objective:   Physical Exam  Constitutional: She is oriented to person, place, and time. She appears well-developed. No distress.  HENT:  Mouth/Throat: Oropharynx is clear and moist. No oropharyngeal exudate.  Neck: No thyromegaly present.  Cardiovascular: Normal rate, regular rhythm, normal heart sounds and intact distal pulses. Exam reveals no gallop.  No murmur heard. Pulmonary/Chest: Effort normal and breath sounds normal. No respiratory distress. She has no wheezes. She has no rales.  Abdominal: Soft. There is no tenderness.  Musculoskeletal: She exhibits no edema or tenderness.  Lymphadenopathy:    She has no cervical adenopathy.  Neurological: She is alert and oriented to person, place, and time.  President--- "Garnet Koyanagi, Obama, Bush" [phone] D-[id number] Recall 2/3  Normal sensation in feet  Skin: No rash noted.  No foot lesions  Psychiatric: She has a normal mood and affect. Her behavior is normal.          Assessment & Plan:

## 2018-02-05 NOTE — Assessment & Plan Note (Signed)
BP Readings from Last 3 Encounters:  02/05/18 120/68  10/27/17 126/68  07/24/17 128/72   Good control

## 2018-02-05 NOTE — Progress Notes (Signed)
Hearing Screening Comments: Has hearing aids. Not wearing them today Vision Screening Comments: January 2019

## 2018-02-05 NOTE — Assessment & Plan Note (Signed)
On imaging Is on statin 

## 2018-02-05 NOTE — Assessment & Plan Note (Signed)
See social history 

## 2018-02-13 DIAGNOSIS — L239 Allergic contact dermatitis, unspecified cause: Secondary | ICD-10-CM | POA: Diagnosis not present

## 2018-02-19 ENCOUNTER — Other Ambulatory Visit: Payer: Self-pay | Admitting: Internal Medicine

## 2018-02-21 DIAGNOSIS — R269 Unspecified abnormalities of gait and mobility: Secondary | ICD-10-CM | POA: Diagnosis not present

## 2018-02-21 DIAGNOSIS — M542 Cervicalgia: Secondary | ICD-10-CM | POA: Diagnosis not present

## 2018-02-21 DIAGNOSIS — R42 Dizziness and giddiness: Secondary | ICD-10-CM | POA: Diagnosis not present

## 2018-02-26 ENCOUNTER — Other Ambulatory Visit: Payer: Self-pay | Admitting: Acute Care

## 2018-02-26 DIAGNOSIS — M5412 Radiculopathy, cervical region: Secondary | ICD-10-CM

## 2018-02-26 DIAGNOSIS — R269 Unspecified abnormalities of gait and mobility: Secondary | ICD-10-CM

## 2018-02-27 ENCOUNTER — Other Ambulatory Visit: Payer: Self-pay | Admitting: Internal Medicine

## 2018-03-06 ENCOUNTER — Ambulatory Visit
Admission: RE | Admit: 2018-03-06 | Discharge: 2018-03-06 | Disposition: A | Discharge status: Home / Self Care | Payer: Medicare PPO | Source: Ambulatory Visit | Attending: Acute Care | Admitting: Acute Care

## 2018-03-06 DIAGNOSIS — I6782 Cerebral ischemia: Secondary | ICD-10-CM | POA: Diagnosis not present

## 2018-03-06 DIAGNOSIS — R269 Unspecified abnormalities of gait and mobility: Secondary | ICD-10-CM | POA: Diagnosis not present

## 2018-03-06 DIAGNOSIS — R42 Dizziness and giddiness: Secondary | ICD-10-CM | POA: Insufficient documentation

## 2018-03-06 DIAGNOSIS — M5412 Radiculopathy, cervical region: Secondary | ICD-10-CM | POA: Diagnosis not present

## 2018-03-06 DIAGNOSIS — M542 Cervicalgia: Secondary | ICD-10-CM | POA: Insufficient documentation

## 2018-03-06 DIAGNOSIS — M4682 Other specified inflammatory spondylopathies, cervical region: Secondary | ICD-10-CM | POA: Insufficient documentation

## 2018-03-06 DIAGNOSIS — M4802 Spinal stenosis, cervical region: Secondary | ICD-10-CM | POA: Insufficient documentation

## 2018-03-09 DIAGNOSIS — E039 Hypothyroidism, unspecified: Secondary | ICD-10-CM | POA: Diagnosis not present

## 2018-03-09 DIAGNOSIS — E538 Deficiency of other specified B group vitamins: Secondary | ICD-10-CM | POA: Diagnosis not present

## 2018-03-09 DIAGNOSIS — E11649 Type 2 diabetes mellitus with hypoglycemia without coma: Secondary | ICD-10-CM | POA: Diagnosis not present

## 2018-03-09 DIAGNOSIS — Z794 Long term (current) use of insulin: Secondary | ICD-10-CM | POA: Diagnosis not present

## 2018-03-16 DIAGNOSIS — E039 Hypothyroidism, unspecified: Secondary | ICD-10-CM | POA: Diagnosis not present

## 2018-03-16 DIAGNOSIS — E538 Deficiency of other specified B group vitamins: Secondary | ICD-10-CM | POA: Diagnosis not present

## 2018-03-16 DIAGNOSIS — Z794 Long term (current) use of insulin: Secondary | ICD-10-CM | POA: Diagnosis not present

## 2018-03-16 DIAGNOSIS — E11649 Type 2 diabetes mellitus with hypoglycemia without coma: Secondary | ICD-10-CM | POA: Diagnosis not present

## 2018-03-19 DIAGNOSIS — R42 Dizziness and giddiness: Secondary | ICD-10-CM | POA: Diagnosis not present

## 2018-03-19 DIAGNOSIS — M542 Cervicalgia: Secondary | ICD-10-CM | POA: Diagnosis not present

## 2018-03-19 DIAGNOSIS — R269 Unspecified abnormalities of gait and mobility: Secondary | ICD-10-CM | POA: Diagnosis not present

## 2018-03-26 ENCOUNTER — Other Ambulatory Visit: Payer: Self-pay | Admitting: Acute Care

## 2018-03-26 DIAGNOSIS — R42 Dizziness and giddiness: Secondary | ICD-10-CM

## 2018-03-30 ENCOUNTER — Ambulatory Visit
Admission: RE | Admit: 2018-03-30 | Discharge: 2018-03-30 | Disposition: A | Discharge status: Home / Self Care | Payer: Medicare PPO | Source: Ambulatory Visit | Attending: Acute Care | Admitting: Acute Care

## 2018-03-30 DIAGNOSIS — R42 Dizziness and giddiness: Secondary | ICD-10-CM | POA: Insufficient documentation

## 2018-03-30 DIAGNOSIS — I6621 Occlusion and stenosis of right posterior cerebral artery: Secondary | ICD-10-CM | POA: Insufficient documentation

## 2018-03-30 MED ORDER — IOPAMIDOL (ISOVUE-370) INJECTION 76%
75.0000 mL | Freq: Once | INTRAVENOUS | Status: AC | PRN
  Administered 2018-03-30: 75 mL via INTRAVENOUS

## 2018-04-02 ENCOUNTER — Ambulatory Visit: Payer: Self-pay

## 2018-04-02 NOTE — Telephone Encounter (Signed)
Patient called in with c/o "abdominal pain." She says "I am not hurting now, but when I do it goes into my back and rubbing my back makes it better. I just want to talk to Dr. Alphonsus Sias about why I am still having the pain. I mentioned it to him the last time I was in the office and nothing came of it. But since the pain has been coming and going, I want some tests run to see what the pain is coming from. It is in the lower right side of my abdomen at a 7 when it hurts, not doubling me over, but a constant pain." I asked about other symptoms, she denies. According to protocol, see PCP within 2 weeks. Patient says "I have to see him before 5/30, because my daughter is driving me and she goes out of town for 2 weeks on 04/13/18." I asked her to hold to call the office to see if she can be worked in. Office called and spoke to [place], [facility]. I asked could the patient be placed in a Same Day slot and the patient will have multiple topics to discuss with Dr. Alphonsus Sias and if the Same Day can be changed to an Office Visit. She checked with Carollee Herter, who advised she will call the patient back with an appointment once she asks Dr. Alphonsus Sias. I advised the patient Carollee Herter will call her with the appointment once she speaks to Dr. Alphonsus Sias, patient verbalized understanding. There is a Same Day slot on 04/04/18 at 1215 with a 1230 block. The patient mentioned talking to Dr. Alphonsus Sias about her abdominal pain and changing her Xanax to Valium.   Reason for Disposition . Abdominal pain is a chronic symptom (recurrent or ongoing AND present > 4 weeks)  Answer Assessment - Initial Assessment Questions 1. LOCATION: "Where does it hurt?"      Lower abdomen on the right lower side 2. RADIATION: "Does the pain shoot anywhere else?" (e.g., chest, back)     Lower back 3. ONSET: "When did the pain begin?" (e.g., minutes, hours or days ago)      Several months 4. SUDDEN: "Gradual or sudden onset?"     Gradual 5. PATTERN "Does the pain come and go,  or is it constant?"    - If constant: "Is it getting better, staying the same, or worsening?"      (Note: Constant means the pain never goes away completely; most serious pain is constant and it progresses)     - If intermittent: "How long does it last?" "Do you have pain now?"     (Note: Intermittent means the pain goes away completely between bouts)     Off and on pain 6. SEVERITY: "How bad is the pain?"  (e.g., Scale 1-10; mild, moderate, or severe)   - MILD (1-3): doesn't interfere with normal activities, abdomen soft and not tender to touch    - MODERATE (4-7): interferes with normal activities or awakens from sleep, tender to touch    - SEVERE (8-10): excruciating pain, doubled over, unable to do any normal activities      7 when it hurts lying down mostly. 7. RECURRENT SYMPTOM: "Have you ever had this type of abdominal pain before?" If so, ask: "When was the last time?" and "What happened that time?"      No 8. CAUSE: "What do you think is causing the abdominal pain?"     I don't know 9. RELIEVING/AGGRAVATING FACTORS: "What makes it better or worse?" (  e.g., movement, antacids, bowel movement)     When I rub back, that makes the pain better 10. OTHER SYMPTOMS: "Has there been any vomiting, diarrhea, constipation, or urine problems?"       No 11. PREGNANCY: "Is there any chance you are pregnant?" "When was your last menstrual period?"       No  Protocols used: ABDOMINAL PAIN - [facility]

## 2018-04-02 NOTE — Telephone Encounter (Signed)
Spoke to pt. Made appt at 1215pm

## 2018-04-02 NOTE — Telephone Encounter (Signed)
Okay to add her on tomorrow at noon

## 2018-04-03 ENCOUNTER — Ambulatory Visit: Payer: Medicare PPO | Admitting: Internal Medicine

## 2018-04-03 ENCOUNTER — Encounter: Payer: Self-pay | Admitting: Internal Medicine

## 2018-04-03 VITALS — BP 124/80 | HR 75 | Temp 97.9°F | Ht 62.5 in | Wt 166.0 lb

## 2018-04-03 DIAGNOSIS — R42 Dizziness and giddiness: Secondary | ICD-10-CM | POA: Diagnosis not present

## 2018-04-03 DIAGNOSIS — R1031 Right lower quadrant pain: Secondary | ICD-10-CM | POA: Insufficient documentation

## 2018-04-03 MED ORDER — DIAZEPAM 2 MG PO TABS: 2.0000 mg | ORAL_TABLET | Freq: Two times a day (BID) | ORAL | 0 refills | Status: DC | PRN

## 2018-04-03 NOTE — Progress Notes (Signed)
Subjective:    Patient ID: [name], female    DOB: 1931/04/01, 84 y.o.   MRN: [id number]  HPI Here with daughter due to abdominal pain Husband is especially worried  Pain is RLQ and radiates to back Not triggered by eating or moving bowels Seems to be more when down in bed Not clear if related to bowel Goes back 2 months or so  Has had a tough time in past month Trouble after extended time looking up Trouble walking Saw Dr Otho Perl CT and CT angio No stroke but does have spinal stenosis and DJD  Breaks out into sweat easily Single episode of chill---then yesterday felt better Bowels okay---occasional diarrhea from metformin  Feels the xanax not helping that much Uses rarely Found valium helped more (premedication)  Current Outpatient Medications on File Prior to Visit  Medication Sig Dispense Refill  . ACCU-CHEK SMARTVIEW test strip TEST BLOOD SUGAR BID UTD  3  . albuterol (PROVENTIL HFA;VENTOLIN HFA) 108 (90 Base) MCG/ACT inhaler Inhale 2 puffs into the lungs every 6 (six) hours as needed. 18 g 0  . ALPRAZolam (XANAX) 0.5 MG tablet TAKE 1 TABLET BY MOUTH TWICE DAILY AS NEEDED 100 tablet 0  . aspirin 81 MG tablet Take 81 mg by mouth daily.      Marland Kitchen atorvastatin (LIPITOR) 20 MG tablet TAKE 1 TABLET BY MOUTH EVERY DAY 90 tablet 1  . atorvastatin (LIPITOR) 20 MG tablet TAKE 1 TABLET BY MOUTH EVERY DAY 90 tablet 0  . B-D UF III MINI PEN NEEDLES 31G X 5 MM MISC USE TO INJECT INSULIN FOUR TIMES DAILY AS DIRECTED 200 each 6  . B-D ULTRA-FINE 33 LANCETS MISC Use to test blood sugar twice daily dx: E11.9 200 each 3  . calcitonin, salmon, (MIACALCIN/FORTICAL) 200 UNIT/ACT nasal spray USE 1 SPRAY IN THE NOSE EVERY DAY(ALTERNATING NOSTRILS) 3.7 mL 11  . CALCIUM-VITAMIN D PO Take 1 tablet by mouth daily.      . cetirizine (ZYRTEC) 1 MG/ML syrup Take 10 mg by mouth daily.    . Chromium 100 MCG TABS Take by mouth daily.      . Coenzyme Q10 200 MG capsule Take 200 mg by mouth  daily.      . fish oil-omega-3 fatty acids 1000 MG capsule Take 1 g by mouth daily.      . fluticasone (FLONASE) 50 MCG/ACT nasal spray Place 2 sprays into both nostrils daily.    . folic acid (FOLVITE) 1 MG tablet Take 1 mg by mouth daily.      . Insulin Glargine (LANTUS SOLOSTAR) 100 UNIT/ML Solostar Pen INJECT 22 UNITS UNDER THE SKIN EVERY MORNING.    Marland Kitchen levothyroxine (SYNTHROID, LEVOTHROID) 100 MCG tablet Take 100 mcg by mouth daily before breakfast.    . meclizine (ANTIVERT) 25 MG tablet TAKE 1 TABLET BY MOUTH THREE TIMES DAILY AS NEEDED 90 tablet 1  . metFORMIN (GLUCOPHAGE) 1000 MG tablet TAKING 2 IN THE MORNING AND 2 IN EVENING    . metoprolol succinate (TOPROL-XL) 25 MG 24 hr tablet TAKE 1 TABLET(25 MG) BY MOUTH DAILY 90 tablet 3  . montelukast (SINGULAIR) 10 MG tablet TAKE 1 TABLET BY MOUTH EVERY NIGHT AT BEDTIME 90 tablet 0  . montelukast (SINGULAIR) 10 MG tablet TAKE 1 TABLET BY MOUTH EVERY NIGHT AT BEDTIME 90 tablet 3  . Multiple Vitamin (MULTIVITAMIN) tablet Take 1 tablet by mouth daily.    Marland Kitchen omeprazole (PRILOSEC) 20 MG capsule TAKE 1 CAPSULE BY MOUTH TWICE  DAILY BEFORE A MEAL 180 capsule 0  . vitamin B-12 (CYANOCOBALAMIN) 1000 MCG tablet Take 1,000 mcg by mouth daily.    . vitamin C (ASCORBIC ACID) 500 MG tablet Take 500 mg by mouth daily.     No current facility-administered medications on file prior to visit.     Allergies  Allergen Reactions  . Cefdinir     REACTION: unspecified  . Ciprofloxacin     rash  . Clarithromycin     REACTION: weird dreams (on prednisone at the same time but has tolerated this in the past)  . Miglitol     REACTION: unspecified  . Niacin     REACTION: unspecified  . Qvar [Beclomethasone] Other (See Comments)    Feels funny Feels funny    Past Medical History:  Diagnosis Date  . Allergy   . Asthma   . Atrial fibrillation (HCC)   . Diabetes mellitus   . GERD (gastroesophageal reflux disease)   . Hyperlipidemia   . Osteopenia   .  Sleep disorder   . Thyroid disease     Past Surgical History:  Procedure Laterality Date  . CERVICAL CONE BIOPSY  1960's  . CHOLECYSTECTOMY    . NASAL POLYP SURGERY  01/01  . STRABISMUS SURGERY  1946 / 58  . VAGINAL DELIVERY     x2    Family History  Problem Relation Age of Onset  . Asthma Mother   . Coronary artery disease Father     Social History   Socioeconomic History  . Marital status: Married    Spouse name: Not on file  . Number of children: 2  . Years of education: Not on file  . Highest education level: Not on file  Occupational History    Employer: RETIRED  Social Needs  . Financial resource strain: Not on file  . Food insecurity:    Worry: Not on file    Inability: Not on file  . Transportation needs:    Medical: Not on file    Non-medical: Not on file  Tobacco Use  . Smoking status: Never Smoker  . Smokeless tobacco: Never Used  Substance and Sexual Activity  . Alcohol use: Yes    Alcohol/week: 0.0 oz    Comment: 1 glass of wine per day  . Drug use: No  . Sexual activity: Not on file  Lifestyle  . Physical activity:    Days per week: Not on file    Minutes per session: Not on file  . Stress: Not on file  Relationships  . Social connections:    Talks on phone: Not on file    Gets together: Not on file    Attends religious service: Not on file    Active member of club or organization: Not on file    Attends meetings of clubs or organizations: Not on file    Relationship status: Not on file  . Intimate partner violence:    Fear of current or ex partner: Not on file    Emotionally abused: Not on file    Physically abused: Not on file    Forced sexual activity: Not on file  Other Topics Concern  . Not on file  Social History Narrative   Has living will   Not sure about formal POA --but requests husband, then daughter [name] (and son [name]) to be health care POAs   Would accept resuscitation attempts   Not sure about feeding tubes    Review of Systems  Appetite is okay Weight is stable No N/V    Objective:   Physical Exam  Constitutional: She appears well-developed. No distress.  Cardiovascular: Normal rate, regular rhythm and normal heart sounds. Exam reveals no gallop.  No murmur heard. Pulmonary/Chest: Effort normal and breath sounds normal. No respiratory distress. She has no wheezes. She has no rales.  Abdominal: Soft. Normal appearance. She exhibits no distension. There is no tenderness.  Skin: No rash noted.          Assessment & Plan:

## 2018-04-03 NOTE — Assessment & Plan Note (Addendum)
Persisting for 2 months or more Hard to characterize Could be abdominal, ovarian, muscular Will check ultrasound

## 2018-04-04 ENCOUNTER — Other Ambulatory Visit: Payer: Self-pay | Admitting: Internal Medicine

## 2018-04-05 ENCOUNTER — Telehealth: Payer: Self-pay

## 2018-04-05 ENCOUNTER — Other Ambulatory Visit: Payer: Self-pay | Admitting: Internal Medicine

## 2018-04-05 DIAGNOSIS — R1031 Right lower quadrant pain: Secondary | ICD-10-CM

## 2018-04-05 NOTE — Telephone Encounter (Signed)
This is appropriate I have signed the orders

## 2018-04-05 NOTE — Telephone Encounter (Signed)
Judeth Cornfield with [place] [facility] called about orders for abd [place] for 04/06/18. Judeth Cornfield said if Dr Alphonsus Sias is concerned about abd organs such as GB, liver and kidneys the abd [place] is OK but if concerned about uterus, ovaries or pelvis will need to add [place] of pelvis. Dr Alphonsus Sias said concerned about entire abd including ovaries. Judeth Cornfield is going to add [place] of pelvis and transvaginal and ask that Dr Alphonsus Sias sign electronically before 04/06/18.

## 2018-04-06 ENCOUNTER — Other Ambulatory Visit: Payer: Self-pay | Admitting: Internal Medicine

## 2018-04-06 ENCOUNTER — Ambulatory Visit
Admission: RE | Admit: 2018-04-06 | Discharge: 2018-04-06 | Disposition: A | Discharge status: Home / Self Care | Payer: Medicare PPO | Source: Ambulatory Visit | Attending: Internal Medicine | Admitting: Internal Medicine

## 2018-04-06 DIAGNOSIS — R1031 Right lower quadrant pain: Secondary | ICD-10-CM | POA: Insufficient documentation

## 2018-04-06 DIAGNOSIS — R1084 Generalized abdominal pain: Secondary | ICD-10-CM | POA: Diagnosis not present

## 2018-04-06 DIAGNOSIS — Z9049 Acquired absence of other specified parts of digestive tract: Secondary | ICD-10-CM | POA: Insufficient documentation

## 2018-04-10 DIAGNOSIS — M542 Cervicalgia: Secondary | ICD-10-CM | POA: Diagnosis not present

## 2018-04-10 DIAGNOSIS — R42 Dizziness and giddiness: Secondary | ICD-10-CM | POA: Diagnosis not present

## 2018-04-10 DIAGNOSIS — R269 Unspecified abnormalities of gait and mobility: Secondary | ICD-10-CM | POA: Diagnosis not present

## 2018-04-20 DIAGNOSIS — H8111 Benign paroxysmal vertigo, right ear: Secondary | ICD-10-CM | POA: Diagnosis not present

## 2018-05-14 DIAGNOSIS — H8111 Benign paroxysmal vertigo, right ear: Secondary | ICD-10-CM | POA: Diagnosis not present

## 2018-05-15 ENCOUNTER — Other Ambulatory Visit: Payer: Self-pay | Admitting: Internal Medicine

## 2018-05-28 ENCOUNTER — Other Ambulatory Visit: Payer: Self-pay | Admitting: Internal Medicine

## 2018-06-04 DIAGNOSIS — H6123 Impacted cerumen, bilateral: Secondary | ICD-10-CM | POA: Diagnosis not present

## 2018-06-04 DIAGNOSIS — H903 Sensorineural hearing loss, bilateral: Secondary | ICD-10-CM | POA: Diagnosis not present

## 2018-06-13 DIAGNOSIS — M5030 Other cervical disc degeneration, unspecified cervical region: Secondary | ICD-10-CM | POA: Diagnosis not present

## 2018-06-13 DIAGNOSIS — M47812 Spondylosis without myelopathy or radiculopathy, cervical region: Secondary | ICD-10-CM | POA: Diagnosis not present

## 2018-06-13 DIAGNOSIS — M4802 Spinal stenosis, cervical region: Secondary | ICD-10-CM | POA: Diagnosis not present

## 2018-06-13 DIAGNOSIS — M5412 Radiculopathy, cervical region: Secondary | ICD-10-CM | POA: Diagnosis not present

## 2018-06-29 ENCOUNTER — Other Ambulatory Visit: Payer: Self-pay | Admitting: Internal Medicine

## 2018-06-29 NOTE — Telephone Encounter (Signed)
Approved: okay x 1 year 

## 2018-07-18 DIAGNOSIS — M542 Cervicalgia: Secondary | ICD-10-CM | POA: Diagnosis not present

## 2018-07-18 DIAGNOSIS — R42 Dizziness and giddiness: Secondary | ICD-10-CM | POA: Diagnosis not present

## 2018-07-18 DIAGNOSIS — R269 Unspecified abnormalities of gait and mobility: Secondary | ICD-10-CM | POA: Diagnosis not present

## 2018-07-27 ENCOUNTER — Encounter: Payer: Self-pay | Admitting: Internal Medicine

## 2018-07-27 ENCOUNTER — Ambulatory Visit: Payer: Medicare PPO | Admitting: Internal Medicine

## 2018-07-27 VITALS — BP 122/76 | HR 78 | Temp 98.5°F | Resp 12 | Ht 62.5 in | Wt 167.0 lb

## 2018-07-27 DIAGNOSIS — J45901 Unspecified asthma with (acute) exacerbation: Secondary | ICD-10-CM | POA: Insufficient documentation

## 2018-07-27 DIAGNOSIS — F39 Unspecified mood [affective] disorder: Secondary | ICD-10-CM

## 2018-07-27 DIAGNOSIS — J4521 Mild intermittent asthma with (acute) exacerbation: Secondary | ICD-10-CM | POA: Diagnosis not present

## 2018-07-27 MED ORDER — PREDNISONE 20 MG PO TABS: 40.0000 mg | ORAL_TABLET | Freq: Every day | ORAL | 0 refills | Status: DC

## 2018-07-27 MED ORDER — ALBUTEROL SULFATE HFA 108 (90 BASE) MCG/ACT IN AERS: 2.0000 | INHALATION_SPRAY | Freq: Four times a day (QID) | RESPIRATORY_TRACT | 1 refills | Status: DC | PRN

## 2018-07-27 MED ORDER — AMOXICILLIN-POT CLAVULANATE 875-125 MG PO TABS
1.0000 | ORAL_TABLET | Freq: Two times a day (BID) | ORAL | 0 refills | Status: DC
Start: 2018-07-27 — End: 2018-10-30

## 2018-07-27 MED ORDER — ALPRAZOLAM 0.25 MG PO TABS: 0.2500 mg | ORAL_TABLET | Freq: Two times a day (BID) | ORAL | 0 refills | Status: DC | PRN

## 2018-07-27 NOTE — Patient Instructions (Signed)
Please start the augmentin only if your sinus symptoms worsen over the next few days. You should take the cetirizine regularly now for the allergy season. (along with the singulair and flonase)

## 2018-07-27 NOTE — Assessment & Plan Note (Signed)
Will change back to the alprazolam

## 2018-07-27 NOTE — Assessment & Plan Note (Signed)
Likely related to allergies Taking singulair and flonase (again) Needs to get back on the cetirizine Will give a few days of prednisone augmentin if increased sinus symptoms

## 2018-07-27 NOTE — Progress Notes (Signed)
Subjective:    Patient ID: [name], female    DOB: 07-Feb-1931, 84 y.o.   MRN: [id number]  HPI Here due to respiratory symptoms With daughter  Was coughing all night--kept her up Wheezing and bringing stuff up "creeping up on me" for a few nights Tends to get this every fall and spring Seems bronchial  Did try the ventolin inhaler---was expired though It did help Also tried singulair she had and flonase (has been regular with singulair)  Has had some "tickling" for a while before the cough Thought she may have fever this AM No clear sinus pain Mild PND perhaps  Didn't like the valium----felt it made her more claustrophic Wants to go back to alprazolam  Current Outpatient Medications on File Prior to Visit  Medication Sig Dispense Refill  . ACCU-CHEK SMARTVIEW test strip TEST BLOOD SUGAR BID UTD  3  . albuterol (PROVENTIL HFA;VENTOLIN HFA) 108 (90 Base) MCG/ACT inhaler Inhale 2 puffs into the lungs every 6 (six) hours as needed. 18 g 0  . aspirin 81 MG tablet Take 81 mg by mouth daily.      Marland Kitchen. atorvastatin (LIPITOR) 20 MG tablet TAKE 1 TABLET BY MOUTH EVERY DAY 90 tablet 3  . B-D UF III MINI PEN NEEDLES 31G X 5 MM MISC USE TO INJECT INSULIN FOUR TIMES DAILY AS DIRECTED 200 each 6  . B-D ULTRA-FINE 33 LANCETS MISC Use to test blood sugar twice daily dx: E11.9 200 each 3  . calcitonin, salmon, (MIACALCIN/FORTICAL) 200 UNIT/ACT nasal spray USE 1 SPRAY IN THE NOSE EVERY DAY(ALTERNATING NOSTRILS) 3.7 mL 11  . CALCIUM-VITAMIN D PO Take 1 tablet by mouth daily.      . cetirizine (ZYRTEC) 1 MG/ML syrup Take 10 mg by mouth daily.    . Chromium 100 MCG TABS Take by mouth daily.      . Coenzyme Q10 200 MG capsule Take 200 mg by mouth daily.      . fish oil-omega-3 fatty acids 1000 MG capsule Take 1 g by mouth daily.      . fluticasone (FLONASE) 50 MCG/ACT nasal spray Place 2 sprays into both nostrils daily.    . folic acid (FOLVITE) 1 MG tablet Take 1 mg by mouth daily.      .  Insulin Glargine (LANTUS SOLOSTAR) 100 UNIT/ML Solostar Pen INJECT 22 UNITS UNDER THE SKIN EVERY MORNING.    Marland Kitchen. levothyroxine (SYNTHROID, LEVOTHROID) 100 MCG tablet Take 100 mcg by mouth daily before breakfast.    . meclizine (ANTIVERT) 25 MG tablet TAKE 1 TABLET BY MOUTH THREE TIMES DAILY AS NEEDED 90 tablet 1  . metFORMIN (GLUCOPHAGE) 1000 MG tablet TAKING 2 IN THE MORNING AND 2 IN EVENING    . metoprolol succinate (TOPROL-XL) 25 MG 24 hr tablet TAKE 1 TABLET(25 MG) BY MOUTH DAILY 90 tablet 3  . montelukast (SINGULAIR) 10 MG tablet TAKE 1 TABLET BY MOUTH EVERY NIGHT AT BEDTIME 90 tablet 0  . Multiple Vitamin (MULTIVITAMIN) tablet Take 1 tablet by mouth daily.    Marland Kitchen. omeprazole (PRILOSEC) 20 MG capsule TAKE 1 CAPSULE BY MOUTH TWICE DAILY BEFORE A MEAL 180 capsule 3  . vitamin B-12 (CYANOCOBALAMIN) 1000 MCG tablet Take 1,000 mcg by mouth daily.    . vitamin C (ASCORBIC ACID) 500 MG tablet Take 500 mg by mouth daily.     No current facility-administered medications on file prior to visit.     Allergies  Allergen Reactions  . Cefdinir  REACTION: unspecified  . Ciprofloxacin     rash  . Clarithromycin     REACTION: weird dreams (on prednisone at the same time but has tolerated this in the past)  . Miglitol     REACTION: unspecified  . Niacin     REACTION: unspecified  . Qvar [Beclomethasone] Other (See Comments)    Feels funny Feels funny    Past Medical History:  Diagnosis Date  . Allergy   . Asthma   . Atrial fibrillation (HCC)   . Diabetes mellitus   . GERD (gastroesophageal reflux disease)   . Hyperlipidemia   . Osteopenia   . Sleep disorder   . Thyroid disease     Past Surgical History:  Procedure Laterality Date  . CERVICAL CONE BIOPSY  1960's  . CHOLECYSTECTOMY    . NASAL POLYP SURGERY  01/01  . STRABISMUS SURGERY  1946 / 40  . VAGINAL DELIVERY     x2    Family History  Problem Relation Age of Onset  . Asthma Mother   . Coronary artery disease Father      Social History   Socioeconomic History  . Marital status: Married    Spouse name: Not on file  . Number of children: 2  . Years of education: Not on file  . Highest education level: Not on file  Occupational History    Employer: RETIRED  Social Needs  . Financial resource strain: Not on file  . Food insecurity:    Worry: Not on file    Inability: Not on file  . Transportation needs:    Medical: Not on file    Non-medical: Not on file  Tobacco Use  . Smoking status: Never Smoker  . Smokeless tobacco: Never Used  Substance and Sexual Activity  . Alcohol use: Yes    Alcohol/week: 0.0 standard drinks    Comment: 1 glass of wine per day  . Drug use: No  . Sexual activity: Not on file  Lifestyle  . Physical activity:    Days per week: Not on file    Minutes per session: Not on file  . Stress: Not on file  Relationships  . Social connections:    Talks on phone: Not on file    Gets together: Not on file    Attends religious service: Not on file    Active member of club or organization: Not on file    Attends meetings of clubs or organizations: Not on file    Relationship status: Not on file  . Intimate partner violence:    Fear of current or ex partner: Not on file    Emotionally abused: Not on file    Physically abused: Not on file    Forced sexual activity: Not on file  Other Topics Concern  . Not on file  Social History Narrative   Has living will   Not sure about formal POA --but requests husband, then daughter [name] (and son [name]) to be health care POAs   Would accept resuscitation attempts   Not sure about feeding tubes   Review of Systems  No ear pain Some sore throat No vomiting or diarrhea Appetite is okay     Objective:   Physical Exam  Constitutional: She appears well-developed. No distress.  HENT:  Mouth/Throat: Oropharynx is clear and moist. No oropharyngeal exudate.  Mild maxillary tenderness Moderate nasal inflammation TMs normal   Neck: No thyromegaly present.  Respiratory: Effort normal and breath sounds normal. No  respiratory distress. She has no wheezes. She has no rales.  Lymphadenopathy:    She has no cervical adenopathy.           Assessment & Plan:

## 2018-08-08 DIAGNOSIS — I10 Essential (primary) hypertension: Secondary | ICD-10-CM | POA: Diagnosis not present

## 2018-08-08 DIAGNOSIS — E785 Hyperlipidemia, unspecified: Secondary | ICD-10-CM | POA: Diagnosis not present

## 2018-08-08 DIAGNOSIS — H547 Unspecified visual loss: Secondary | ICD-10-CM | POA: Diagnosis not present

## 2018-08-08 DIAGNOSIS — K219 Gastro-esophageal reflux disease without esophagitis: Secondary | ICD-10-CM | POA: Diagnosis not present

## 2018-08-08 DIAGNOSIS — G4700 Insomnia, unspecified: Secondary | ICD-10-CM | POA: Diagnosis not present

## 2018-08-08 DIAGNOSIS — E119 Type 2 diabetes mellitus without complications: Secondary | ICD-10-CM | POA: Diagnosis not present

## 2018-08-08 DIAGNOSIS — E669 Obesity, unspecified: Secondary | ICD-10-CM | POA: Diagnosis not present

## 2018-08-08 DIAGNOSIS — M810 Age-related osteoporosis without current pathological fracture: Secondary | ICD-10-CM | POA: Diagnosis not present

## 2018-08-08 DIAGNOSIS — E039 Hypothyroidism, unspecified: Secondary | ICD-10-CM | POA: Diagnosis not present

## 2018-08-20 ENCOUNTER — Ambulatory Visit (INDEPENDENT_AMBULATORY_CARE_PROVIDER_SITE_OTHER): Payer: Medicare PPO

## 2018-08-20 DIAGNOSIS — Z23 Encounter for immunization: Secondary | ICD-10-CM

## 2018-09-03 ENCOUNTER — Other Ambulatory Visit: Payer: Self-pay | Admitting: Internal Medicine

## 2018-09-03 DIAGNOSIS — E11649 Type 2 diabetes mellitus with hypoglycemia without coma: Secondary | ICD-10-CM | POA: Diagnosis not present

## 2018-09-03 DIAGNOSIS — E039 Hypothyroidism, unspecified: Secondary | ICD-10-CM | POA: Diagnosis not present

## 2018-09-03 DIAGNOSIS — Z794 Long term (current) use of insulin: Secondary | ICD-10-CM | POA: Diagnosis not present

## 2018-09-24 DIAGNOSIS — E785 Hyperlipidemia, unspecified: Secondary | ICD-10-CM | POA: Diagnosis not present

## 2018-09-24 DIAGNOSIS — E1165 Type 2 diabetes mellitus with hyperglycemia: Secondary | ICD-10-CM | POA: Diagnosis not present

## 2018-09-24 DIAGNOSIS — E1169 Type 2 diabetes mellitus with other specified complication: Secondary | ICD-10-CM | POA: Diagnosis not present

## 2018-09-24 DIAGNOSIS — E039 Hypothyroidism, unspecified: Secondary | ICD-10-CM | POA: Diagnosis not present

## 2018-09-24 DIAGNOSIS — I10 Essential (primary) hypertension: Secondary | ICD-10-CM | POA: Diagnosis not present

## 2018-09-24 DIAGNOSIS — E1159 Type 2 diabetes mellitus with other circulatory complications: Secondary | ICD-10-CM | POA: Diagnosis not present

## 2018-09-24 DIAGNOSIS — Z794 Long term (current) use of insulin: Secondary | ICD-10-CM | POA: Diagnosis not present

## 2018-10-03 ENCOUNTER — Telehealth: Payer: Self-pay | Admitting: *Deleted

## 2018-10-03 NOTE — Telephone Encounter (Signed)
Probably fine to try the advil for a couple of days and heat on the neck

## 2018-10-03 NOTE — Telephone Encounter (Signed)
Patient's daughter called wanting some advice. [name] stated that patient was bending over and loss her balance and fell. [name] stated that when patient fell she rolled and hit her head on the wall. [name] stated that the patient states that she is find. [name] stated that there is no signs of bruising, bleeding or concussion, but states that her neck hurts some.[name]. Melinda stated that she told her mom that she may need to go to the ER to be checked out, but she refuses. [name] stated that patient has taken Advil and she wants know what Dr. Alphonsus SiasLetvak recommends.

## 2018-10-03 NOTE — Telephone Encounter (Signed)
Spoke to pt's daughter. 

## 2018-10-18 DIAGNOSIS — E113393 Type 2 diabetes mellitus with moderate nonproliferative diabetic retinopathy without macular edema, bilateral: Secondary | ICD-10-CM | POA: Diagnosis not present

## 2018-10-29 NOTE — Progress Notes (Signed)
Cardiology Office Note  Date:  10/30/2018   ID:  [name], DOB 11-08-1931, MRN [id number]  PCP:  [name], MD   Chief Complaint  Patient presents with  . other    12 mo follow up. Medications reviewed verbally."Doing Well"     HPI:  [name] is a 84 y.o. female with a hx of paroxysmal atrial fibrillation. diagnosed 23 years ago. In hospital  ("heart was feeling really weird") chadsvasc 4. HTN Hyperlipidemia DM II, followed by Dr. Renae Fickle, HBA1C 8.2  on metoprolol and digoxin. Who presents for routine follow-up of her palpitations/atrial fibrillation  Reports having several medical issues over the course of the past year  Had some dizzy epsiodes CT and MRi head Severe stenosis of right posterior artery Diffuse atherosclerosis.  Also felt to have vertigo, sent to ear nose throat  Lab work reviewed with her HBA1C up to 8.2 Poor diet  Denies any tachycardia or palpitations concerning for arrhythmia No regular exercise program Denies shortness of breath or chest pain  EKG personally reviewed by myself on todays visit Shows normal sinus rhythm with rate 82 bpm no significant ST or T wave changes  Other past medical history reviewed Echo 06/2016: Normal EF >55%, normal LA size Event monitor 06/2016: NSR, no atrial fib  episode of chest pain, in the ED 07/2016  She did have palpitations September 2017, went to the hospital at that time No arrhythmia noted Follow-up event monitor did not show arrhythmia CHADS VASC 5 if she is in fact having atrial fibrillation She is on aspirin  CT chest  2015: Atherosclerotic calcification aorta and minimally in coronary arteries.   PMH:   has a past medical history of Allergy, Asthma, Atrial fibrillation (HCC), Diabetes mellitus, GERD (gastroesophageal reflux disease), Hyperlipidemia, Osteopenia, Sleep disorder, and Thyroid disease.  PSH:    Past Surgical History:  Procedure Laterality Date  . CERVICAL CONE  BIOPSY  1960's  . CHOLECYSTECTOMY    . NASAL POLYP SURGERY  01/01  . STRABISMUS SURGERY  1946 / 74  . VAGINAL DELIVERY     x2    Current Outpatient Medications  Medication Sig Dispense Refill  . ACCU-CHEK SMARTVIEW test strip TEST BLOOD SUGAR BID UTD  3  . ALPRAZolam (XANAX) 0.25 MG tablet Take 1 tablet (0.25 mg total) by mouth 2 (two) times daily as needed for anxiety. 60 tablet 0  . aspirin 81 MG tablet Take 81 mg by mouth daily.      Marland Kitchen atorvastatin (LIPITOR) 20 MG tablet TAKE 1 TABLET BY MOUTH EVERY DAY 90 tablet 3  . B-D UF III MINI PEN NEEDLES 31G X 5 MM MISC USE TO INJECT INSULIN FOUR TIMES DAILY AS DIRECTED 200 each 1  . B-D ULTRA-FINE 33 LANCETS MISC Use to test blood sugar twice daily dx: E11.9 200 each 3  . calcitonin, salmon, (MIACALCIN/FORTICAL) 200 UNIT/ACT nasal spray USE 1 SPRAY IN THE NOSE EVERY DAY(ALTERNATING NOSTRILS) 3.7 mL 11  . CALCIUM-VITAMIN D PO Take 1 tablet by mouth daily.      . cetirizine (ZYRTEC) 1 MG/ML syrup Take 10 mg by mouth daily.    . Chromium 100 MCG TABS Take by mouth daily.      . Coenzyme Q10 200 MG capsule Take 200 mg by mouth daily.      . fish oil-omega-3 fatty acids 1000 MG capsule Take 1 g by mouth daily.      . fluticasone (FLONASE) 50 MCG/ACT nasal spray Place  2 sprays into both nostrils daily.    . folic acid (FOLVITE) 1 MG tablet Take 1 mg by mouth daily.      . Insulin Glargine (LANTUS SOLOSTAR) 100 UNIT/ML Solostar Pen INJECT 22 UNITS UNDER THE SKIN EVERY MORNING.    Marland Kitchen. levothyroxine (SYNTHROID, LEVOTHROID) 100 MCG tablet Take 100 mcg by mouth daily before breakfast.    . metFORMIN (GLUCOPHAGE) 1000 MG tablet TAKING 2 IN THE MORNING AND 2 IN EVENING    . metoprolol succinate (TOPROL-XL) 25 MG 24 hr tablet TAKE 1 TABLET(25 MG) BY MOUTH DAILY 90 tablet 3  . montelukast (SINGULAIR) 10 MG tablet TAKE 1 TABLET BY MOUTH EVERY NIGHT AT BEDTIME 90 tablet 0  . Multiple Vitamin (MULTIVITAMIN) tablet Take 1 tablet by mouth daily.    Marland Kitchen. omeprazole  (PRILOSEC) 20 MG capsule TAKE 1 CAPSULE BY MOUTH TWICE DAILY BEFORE A MEAL 180 capsule 3  . vitamin B-12 (CYANOCOBALAMIN) 1000 MCG tablet Take 1,000 mcg by mouth daily.    . vitamin C (ASCORBIC ACID) 500 MG tablet Take 500 mg by mouth daily.    Marland Kitchen. albuterol (PROVENTIL HFA;VENTOLIN HFA) 108 (90 Base) MCG/ACT inhaler Inhale 2 puffs into the lungs every 6 (six) hours as needed. (Patient not taking: Reported on 10/30/2018) 18 g 1  . meclizine (ANTIVERT) 25 MG tablet TAKE 1 TABLET BY MOUTH THREE TIMES DAILY AS NEEDED (Patient not taking: Reported on 10/30/2018) 90 tablet 1   No current facility-administered medications for this visit.      Allergies:   Cefdinir; Ciprofloxacin; Clarithromycin; Miglitol; Niacin; and Qvar [beclomethasone]   Social History:  The patient  reports that she has never smoked. She has never used smokeless tobacco. She reports current alcohol use. She reports that she does not use drugs.   Family History:   family history includes Asthma in her mother; Coronary artery disease in her father.    Review of Systems: Review of Systems  Constitutional: Negative.   Respiratory: Negative.   Cardiovascular: Negative.   Gastrointestinal: Negative.   Musculoskeletal: Negative.   Neurological: Positive for dizziness.  Psychiatric/Behavioral: Negative.   All other systems reviewed and are negative.    PHYSICAL EXAM: VS:  BP (!) 142/72 (BP Location: Left Arm, Patient Position: Sitting, Cuff Size: Normal)   Pulse 82   Ht 5\' 3"  (1.6 m)   Wt 168 lb (76.2 kg)   BMI 29.76 kg/m  , BMI Body mass index is 29.76 kg/m. Constitutional:  oriented to person, place, and time. No distress.  HENT:  Head: Grossly normal Eyes:  no discharge. No scleral icterus.  Neck: No JVD, no carotid bruits  Cardiovascular: Regular rate and rhythm, no murmurs appreciated Pulmonary/Chest: Clear to auscultation bilaterally, no wheezes or rails Abdominal: Soft.  no distension.  no tenderness.   Musculoskeletal: Normal range of motion Neurological:  normal muscle tone. Coordination normal. No atrophy Skin: Skin warm and dry Psychiatric: normal affect, pleasant   Recent Labs: No results found for requested labs within last 8760 hours.    Lipid Panel Lab Results  Component Value Date   CHOL 91 02/05/2018   HDL 48.90 02/05/2018   LDLCALC 25 02/05/2018   TRIG 83.0 02/05/2018      Wt Readings from Last 3 Encounters:  10/30/18 168 lb (76.2 kg)  07/27/18 167 lb (75.8 kg)  04/03/18 166 lb (75.3 kg)       ASSESSMENT AND PLAN:  Paroxysmal atrial fibrillation (HCC) No documentation of atrial fibrillation Denies any new symptoms  concerning for arrhythmia  Essential hypertension Blood pressure stable, no medication changes made  Mixed hyperlipidemia Cholesterol is at goal on the current lipid regimen. No changes to the medications were made.  Cerebrovascular disease Stressed the importance of aggressive diabetes control Disease seen on CT scan, MRI  Aortic atherosclerosis (HCC) Seen on CT scan Cholesterol at goal,  Stressed importance of aggressive diabetes control   Total encounter time more than 25 minutes  Greater than 50% was spent in counseling and coordination of care with the patient   Disposition:   F/U  12 months   Orders Placed This Encounter  Procedures  . EKG 12-Lead     Signed, Dossie Arbour, M.D., Ph.D. 10/30/2018  [facility] [place], [place] [phone]

## 2018-10-30 ENCOUNTER — Encounter: Payer: Self-pay | Admitting: Cardiovascular Disease

## 2018-10-30 ENCOUNTER — Ambulatory Visit (INDEPENDENT_AMBULATORY_CARE_PROVIDER_SITE_OTHER): Payer: Medicare PPO | Admitting: Cardiovascular Disease

## 2018-10-30 VITALS — BP 142/72 | HR 82 | Ht 63.0 in | Wt 168.0 lb

## 2018-10-30 DIAGNOSIS — E782 Mixed hyperlipidemia: Secondary | ICD-10-CM | POA: Diagnosis not present

## 2018-10-30 DIAGNOSIS — E1165 Type 2 diabetes mellitus with hyperglycemia: Secondary | ICD-10-CM | POA: Diagnosis not present

## 2018-10-30 DIAGNOSIS — I10 Essential (primary) hypertension: Secondary | ICD-10-CM | POA: Diagnosis not present

## 2018-10-30 DIAGNOSIS — I700 Atherosclerosis of aorta: Secondary | ICD-10-CM | POA: Diagnosis not present

## 2018-10-30 DIAGNOSIS — I480 Paroxysmal atrial fibrillation: Secondary | ICD-10-CM

## 2018-10-30 DIAGNOSIS — IMO0001 Reserved for inherently not codable concepts without codable children: Secondary | ICD-10-CM

## 2018-10-30 NOTE — Patient Instructions (Signed)

## 2018-11-08 ENCOUNTER — Other Ambulatory Visit: Payer: Self-pay | Admitting: Internal Medicine

## 2018-11-09 NOTE — Telephone Encounter (Signed)
Last filled 07-27-18 #60 Last OV 07-27-18 Next OV 02-08-19 [facility] [address]

## 2018-11-22 DIAGNOSIS — I10 Essential (primary) hypertension: Secondary | ICD-10-CM | POA: Diagnosis not present

## 2018-11-22 DIAGNOSIS — E1169 Type 2 diabetes mellitus with other specified complication: Secondary | ICD-10-CM | POA: Diagnosis not present

## 2018-11-22 DIAGNOSIS — E1165 Type 2 diabetes mellitus with hyperglycemia: Secondary | ICD-10-CM | POA: Diagnosis not present

## 2018-11-22 DIAGNOSIS — E1159 Type 2 diabetes mellitus with other circulatory complications: Secondary | ICD-10-CM | POA: Diagnosis not present

## 2018-11-22 DIAGNOSIS — Z794 Long term (current) use of insulin: Secondary | ICD-10-CM | POA: Diagnosis not present

## 2018-11-22 DIAGNOSIS — E039 Hypothyroidism, unspecified: Secondary | ICD-10-CM | POA: Diagnosis not present

## 2018-11-22 DIAGNOSIS — E785 Hyperlipidemia, unspecified: Secondary | ICD-10-CM | POA: Diagnosis not present

## 2018-12-10 ENCOUNTER — Encounter: Payer: Self-pay | Admitting: Internal Medicine

## 2018-12-10 ENCOUNTER — Ambulatory Visit: Payer: Medicare PPO | Admitting: Internal Medicine

## 2018-12-10 VITALS — BP 118/70 | HR 72 | Temp 97.6°F | Ht 63.0 in | Wt 172.0 lb

## 2018-12-10 DIAGNOSIS — K625 Hemorrhage of anus and rectum: Secondary | ICD-10-CM

## 2018-12-10 DIAGNOSIS — B019 Varicella without complication: Secondary | ICD-10-CM

## 2018-12-10 DIAGNOSIS — B029 Zoster without complications: Secondary | ICD-10-CM | POA: Diagnosis not present

## 2018-12-10 MED ORDER — VALACYCLOVIR HCL 1 G PO TABS: 1000.0000 mg | ORAL_TABLET | Freq: Three times a day (TID) | ORAL | 1 refills | Status: DC

## 2018-12-10 MED ORDER — HYDROCORTISONE 2.5 % EX CREA: TOPICAL_CREAM | Freq: Three times a day (TID) | CUTANEOUS | 3 refills | Status: DC | PRN

## 2018-12-10 NOTE — Assessment & Plan Note (Signed)
Sounds minimal Likely irritations from metformin caused diarrhea or hemorrhoid Will try HC 2.5% cream and needs further eval if worsens

## 2018-12-10 NOTE — Assessment & Plan Note (Signed)
Long time itching around the seb keratoses on back---suddenly burning and painful Presentation suggestive of shingles Will treat

## 2018-12-10 NOTE — Progress Notes (Signed)
Subjective:    Patient ID: [name], female    DOB: [birth year], 85 y.o.   MRN: [id number]  HPI Here with daughter-- rash on back and some rectal bleeding  Skin issue started some time ago--- noticed on left shoulder blade Months ago Couldn't reach it but it itched Rubbed against wall, etc Then started spreading---itching and even pain Sensitive even to touch of her clothes Mentioned it to daughter this weekend---some moles there  Some red dots related to a couple of these lesions  Has known hemorrhoids Occasional spot of blood This morning-- soaked into tissue some No blood in bowl Some soreness  Current Outpatient Medications on File Prior to Visit  Medication Sig Dispense Refill  . ACCU-CHEK SMARTVIEW test strip TEST BLOOD SUGAR BID UTD  3  . albuterol (PROVENTIL HFA;VENTOLIN HFA) 108 (90 Base) MCG/ACT inhaler Inhale 2 puffs into the lungs every 6 (six) hours as needed. 18 g 1  . ALPRAZolam (XANAX) 0.25 MG tablet TAKE 1 TABLET(0.25 MG) BY MOUTH TWICE DAILY AS NEEDED FOR ANXIETY 60 tablet 0  . aspirin 81 MG tablet Take 81 mg by mouth daily.      Marland Kitchen atorvastatin (LIPITOR) 20 MG tablet TAKE 1 TABLET BY MOUTH EVERY DAY 90 tablet 3  . B-D UF III MINI PEN NEEDLES 31G X 5 MM MISC USE TO INJECT INSULIN FOUR TIMES DAILY AS DIRECTED 200 each 1  . B-D ULTRA-FINE 33 LANCETS MISC Use to test blood sugar twice daily dx: E11.9 200 each 3  . calcitonin, salmon, (MIACALCIN/FORTICAL) 200 UNIT/ACT nasal spray USE 1 SPRAY IN THE NOSE EVERY DAY(ALTERNATING NOSTRILS) 3.7 mL 11  . CALCIUM-VITAMIN D PO Take 1 tablet by mouth daily.      . cetirizine (ZYRTEC) 1 MG/ML syrup Take 10 mg by mouth daily.    . Chromium 100 MCG TABS Take by mouth daily.      . Coenzyme Q10 200 MG capsule Take 200 mg by mouth daily.      . fish oil-omega-3 fatty acids 1000 MG capsule Take 1 g by mouth daily.      . fluticasone (FLONASE) 50 MCG/ACT nasal spray Place 2 sprays into both nostrils daily.    . folic acid  (FOLVITE) 1 MG tablet Take 1 mg by mouth daily.      . Insulin Glargine (LANTUS SOLOSTAR) 100 UNIT/ML Solostar Pen INJECT 22 UNITS UNDER THE SKIN EVERY MORNING.    Marland Kitchen levothyroxine (SYNTHROID, LEVOTHROID) 100 MCG tablet Take 100 mcg by mouth daily before breakfast.    . meclizine (ANTIVERT) 25 MG tablet TAKE 1 TABLET BY MOUTH THREE TIMES DAILY AS NEEDED 90 tablet 1  . metFORMIN (GLUCOPHAGE) 1000 MG tablet TAKING 2 IN THE MORNING AND 2 IN EVENING    . metoprolol succinate (TOPROL-XL) 25 MG 24 hr tablet TAKE 1 TABLET(25 MG) BY MOUTH DAILY 90 tablet 3  . montelukast (SINGULAIR) 10 MG tablet TAKE 1 TABLET BY MOUTH EVERY NIGHT AT BEDTIME 90 tablet 0  . Multiple Vitamin (MULTIVITAMIN) tablet Take 1 tablet by mouth daily.    Marland Kitchen omeprazole (PRILOSEC) 20 MG capsule TAKE 1 CAPSULE BY MOUTH TWICE DAILY BEFORE A MEAL 180 capsule 3  . vitamin B-12 (CYANOCOBALAMIN) 1000 MCG tablet Take 1,000 mcg by mouth daily.    . vitamin C (ASCORBIC ACID) 500 MG tablet Take 500 mg by mouth daily.     No current facility-administered medications on file prior to visit.     Allergies  Allergen Reactions  .  Cefdinir     REACTION: unspecified  . Ciprofloxacin     rash  . Clarithromycin     REACTION: weird dreams (on prednisone at the same time but has tolerated this in the past)  . Miglitol     REACTION: unspecified  . Niacin     REACTION: unspecified  . Qvar [Beclomethasone] Other (See Comments)    Feels funny Feels funny    Past Medical History:  Diagnosis Date  . Allergy   . Asthma   . Atrial fibrillation (HCC)   . Diabetes mellitus   . GERD (gastroesophageal reflux disease)   . Hyperlipidemia   . Osteopenia   . Sleep disorder   . Thyroid disease     Past Surgical History:  Procedure Laterality Date  . CERVICAL CONE BIOPSY  1960's  . CHOLECYSTECTOMY    . NASAL POLYP SURGERY  01/01  . STRABISMUS SURGERY  1946 / 51  . VAGINAL DELIVERY     x2    Family History  Problem Relation Age of Onset    . Asthma Mother   . Coronary artery disease Father     Social History   Socioeconomic History  . Marital status: Married    Spouse name: Not on file  . Number of children: 2  . Years of education: Not on file  . Highest education level: Not on file  Occupational History    Employer: RETIRED  Social Needs  . Financial resource strain: Not on file  . Food insecurity:    Worry: Not on file    Inability: Not on file  . Transportation needs:    Medical: Not on file    Non-medical: Not on file  Tobacco Use  . Smoking status: Never Smoker  . Smokeless tobacco: Never Used  Substance and Sexual Activity  . Alcohol use: Yes    Alcohol/week: 0.0 standard drinks    Comment: 1 glass of wine per day  . Drug use: No  . Sexual activity: Not on file  Lifestyle  . Physical activity:    Days per week: Not on file    Minutes per session: Not on file  . Stress: Not on file  Relationships  . Social connections:    Talks on phone: Not on file    Gets together: Not on file    Attends religious service: Not on file    Active member of club or organization: Not on file    Attends meetings of clubs or organizations: Not on file    Relationship status: Not on file  . Intimate partner violence:    Fear of current or ex partner: Not on file    Emotionally abused: Not on file    Physically abused: Not on file    Forced sexual activity: Not on file  Other Topics Concern  . Not on file  Social History Narrative   Has living will   Not sure about formal POA --but requests husband, then daughter [name] (and son [name]) to be health care POAs   Would accept resuscitation attempts   Not sure about feeding tubes   Review of Systems Appetite is fine No abdominal pain Metformin dose cut to reduce diarrhea    Objective:   Physical Exam  Constitutional: She appears well-developed. No distress.  Genitourinary:    Genitourinary Comments: Deferred at her request   Skin:  Benign seborrheic  keratoses on back Several red papules---clustered around one of these ~T4-5. Other lesions more lateral  along the same dermatome           Assessment & Plan:

## 2018-12-18 ENCOUNTER — Telehealth: Payer: Self-pay | Admitting: *Deleted

## 2018-12-18 MED ORDER — GABAPENTIN 100 MG PO CAPS: 100.0000 mg | ORAL_CAPSULE | Freq: Three times a day (TID) | ORAL | 0 refills | Status: DC

## 2018-12-18 NOTE — Telephone Encounter (Signed)
Spoke to [name]. Sent in gabapentin.

## 2018-12-18 NOTE — Telephone Encounter (Signed)
Her Shingles broke out more after her appointment. She is using an OTC anti-itch hydrocortisone cream 1%. The pain is getting worse and she is unable to sleep. The pain and lesions spread to the anterior left causing breast pain.

## 2018-12-18 NOTE — Telephone Encounter (Signed)
She probably needs to refill the valacyclovir and take another week If she wants to try nerve pain relievers, can send gabapentin 100mg  tid (#90 x 0) Watch for sedation with the daytime doses if she wants to try it

## 2018-12-18 NOTE — Telephone Encounter (Signed)
pts daughter [name] is requesting a call back from [facility] directly. No additional info provided

## 2018-12-28 DIAGNOSIS — B0229 Other postherpetic nervous system involvement: Secondary | ICD-10-CM | POA: Diagnosis not present

## 2019-01-10 DIAGNOSIS — Z794 Long term (current) use of insulin: Secondary | ICD-10-CM | POA: Diagnosis not present

## 2019-01-10 DIAGNOSIS — E1165 Type 2 diabetes mellitus with hyperglycemia: Secondary | ICD-10-CM | POA: Diagnosis not present

## 2019-01-15 ENCOUNTER — Encounter: Payer: Self-pay | Admitting: Emergency Medicine

## 2019-01-15 ENCOUNTER — Other Ambulatory Visit: Payer: Self-pay

## 2019-01-15 ENCOUNTER — Emergency Department
Admission: EM | Admit: 2019-01-15 | Discharge: 2019-01-15 | Disposition: A | Discharge status: Home / Self Care | Payer: Medicare PPO | Attending: Emergency Medicine | Admitting: Emergency Medicine

## 2019-01-15 ENCOUNTER — Emergency Department: Payer: Medicare PPO

## 2019-01-15 DIAGNOSIS — Z7982 Long term (current) use of aspirin: Secondary | ICD-10-CM | POA: Insufficient documentation

## 2019-01-15 DIAGNOSIS — E039 Hypothyroidism, unspecified: Secondary | ICD-10-CM | POA: Diagnosis not present

## 2019-01-15 DIAGNOSIS — I2510 Atherosclerotic heart disease of native coronary artery without angina pectoris: Secondary | ICD-10-CM | POA: Insufficient documentation

## 2019-01-15 DIAGNOSIS — Z79899 Other long term (current) drug therapy: Secondary | ICD-10-CM | POA: Insufficient documentation

## 2019-01-15 DIAGNOSIS — Z794 Long term (current) use of insulin: Secondary | ICD-10-CM | POA: Insufficient documentation

## 2019-01-15 DIAGNOSIS — E119 Type 2 diabetes mellitus without complications: Secondary | ICD-10-CM | POA: Insufficient documentation

## 2019-01-15 DIAGNOSIS — Z9049 Acquired absence of other specified parts of digestive tract: Secondary | ICD-10-CM | POA: Insufficient documentation

## 2019-01-15 DIAGNOSIS — R9431 Abnormal electrocardiogram [ECG] [EKG]: Secondary | ICD-10-CM | POA: Diagnosis not present

## 2019-01-15 DIAGNOSIS — J45909 Unspecified asthma, uncomplicated: Secondary | ICD-10-CM | POA: Diagnosis not present

## 2019-01-15 DIAGNOSIS — R0602 Shortness of breath: Secondary | ICD-10-CM | POA: Diagnosis not present

## 2019-01-15 DIAGNOSIS — R079 Chest pain, unspecified: Secondary | ICD-10-CM | POA: Diagnosis not present

## 2019-01-15 LAB — TROPONIN I

## 2019-01-15 LAB — CBC
HEMATOCRIT: 38.7 % (ref 36.0–46.0)
HEMOGLOBIN: 13 g/dL (ref 12.0–15.0)
MCH: 29.1 pg (ref 26.0–34.0)
MCHC: 33.6 g/dL (ref 30.0–36.0)
MCV: 86.6 fL (ref 80.0–100.0)
Platelets: 243 10*3/uL (ref 150–400)
RBC: 4.47 MIL/uL (ref 3.87–5.11)
RDW: 13.7 % (ref 11.5–15.5)
WBC: 7.4 10*3/uL (ref 4.0–10.5)
nRBC: 0 % (ref 0.0–0.2)

## 2019-01-15 LAB — BASIC METABOLIC PANEL
Anion gap: 10 (ref 5–15)
BUN: 16 mg/dL (ref 8–23)
CO2: 20 mmol/L — ABNORMAL LOW (ref 22–32)
CREATININE: 0.71 mg/dL (ref 0.44–1.00)
Calcium: 8.7 mg/dL — ABNORMAL LOW (ref 8.9–10.3)
Chloride: 106 mmol/L (ref 98–111)
GFR calc Af Amer: >60 mL/min (ref >60)
GLUCOSE: 182 mg/dL — AB (ref 70–99)
POTASSIUM: 4.4 mmol/L (ref 3.5–5.1)
Sodium: 136 mmol/L (ref 135–145)

## 2019-01-15 NOTE — ED Notes (Signed)
ED Provider at bedside. 

## 2019-01-15 NOTE — ED Notes (Signed)
Pt reports sob.  No chest pain  Recent shingles episode 5 weeks ago on mid back area.  No n/v/  No diaphoresis.  Pt alert.  Family with pt   Pt in hallway bed.

## 2019-01-15 NOTE — ED Triage Notes (Signed)
Says she has chest pain that comes through from mid back for couple days and she also feel short of breath.

## 2019-01-15 NOTE — Discharge Instructions (Addendum)
Please seek medical attention for any high fevers, chest pain, shortness of breath, change in behavior, persistent vomiting, bloody stool or any other new or concerning symptoms.  

## 2019-01-15 NOTE — ED Triage Notes (Signed)
Pt presents to ED c/o sob x 3 days. Pt denies chest pain to this RN however reported chest pain to first nurse.

## 2019-01-15 NOTE — ED Provider Notes (Signed)
[facility] Emergency Department Provider Note   ____________________________________________   I have reviewed the triage vital signs and the nursing notes.   HISTORY  Chief Complaint Shortness of Breath   History limited by: Not Limited   HPI [name] is a 85 y.o. female who presents to the emergency department today because of a couple episodes of shortness of breath.  The patient first had these episodes 2 days ago.  She would notice with this described as a slight pausing and gasping for air.  This happened occasionally.  She did notice that it happened more when she was lying flat.  Apparently yesterday she felt fine but this morning she had similar episodes.  At the time my exam the patient states she feels fine.  She denies any fevers.  Denies any nausea or vomiting.  Per medical record review patient has a history of atrial fibrillation, DM.   Past Medical History:  Diagnosis Date  . Allergy   . Asthma   . Atrial fibrillation (HCC)   . Diabetes mellitus   . GERD (gastroesophageal reflux disease)   . Hyperlipidemia   . Osteopenia   . Sleep disorder   . Thyroid disease     Patient Active Problem List   Diagnosis Date Noted  . Varicella zoster 12/10/2018  . Rectal bleed 12/10/2018  . Exacerbation of asthma 07/27/2018  . RLQ abdominal pain 04/03/2018  . Tinea corporis 06/03/2017  . Aortic atherosclerosis (HCC) 04/22/2017  . Advance directive discussed with patient 02/02/2017  . Esophageal dysphagia 10/21/2016  . Palpitations 07/25/2016  . Cough 07/13/2015  . Mild intermittent asthma with exacerbation 09/08/2014  . Asthma, chronic 10/15/2013  . Routine general medical examination at a health care facility 03/19/2012  . 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 Episodic mood  disorder (HCC) 07/26/2010  . BACK PAIN, LUMBAR, WITH RADICULOPATHY 06/30/2009  . SLEEP DISORDER 11/12/2008  . Actinic keratosis 06/18/2008  . Essential hypertension 02/06/2008  . Hypothyroidism 01/12/2007  . Mixed hyperlipidemia 01/12/2007  . Paroxysmal atrial fibrillation (HCC) 01/12/2007  . Allergic rhinitis 01/12/2007  . GERD 01/12/2007  . OSTEOPENIA 01/12/2007    Past Surgical History:  Procedure Laterality Date  . CERVICAL CONE BIOPSY  1960's  . CHOLECYSTECTOMY    . NASAL POLYP SURGERY  01/01  . STRABISMUS SURGERY  1946 / 89  . VAGINAL DELIVERY     x2    Prior to Admission medications   Medication Sig Start Date End Date Taking? Authorizing Provider  ACCU-CHEK SMARTVIEW test strip TEST BLOOD SUGAR BID UTD 03/18/15   [provider]  albuterol (PROVENTIL HFA;VENTOLIN HFA) 108 (90 Base) MCG/ACT inhaler Inhale 2 puffs into the lungs every 6 (six) hours as needed. 07/27/18   [name], MD  ALPRAZolam Prudy Feeler) 0.25 MG tablet TAKE 1 TABLET(0.25 MG) BY MOUTH TWICE DAILY AS NEEDED FOR ANXIETY 11/09/18   Tillman Abide I, MD  aspirin 81 MG tablet Take 81 mg by mouth daily.      [provider]  atorvastatin (LIPITOR) 20 MG tablet TAKE 1 TABLET BY MOUTH EVERY DAY 05/29/18   [name], MD  B-D UF III MINI PEN NEEDLES 31G X 5 MM MISC USE TO INJECT INSULIN FOUR TIMES DAILY AS DIRECTED 09/03/18   [name], MD  B-D ULTRA-FINE 33 LANCETS MISC Use to test blood  sugar twice daily dx: E11.9 11/17/14   Tillman Abide I, MD  calcitonin, salmon, (MIACALCIN/FORTICAL) 200 UNIT/ACT nasal spray USE 1 SPRAY IN THE NOSE EVERY DAY(ALTERNATING NOSTRILS) 06/29/18   [name], MD  CALCIUM-VITAMIN D PO Take 1 tablet by mouth daily.      [provider]  cetirizine (ZYRTEC) 1 MG/ML syrup Take 10 mg by mouth daily.    [provider]  Chromium 100 MCG TABS Take by mouth daily.      [provider]  Coenzyme Q10 200 MG capsule Take 200 mg  by mouth daily.      [name], MD  fish oil-omega-3 fatty acids 1000 MG capsule Take 1 g by mouth daily.      [provider]  fluticasone (FLONASE) 50 MCG/ACT nasal spray Place 2 sprays into both nostrils daily.    [provider]  folic acid (FOLVITE) 1 MG tablet Take 1 mg by mouth daily.      [provider]  gabapentin (NEURONTIN) 100 MG capsule Take 1 capsule (100 mg total) by mouth 3 (three) times daily. 12/18/18   [name], MD  hydrocortisone 2.5 % cream Apply topically 3 (three) times daily as needed. 12/10/18   [name], MD  Insulin Glargine (LANTUS SOLOSTAR) 100 UNIT/ML Solostar Pen INJECT 22 UNITS UNDER THE SKIN EVERY MORNING. 04/30/15   [provider]  levothyroxine (SYNTHROID, LEVOTHROID) 100 MCG tablet Take 100 mcg by mouth daily before breakfast.    [provider]  meclizine (ANTIVERT) 25 MG tablet TAKE 1 TABLET BY MOUTH THREE TIMES DAILY AS NEEDED 01/25/17   Tillman Abide I, MD  metFORMIN (GLUCOPHAGE) 1000 MG tablet TAKING 2 IN THE MORNING AND 2 IN EVENING    [provider]  metoprolol succinate (TOPROL-XL) 25 MG 24 hr tablet TAKE 1 TABLET(25 MG) BY MOUTH DAILY 05/15/18   Tillman Abide I, MD  montelukast (SINGULAIR) 10 MG tablet TAKE 1 TABLET BY MOUTH EVERY NIGHT AT BEDTIME 07/21/16   [name], MD  Multiple Vitamin (MULTIVITAMIN) tablet Take 1 tablet by mouth daily.    [provider]  omeprazole (PRILOSEC) 20 MG capsule TAKE 1 CAPSULE BY MOUTH TWICE DAILY BEFORE A MEAL 04/05/18   [name], MD  valACYclovir (VALTREX) 1000 MG tablet Take 1 tablet (1,000 mg total) by mouth 3 (three) times daily. 12/10/18   [name], MD  vitamin B-12 (CYANOCOBALAMIN) 1000 MCG tablet Take 1,000 mcg by mouth daily.    [provider]  vitamin C (ASCORBIC ACID) 500 MG tablet Take 500 mg by mouth daily.    [provider]    Allergies Cefdinir; Ciprofloxacin; Clarithromycin;  Miglitol; Niacin; and Qvar [beclomethasone]  Family History  Problem Relation Age of Onset  . Asthma Mother   . Coronary artery disease Father     Social History Social History   Tobacco Use  . Smoking status: Never Smoker  . Smokeless tobacco: Never Used  Substance Use Topics  . Alcohol use: Yes    Alcohol/week: 0.0 standard drinks    Comment: 1 glass of wine per day  . Drug use: No    Review of Systems Constitutional: No fever/chills Eyes: No visual changes. ENT: No sore throat. Cardiovascular: Denies chest pain. Respiratory: Positive for shortness of breath. Gastrointestinal: No abdominal pain.  No nausea, no vomiting.  No diarrhea.   Genitourinary: Negative for dysuria. Musculoskeletal: Positive for chronic back pain Skin: Positive for shingles rash  which is resolving Neurological: Negative for headaches, focal weakness or numbness.  ____________________________________________   PHYSICAL EXAM:  VITAL SIGNS: ED Triage Vitals  Enc Vitals Group     BP 01/15/19 1338 134/67     Pulse Rate 01/15/19 1338 73     Resp 01/15/19 1338 18     Temp 01/15/19 1338 97.9 F (36.6 C)     Temp Source 01/15/19 1338 Oral     SpO2 01/15/19 1338 94 %     Weight 01/15/19 1339 180 lb (81.6 kg)     Height 01/15/19 1339 5\' 4"  (1.626 m)     Head Circumference --      Peak Flow --      Pain Score 01/15/19 1339 0   Constitutional: Alert and oriented.  Eyes: Conjunctivae are normal.  ENT      Head: Normocephalic and atraumatic.      Nose: No congestion/rhinnorhea.      Mouth/Throat: Mucous membranes are moist.      Neck: No stridor. Hematological/Lymphatic/Immunilogical: No cervical lymphadenopathy. Cardiovascular: Normal rate, regular rhythm.  No murmurs, rubs, or gallops.  Respiratory: Normal respiratory effort without tachypnea nor retractions. Breath sounds are clear and equal bilaterally. No wheezes/rales/rhonchi. Gastrointestinal: Soft and non tender. No rebound. No  guarding.  Genitourinary: Deferred Musculoskeletal: Normal range of motion in all extremities. No lower extremity edema. Neurologic:  Normal speech and language. No gross focal neurologic deficits are appreciated.  Skin:  Skin is warm, dry and intact. No rash noted. Psychiatric: Mood and affect are normal. Speech and behavior are normal. Patient exhibits appropriate insight and judgment.  ____________________________________________    LABS (pertinent positives/negatives)  Trop <0.03 CBC wbc 7.4, hgb 13.0, plt 243 BMP wnl except co2 20, glu 182, calcium 8.7  ____________________________________________   EKG  I, Phineas Semen, attending physician, personally viewed and interpreted this EKG  EKG Time: 1345 Rate: 73 Rhythm: normal sinus rhythm Axis: normal Intervals: qtc 462 QRS: incomplete RBBB ST changes: no st elevation Impression: abnormal ekg  ____________________________________________    RADIOLOGY  CXR No acute abnormality  ____________________________________________   PROCEDURES  Procedures  ____________________________________________   INITIAL IMPRESSION / ASSESSMENT AND PLAN / ED COURSE  Pertinent labs & imaging results that were available during my care of the patient were reviewed by me and considered in my medical decision making (see chart for details).   Patient presented to the emergency department today because of concerns for intermittent episodes of shortness of breath.  First occurred 2 days ago and then she had a couple more episodes today.  At the time my exam patient stated she felt well.  The patient lungs and heart were clear on auscultation.  Work-up without concerning chest x-ray EKG or blood work.  I discussed this findings with the patient.  At this point given the patient feels better do not feel any further emergent work-up is needed.  I doubt ACS given negative troponin and EKG.  Doubt PE or dissection given clinical history and  exam.  Do wonder if esophageal issue or esophagitis playing a role.  I discussed this with the patient.  Courage primary care follow-up.   ____________________________________________   FINAL CLINICAL IMPRESSION(S) / ED DIAGNOSES  Final diagnoses:  Shortness of breath     Note: This dictation was prepared with Dragon dictation. Any transcriptional errors that result from this process are unintentional     Phineas Semen, MD 01/15/19 1758

## 2019-01-16 ENCOUNTER — Telehealth: Payer: Self-pay

## 2019-01-16 NOTE — Telephone Encounter (Signed)
Spoke to pt's daughter per DPR. She is doing much better. She is feeling better. Pt may call and set up a follow-up visit.

## 2019-01-17 DIAGNOSIS — E039 Hypothyroidism, unspecified: Secondary | ICD-10-CM | POA: Diagnosis not present

## 2019-01-17 DIAGNOSIS — Z794 Long term (current) use of insulin: Secondary | ICD-10-CM | POA: Diagnosis not present

## 2019-01-17 DIAGNOSIS — E1159 Type 2 diabetes mellitus with other circulatory complications: Secondary | ICD-10-CM | POA: Diagnosis not present

## 2019-01-17 DIAGNOSIS — E1169 Type 2 diabetes mellitus with other specified complication: Secondary | ICD-10-CM | POA: Diagnosis not present

## 2019-01-17 DIAGNOSIS — I10 Essential (primary) hypertension: Secondary | ICD-10-CM | POA: Diagnosis not present

## 2019-01-17 DIAGNOSIS — E785 Hyperlipidemia, unspecified: Secondary | ICD-10-CM | POA: Diagnosis not present

## 2019-01-17 DIAGNOSIS — E1165 Type 2 diabetes mellitus with hyperglycemia: Secondary | ICD-10-CM | POA: Diagnosis not present

## 2019-01-18 ENCOUNTER — Other Ambulatory Visit: Payer: Self-pay | Admitting: Internal Medicine

## 2019-01-18 NOTE — Telephone Encounter (Signed)
Last filled 11-09-18 #60 Last OV 12-10-18 Next OV 01-24-19 [facility] [address]

## 2019-01-24 ENCOUNTER — Ambulatory Visit: Payer: Medicare PPO | Admitting: Internal Medicine

## 2019-02-08 ENCOUNTER — Encounter: Payer: Medicare PPO | Admitting: Internal Medicine

## 2019-02-19 ENCOUNTER — Other Ambulatory Visit: Payer: Self-pay | Admitting: Internal Medicine

## 2019-04-01 ENCOUNTER — Telehealth: Payer: Self-pay

## 2019-04-01 NOTE — Telephone Encounter (Signed)
Spoke with patient's daughter to get an update on how she is feeling.  Patient did not go in for evaluation for symptoms over the weekend,   Daughter states that her mother is doing so much better today.  She has been using Tussin DM and a humidifier with her over the weekend.  Her congestion has lessened and she is not coughing any longer and reports feeling better.  She never ran a fever and no breathing difficulty.  I did offer a virtual visit with a provider either today or tomorrow and also could determine if need for COVID testing.  Daughter refused at this point stating her mother is better and she will monitor her carefully.  If symptoms don't continue to improve she will call us back for a virtual follow up.  FYI to R. Sampson Si, NP as Dr. Alphonsus Sias is out of the office but will also cc him on this as well and make Carollee Herter, his CMA aware.

## 2019-04-01 NOTE — Telephone Encounter (Signed)
[facility] - Client TELEPHONE ADVICE RECORD [facility] Patient Name: [name] Gender: Female DOB: Jan 11, 1931 Age: 85 Y 2 M Return Phone Number: [phone] (Primary), [phone] (Secondary), [phone] (Alternate) Address: City/State/Zip: [place] [id number] Client [facility] - Night Physician Tillman AbideLetvak, Richard - MD Contact Type Call Who Is Calling Patient / Member / Family / Caregiver Call Type Triage / Clinical Caller Name Thomasene MohairMelinda Gartin Relationship To Patient Daughter Return Phone Number [phone] (Secondary) Chief Complaint BREATHING - fast, heavy or wheezing Reason for Call Symptomatic / Request for Health Information Initial Comment Caller states her mother is high risk of COVID symptoms. She has a cough. No shortness of breath. She is wheezing. She has a runny nose. No fever. She is diabetic. Translation No Nurse Assessment Nurse: Laural BenesJohnson, RN, Dondra SpryGail Date/Time Lamount Cohen(Eastern Time): 03/30/2019 1:33:08 PM Confirm and document reason for call. If symptomatic, describe symptoms. ---[name] has cough, but no shortness of breath. She is wheezing a little. She has a runny nose. no fever Has the patient had close contact with a person known or suspected to have the novel coronavirus illness OR traveled / lives in area with major community spread (including international travel) in the last 14 days from the onset of symptoms? * If Asymptomatic, screen for exposure and travel within the last 14 days. ---No Does the patient have any new or worsening symptoms? ---Yes Will a triage be completed? ---Yes Related visit to physician within the last 2 weeks? ---No Does the PT have any chronic conditions? (i.e. diabetes, asthma, this includes High risk factors for pregnancy, etc.) ---Yes List chronic conditions. ---Diabetic Is this a behavioral health  or substance abuse call? ---No Guidelines Guideline Title Affirmed Question Affirmed Notes Nurse Date/Time (Eastern Time) Cough - Chronic [1] Continuous (nonstop) coughing interferes with work or school AND [2] no improvement using Laural BenesJohnson, Teacher, adult educationN, Gail 03/30/2019 1:36:08 PM PLEASE NOTE: All timestamps contained within this report are represented as [place] Standard Time. CONFIDENTIALTY NOTICE: This fax transmission is intended only for the addressee. It contains information that is legally privileged, confidential or otherwise protected from use or disclosure. If you are not the intended recipient, you are strictly prohibited from reviewing, disclosing, copying using or disseminating any of this information or taking any action in reliance on or regarding this information. If you have received this fax in error, please notify us immediately by telephone so that we can arrange for its return to us. Phone: [phone], Toll-Free: [phone], Fax: [phone] Page: 2 of 2 Call Id: [id number] Guidelines Guideline Title Affirmed Question Affirmed Notes Nurse Date/Time Lamount Cohen(Eastern Time) cough treatment per protocol Disp. Time Lamount Cohen(Eastern Time) Disposition Final User 03/30/2019 1:31:58 PM Send to Urgent Queue Ardis HughsWalker, Donna 03/30/2019 1:47:34 PM See PCP within 24 Hours Yes Laural BenesJohnson, RN, Suzi RootsGail Caller Disagree/Comply Comply Caller Understands Yes PreDisposition Call Doctor Care Advice Given Per Guideline SEE PCP WITHIN 24 HOURS: * Drink warm fluids. Inhale warm mist. (Reason: both relax the airway and loosen up the phlegm) * Suck on cough drops or hard candy to coat the irritated throat. * OTC COUGH SYRUPS: The most common cough suppressant in OTC cough medications is dextromethorphan. Often the letters 'DM' appear in the name. * If the air is dry, use a humidifier in the bedroom. * Dry air makes coughs worse. * Difficulty breathing occurs * You become worse. After Care Instructions Given Call Event  Type User  Date / Time Description Education document email Liberty Handy 03/30/2019 1:45:23 PM Cough - Viral (Acute Bronchitis) Referrals GO TO FACILITY UNDECIDED

## 2019-04-02 NOTE — Telephone Encounter (Signed)
Noted  

## 2019-04-02 NOTE — Telephone Encounter (Signed)
Please check on her again in the next couple of days

## 2019-04-02 NOTE — Telephone Encounter (Signed)
noted 

## 2019-04-03 NOTE — Telephone Encounter (Signed)
Spoke to pt. She said her cough is better. She is feeling better.

## 2019-04-05 NOTE — Telephone Encounter (Signed)
Spoke with patient and reviewed instructions.  She will not plan on taking the antibiotics unless she gets worse or starts to run a fever.   She will call us on Tuesday and give [place] an update on how she is feeling.   She understands if symptoms get significantly worse with breathing difficulty then she is to go to the urgent care or ER for eval.

## 2019-04-05 NOTE — Telephone Encounter (Addendum)
Pt called and has 07/27/2018 Amox-clav 875 mg and has the entire prescription with instructions to be taken if necessary; pt is sure she has bronchitis and wants to know if can take the abx. Pt had been doing well but had coughing episode last night and robitussin did beautifully. Pt wants to know if starts with fever over the weekend should she start the abx. Pt has not gone anywhere since Feb 2020. Pt request cb today. Dr Alphonsus Sias is out of office until 04/09/19 and I will send to him and Pamala Hurry NP.

## 2019-04-05 NOTE — Telephone Encounter (Signed)
If she is better, I would not take the antibiotics. If she worsens, she could take the Augmentin but I would recommend she do a virtual visit if worse, prior to starting any antibiotics she has at home.

## 2019-04-07 NOTE — Telephone Encounter (Signed)
Please follow up on Tuesday

## 2019-04-09 NOTE — Telephone Encounter (Signed)
Spoke to [place], daughter.  Spoke to pt. She said she feels a lot better today. Thinks she has turned the corner. No fever.

## 2019-04-11 ENCOUNTER — Ambulatory Visit (INDEPENDENT_AMBULATORY_CARE_PROVIDER_SITE_OTHER): Payer: Medicare PPO | Admitting: Internal Medicine

## 2019-04-11 ENCOUNTER — Ambulatory Visit: Payer: Self-pay

## 2019-04-11 ENCOUNTER — Encounter: Payer: Self-pay | Admitting: Internal Medicine

## 2019-04-11 ENCOUNTER — Other Ambulatory Visit: Payer: Self-pay

## 2019-04-11 DIAGNOSIS — J4521 Mild intermittent asthma with (acute) exacerbation: Secondary | ICD-10-CM | POA: Diagnosis not present

## 2019-04-11 MED ORDER — PREDNISONE 20 MG PO TABS: 40.0000 mg | ORAL_TABLET | Freq: Every day | ORAL | 0 refills | Status: DC

## 2019-04-11 NOTE — Assessment & Plan Note (Signed)
Symptoms for 2 weeks Not bad--but not getting better Will try empiric augmentin and a few days of prednisone She knows to go to ER if really worse (but obviously hopes to avoid that)

## 2019-04-11 NOTE — Telephone Encounter (Signed)
Patient scheduled phone visit for today.

## 2019-04-11 NOTE — Telephone Encounter (Signed)
Pt said she is prone to bronchial infections in spring and fall; pt coughing for 2 wks.prod cough with clear phlegm.occasional chills,S/T, slight SOB at nighttime.No fever,diarrhea,muscle pain,?  loss of taste/smell. Pt taking robitussin.Pt wants to see Dr Alphonsus Sias in office.pt seen 07/27/18 and pt was given augmentin which pt got filled but has not taken. Pt wants to know if should start the augmentin or should pt begin taking prednisone.pt [facility] [address].

## 2019-04-11 NOTE — Telephone Encounter (Signed)
Have her set up visit tomorrow morning (DOXY if possible or phone)----- we can discuss Rx at that point (I have 8 and 8:15 open)

## 2019-04-11 NOTE — Telephone Encounter (Signed)
Incoming call from Pt.  With complaint of chronic cough.  Occasionally   Brings  Up clear fluid.   Patient feel there is a like a lump Right bronchial  .  Patent requesting  An appointment .    Reason for Disposition . [1] Continuous (nonstop) coughing interferes with work or school AND [2] no improvement using cough treatment per Care Advice  Answer Assessment - Initial Assessment Questions 1. ONSET: "When did the cough begin?"      Denies fever 2. SEVERITY: "How bad is the cough today?"      moderate 3. RESPIRATORY DISTRESS: "Describe your breathing."      denies 4. FEVER: "Do you have a fever?" If so, ask: "What is your temperature, how was it measured, and when did it start?"     *No Answer* 5. SPUTUM: "Describe the color of your sputum" (clear, white, yellow, green)     *clear 6. HEMOPTYSIS: "Are you coughing up any blood?" If so ask: "How much?" (flecks, streaks, tablespoons, etc.)     denies 7. CARDIAC HISTORY: "Do you have any history of heart disease?" (e.g., heart attack, congestive heart failure)      Afib 8. LUNG HISTORY: "Do you have any history of lung disease?"  (e.g., pulmonary embolus, asthma, emphysema)     Black spots on lungs 9. PE RISK FACTORS: "Do you have a history of blood clots?" (or: recent major surgery, recent prolonged travel, bedridden)     *No Answer* 10. OTHER SYMPTOMS: "Do you have any other symptoms?" (e.g., runny nose, wheezing, chest pain)       Wheezing.   11. PREGNANCY: "Is there any chance you are pregnant?" "When was your last menstrual period?"       *No Answer* 12. TRAVEL: "Have you traveled out of the country in the last month?" (e.g., travel history, exposures)       *No Answer*  Protocols used: COUGH - ACUTE PRODUCTIVE-A-AH

## 2019-04-11 NOTE — Progress Notes (Addendum)
Subjective:    Patient ID: [name], female    DOB: 02-16-1931, 85 y.o.   MRN: [id number]  HPI Visit for persistent cough Daughter not available so couldn't do video  Interactive audio and video telecommunications were attempted between this provider and patient, however failed, due to patient having technical difficulties OR patient did not have access to video capability.  We continued and completed visit with audio only.   Virtual Visit via Telephone Note  I connected with [name] on 04/11/19 at  3:30 PM EDT by telephone and verified that I am speaking with the correct person using two identifiers.  Location: Patient: home Provider: office   I discussed the limitations, risks, security and privacy concerns of performing an evaluation and management service by telephone and the availability of in person appointments. I also discussed with the patient that there may be a patient responsible charge related to this service. The patient expressed understanding and agreed to proceed.   History of Present Illness: Cough goes back for 2 weeks Seems like her typical fall/spring bronchitis Gets spasms of cough at times Cough somewhat productive--but mostly swallows it No fever Will get some chilled feeling when she coughs at night--but no sweats and it is brief Not really SOB---did check oximetry and it was 94% on RA Feels a little "dried up" in her right bronchial tubes. No clear wheezing  No N/V Eating okay  Current Outpatient Medications on File Prior to Visit  Medication Sig Dispense Refill  . ACCU-CHEK SMARTVIEW test strip TEST BLOOD SUGAR BID UTD  3  . albuterol (PROVENTIL HFA;VENTOLIN HFA) 108 (90 Base) MCG/ACT inhaler Inhale 2 puffs into the lungs every 6 (six) hours as needed. 18 g 1  . ALPRAZolam (XANAX) 0.25 MG tablet TAKE 1 TABLET(0.25 MG) BY MOUTH TWICE DAILY AS NEEDED FOR ANXIETY 60 tablet 0  . aspirin 81 MG tablet Take 81 mg by mouth daily.      Marland Kitchen  atorvastatin (LIPITOR) 20 MG tablet TAKE 1 TABLET BY MOUTH EVERY DAY 90 tablet 3  . B-D UF III MINI PEN NEEDLES 31G X 5 MM MISC USE TO INJECT INSULIN FOUR TIMES DAILY AS DIRECTED 200 each 1  . B-D ULTRA-FINE 33 LANCETS MISC Use to test blood sugar twice daily dx: E11.9 200 each 3  . calcitonin, salmon, (MIACALCIN/FORTICAL) 200 UNIT/ACT nasal spray USE 1 SPRAY IN THE NOSE EVERY DAY(ALTERNATING NOSTRILS) 3.7 mL 11  . CALCIUM-VITAMIN D PO Take 1 tablet by mouth daily.      . cetirizine (ZYRTEC) 1 MG/ML syrup Take 10 mg by mouth daily.    . Chromium 100 MCG TABS Take by mouth daily.      . Coenzyme Q10 200 MG capsule Take 200 mg by mouth daily.      . fish oil-omega-3 fatty acids 1000 MG capsule Take 1 g by mouth daily.      . fluticasone (FLONASE) 50 MCG/ACT nasal spray Place 2 sprays into both nostrils daily.    . folic acid (FOLVITE) 1 MG tablet Take 1 mg by mouth daily.      Marland Kitchen gabapentin (NEURONTIN) 100 MG capsule Take 1 capsule (100 mg total) by mouth 3 (three) times daily. 90 capsule 0  . hydrocortisone 2.5 % cream Apply topically 3 (three) times daily as needed. 28 g 3  . Insulin Glargine (LANTUS SOLOSTAR) 100 UNIT/ML Solostar Pen INJECT 22 UNITS UNDER THE SKIN EVERY MORNING.    Marland Kitchen levothyroxine (SYNTHROID, LEVOTHROID) 100  MCG tablet Take 100 mcg by mouth daily before breakfast.    . meclizine (ANTIVERT) 25 MG tablet TAKE 1 TABLET BY MOUTH THREE TIMES DAILY AS NEEDED 90 tablet 1  . metFORMIN (GLUCOPHAGE) 1000 MG tablet TAKING 2 IN THE MORNING AND 2 IN EVENING    . metoprolol succinate (TOPROL-XL) 25 MG 24 hr tablet TAKE 1 TABLET(25 MG) BY MOUTH DAILY 90 tablet 3  . montelukast (SINGULAIR) 10 MG tablet TAKE 1 TABLET BY MOUTH EVERY NIGHT AT BEDTIME 90 tablet 3  . Multiple Vitamin (MULTIVITAMIN) tablet Take 1 tablet by mouth daily.    Marland Kitchen. omeprazole (PRILOSEC) 20 MG capsule TAKE 1 CAPSULE BY MOUTH TWICE DAILY BEFORE A MEAL 180 capsule 3  . valACYclovir (VALTREX) 1000 MG tablet Take 1 tablet (1,000  mg total) by mouth 3 (three) times daily. 21 tablet 1  . vitamin B-12 (CYANOCOBALAMIN) 1000 MCG tablet Take 1,000 mcg by mouth daily.    . vitamin C (ASCORBIC ACID) 500 MG tablet Take 500 mg by mouth daily.     No current facility-administered medications on file prior to visit.     Allergies  Allergen Reactions  . Cefdinir     REACTION: unspecified  . Ciprofloxacin     rash  . Clarithromycin     REACTION: weird dreams (on prednisone at the same time but has tolerated this in the past)  . Miglitol     REACTION: unspecified  . Niacin     REACTION: unspecified  . Qvar [Beclomethasone] Other (See Comments)    Feels funny Feels funny    Past Medical History:  Diagnosis Date  . Allergy   . Asthma   . Atrial fibrillation (HCC)   . Diabetes mellitus   . GERD (gastroesophageal reflux disease)   . Hyperlipidemia   . Osteopenia   . Sleep disorder   . Thyroid disease     Past Surgical History:  Procedure Laterality Date  . CERVICAL CONE BIOPSY  1960's  . CHOLECYSTECTOMY    . NASAL POLYP SURGERY  01/01  . STRABISMUS SURGERY  1946 / [id number]  . VAGINAL DELIVERY     x2    Family History  Problem Relation Age of Onset  . Asthma Mother   . Coronary artery disease Father     Social History   Socioeconomic History  . Marital status: Married    Spouse name: Not on file  . Number of children: 2  . Years of education: Not on file  . Highest education level: Not on file  Occupational History    Employer: RETIRED  Social Needs  . Financial resource strain: Not on file  . Food insecurity:    Worry: Not on file    Inability: Not on file  . Transportation needs:    Medical: Not on file    Non-medical: Not on file  Tobacco Use  . Smoking status: Never Smoker  . Smokeless tobacco: Never Used  Substance and Sexual Activity  . Alcohol use: Yes    Alcohol/week: 0.0 standard drinks    Comment: 1 glass of wine per day  . Drug use: No  . Sexual activity: Not on file   Lifestyle  . Physical activity:    Days per week: Not on file    Minutes per session: Not on file  . Stress: Not on file  Relationships  . Social connections:    Talks on phone: Not on file    Gets together: Not on file  Attends religious service: Not on file    Active member of club or organization: Not on file    Attends meetings of clubs or organizations: Not on file    Relationship status: Not on file  . Intimate partner violence:    Fear of current or ex partner: Not on file    Emotionally abused: Not on file    Physically abused: Not on file    Forced sexual activity: Not on file  Other Topics Concern  . Not on file  Social History Narrative   Has living will   Not sure about formal POA --but requests husband, then daughter [name] (and son [name]) to be health care POAs   Would accept resuscitation attempts   Not sure about feeding tubes     Observations/Objective: Normal speech Slight cough with talkig Seems to be able to breathe okay  Assessment and Plan: See problem list  Follow Up Instructions:    I discussed the assessment and treatment plan with the patient. The patient was provided an opportunity to ask questions and all were answered. The patient agreed with the plan and demonstrated an understanding of the instructions.   The patient was advised to call back or seek an in-person evaluation if the symptoms worsen or if the condition fails to improve as anticipated.  I provided  12 minutes of non-face-to-face time during this encounter.   Tillman Abide, MD    Review of Systems     Objective:   Physical Exam         Assessment & Plan:

## 2019-04-15 ENCOUNTER — Telehealth: Payer: Self-pay

## 2019-04-15 NOTE — Telephone Encounter (Signed)
Tried to reach patient and daughter to follow up on how patient is doing.   LM at [phone] to please call us back with an update on how you are doing.

## 2019-04-15 NOTE — Telephone Encounter (Signed)
Please try to reach her tomorrow.

## 2019-04-15 NOTE — Telephone Encounter (Signed)
[facility] Night - Client TELEPHONE ADVICE RECORD [facility] Patient Name: Aurelio BrashRMGARD Andreoli Gender: Female DOB: 16-Jan-1931 Age: [age] Y 2 M 14 D Return Phone Number: [phone] (Primary), [phone] (Secondary), [phone] (Alternate) Address: City/State/Zip: [place] [id number] Client [facility] Night - Client [facility] - Night Physician Tillman AbideLetvak, Richard - MD Contact Type Call Who Is Calling Patient / Member / Family / Caregiver Call Type Triage / Clinical Caller Name Thomasene MohairMelinda Krauser Relationship To Patient Daughter Return Phone Number [phone] (Alternate) Chief Complaint Medication reaction Reason for Call Symptomatic / Request for Health Information Initial Comment Caller stated her mom woke up feeling like her blood pressure was high. She stated she was feeling really hot. She stated her blood pressure was 190/93 and pulse was 63. She stated she was put on Presidone and Amox-Clav for bronchitis two days ago. She is worried it a drug interaction. She stated she is currently taking Insulin, Metformin, Montelukast, Atorvastatin, Metoprolol, Ometrasol and Levothyroxine and a low dose aspirin Translation No Nurse Assessment Nurse: Mauro KaufmannHalpin, RN, Randa EvensJacklyn Date/Time (Eastern Time): 04/13/2019 11:50:52 AM Confirm and document reason for call. If symptomatic, describe symptoms. ---Caller states that her mom has bronchitis and was given amoxicillin and prednisone on the 28th of may. this morning blood pressure was really high, face bright red and hot. 11am 190/93. Took her at medication. B/P 166/80 now. Worried that there may be an interaction with he medication that she is currently taking mixed in with the new medications. Allergic to Cipro-rash, niacin- whelps. Medications listed: Insulin, Metformin, Montelukast, Atorvastatin, Metoprolol, Omeprazole and Levothyroxine and a low  dose aspirin Has the patient had close contact with a person known or suspected to have the novel coronavirus illness OR traveled / lives in area with major community spread (including international travel) in the last 14 days from the onset of symptoms? * If Asymptomatic, screen for exposure and travel within the last 14 days. ---No Does the patient have any new or worsening symptoms? ---Yes Will a triage be completed? ---Yes Related visit to physician within the last 2 weeks? ---Yes Does the PT have any chronic conditions? (i.e. diabetes, asthma, this includes High risk factors for pregnancy, etc.) ---Yes PLEASE NOTE: All timestamps contained within this report are represented as [place] Standard Time. CONFIDENTIALTY NOTICE: This fax transmission is intended only for the addressee. It contains information that is legally privileged, confidential or otherwise protected from use or disclosure. If you are not the intended recipient, you are strictly prohibited from reviewing, disclosing, copying using or disseminating any of this information or taking any action in reliance on or regarding this information. If you have received this fax in error, please notify us immediately by telephone so that we can arrange for its return to us. Phone: [phone], Toll-Free: [phone], Fax: [phone] Page: 2 of 2 Call Id: [id number] Nurse Assessment List chronic conditions. ---diabetes, bronchitis, HTN, stenosis of right cerebral artery Is this a behavioral health or substance abuse call? ---No Guidelines Guideline Title Affirmed Question Affirmed Notes Nurse Date/Time (Eastern Time) High Blood Pressure Systolic BP >= 180 OR Diastolic >= 110 Halpin, RN, Jacklyn 04/13/2019 12:04:14 PM Disp. Time Lamount Cohen(Eastern Time) Disposition Final User 04/13/2019 12:11:18 PM See PCP within 24 Hours Yes Halpin, RN, Randa EvensJacklyn Caller Disagree/Comply Comply Caller Understands Yes PreDisposition Home Care Care Advice  Given Per Guideline * IF OFFICE WILL BE CLOSED AND NO PCP (PRIMARY CARE PROVIDER) SECOND-LEVEL TRIAGE: You  need to be seen within the next 24 hours. A clinic or an urgent care center is often a good source of care if your doctor's office is closed or you can't get an appointment. SEE PCP WITHIN 24 HOURS: HIGH BLOOD PRESSURE: * Untreated high blood pressure may cause damage to your heart, brain, kidneys, and eyes. * Treatment of high blood pressure can reduce the risk of stroke, heart attack, and heart failure. * The goal of blood pressure treatment for most people with hypertension is to keep the blood pressure under 140/90. For people that are 60 years or older, your doctor may instead want to keep the blood pressure under 150/90. CALL BACK IF: * Weakness or numbness of the face, arm or leg on one side of the body occurs * Difficulty walking, difficulty talking, or severe headache occurs * Chest pain or difficulty breathing occurs * You become worse. CARE ADVICE given per High Blood Pressure (Adult) guideline. Referrals REFERRED TO PCP OFFICE [facility]

## 2019-04-16 NOTE — Telephone Encounter (Signed)
Molli Knock Glad to hear she is improving The sugars should come down as the prednisone ends

## 2019-04-16 NOTE — Telephone Encounter (Signed)
Spoke to pt. No fever. BP has gone down. 152/92 at 3pm yesterday.  156/78 just now. She is on prednisone right now. Blood sugar was over 400 at one point. BP seems to be getting better with the decrease of Prednisone. She said she feels ok. Not coughing as much.

## 2019-05-02 ENCOUNTER — Other Ambulatory Visit: Payer: Self-pay | Admitting: Internal Medicine

## 2019-05-14 DIAGNOSIS — J340 Abscess, furuncle and carbuncle of nose: Secondary | ICD-10-CM | POA: Diagnosis not present

## 2019-05-22 ENCOUNTER — Other Ambulatory Visit: Payer: Self-pay | Admitting: Internal Medicine

## 2019-05-27 ENCOUNTER — Telehealth: Payer: Self-pay

## 2019-05-27 NOTE — Telephone Encounter (Signed)
[place] Night - Client TELEPHONE ADVICE RECORD [facility] Patient Name: [name] Gender: Female DOB: 19-Apr-1931 Age: 85 Y 3 M 26 D Return Phone Number: [phone] (Primary) Address: City/State/Zip: [place] [address] Client [facility] Night - Client Client Site [place] - Night Contact Type Call Who Is Calling Patient / Member / Family / Caregiver Call Type Triage / Clinical Caller Name Lonnie Rosado Relationship To Patient Daughter Return Phone Number [phone] (Primary) Chief Complaint Health information question (non symptomatic) Reason for Call Symptomatic / Request for [place] states that her mother woke up and had some blurry vision. It came back but she does have stenosis in her cerebral artery and wanted to double check. Translation No Nurse Assessment Nurse: Hassell Done, RN, Melanie Date/Time (Eastern Time): 05/26/2019 11:11:44 AM Confirm and document reason for call. If symptomatic, describe symptoms. ---Caller states she woke up with blurry vision and when she looked in the mirror it was like her glasses were fogged up. she thinks it might have been dryness in her eyes. Has the patient had close contact with a person known or suspected to have the novel coronavirus illness OR traveled / lives in area with major community spread (including international travel) in the last 14 days from the onset of symptoms? * If Asymptomatic, screen for exposure and travel within the last 14 days. ---Not Applicable Does the patient have any new or worsening symptoms? ---Yes Will a triage be completed? ---Yes Related visit to physician within the last 2 weeks? ---No Does the PT have any chronic conditions? (i.e. diabetes, asthma, this includes High risk factors for pregnancy, etc.) ---Yes List chronic conditions. ---diabetes stenosis in carotid artery asthma Is  this a behavioral health or substance abuse call? ---No Guidelines Guideline Title Affirmed Question Affirmed Notes Nurse Date/Time (Eastern Time) Vision Loss or Change [1] Brief (now gone) blurred vision AND [2] unexplained Hassell Done, RN, Threasa Beards 05/26/2019 11:15:04 AM Disp. Time Eilene Ghazi Time) Disposition Final User PLEASE NOTE: All timestamps contained within this report are represented as [place] Standard Time. CONFIDENTIALTY NOTICE: This fax transmission is intended only for the addressee. It contains information that is legally privileged, confidential or otherwise protected from use or disclosure. If you are not the intended recipient, you are strictly prohibited from reviewing, disclosing, copying using or disseminating any of this information or taking any action in reliance on or regarding this information. If you have received this fax in error, please notify us immediately by telephone so that we can arrange for its return to [place]. Phone: [phone], Toll-Free: [phone], Fax: [phone] Page: 2 of 2 Call Id: [id number] 05/26/2019 11:18:06 AM SEE PCP WITHIN 3 DAYS Yes Hassell Done, RN, Threasa Beards Caller Disagree/Comply Comply Caller Understands Yes PreDisposition Call Doctor Care Advice Given Per Guideline SEE PCP WITHIN 3 DAYS: * You need to be seen within 2 or 3 days. Call your doctor (or NP/PA) during regular office hours and make an appointment. A clinic or urgent care center are good places to go for care if your doctor's office is closed or you can't get an appointment. NOTE: If office will be open tomorrow, tell caller to call then, not in 3 days. CALL BACK IF: * Blurred vision recurs * You develop other problems with vision. CARE ADVICE given per Vision Loss or Change (Adult) guideline. Referrals REFERRED TO PCP OFFICE

## 2019-05-27 NOTE — Telephone Encounter (Signed)
Appointment made for tomorrow

## 2019-05-27 NOTE — Telephone Encounter (Signed)
I called pt and was told that someone was on the other line scheduling an appt for pt with Dr Silvio Pate. Nothing further that I needed to do.

## 2019-05-28 ENCOUNTER — Encounter: Payer: Self-pay | Admitting: Internal Medicine

## 2019-05-28 ENCOUNTER — Ambulatory Visit (INDEPENDENT_AMBULATORY_CARE_PROVIDER_SITE_OTHER): Payer: Medicare PPO | Admitting: Internal Medicine

## 2019-05-28 ENCOUNTER — Other Ambulatory Visit: Payer: Self-pay

## 2019-05-28 DIAGNOSIS — H539 Unspecified visual disturbance: Secondary | ICD-10-CM

## 2019-05-28 NOTE — Assessment & Plan Note (Signed)
Brief, in both eyes and vague Unlikely vascular or CNS related Hasn't recurred Unlikely to be anything worrisome but it makes sense to see the eye doctor

## 2019-05-28 NOTE — Progress Notes (Signed)
Subjective:    Patient ID: [name], female    DOB: [birth year], 85 y.o.   MRN: [id number]  HPI Here due to a problem with her eyes With daughter [name]  Awoke 2 days ago---"my eyes were fogged up" Lasted about 2 minutes--and finally cleared Also with a "low grade" pain along right mandible--and towards teeth. This started a while back--wondered if they were related Left eye doesn't work well--never recovered from [employer] especially concerned Chronic diplopia due to this  Some trouble with itching in right ear also Bothers her when she wears her hearing aides Dr Tami Ribas recently checked---didn't see any problems there  Also notes sensitive throat--relates to reflux This is not new  Current Outpatient Medications on File Prior to Visit  Medication Sig Dispense Refill  . ACCU-CHEK SMARTVIEW test strip TEST BLOOD SUGAR BID UTD  3  . albuterol (PROVENTIL HFA;VENTOLIN HFA) 108 (90 Base) MCG/ACT inhaler Inhale 2 puffs into the lungs every 6 (six) hours as needed. 18 g 1  . ALPRAZolam (XANAX) 0.25 MG tablet TAKE 1 TABLET(0.25 MG) BY MOUTH TWICE DAILY AS NEEDED FOR ANXIETY 60 tablet 0  . aspirin 81 MG tablet Take 81 mg by mouth daily.      Marland Kitchen atorvastatin (LIPITOR) 20 MG tablet TAKE 1 TABLET BY MOUTH EVERY DAY 90 tablet 3  . B-D UF III MINI PEN NEEDLES 31G X 5 MM MISC USE TO INJECT INSULIN FOUR TIMES DAILY AS DIRECTED 200 each 1  . B-D ULTRA-FINE 33 LANCETS MISC Use to test blood sugar twice daily dx: E11.9 200 each 3  . calcitonin, salmon, (MIACALCIN/FORTICAL) 200 UNIT/ACT nasal spray USE 1 SPRAY IN THE NOSE EVERY DAY(ALTERNATING NOSTRILS) 3.7 mL 11  . CALCIUM-VITAMIN D PO Take 1 tablet by mouth daily.      . cetirizine (ZYRTEC) 1 MG/ML syrup Take 10 mg by mouth daily.    . Chromium 100 MCG TABS Take by mouth daily.      . Coenzyme Q10 200 MG capsule Take 200 mg by mouth daily.      . fish oil-omega-3 fatty acids 1000 MG capsule Take 1 g by mouth daily.      . fluticasone  (FLONASE) 50 MCG/ACT nasal spray Place 2 sprays into both nostrils daily.    . folic acid (FOLVITE) 1 MG tablet Take 1 mg by mouth daily.      Marland Kitchen gabapentin (NEURONTIN) 100 MG capsule Take 1 capsule (100 mg total) by mouth 3 (three) times daily. 90 capsule 0  . gentamicin ointment (GARAMYCIN) 0.1 % Apply 1 application topically 3 (three) times daily.    . hydrocortisone 2.5 % cream Apply topically 3 (three) times daily as needed. 28 g 3  . Insulin Glargine (LANTUS SOLOSTAR) 100 UNIT/ML Solostar Pen INJECT 22 UNITS UNDER THE SKIN EVERY MORNING.    Marland Kitchen levothyroxine (SYNTHROID, LEVOTHROID) 100 MCG tablet Take 100 mcg by mouth daily before breakfast.    . meclizine (ANTIVERT) 25 MG tablet TAKE 1 TABLET BY MOUTH THREE TIMES DAILY AS NEEDED 90 tablet 1  . metFORMIN (GLUCOPHAGE) 1000 MG tablet TAKING 2 IN THE MORNING AND 2 IN EVENING    . metoprolol succinate (TOPROL-XL) 25 MG 24 hr tablet TAKE 1 TABLET(25 MG) BY MOUTH DAILY 90 tablet 3  . montelukast (SINGULAIR) 10 MG tablet TAKE 1 TABLET BY MOUTH EVERY NIGHT AT BEDTIME 90 tablet 3  . Multiple Vitamin (MULTIVITAMIN) tablet Take 1 tablet by mouth daily.    Marland Kitchen omeprazole (PRILOSEC) 20  MG capsule TAKE 1 CAPSULE BY MOUTH TWICE DAILY BEFORE A MEAL 180 capsule 3  . vitamin B-12 (CYANOCOBALAMIN) 1000 MCG tablet Take 1,000 mcg by mouth daily.    . vitamin C (ASCORBIC ACID) 500 MG tablet Take 500 mg by mouth daily.     No current facility-administered medications on file prior to visit.     Allergies  Allergen Reactions  . Cefdinir     REACTION: unspecified  . Ciprofloxacin     rash  . Clarithromycin     REACTION: weird dreams (on prednisone at the same time but has tolerated this in the past)  . Miglitol     REACTION: unspecified  . Niacin     REACTION: unspecified  . Qvar [Beclomethasone] Other (See Comments)    Feels funny Feels funny    Past Medical History:  Diagnosis Date  . Allergy   . Asthma   . Atrial fibrillation (HCC)   . Diabetes  mellitus   . GERD (gastroesophageal reflux disease)   . Hyperlipidemia   . Osteopenia   . Sleep disorder   . Thyroid disease     Past Surgical History:  Procedure Laterality Date  . CERVICAL CONE BIOPSY  1960's  . CHOLECYSTECTOMY    . NASAL POLYP SURGERY  01/01  . STRABISMUS SURGERY  1946 / [id number]  . VAGINAL DELIVERY     x2    Family History  Problem Relation Age of Onset  . Asthma Mother   . Coronary artery disease Father     Social History   Socioeconomic History  . Marital status: Married    Spouse name: Not on file  . Number of children: 2  . Years of education: Not on file  . Highest education level: Not on file  Occupational History    Employer: RETIRED  Social Needs  . Financial resource strain: Not on file  . Food insecurity    Worry: Not on file    Inability: Not on file  . Transportation needs    Medical: Not on file    Non-medical: Not on file  Tobacco Use  . Smoking status: Never Smoker  . Smokeless tobacco: Never Used  Substance and Sexual Activity  . Alcohol use: Yes    Alcohol/week: 0.0 standard drinks    Comment: 1 glass of wine per day  . Drug use: No  . Sexual activity: Not on file  Lifestyle  . Physical activity    Days per week: Not on file    Minutes per session: Not on file  . Stress: Not on file  Relationships  . Social Musicianconnections    Talks on phone: Not on file    Gets together: Not on file    Attends religious service: Not on file    Active member of club or organization: Not on file    Attends meetings of clubs or organizations: Not on file    Relationship status: Not on file  . Intimate partner violence    Fear of current or ex partner: Not on file    Emotionally abused: Not on file    Physically abused: Not on file    Forced sexual activity: Not on file  Other Topics Concern  . Not on file  Social History Narrative   Has living will   Not sure about formal POA --but requests husband, then daughter [name] (and son  [name]) to be health care POAs   Would accept resuscitation attempts   Not  sure about feeding tubes   Review of Systems No fevers No headaches Going to eye doctor next week    Objective:   Physical Exam  Constitutional: She appears well-developed. No distress.  HENT:  Slight dry skin at right ear canal--clear otherwise (suggested cortisone cream) No TMJ tenderness or clicking  Eyes: Pupils are equal, round, and reactive to light. Conjunctivae are normal.  Slight lower lid on left Limited fundus exam  Neck: No thyromegaly present.  No nodes or other findings in mandible, etc  Lymphadenopathy:    She has no cervical adenopathy.           Assessment & Plan:

## 2019-06-07 DIAGNOSIS — E113393 Type 2 diabetes mellitus with moderate nonproliferative diabetic retinopathy without macular edema, bilateral: Secondary | ICD-10-CM | POA: Diagnosis not present

## 2019-06-07 LAB — HM DIABETES EYE EXAM

## 2019-06-14 ENCOUNTER — Telehealth: Payer: Self-pay | Admitting: Internal Medicine

## 2019-06-17 NOTE — Telephone Encounter (Signed)
I spoke to patient's daughter.  She scheduled appointment on 08/22/19 at 2:00.

## 2019-06-17 NOTE — Telephone Encounter (Signed)
Have her set up an AMW in the next few months---it has been well over a year

## 2019-06-17 NOTE — Telephone Encounter (Signed)
Last filled 01-18-19 #60 Last OV 05-28-19 No Future OV [facility] [address]

## 2019-06-18 DIAGNOSIS — E113299 Type 2 diabetes mellitus with mild nonproliferative diabetic retinopathy without macular edema, unspecified eye: Secondary | ICD-10-CM | POA: Insufficient documentation

## 2019-07-12 ENCOUNTER — Other Ambulatory Visit: Payer: Self-pay | Admitting: Internal Medicine

## 2019-07-19 ENCOUNTER — Encounter: Payer: Self-pay | Admitting: Internal Medicine

## 2019-07-19 ENCOUNTER — Telehealth: Payer: Self-pay

## 2019-07-19 NOTE — Telephone Encounter (Signed)
Pt's daughter called saying pt told her yesterday she did not feel well. They checked her BP and it was elevated 180s/80s. They checked it several times and it slowly started coming down. She juest checked it and it was 141,81. Pt stated she felt fine today. They will keep a check on it and let us know if it goes back up and stays there.

## 2019-07-23 ENCOUNTER — Telehealth: Payer: Self-pay | Admitting: Cardiovascular Disease

## 2019-07-23 NOTE — Patient Instructions (Signed)

## 2019-07-23 NOTE — Telephone Encounter (Signed)
Pt c/o BP issue: STAT if pt c/o blurred vision, one-sided weakness or slurred speech  1. What are your last 5 BP readings? Hovering 366-294 systolic, 76-54 dystolic, not documented specific days  2. Are you having any other symptoms (ex. Dizziness, headache, blurred vision, passed out)?  No symptoms, just face red.   3. What is your BP issue? Elevated,  Today 145/69

## 2019-07-23 NOTE — Telephone Encounter (Signed)
Spoke with patients daughter per release form. She states that patient has increased blood pressures recently ranging from 937-169'C systolic and 78-93'Y diastolic. Reviewed blood pressure monitoring instructions and requested that she keep a log of those findings. Instructed her to please call if they persistently stay greater than 140/80 or higher and that in the meantime I would have scheduling call to get something set up for her to come in. She was agreeable with this plan, had no further questions, and understanding parameters to call back. Will send over to scheduling for next available with APP or provider.

## 2019-08-08 ENCOUNTER — Other Ambulatory Visit: Payer: Self-pay

## 2019-08-08 ENCOUNTER — Ambulatory Visit (INDEPENDENT_AMBULATORY_CARE_PROVIDER_SITE_OTHER): Payer: Medicare PPO | Admitting: Nurse Practitioner

## 2019-08-08 ENCOUNTER — Encounter: Payer: Self-pay | Admitting: Nurse Practitioner

## 2019-08-08 VITALS — BP 140/62 | HR 87 | Temp 97.4°F | Ht 63.0 in | Wt 173.0 lb

## 2019-08-08 DIAGNOSIS — I480 Paroxysmal atrial fibrillation: Secondary | ICD-10-CM

## 2019-08-08 DIAGNOSIS — I10 Essential (primary) hypertension: Secondary | ICD-10-CM

## 2019-08-08 DIAGNOSIS — E782 Mixed hyperlipidemia: Secondary | ICD-10-CM | POA: Diagnosis not present

## 2019-08-08 MED ORDER — METOPROLOL SUCCINATE ER 25 MG PO TB24: 50.0000 mg | ORAL_TABLET | Freq: Every day | ORAL | 6 refills | Status: DC

## 2019-08-08 MED ORDER — METOPROLOL SUCCINATE ER 25 MG PO TB24: 75.0000 mg | ORAL_TABLET | Freq: Every day | ORAL | 6 refills | Status: DC

## 2019-08-08 NOTE — Patient Instructions (Addendum)
Medication Instructions:  1- INCREASE Metoprolol Take 2 tablets (50 mg total) by mouth daily. If you need a refill on your cardiac medications before your next appointment, please call your pharmacy.   Lab work: None ordered  If you have labs (blood work) drawn today and your tests are completely normal, you will receive your results only by: Marland Kitchen MyChart Message (if you have MyChart) OR . A paper copy in the mail If you have any lab test that is abnormal or we need to change your treatment, we will call you to review the results.  Testing/Procedures: None ordered   Follow-Up: At [facility], you and your health needs are our priority.  As part of our continuing mission to provide you with exceptional heart care, we have created designated Provider Care Teams.  These Care Teams include your primary Cardiologist (physician) and Advanced Practice Providers (APPs -  Physician Assistants and Nurse Practitioners) who all work together to provide you with the care you need, when you need it. You will need a follow up appointment in 3 months.  You may see Dr. Rockey Situ or Murray Hodgkins, NP.

## 2019-08-08 NOTE — Progress Notes (Signed)
Office Visit    Patient Name: [name] Date of Encounter: 08/08/2019  Primary Care Provider:  Karie SchwalbeLetvak, Richard I, MD Primary Cardiologist:  Julien Nordmannimothy Gollan, MD  Chief Complaint    85 year old female with a history of paroxysmal atrial fibrillation previously diagnosed in late 1990s, diastolic dysfunction, hypertension, hyperlipidemia, hypothyroidism, and GERD, who presents for follow-up due to recent note of elevated blood pressures.  Past Medical History    Past Medical History:  Diagnosis Date   Allergy    Asthma    Diabetes mellitus    Diastolic dysfunction    a. 06/2019 Echo: EF 60-65%, no rwma, GR1 DD. Mild MR. Nl RV fxn.   Essential hypertension    GERD (gastroesophageal reflux disease)    Hyperlipidemia    Mixed hyperlipidemia    Osteopenia    PAF (paroxysmal atrial fibrillation) (HCC)    a. Dx in late 1990's - no known recurrence.   Sleep disorder    Thyroid disease    Past Surgical History:  Procedure Laterality Date   CERVICAL CONE BIOPSY  1960's   CHOLECYSTECTOMY     NASAL POLYP SURGERY  01/01   STRABISMUS SURGERY  1946 / 1967   VAGINAL DELIVERY     x2    Allergies  Allergies  Allergen Reactions   Cefdinir     REACTION: unspecified   Ciprofloxacin     rash   Clarithromycin     REACTION: weird dreams (on prednisone at the same time but has tolerated this in the past)   Miglitol     REACTION: unspecified   Niacin     REACTION: unspecified   Qvar [Beclomethasone] Other (See Comments)    Feels funny Feels funny    History of Present Illness    85 year old female with a history of paroxysmal atrial fibrillation previously diagnosed in the late 1990s, diastolic dysfunction, hypertension, hyperlipidemia, hypothyroidism, and GERD.  She more recently wore a monitor in September 2017 which did not show any atrial fibrillation.  She is not on oral anticoagulation given presumed isolated episode in the late 90s.  She does  occasionally note palpitations but this occurs maybe once every 4 months or so for just a few seconds.  She was last seen in December 2019, at which time she had been experiencing dizzy spells and was diagnosed with cerebrovascular disease.  She mostly has done well since then though has had a fair amount of life stress in the setting of her husband recently having a nephrectomy in the setting of renal cell carcinoma.  In the setting of COVID, she has been less active and she and her daughter report that they have been eating more processed meals.  Recently, she has been noticing higher blood pressures, sometimes trending into the 170s and 180s.  This prompted a call to our office and an appointment was made.  She has not been having any chest pain, dyspnea, PND, orthopnea, syncope, edema, or early satiety.  Blood pressure today is 140/62.  She is currently only on Toprol-XL 25 mg daily.  Home Medications    Prior to Admission medications   Medication Sig Start Date End Date Taking? Authorizing Provider  ACCU-CHEK SMARTVIEW test strip TEST BLOOD SUGAR BID UTD 03/18/15  Yes [provider]  albuterol (PROVENTIL HFA;VENTOLIN HFA) 108 (90 Base) MCG/ACT inhaler Inhale 2 puffs into the lungs every 6 (six) hours as needed. 07/27/18  Yes Karie SchwalbeLetvak, Richard I, MD  ALPRAZolam Prudy Feeler(XANAX) 0.25 MG tablet TAKE  1 TABLET(0.25 MG) BY MOUTH TWICE DAILY AS NEEDED FOR ANXIETY 06/17/19  Yes [name], MD  aspirin 81 MG tablet Take 81 mg by mouth daily.     Yes [provider]  atorvastatin (LIPITOR) 20 MG tablet TAKE 1 TABLET BY MOUTH EVERY DAY 05/22/19  Yes [name], MD  B-D UF III MINI PEN NEEDLES 31G X 5 MM MISC USE TO INJECT INSULIN FOUR TIMES DAILY AS DIRECTED 09/03/18  Yes [name], MD  B-D ULTRA-FINE 33 LANCETS MISC Use to test blood sugar twice daily dx: E11.9 11/17/14  Yes [name], MD  calcitonin, salmon, (MIACALCIN/FORTICAL) 200 UNIT/ACT nasal spray USE 1 SPRAY IN THE NOSE  EVERY DAY(ALTERNATING NOSTRILS) 06/29/18  Yes [name], MD  CALCIUM-VITAMIN D PO Take 1 tablet by mouth daily.     Yes [provider]  cetirizine (ZYRTEC) 1 MG/ML syrup Take 10 mg by mouth daily.   Yes [provider]  Chromium 100 MCG TABS Take by mouth daily.     Yes [provider]  Coenzyme Q10 200 MG capsule Take 200 mg by mouth daily.     Yes [name], MD  fish oil-omega-3 fatty acids 1000 MG capsule Take 1 g by mouth daily.     Yes [provider]  fluticasone (FLONASE) 50 MCG/ACT nasal spray Place 2 sprays into both nostrils daily.   Yes [provider]  folic acid (FOLVITE) 1 MG tablet Take 1 mg by mouth daily.     Yes [provider]  gabapentin (NEURONTIN) 100 MG capsule Take 1 capsule (100 mg total) by mouth 3 (three) times daily. 12/18/18  Yes [name], MD  gentamicin ointment (GARAMYCIN) 0.1 % Apply 1 application topically 3 (three) times daily.   Yes [provider]  hydrocortisone 2.5 % cream Apply topically 3 (three) times daily as needed. 12/10/18  Yes [name], MD  Insulin Glargine (LANTUS SOLOSTAR) 100 UNIT/ML Solostar Pen INJECT 22 UNITS UNDER THE SKIN EVERY MORNING. 04/30/15  Yes [provider]  levothyroxine (SYNTHROID, LEVOTHROID) 100 MCG tablet Take 100 mcg by mouth daily before breakfast.   Yes [provider]  meclizine (ANTIVERT) 25 MG tablet TAKE 1 TABLET BY MOUTH THREE TIMES DAILY AS NEEDED 01/25/17  Yes Tillman Abide I, MD  metFORMIN (GLUCOPHAGE) 1000 MG tablet TAKING 2 IN THE MORNING AND 2 IN EVENING   Yes [provider]  metoprolol succinate (TOPROL-XL) 25 MG 24 hr tablet TAKE 1 TABLET(25 MG) BY MOUTH DAILY 05/02/19  Yes Tillman Abide I, MD  montelukast (SINGULAIR) 10 MG tablet TAKE 1 TABLET BY MOUTH EVERY NIGHT AT BEDTIME 02/20/19  Yes [name], MD  Multiple Vitamin (MULTIVITAMIN) tablet Take 1 tablet by mouth daily.   Yes [provider]  omeprazole (PRILOSEC) 20 MG capsule TAKE 1 CAPSULE BY MOUTH TWICE DAILY BEFORE A MEAL 07/12/19  Yes [name], MD  vitamin B-12 (CYANOCOBALAMIN) 1000 MCG tablet Take 1,000 mcg by mouth daily.   Yes [provider]  vitamin C (ASCORBIC ACID) 500 MG tablet Take 500 mg by mouth daily.   Yes [provider]    Review of Systems    Elevated blood pressures as outlined above.  She denies chest pain, palpitations, dyspnea, pnd, orthopnea, n, v, dizziness, syncope, edema, weight gain, or early satiety.  All other systems reviewed and are otherwise negative except as noted above.  Physical Exam  VS:  BP 140/62 (BP Location: Left Arm, Patient Position: Sitting, Cuff Size: Normal)    Pulse 87    Temp (!) 97.4 F (36.3 C)    Ht 5\' 3"  (1.6 m)    Wt 173 lb (78.5 kg)    BMI 30.65 kg/m  , BMI Body mass index is 30.65 kg/m. GEN: Well nourished, well developed, in no acute distress. HEENT: normal. Neck: Supple, no JVD, carotid bruits, or masses. Cardiac: RRR, no murmurs, rubs, or gallops. No clubbing, cyanosis, edema.  Radials/PT 2+ and equal bilaterally.  Respiratory:  Respirations regular and unlabored, clear to auscultation bilaterally. GI: Soft, nontender, nondistended, BS + x 4. MS: no deformity or atrophy. Skin: warm and dry, no rash. Neuro:  Strength and sensation are intact. Psych: Normal affect.  Accessory Clinical Findings    ECG personally reviewed by me today -regular sinus rhythm, 87, incomplete right bundle branch block- no acute changes.  Lab Results  Component Value Date   WBC 7.4 01/15/2019   HGB 13.0 01/15/2019   HCT 38.7 01/15/2019   MCV 86.6 01/15/2019   PLT 243 01/15/2019   Lab Results  Component Value Date   CREATININE 0.71 01/15/2019   BUN 16 01/15/2019   NA 136 01/15/2019   K 4.4 01/15/2019   CL 106 01/15/2019   CO2 20 (L) 01/15/2019   Lab Results  Component Value Date   ALT 27 06/23/2016   AST 21 06/23/2016    ALKPHOS 90 06/23/2016   BILITOT 0.5 06/23/2016   Lab Results  Component Value Date   CHOL 91 02/05/2018   HDL 48.90 02/05/2018   LDLCALC 25 02/05/2018   LDLDIRECT 55.5 03/19/2012   TRIG 83.0 02/05/2018   CHOLHDL 2 02/05/2018     Assessment & Plan    1.  Essential hypertension: Blood pressures have been higher recently.  We discussed possible contributing factors including more salt intake, less activity, and increased life stress in the setting of her husband's illness.  Blood pressure today is 140/62.  She is currently on Toprol-XL 25 mg daily.  Heart rate was 87 today.  I am going to increase her Toprol XL to 50 mg daily.  She will continue to follow blood pressure once a day at home.  2.  Paroxysmal atrial fibrillation: Appears this was diagnosed in the late 90s.  She is unaware of any recurrence.  Monitoring in 2017 did not show any evidence of A. fib.  She does have brief palpitations occurring about once every 4 months.  We discussed the possible role of monitoring but given infrequency, this would likely be low yield.  She is currently only on aspirin (CHA2DS2VASc equals 6).  Would have a very low threshold to add oral anticoagulation if she were known to have recurrence of A. fib.  If she has any increase in palpitations, we will place a monitor.  3.  Hyperlipidemia: LDL of 25 in March 2019.  She is on atorvastatin therapy.  4.  Type 2 diabetes mellitus: She is followed by primary care and takes Lantus and metformin.  5.  Disposition: Continue to follow blood pressure at home she will call us if necessary.  Otherwise follow-up in 3 months or sooner.  Murray Hodgkins, NP 08/08/2019, 6:08 PM

## 2019-08-15 DIAGNOSIS — E1165 Type 2 diabetes mellitus with hyperglycemia: Secondary | ICD-10-CM | POA: Diagnosis not present

## 2019-08-15 DIAGNOSIS — Z794 Long term (current) use of insulin: Secondary | ICD-10-CM | POA: Diagnosis not present

## 2019-08-15 DIAGNOSIS — E063 Autoimmune thyroiditis: Secondary | ICD-10-CM | POA: Diagnosis not present

## 2019-08-15 LAB — HEMOGLOBIN A1C: Hemoglobin A1C: 7.9

## 2019-08-21 DIAGNOSIS — E785 Hyperlipidemia, unspecified: Secondary | ICD-10-CM | POA: Diagnosis not present

## 2019-08-21 DIAGNOSIS — E1169 Type 2 diabetes mellitus with other specified complication: Secondary | ICD-10-CM | POA: Diagnosis not present

## 2019-08-21 DIAGNOSIS — E1159 Type 2 diabetes mellitus with other circulatory complications: Secondary | ICD-10-CM | POA: Diagnosis not present

## 2019-08-21 DIAGNOSIS — E063 Autoimmune thyroiditis: Secondary | ICD-10-CM | POA: Diagnosis not present

## 2019-08-21 DIAGNOSIS — E669 Obesity, unspecified: Secondary | ICD-10-CM | POA: Diagnosis not present

## 2019-08-21 DIAGNOSIS — I10 Essential (primary) hypertension: Secondary | ICD-10-CM | POA: Diagnosis not present

## 2019-08-21 DIAGNOSIS — Z794 Long term (current) use of insulin: Secondary | ICD-10-CM | POA: Diagnosis not present

## 2019-08-21 DIAGNOSIS — E1165 Type 2 diabetes mellitus with hyperglycemia: Secondary | ICD-10-CM | POA: Diagnosis not present

## 2019-08-22 ENCOUNTER — Other Ambulatory Visit: Payer: Self-pay

## 2019-08-22 ENCOUNTER — Ambulatory Visit (INDEPENDENT_AMBULATORY_CARE_PROVIDER_SITE_OTHER): Payer: Medicare PPO | Admitting: Internal Medicine

## 2019-08-22 ENCOUNTER — Encounter: Payer: Self-pay | Admitting: Internal Medicine

## 2019-08-22 VITALS — BP 118/80 | HR 65 | Temp 98.3°F | Ht 62.5 in | Wt 173.0 lb

## 2019-08-22 DIAGNOSIS — J4520 Mild intermittent asthma, uncomplicated: Secondary | ICD-10-CM | POA: Diagnosis not present

## 2019-08-22 DIAGNOSIS — I700 Atherosclerosis of aorta: Secondary | ICD-10-CM

## 2019-08-22 DIAGNOSIS — Z23 Encounter for immunization: Secondary | ICD-10-CM | POA: Diagnosis not present

## 2019-08-22 DIAGNOSIS — E113293 Type 2 diabetes mellitus with mild nonproliferative diabetic retinopathy without macular edema, bilateral: Secondary | ICD-10-CM | POA: Diagnosis not present

## 2019-08-22 DIAGNOSIS — E083293 Diabetes mellitus due to underlying condition with mild nonproliferative diabetic retinopathy without macular edema, bilateral: Secondary | ICD-10-CM | POA: Diagnosis not present

## 2019-08-22 DIAGNOSIS — Z7189 Other specified counseling: Secondary | ICD-10-CM

## 2019-08-22 DIAGNOSIS — Z0000 Encounter for general adult medical examination without abnormal findings: Secondary | ICD-10-CM | POA: Diagnosis not present

## 2019-08-22 DIAGNOSIS — I10 Essential (primary) hypertension: Secondary | ICD-10-CM

## 2019-08-22 DIAGNOSIS — F39 Unspecified mood [affective] disorder: Secondary | ICD-10-CM

## 2019-08-22 DIAGNOSIS — Z794 Long term (current) use of insulin: Secondary | ICD-10-CM | POA: Diagnosis not present

## 2019-08-22 LAB — HM DIABETES FOOT EXAM

## 2019-08-22 NOTE — Assessment & Plan Note (Signed)
I have personally reviewed the Medicare Annual Wellness questionnaire and have noted 1. The patient's medical and social history 2. Their use of alcohol, tobacco or illicit drugs 3. Their current medications and supplements 4. The patient's functional ability including ADL's, fall risks, home safety risks and hearing or visual             impairment. 5. Diet and physical activities 6. Evidence for depression or mood disorders  The patients weight, height, BMI and visual acuity have been recorded in the chart I have made referrals, counseling and provided education to the patient based review of the above and I have provided the pt with a written personalized care plan for preventive services.  I have provided you with a copy of your personalized plan for preventive services. Please take the time to review along with your updated medication list.  No cancer screening due to age Flu vaccine today Will look into shingrix in the coming months Discussed exercise

## 2019-08-22 NOTE — Assessment & Plan Note (Signed)
Radiographic diagnosis Is on statin 

## 2019-08-22 NOTE — Assessment & Plan Note (Signed)
BP Readings from Last 3 Encounters:  08/22/19 118/80  08/08/19 140/62  05/28/19 118/70   Better on higher metoprolol

## 2019-08-22 NOTE — Assessment & Plan Note (Signed)
See social history 

## 2019-08-22 NOTE — Assessment & Plan Note (Signed)
Lab Results  Component Value Date   HGBA1C 7.9 08/15/2019   Good control--especially for age Vision stable

## 2019-08-22 NOTE — Assessment & Plan Note (Signed)
Chronic anxiety and now worsening of dysthymia No MDD Has xanax for prn No other meds---counseled on getting up at regular hour, getting dressed, getting out of house, etc

## 2019-08-22 NOTE — Progress Notes (Signed)
Subjective:    Patient ID: [name], female    DOB: [birth year], 85 y.o.   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 She does do laundry and cooking Mild memory issues--some recall issues  Recent endocrine visit A1c 7.9% Doing okay with this Mild diabetic retinopathy  Asthma quiet Hasn't used albuterol in a long time No sig cough or SOB  Daughter notes some mild depression Stuck at home Husband "a lot to handle" per daughter Sleeps a lot Not really anhedonic---enjoys magazines (but often doesn't even get out of her pajamas)  Recent visit at cardiologist BP running up some--for a couple of weeks Seems to have improved now--some med adjustment made (metoprolol increased) Has known aortic atherosclerosis--- is on statin  Current Outpatient Medications on File Prior to Visit  Medication Sig Dispense Refill  . ACCU-CHEK SMARTVIEW test strip TEST BLOOD SUGAR BID UTD  3  . albuterol (PROVENTIL HFA;VENTOLIN HFA) 108 (90 Base) MCG/ACT inhaler Inhale 2 puffs into the lungs every 6 (six) hours as needed. 18 g 1  . ALPRAZolam (XANAX) 0.25 MG tablet TAKE 1 TABLET(0.25 MG) BY MOUTH TWICE DAILY AS NEEDED FOR ANXIETY 60 tablet 0  . aspirin 81 MG tablet Take 81 mg by mouth daily.      Marland Kitchen atorvastatin (LIPITOR) 20 MG tablet TAKE 1 TABLET BY MOUTH EVERY DAY 90 tablet 3  . B-D UF III MINI PEN NEEDLES 31G X 5 MM MISC USE TO INJECT INSULIN FOUR TIMES DAILY AS DIRECTED 200 each 1  . B-D ULTRA-FINE 33 LANCETS MISC Use to test blood sugar twice daily dx: E11.9 200 each 3  . calcitonin, salmon, (MIACALCIN/FORTICAL) 200 UNIT/ACT nasal spray USE 1 SPRAY IN THE NOSE EVERY DAY(ALTERNATING NOSTRILS) 3.7 mL 11  .  CALCIUM-VITAMIN D PO Take 1 tablet by mouth daily.      . cetirizine (ZYRTEC) 1 MG/ML syrup Take 10 mg by mouth daily.    . Chromium 100 MCG TABS Take by mouth daily.      . Coenzyme Q10 200 MG capsule Take 200 mg by mouth daily.      . fish oil-omega-3 fatty acids 1000 MG capsule Take 1 g by mouth daily.      . fluticasone (FLONASE) 50 MCG/ACT nasal spray Place 2 sprays into both nostrils daily.    . folic acid (FOLVITE) 1 MG tablet Take 1 mg by mouth daily.      Marland Kitchen gabapentin (NEURONTIN) 100 MG capsule Take 1 capsule (100 mg total) by mouth 3 (three) times daily. 90 capsule 0  . gentamicin ointment (GARAMYCIN) 0.1 % Apply 1 application topically 3 (three) times daily.    . hydrocortisone 2.5 % cream Apply topically 3 (three) times daily as needed. 28 g 3  . Insulin Glargine (LANTUS SOLOSTAR) 100 UNIT/ML Solostar Pen INJECT 22 UNITS UNDER THE SKIN EVERY MORNING.    Marland Kitchen levothyroxine (SYNTHROID, LEVOTHROID) 100 MCG tablet Take 100 mcg by mouth daily before breakfast.    . meclizine (ANTIVERT) 25 MG tablet TAKE 1 TABLET BY MOUTH THREE TIMES DAILY AS NEEDED 90 tablet 1  . metFORMIN (GLUCOPHAGE) 1000 MG tablet TAKING 2 IN THE MORNING AND 2 IN EVENING    . metoprolol succinate (TOPROL-XL) 25 MG  24 hr tablet Take 2 tablets (50 mg total) by mouth daily. 90 tablet 6  . montelukast (SINGULAIR) 10 MG tablet TAKE 1 TABLET BY MOUTH EVERY NIGHT AT BEDTIME 90 tablet 3  . Multiple Vitamin (MULTIVITAMIN) tablet Take 1 tablet by mouth daily.    Marland Kitchen omeprazole (PRILOSEC) 20 MG capsule TAKE 1 CAPSULE BY MOUTH TWICE DAILY BEFORE A MEAL 180 capsule 3  . vitamin B-12 (CYANOCOBALAMIN) 1000 MCG tablet Take 1,000 mcg by mouth daily.    . vitamin C (ASCORBIC ACID) 500 MG tablet Take 500 mg by mouth daily.     No current facility-administered medications on file prior to visit.     Allergies  Allergen Reactions  . Cefdinir     REACTION: unspecified  . Ciprofloxacin     rash  . Clarithromycin     REACTION: weird  dreams (on prednisone at the same time but has tolerated this in the past)  . Miglitol     REACTION: unspecified  . Niacin     REACTION: unspecified  . Qvar [Beclomethasone] Other (See Comments)    Feels funny Feels funny    Past Medical History:  Diagnosis Date  . Allergy   . Asthma   . Diabetes mellitus   . Diastolic dysfunction    a. 06/2019 Echo: EF 60-65%, no rwma, GR1 DD. Mild MR. Nl RV fxn.  . Essential hypertension   . GERD (gastroesophageal reflux disease)   . Hyperlipidemia   . Mixed hyperlipidemia   . Osteopenia   . PAF (paroxysmal atrial fibrillation) ([place])    a. Dx in late 1990's - no known recurrence.  . Sleep disorder   . Thyroid disease     Past Surgical History:  Procedure Laterality Date  . CERVICAL CONE BIOPSY  1960's  . CHOLECYSTECTOMY    . NASAL POLYP SURGERY  01/01  . STRABISMUS SURGERY  1946 / 82  . VAGINAL DELIVERY     x2    Family History  Problem Relation Age of Onset  . Asthma Mother   . Coronary artery disease Father     Social History   Socioeconomic History  . Marital status: Married    Spouse name: Not on file  . Number of children: 2  . Years of education: Not on file  . Highest education level: Not on file  Occupational History    Employer: RETIRED  Social Needs  . Financial resource strain: Not on file  . Food insecurity    Worry: Not on file    Inability: Not on file  . Transportation needs    Medical: Not on file    Non-medical: Not on file  Tobacco Use  . Smoking status: Never Smoker  . Smokeless tobacco: Never Used  Substance and Sexual Activity  . Alcohol use: Yes    Alcohol/week: 0.0 standard drinks    Comment: 1 glass of wine per day  . Drug use: No  . Sexual activity: Not on file  Lifestyle  . Physical activity    Days per week: Not on file    Minutes per session: Not on file  . Stress: Not on file  Relationships  . Social Herbalist on phone: Not on file    Gets together: Not on file     Attends religious service: Not on file    Active member of club or organization: Not on file    Attends meetings of clubs or organizations: Not on file  Relationship status: Not on file  . Intimate partner violence    Fear of current or ex partner: Not on file    Emotionally abused: Not on file    Physically abused: Not on file    Forced sexual activity: Not on file  Other Topics Concern  . Not on file  Social History Narrative   Has living will   Not sure about formal POA --but requests husband, then daughter [name] (and son Francis DowseJoel) to be health care POAs   Would accept resuscitation attempts   Not sure about feeding tubes    Review of Systems Appetite is fine Weight is up some Sleeps well----uses xanax rarely Uses seat belt Heartburn at times--depending on what and when she eats. No dysphagia Sensitive throat Bowels still loose at times--relates to metformin (back on the regular due to recall) Has rarely used imodium (got her bound up) Notes some urinary leakage No change in chronic aches and pains--back, etc No suspicious lesions    Objective:   Physical Exam  Constitutional: She is oriented to person, place, and time. She appears well-developed. No distress.  HENT:  Mouth/Throat: Oropharynx is clear and moist. No oropharyngeal exudate.  Neck: No thyromegaly present.  Cardiovascular: Normal rate, regular rhythm, normal heart sounds and intact distal pulses. Exam reveals no gallop.  No murmur heard. Respiratory: Effort normal and breath sounds normal. No respiratory distress. She has no wheezes. She has no rales.  GI: Soft. There is no abdominal tenderness.  Musculoskeletal:        General: No tenderness or edema.  Lymphadenopathy:    She has no cervical adenopathy.  Neurological: She is alert and oriented to person, place, and time.  President--- "Garnet Koyanagionald Trump, Obama, Bush" [phone] Recall 3/3  Fairly normal sensation in feet  Skin: No  rash noted. No erythema.  No foot lesions  Psychiatric: She has a normal mood and affect. Her behavior is normal.           Assessment & Plan:

## 2019-08-22 NOTE — Assessment & Plan Note (Signed)
Mild and quiet now

## 2019-08-22 NOTE — Progress Notes (Signed)
Hearing Screening   125Hz 250Hz 500Hz 1000Hz 2000Hz 3000Hz 4000Hz 6000Hz 8000Hz  Right ear:           Left ear:           Comments: Has hearing aids. Not wearing them today.  Vision Screening Comments: August 2020   

## 2019-09-04 ENCOUNTER — Telehealth: Payer: Self-pay | Admitting: Internal Medicine

## 2019-09-04 NOTE — Telephone Encounter (Signed)
That's okay. We did not change it to [facility]. It is still [facility] on her profile.

## 2019-09-04 NOTE — Telephone Encounter (Signed)
Patient called.  When she filled out her medicare wellness form she accidentally put [facility] as her pharmacy. Patient said her pharmacy is [facility] on [address].

## 2019-09-30 ENCOUNTER — Other Ambulatory Visit: Payer: Self-pay

## 2019-09-30 MED ORDER — MECLIZINE HCL 25 MG PO TABS: 25.0000 mg | ORAL_TABLET | Freq: Three times a day (TID) | ORAL | 0 refills | Status: DC | PRN

## 2019-09-30 NOTE — Telephone Encounter (Signed)
Pt requested refill

## 2019-10-01 ENCOUNTER — Telehealth: Payer: Self-pay | Admitting: Internal Medicine

## 2019-10-01 MED ORDER — ALPRAZOLAM 0.25 MG PO TABS: ORAL_TABLET | ORAL | 0 refills | Status: DC

## 2019-10-01 NOTE — Telephone Encounter (Signed)
Name of Medication: alprazolam 0.25 mg Name of Pharmacy: [facility] [address] Last Fill or Written Date and Quantity: # 60 on 06/17/19 Last Office Visit and Type:08/22/19 [place] Next Office Visit and Type: 08/24/2020 Annual exam

## 2019-10-01 NOTE — Telephone Encounter (Signed)
Patient is requesting a refill  XANAX  Patient stated she has been trying to get this refilled since the [place]

## 2019-10-29 ENCOUNTER — Other Ambulatory Visit: Payer: Self-pay | Admitting: Internal Medicine

## 2019-11-22 NOTE — Progress Notes (Signed)
Cardiology Office Note  Date:  11/26/2019   ID:  [name], DOB Mar 20, 1931, MRN [id number]  PCP:  [name], MD   Chief Complaint  Patient presents with  . office visit    6 mo F/U; Meds verbally reviewed with patient.    HPI:  [name] is a 86 y.o. female with a hx of paroxysmal atrial fibrillation. diagnosed 23 years ago, age 31 In hospital ,lasted 1 week,  none since then ("heart was feeling really weird") chadsvasc 4, on asa, does not want NOAC HTN Hyperlipidemia DM II, followed by Dr. Renae Fickle,  Who presents for routine follow-up of her palpitations/atrial fibrillation  Feels well, Denies arrhythmia, No regular exercise program Denies shortness of breath or chest pain  Prior dizzy episodes 2020 CT and MRI head Severe stenosis of right posterior artery Diffuse atherosclerosis.  On aspirin  She was seen in the clinic several months ago, metoprolol increased up to 50 twice daily for hypertension  Lab work reviewed with her HBA1C  8.2 , now 7.9  EKG personally reviewed by myself on todays visit Shows normal sinus rhythm with rate 73 bpm no significant ST or T wave changes  Other past medical history reviewed Echo 06/2016: Normal EF >55%, normal LA size Event monitor 06/2016: NSR, no atrial fib  episode of chest pain, in the ED 07/2016  She did have palpitations September 2017, went to the hospital at that time No arrhythmia noted Follow-up event monitor did not show arrhythmia  CT chest  2015: Atherosclerotic calcification aorta and minimally in coronary arteries.   PMH:   has a past medical history of Allergy, Asthma, Diabetes mellitus, Diastolic dysfunction, Essential hypertension, GERD (gastroesophageal reflux disease), Hyperlipidemia, Mixed hyperlipidemia, Osteopenia, PAF (paroxysmal atrial fibrillation) (HCC), Sleep disorder, and Thyroid disease.  PSH:    Past Surgical History:  Procedure Laterality Date  . CERVICAL CONE BIOPSY  1960's   . CHOLECYSTECTOMY    . NASAL POLYP SURGERY  01/01  . STRABISMUS SURGERY  1946 / 59  . VAGINAL DELIVERY     x2    Current Outpatient Medications  Medication Sig Dispense Refill  . ACCU-CHEK SMARTVIEW test strip TEST BLOOD SUGAR BID UTD  3  . albuterol (PROVENTIL HFA;VENTOLIN HFA) 108 (90 Base) MCG/ACT inhaler Inhale 2 puffs into the lungs every 6 (six) hours as needed. 18 g 1  . ALPRAZolam (XANAX) 0.25 MG tablet TAKE 1 TABLET(0.25 MG) BY MOUTH TWICE DAILY AS NEEDED FOR ANXIETY 60 tablet 0  . aspirin 81 MG tablet Take 81 mg by mouth daily.      Marland Kitchen atorvastatin (LIPITOR) 20 MG tablet TAKE 1 TABLET BY MOUTH EVERY DAY 90 tablet 3  . B-D ULTRA-FINE 33 LANCETS MISC Use to test blood sugar twice daily dx: E11.9 200 each 3  . calcitonin, salmon, (MIACALCIN/FORTICAL) 200 UNIT/ACT nasal spray USE 1 SPRAY IN THE NOSE EVERY DAY(ALTERNATING NOSTRILS) 3.7 mL 11  . CALCIUM-VITAMIN D PO Take 1 tablet by mouth daily.      . cetirizine (ZYRTEC) 1 MG/ML syrup Take 10 mg by mouth daily.    . Chromium 100 MCG TABS Take by mouth daily.      . Coenzyme Q10 200 MG capsule Take 200 mg by mouth daily.      . fish oil-omega-3 fatty acids 1000 MG capsule Take 1 g by mouth daily.      . fluticasone (FLONASE) 50 MCG/ACT nasal spray Place 2 sprays into both nostrils daily.    Marland Kitchen  folic acid (FOLVITE) 1 MG tablet Take 1 mg by mouth daily.      Marland Kitchen gabapentin (NEURONTIN) 100 MG capsule Take 1 capsule (100 mg total) by mouth 3 (three) times daily. 90 capsule 0  . gentamicin ointment (GARAMYCIN) 0.1 % Apply 1 application topically 3 (three) times daily.    . hydrocortisone 2.5 % cream Apply topically 3 (three) times daily as needed. 28 g 3  . Insulin Glargine (LANTUS SOLOSTAR) 100 UNIT/ML Solostar Pen INJECT 22 UNITS UNDER THE SKIN EVERY MORNING.    . Insulin Pen Needle (B-D UF III MINI PEN NEEDLES) 31G X 5 MM MISC Use to inject insulin 1-2 times daily 200 each 1  . levothyroxine (SYNTHROID, LEVOTHROID) 100 MCG tablet Take  100 mcg by mouth daily before breakfast.    . meclizine (ANTIVERT) 25 MG tablet Take 1 tablet (25 mg total) by mouth 3 (three) times daily as needed. 90 tablet 0  . metFORMIN (GLUCOPHAGE) 1000 MG tablet TAKING 2 IN THE MORNING AND 2 IN EVENING    . metoprolol succinate (TOPROL-XL) 25 MG 24 hr tablet Take 2 tablets (50 mg total) by mouth daily. 90 tablet 6  . montelukast (SINGULAIR) 10 MG tablet TAKE 1 TABLET BY MOUTH EVERY NIGHT AT BEDTIME 90 tablet 3  . Multiple Vitamin (MULTIVITAMIN) tablet Take 1 tablet by mouth daily.    Marland Kitchen omeprazole (PRILOSEC) 20 MG capsule TAKE 1 CAPSULE BY MOUTH TWICE DAILY BEFORE A MEAL 180 capsule 3  . vitamin B-12 (CYANOCOBALAMIN) 1000 MCG tablet Take 1,000 mcg by mouth daily.    . vitamin C (ASCORBIC ACID) 500 MG tablet Take 500 mg by mouth daily.     No current facility-administered medications for this visit.     Allergies:   Cefdinir, Ciprofloxacin, Clarithromycin, Miglitol, Niacin, and Qvar [beclomethasone]   Social History:  The patient  reports that she has never smoked. She has never used smokeless tobacco. She reports current alcohol use. She reports that she does not use drugs.   Family History:   family history includes Asthma in her mother; Coronary artery disease in her father.    Review of Systems: Review of Systems  Constitutional: Negative.   Respiratory: Negative.   Cardiovascular: Negative.   Gastrointestinal: Negative.   Musculoskeletal: Negative.   Neurological: Positive for dizziness.  Psychiatric/Behavioral: Negative.   All other systems reviewed and are negative.    PHYSICAL EXAM: VS:  BP 120/80 (BP Location: Left Arm, Patient Position: Sitting, Cuff Size: Normal)   Pulse 73   Ht 5\' 3"  (1.6 m)   Wt 176 lb 8 oz (80.1 kg)   SpO2 95%   BMI 31.27 kg/m  , BMI Body mass index is 31.27 kg/m. Constitutional:  oriented to person, place, and time. No distress.  HENT:  Head: Grossly normal Eyes:  no discharge. No scleral icterus.   Neck: No JVD, no carotid bruits  Cardiovascular: Regular rate and rhythm, no murmurs appreciated Pulmonary/Chest: Clear to auscultation bilaterally, no wheezes or rails Abdominal: Soft.  no distension.  no tenderness.  Musculoskeletal: Normal range of motion Neurological:  normal muscle tone. Coordination normal. No atrophy Skin: Skin warm and dry Psychiatric: normal affect, pleasant   Recent Labs: 01/15/2019: BUN 16; Creatinine, Ser 0.71; Hemoglobin 13.0; Platelets 243; Potassium 4.4; Sodium 136    Lipid Panel Lab Results  Component Value Date   CHOL 91 02/05/2018   HDL 48.90 02/05/2018   LDLCALC 25 02/05/2018   TRIG 83.0 02/05/2018  Wt Readings from Last 3 Encounters:  11/26/19 176 lb 8 oz (80.1 kg)  08/22/19 173 lb (78.5 kg)  08/08/19 173 lb (78.5 kg)     ASSESSMENT AND PLAN:  Paroxysmal atrial fibrillation (HCC) episode >20 years ago Not much documentation of atrial fib since then Discussed various types of home monitoring systems including [employer].  Patient lives with daughter, she will look into a monitoring system.  Husband has atrial fibrillation on anticoagulation so she is aware.  She is comfortable staying on aspirin at this time  Essential hypertension Blood pressure is well controlled on today's visit. No changes made to the medications.  Mixed hyperlipidemia Cholesterol at goal, continue current medication  Cerebrovascular disease On aspirin, cholesterol at goal  Aortic atherosclerosis (HCC) Seen on CT scan Stressed importance of aggressive diabetes control No further testing   Total encounter time more than 25 minutes  Greater than 50% was spent in counseling and coordination of care with the patient   Disposition:   F/U  12 months   Orders Placed This Encounter  Procedures  . EKG 12-Lead     Signed, Esmond Plants, M.D., Ph.D. 11/26/2019  [place], [place]

## 2019-11-25 ENCOUNTER — Telehealth: Payer: Self-pay | Admitting: Lab

## 2019-11-25 ENCOUNTER — Telehealth: Payer: Self-pay | Admitting: Internal Medicine

## 2019-11-25 NOTE — Telephone Encounter (Signed)
Patient's daughter called back Advised of message from Dr Alphonsus Sias

## 2019-11-25 NOTE — Telephone Encounter (Signed)
Tell them to go and get the vaccine ASAP. I totally recommend it and had my first one already.

## 2019-11-25 NOTE — Telephone Encounter (Signed)
Patient's husband called today in regards to the covid vaccine    He was given the information on scheduling an appointment and location  But he wanted to know if you think his wife and him should get these. He stated the patient is very skittish and wanted to see if this is a good idea    Patient's husband is requesting a call back

## 2019-11-25 NOTE — Telephone Encounter (Signed)
Called Pt No answer, left a VM to call office when she receives this message.

## 2019-11-25 NOTE — Telephone Encounter (Signed)
Called Pt No answer, left a VM to call office when she receives this message. 

## 2019-11-26 ENCOUNTER — Encounter: Payer: Self-pay | Admitting: Cardiovascular Disease

## 2019-11-26 ENCOUNTER — Ambulatory Visit (INDEPENDENT_AMBULATORY_CARE_PROVIDER_SITE_OTHER): Payer: Medicare PPO | Admitting: Cardiovascular Disease

## 2019-11-26 ENCOUNTER — Other Ambulatory Visit: Payer: Self-pay

## 2019-11-26 VITALS — BP 120/80 | HR 73 | Ht 63.0 in | Wt 176.5 lb

## 2019-11-26 DIAGNOSIS — E782 Mixed hyperlipidemia: Secondary | ICD-10-CM | POA: Diagnosis not present

## 2019-11-26 DIAGNOSIS — I480 Paroxysmal atrial fibrillation: Secondary | ICD-10-CM | POA: Diagnosis not present

## 2019-11-26 DIAGNOSIS — I10 Essential (primary) hypertension: Secondary | ICD-10-CM | POA: Diagnosis not present

## 2019-11-26 DIAGNOSIS — I700 Atherosclerosis of aorta: Secondary | ICD-10-CM | POA: Diagnosis not present

## 2019-11-26 MED ORDER — METOPROLOL SUCCINATE ER 50 MG PO TB24: 50.0000 mg | ORAL_TABLET | Freq: Every day | ORAL | 3 refills | Status: DC

## 2019-11-26 NOTE — Patient Instructions (Signed)

## 2019-11-28 DIAGNOSIS — I10 Essential (primary) hypertension: Secondary | ICD-10-CM | POA: Diagnosis not present

## 2019-11-28 DIAGNOSIS — E1159 Type 2 diabetes mellitus with other circulatory complications: Secondary | ICD-10-CM | POA: Diagnosis not present

## 2019-11-28 DIAGNOSIS — Z794 Long term (current) use of insulin: Secondary | ICD-10-CM | POA: Diagnosis not present

## 2019-11-28 DIAGNOSIS — E669 Obesity, unspecified: Secondary | ICD-10-CM | POA: Diagnosis not present

## 2019-11-28 DIAGNOSIS — E063 Autoimmune thyroiditis: Secondary | ICD-10-CM | POA: Diagnosis not present

## 2019-11-28 DIAGNOSIS — E1169 Type 2 diabetes mellitus with other specified complication: Secondary | ICD-10-CM | POA: Diagnosis not present

## 2019-11-28 DIAGNOSIS — E785 Hyperlipidemia, unspecified: Secondary | ICD-10-CM | POA: Diagnosis not present

## 2019-11-28 DIAGNOSIS — E1165 Type 2 diabetes mellitus with hyperglycemia: Secondary | ICD-10-CM | POA: Diagnosis not present

## 2019-12-09 DIAGNOSIS — H6123 Impacted cerumen, bilateral: Secondary | ICD-10-CM | POA: Diagnosis not present

## 2019-12-09 DIAGNOSIS — H60543 Acute eczematoid otitis externa, bilateral: Secondary | ICD-10-CM | POA: Diagnosis not present

## 2020-01-13 ENCOUNTER — Other Ambulatory Visit: Payer: Self-pay

## 2020-01-13 MED ORDER — CALCITONIN (SALMON) 200 UNIT/ACT NA SOLN: NASAL | 11 refills | Status: DC

## 2020-01-13 NOTE — Telephone Encounter (Signed)
Rx sent electronically.  

## 2020-02-03 ENCOUNTER — Other Ambulatory Visit: Payer: Self-pay | Admitting: Internal Medicine

## 2020-02-04 NOTE — Telephone Encounter (Signed)
Last filled 10-01-19 #60 Last OV 08-22-19 Next OV 08-24-20 [facility] [address]

## 2020-02-17 ENCOUNTER — Telehealth: Payer: Self-pay | Admitting: Internal Medicine

## 2020-02-17 ENCOUNTER — Ambulatory Visit: Payer: Medicare PPO | Admitting: Family Medicine

## 2020-02-17 NOTE — Telephone Encounter (Signed)
Pt fell down steps at home on Saturday and hit her head, arm and hurt her foot. EMS was called and the patient was evaluated - EMS stated that they did not feel she needed to go to the ED but she did need to follow up with her PCP this week.  Pt is fine cognitively - does not seem to be any issues with her hitting her head. Arm seems fine.  Left foot is very bruised and black/blue - pt cannot bear a ton of weight on her foot and is limping. They want to make sure nothing is broken.   Dr Alphonsus Sias has no openings until later this week - Thursday/Friday.   Scheduled with Dr Selena Batten this afternoon at 420 -- they requested the latest appt with any Provider.  Will send to Dr Selena Batten as Lorain Childes as well as Dr Alphonsus Sias.

## 2020-02-17 NOTE — Telephone Encounter (Signed)
Okay As long as she gets seen

## 2020-02-18 ENCOUNTER — Encounter: Payer: Self-pay | Admitting: Family Medicine

## 2020-02-18 ENCOUNTER — Other Ambulatory Visit: Payer: Self-pay

## 2020-02-18 ENCOUNTER — Ambulatory Visit: Payer: Medicare PPO | Admitting: Family Medicine

## 2020-02-18 ENCOUNTER — Ambulatory Visit (INDEPENDENT_AMBULATORY_CARE_PROVIDER_SITE_OTHER)
Admission: RE | Admit: 2020-02-18 | Discharge: 2020-02-18 | Disposition: A | Discharge status: Home / Self Care | Payer: Medicare PPO | Source: Ambulatory Visit | Attending: Family Medicine | Admitting: Family Medicine

## 2020-02-18 VITALS — BP 130/64 | HR 84 | Temp 98.2°F | Resp 20 | Ht 62.5 in | Wt 177.5 lb

## 2020-02-18 DIAGNOSIS — M79672 Pain in left foot: Secondary | ICD-10-CM

## 2020-02-18 DIAGNOSIS — M25572 Pain in left ankle and joints of left foot: Secondary | ICD-10-CM | POA: Diagnosis not present

## 2020-02-18 NOTE — Progress Notes (Signed)
   Subjective:     [name] is a 86 y.o. female presenting for Fall (slipped and fell at home on 02/15/20. Left foot is bruised and swollen. Hit her head and her right elbow)     HPI  #Foot pain - fall on 4/3 - difficulty with fulling dorsiflexing the foot - has been limping - need a walker due to the foot pain - pain is in the forefoot on the lateral side - was walking downstairs - barefoot and food slipped off the step and she landed on the lateral edge of the foot - fell on her bottom and right side and hit her head on the side  Review of Systems   Social History   Tobacco Use  Smoking Status Never Smoker  Smokeless Tobacco Never Used        Objective:    BP Readings from Last 3 Encounters:  02/18/20 130/64  11/26/19 120/80  08/22/19 118/80   Wt Readings from Last 3 Encounters:  02/18/20 177 lb 8 oz (80.5 kg)  11/26/19 176 lb 8 oz (80.1 kg)  08/22/19 173 lb (78.5 kg)    BP 130/64   Pulse 84   Temp 98.2 F (36.8 C)   Resp 20   Ht 5' 2.5" (1.588 m)   Wt 177 lb 8 oz (80.5 kg)   SpO2 94%   BMI 31.95 kg/m    Physical Exam Constitutional:      General: She is not in acute distress.    Appearance: She is well-developed. She is not diaphoretic.  HENT:     Right Ear: External ear normal.     Left Ear: External ear normal.     Nose: Nose normal.  Eyes:     Conjunctiva/sclera: Conjunctivae normal.  Cardiovascular:     Rate and Rhythm: Normal rate.  Pulmonary:     Effort: Pulmonary effort is normal.  Musculoskeletal:     Cervical back: Neck supple.     Comments: Left foot Inspection: swelling and bruising Palpation: TTP along the lateral malleolus, base of the fifth metatarsal, as well as long the lower leg and metatarsal area.  ROM: normal Strength: normal  Skin:    General: Skin is warm and dry.     Capillary Refill: Capillary refill takes less than 2 seconds.  Neurological:     Mental Status: She is alert. Mental status is at baseline.    Psychiatric:        Mood and Affect: Mood normal.        Behavior: Behavior normal.      XR: no acute fracture on my read     Assessment & Plan:   Problem List Items Addressed This Visit      Other   Left foot pain - Primary    From fall/trauma. Suspect sprain. No fracture on XR will f/u final read. RICE treatment. Weight bearing as tolerated. Return if not improving in 4-6 weeks      Relevant Orders   DG Ankle Complete Left   DG Foot Complete Left       Return if symptoms worsen or fail to improve.  Lynnda Child, MD

## 2020-02-18 NOTE — Assessment & Plan Note (Signed)
From fall/trauma. Suspect sprain. No fracture on XR will f/u final read. RICE treatment. Weight bearing as tolerated. Return if not improving in 4-6 weeks

## 2020-02-18 NOTE — Patient Instructions (Signed)
No sign of fracture, but the final evaluation will be back tomorrow.   Likely an ankle sprain.   I would recommend 1) Ice 2) Elevation 3) Pain medication as needed 4) Walking/weight bearing as tolerated 5) Ankle braces - available at a pharmacy can be helpful for some support

## 2020-02-18 NOTE — Telephone Encounter (Signed)
See encounter from today.

## 2020-02-19 ENCOUNTER — Telehealth: Payer: Self-pay | Admitting: Family Medicine

## 2020-02-19 NOTE — Telephone Encounter (Signed)
Discussed that the XR showed a small possible fracture.   Reviewed film with Dr. Patsy Lager. And that given age it may be more difficulty to wear a boot or cast and increase fall risk. Fracture should heal slowly and would recommend hard sole shoe like a hiking boot.   Daughter took message and expressed understanding. She will discuss with her mom.   Advised follow-up with Dr. Patsy Lager in 2 weeks to recheck. If she desires boot or immobilization she can schedule to see Dr. Patsy Lager tomorrow or Friday.   FYI to MA and Dr. Patsy Lager as daughter was going to discuss with mom and call back with decision

## 2020-02-19 NOTE — Telephone Encounter (Signed)
Per epic appointment is scheduled for 02/20/20 to see Dr Patsy Lager

## 2020-02-20 ENCOUNTER — Ambulatory Visit: Payer: Medicare PPO | Admitting: Family Medicine

## 2020-02-20 ENCOUNTER — Other Ambulatory Visit: Payer: Self-pay

## 2020-02-20 ENCOUNTER — Encounter: Payer: Self-pay | Admitting: Family Medicine

## 2020-02-20 VITALS — BP 120/60 | HR 67 | Temp 98.4°F | Ht 62.5 in

## 2020-02-20 DIAGNOSIS — S93492A Sprain of other ligament of left ankle, initial encounter: Secondary | ICD-10-CM

## 2020-02-20 DIAGNOSIS — S92214A Nondisplaced fracture of cuboid bone of right foot, initial encounter for closed fracture: Secondary | ICD-10-CM

## 2020-02-20 DIAGNOSIS — S7011XA Contusion of right thigh, initial encounter: Secondary | ICD-10-CM | POA: Diagnosis not present

## 2020-02-20 NOTE — Progress Notes (Signed)
[name] T. Keahi Mccarney, MD, CAQ Sports Medicine  Primary Care and Sports Medicine [facility] at [facility] [address] [place], [address]  Phone: [phone]  FAX: [phone]  [name] - 86 y.o. female  MRN [id number]  Date of Birth: 06-Mar-1931  Date: 02/20/2020  PCP: [name], MD  Referral: [name], MD  Chief Complaint  Patient presents with  . Follow-up    Left Foot Fx    This visit occurred during the SARS-CoV-2 public health emergency.  Safety protocols were in place, including screening questions prior to the visit, additional usage of staff PPE, and extensive cleaning of exam room while observing appropriate contact time as indicated for disinfecting solutions.   Subjective:   [name] is a 86 y.o. very pleasant female patient with Body mass index is 31.95 kg/m. who presents with the following:  She is a lady who fell and had a had an inversion injury on her left foot and ankle.  She had some immediate pain and swelling, and she also has some diffuse swelling and bruising now from the toes all the way up to her lower extremity.  Her primary pain is lateral.  Prior to this she had full functionality and required no assistive devices for motion.  At this point she is using a walker to assist.  She saw my partner Dr. Selena Batten on February 18, 2020.  And she asked for my opinion and review of the patient's radiographs in the office.  She also struck her right thigh and has some pain in this region.  There is also bruising here as well  Small cuboid av lateral hip impact on the right.  She also had an inversion injury on the left ankle.ulsion  R thigh hematoma  Review of Systems is noted in the HPI, as appropriate   Objective:   BP 120/60   Pulse 67   Temp 98.4 F (36.9 C) (Temporal)   Ht 5' 2.5" (1.588 m)   SpO2 95%   BMI 31.95 kg/m   GEN: No acute distress; alert,appropriate. PULM: Breathing comfortably in  no respiratory distress PSYCH: Normally interactive.  She is walking, but painfully and with a limp.  Left ankle: Diffuse bruising and some swelling throughout most of the ankle and foot.  She is nontender throughout the tibia and fibula.  The forefoot is nontender.  The midfoot is essentially nontender, she does have some mild pain around the cuboid, but navicular, cuneiforms are all nontender.  The talus is nontender.  Fifth metatarsal is nontender.  Calcaneal fibular ligament is nontender.  Deltoid ligament is nontender. Very painful at the ATFL.  Given pain level no significant manipulation.  She does have some mild pain at the cuboid.  On examination of the patient's thigh she does have an elevated painful area that is clearly hematoma and black and blue this is approximately 6 inches across.  Radiology: DG Ankle Complete Left  Result Date: 02/19/2020 CLINICAL DATA:  Lateral foot and ankle pain EXAM: LEFT ANKLE COMPLETE - 3+ VIEW; LEFT FOOT - COMPLETE 3+ VIEW COMPARISON:  None. FINDINGS: Small osseous fragment adjacent to the anterolateral aspect of the calcaneus adjacent to the calcaneocuboid joint which may reflect a small avulsion fracture fragment. Osseous structures appear otherwise intact. Ankle mortise is congruent without dislocation. Small bilateral calcaneal enthesophytes. Small os peroneum. Mild first MTP joint space narrowing. Soft tissue swelling over the lateral ankle and hindfoot. IMPRESSION: Small osseous  fragment adjacent to the anterolateral aspect of the calcaneus adjacent to the calcaneocuboid joint which may reflect a small avulsion fracture fragment. Correlation with point tenderness at this site is recommended. Electronically Signed   By: Davina Poke D.O.   On: 02/19/2020 08:53   DG Foot Complete Left  Result Date: 02/19/2020 CLINICAL DATA:  Lateral foot and ankle pain EXAM: LEFT ANKLE COMPLETE - 3+ VIEW; LEFT FOOT - COMPLETE 3+ VIEW COMPARISON:  None. FINDINGS:  Small osseous fragment adjacent to the anterolateral aspect of the calcaneus adjacent to the calcaneocuboid joint which may reflect a small avulsion fracture fragment. Osseous structures appear otherwise intact. Ankle mortise is congruent without dislocation. Small bilateral calcaneal enthesophytes. Small os peroneum. Mild first MTP joint space narrowing. Soft tissue swelling over the lateral ankle and hindfoot. IMPRESSION: Small osseous fragment adjacent to the anterolateral aspect of the calcaneus adjacent to the calcaneocuboid joint which may reflect a small avulsion fracture fragment. Correlation with point tenderness at this site is recommended. Electronically Signed   By: Davina Poke D.O.   On: 02/19/2020 08:53    Assessment and Plan:     ICD-10-CM   1. Sprain of anterior talofibular ligament of left ankle, initial encounter  S93.492A   2. Closed nondisplaced fracture of cuboid of right foot, initial encounter  S92.214A   3. Hematoma of right thigh, initial encounter  S70.11XA    Total encounter time: 30 minutes. On the day of the patient encounter, this can include review of prior records, labs, and imaging.  Additional time can include counselling, consultation with peer MD in person or by telephone.  This also includes independent review of Radiology.  Primary pain driver here is her severe ATFL sprain.  She does have a small cuboid avulsion, but I do not think that this will inhibit her healing.  She understands that her healing may delayed given her age.  I placed the patient in an Aircast, and she is to use her walker at all times while up and walking. Patient placed in a pneumatic compression-type splint, which has been shown to decrease overall time of rehab, swelling, and faster return to full function.   No concerns regarding the hematoma, reassurance, this will resolve on its own.  I appreciate the opportunity to evaluate this very friendly patient. If you have any question  regarding her care or prognosis, do not hesitate to ask.   Follow-up: Return in about 4 weeks (around 03/19/2020).  No orders of the defined types were placed in this encounter.  There are no discontinued medications. No orders of the defined types were placed in this encounter.   Signed,  Maud Deed. Darria Corvera, MD   Outpatient Encounter Medications as of 02/20/2020  Medication Sig  . ACCU-CHEK SMARTVIEW test strip TEST BLOOD SUGAR BID UTD  . albuterol (PROVENTIL HFA;VENTOLIN HFA) 108 (90 Base) MCG/ACT inhaler Inhale 2 puffs into the lungs every 6 (six) hours as needed.  . ALPRAZolam (XANAX) 0.25 MG tablet TAKE 1 TABLET(0.25 MG) BY MOUTH TWICE DAILY AS NEEDED FOR ANXIETY  . aspirin 81 MG tablet Take 81 mg by mouth daily.    Marland Kitchen atorvastatin (LIPITOR) 20 MG tablet TAKE 1 TABLET BY MOUTH EVERY DAY  . B-D ULTRA-FINE 33 LANCETS MISC Use to test blood sugar twice daily dx: E11.9  . calcitonin, salmon, (MIACALCIN/FORTICAL) 200 UNIT/ACT nasal spray USE 1 SPRAY IN THE NOSE EVERY DAY(ALTERNATING NOSTRILS)  . CALCIUM-VITAMIN D PO Take 1 tablet by mouth daily.    Marland Kitchen  cetirizine (ZYRTEC) 1 MG/ML syrup Take 10 mg by mouth daily.  . Chromium 100 MCG TABS Take by mouth daily.    . Coenzyme Q10 200 MG capsule Take 200 mg by mouth daily.    . fish oil-omega-3 fatty acids 1000 MG capsule Take 1 g by mouth daily.    . fluticasone (FLONASE) 50 MCG/ACT nasal spray Place 2 sprays into both nostrils daily.  . folic acid (FOLVITE) 1 MG tablet Take 1 mg by mouth daily.    Marland Kitchen gabapentin (NEURONTIN) 100 MG capsule Take 1 capsule (100 mg total) by mouth 3 (three) times daily.  Marland Kitchen gentamicin ointment (GARAMYCIN) 0.1 % Apply 1 application topically 3 (three) times daily.  . hydrocortisone 2.5 % cream Apply topically 3 (three) times daily as needed.  . Insulin Glargine (LANTUS SOLOSTAR) 100 UNIT/ML Solostar Pen INJECT 22 UNITS UNDER THE SKIN EVERY MORNING.  . Insulin Pen Needle (B-D UF III MINI PEN NEEDLES) 31G X 5 MM MISC  Use to inject insulin 1-2 times daily  . levothyroxine (SYNTHROID, LEVOTHROID) 100 MCG tablet Take 100 mcg by mouth daily before breakfast.  . meclizine (ANTIVERT) 25 MG tablet Take 1 tablet (25 mg total) by mouth 3 (three) times daily as needed.  . metFORMIN (GLUCOPHAGE) 1000 MG tablet TAKING 2 IN THE MORNING AND 2 IN EVENING  . metoprolol succinate (TOPROL-XL) 50 MG 24 hr tablet Take 1 tablet (50 mg total) by mouth daily.  . montelukast (SINGULAIR) 10 MG tablet TAKE 1 TABLET BY MOUTH EVERY NIGHT AT BEDTIME  . Multiple Vitamin (MULTIVITAMIN) tablet Take 1 tablet by mouth daily.  Marland Kitchen omeprazole (PRILOSEC) 20 MG capsule TAKE 1 CAPSULE BY MOUTH TWICE DAILY BEFORE A MEAL  . vitamin B-12 (CYANOCOBALAMIN) 1000 MCG tablet Take 1,000 mcg by mouth daily.  . vitamin C (ASCORBIC ACID) 500 MG tablet Take 500 mg by mouth daily.   No facility-administered encounter medications on file as of 02/20/2020.

## 2020-03-19 ENCOUNTER — Encounter: Payer: Self-pay | Admitting: Family Medicine

## 2020-03-19 ENCOUNTER — Ambulatory Visit: Payer: Medicare PPO | Admitting: Family Medicine

## 2020-03-19 ENCOUNTER — Other Ambulatory Visit: Payer: Self-pay

## 2020-03-19 VITALS — BP 140/60 | HR 62 | Temp 97.6°F | Ht 62.5 in | Wt 177.0 lb

## 2020-03-19 DIAGNOSIS — S7011XA Contusion of right thigh, initial encounter: Secondary | ICD-10-CM | POA: Diagnosis not present

## 2020-03-19 DIAGNOSIS — S93492A Sprain of other ligament of left ankle, initial encounter: Secondary | ICD-10-CM

## 2020-03-19 DIAGNOSIS — S92214A Nondisplaced fracture of cuboid bone of right foot, initial encounter for closed fracture: Secondary | ICD-10-CM

## 2020-03-19 NOTE — Progress Notes (Signed)
Hannah Gowens T. Yosselin Zoeller, MD, [place] at [facility] [place], [address]  Phone: [phone]  FAX: [phone]  [name] - 86 y.o. female  MRN [id number]  Date of Birth: 1930/12/08  Date: 03/19/2020  PCP: Venia Carbon, MD  Referral: Venia Carbon, MD  Chief Complaint  Patient presents with  . Follow-up    Left Foot Fx    This visit occurred during the SARS-CoV-2 public health emergency.  Safety protocols were in place, including screening questions prior to the visit, additional usage of staff PPE, and extensive cleaning of exam room while observing appropriate contact time as indicated for disinfecting solutions.   Subjective:   [name] is a 85 y.o. very pleasant female patient with Body mass index is 31.86 kg/m. who presents with the following:  F/u ATFL sprain. Very small cuboid avulsion possible.  She is here following up status post very significant ATFL sprain after her left-sided foot and ankle inversion.  She also had a tiny avulsion fracture likely from that cuboid.  At this point the entirety of the midfoot is nontender, but she does have some ATFL tenderness.  She is able to walk without a limp.  This is without wearing her tennis shoes.  Originally I had placed her in an Aircast.  02/20/2020 Last OV with Owens Loffler, MD  She is a lady who fell and had a had an inversion injury on her left foot and ankle.  She had some immediate pain and swelling, and she also has some diffuse swelling and bruising now from the toes all the way up to her lower extremity.  Her primary pain is lateral.  Prior to this she had full functionality and required no assistive devices for motion.  At this point she is using a walker to assist.  She saw my partner Dr. Einar Pheasant on February 18, 2020.  And she asked for my opinion and review of the patient's radiographs in the  office.  She also struck her right thigh and has some pain in this region.  There is also bruising here as well  Small cuboid av lateral hip impact on the right.  She also had an inversion injury on the left ankle.ulsion   Review of Systems is noted in the HPI, as appropriate   Objective:   BP 140/60   Pulse 62   Temp 97.6 F (36.4 C) (Temporal)   Ht 5' 2.5" (1.588 m)   Wt 177 lb (80.3 kg)   SpO2 98%   BMI 31.86 kg/m   GEN: No acute distress; alert,appropriate. PULM: Breathing comfortably in no respiratory distress PSYCH: Normally interactive.    Walking normally with a modest limp.  Nontender throughout the tibia and fibula including the malleoli.  The entirety of the forefoot and midfoot is nontender.  Nontender at the deltoid and CFL ligaments, but she still does have some tenderness at the ATFL.  Nontender at the cuboid or the remainder of the bony anatomy.  Her thigh hematoma is well-healed.  Radiology: DG Ankle Complete Left  Result Date: 02/19/2020 CLINICAL DATA:  Lateral foot and ankle pain EXAM: LEFT ANKLE COMPLETE - 3+ VIEW; LEFT FOOT - COMPLETE 3+ VIEW COMPARISON:  None. FINDINGS: Small osseous fragment adjacent to the anterolateral aspect of the calcaneus adjacent to the calcaneocuboid joint which may reflect a small avulsion fracture fragment. Osseous structures appear otherwise intact. Ankle  mortise is congruent without dislocation. Small bilateral calcaneal enthesophytes. Small os peroneum. Mild first MTP joint space narrowing. Soft tissue swelling over the lateral ankle and hindfoot. IMPRESSION: Small osseous fragment adjacent to the anterolateral aspect of the calcaneus adjacent to the calcaneocuboid joint which may reflect a small avulsion fracture fragment. Correlation with point tenderness at this site is recommended. Electronically Signed   By: Duanne Guess D.O.   On: 02/19/2020 08:53   DG Foot Complete Left  Result Date: 02/19/2020 CLINICAL DATA:   Lateral foot and ankle pain EXAM: LEFT ANKLE COMPLETE - 3+ VIEW; LEFT FOOT - COMPLETE 3+ VIEW COMPARISON:  None. FINDINGS: Small osseous fragment adjacent to the anterolateral aspect of the calcaneus adjacent to the calcaneocuboid joint which may reflect a small avulsion fracture fragment. Osseous structures appear otherwise intact. Ankle mortise is congruent without dislocation. Small bilateral calcaneal enthesophytes. Small os peroneum. Mild first MTP joint space narrowing. Soft tissue swelling over the lateral ankle and hindfoot. IMPRESSION: Small osseous fragment adjacent to the anterolateral aspect of the calcaneus adjacent to the calcaneocuboid joint which may reflect a small avulsion fracture fragment. Correlation with point tenderness at this site is recommended. Electronically Signed   By: Duanne Guess D.O.   On: 02/19/2020 08:53    Assessment and Plan:     ICD-10-CM   1. Sprain of anterior talofibular ligament of left ankle, initial encounter  S93.492A   2. Closed nondisplaced fracture of cuboid of right foot, initial encounter  S92.214A   3. Hematoma of right thigh, initial encounter  S70.11XA    Better than expected recovery status post severe ATFL injury.  Minimal avulsion of the cuboid is completely healed clinically.  Reassurance regarding healed hematoma.  Resume activity as tolerated, walk normally.  Follow-up as needed   Signed,  Karleen Hampshire T. Kalid Ghan, MD   Outpatient Encounter Medications as of 03/19/2020  Medication Sig  . ACCU-CHEK SMARTVIEW test strip TEST BLOOD SUGAR BID UTD  . albuterol (PROVENTIL HFA;VENTOLIN HFA) 108 (90 Base) MCG/ACT inhaler Inhale 2 puffs into the lungs every 6 (six) hours as needed.  . ALPRAZolam (XANAX) 0.25 MG tablet TAKE 1 TABLET(0.25 MG) BY MOUTH TWICE DAILY AS NEEDED FOR ANXIETY  . aspirin 81 MG tablet Take 81 mg by mouth daily.    Marland Kitchen atorvastatin (LIPITOR) 20 MG tablet TAKE 1 TABLET BY MOUTH EVERY DAY  . B-D ULTRA-FINE 33 LANCETS MISC  Use to test blood sugar twice daily dx: E11.9  . calcitonin, salmon, (MIACALCIN/FORTICAL) 200 UNIT/ACT nasal spray USE 1 SPRAY IN THE NOSE EVERY DAY(ALTERNATING NOSTRILS)  . CALCIUM-VITAMIN D PO Take 1 tablet by mouth daily.    . cetirizine (ZYRTEC) 1 MG/ML syrup Take 10 mg by mouth daily.  . Chromium 100 MCG TABS Take by mouth daily.    . Coenzyme Q10 200 MG capsule Take 200 mg by mouth daily.    . fish oil-omega-3 fatty acids 1000 MG capsule Take 1 g by mouth daily.    . fluticasone (FLONASE) 50 MCG/ACT nasal spray Place 2 sprays into both nostrils daily.  . folic acid (FOLVITE) 1 MG tablet Take 1 mg by mouth daily.    Marland Kitchen gabapentin (NEURONTIN) 100 MG capsule Take 1 capsule (100 mg total) by mouth 3 (three) times daily.  Marland Kitchen gentamicin ointment (GARAMYCIN) 0.1 % Apply 1 application topically 3 (three) times daily.  . hydrocortisone 2.5 % cream Apply topically 3 (three) times daily as needed.  . Insulin Glargine (LANTUS SOLOSTAR) 100 UNIT/ML Solostar Pen  INJECT 22 UNITS UNDER THE SKIN EVERY MORNING.  . Insulin Pen Needle (B-D UF III MINI PEN NEEDLES) 31G X 5 MM MISC Use to inject insulin 1-2 times daily  . levothyroxine (SYNTHROID, LEVOTHROID) 100 MCG tablet Take 100 mcg by mouth daily before breakfast.  . meclizine (ANTIVERT) 25 MG tablet Take 1 tablet (25 mg total) by mouth 3 (three) times daily as needed.  . metFORMIN (GLUCOPHAGE-XR) 500 MG 24 hr tablet Take 1 tablet by mouth in the morning and at bedtime.  . metoprolol succinate (TOPROL-XL) 50 MG 24 hr tablet Take 1 tablet (50 mg total) by mouth daily.  . montelukast (SINGULAIR) 10 MG tablet TAKE 1 TABLET BY MOUTH EVERY NIGHT AT BEDTIME  . Multiple Vitamin (MULTIVITAMIN) tablet Take 1 tablet by mouth daily.  Marland Kitchen omeprazole (PRILOSEC) 20 MG capsule TAKE 1 CAPSULE BY MOUTH TWICE DAILY BEFORE A MEAL  . vitamin B-12 (CYANOCOBALAMIN) 1000 MCG tablet Take 1,000 mcg by mouth daily.  . vitamin C (ASCORBIC ACID) 500 MG tablet Take 500 mg by mouth daily.   . [DISCONTINUED] metFORMIN (GLUCOPHAGE) 1000 MG tablet TAKING 2 IN THE MORNING AND 2 IN EVENING   No facility-administered encounter medications on file as of 03/19/2020.

## 2020-05-16 ENCOUNTER — Other Ambulatory Visit: Payer: Self-pay | Admitting: Internal Medicine

## 2020-05-28 DIAGNOSIS — E669 Obesity, unspecified: Secondary | ICD-10-CM | POA: Diagnosis not present

## 2020-05-28 DIAGNOSIS — E785 Hyperlipidemia, unspecified: Secondary | ICD-10-CM | POA: Diagnosis not present

## 2020-05-28 DIAGNOSIS — E063 Autoimmune thyroiditis: Secondary | ICD-10-CM | POA: Diagnosis not present

## 2020-05-28 DIAGNOSIS — Z794 Long term (current) use of insulin: Secondary | ICD-10-CM | POA: Diagnosis not present

## 2020-05-28 DIAGNOSIS — E1159 Type 2 diabetes mellitus with other circulatory complications: Secondary | ICD-10-CM | POA: Diagnosis not present

## 2020-05-28 DIAGNOSIS — E1169 Type 2 diabetes mellitus with other specified complication: Secondary | ICD-10-CM | POA: Diagnosis not present

## 2020-05-28 DIAGNOSIS — I152 Hypertension secondary to endocrine disorders: Secondary | ICD-10-CM | POA: Diagnosis not present

## 2020-05-28 DIAGNOSIS — E1165 Type 2 diabetes mellitus with hyperglycemia: Secondary | ICD-10-CM | POA: Diagnosis not present

## 2020-05-29 ENCOUNTER — Telehealth: Payer: Self-pay

## 2020-05-29 NOTE — Telephone Encounter (Signed)
Pt started having diarrhea this morning. Had a slime to it. I advised her there is a stomach virus that has been going around. Also, she lives in [place] and was not boiling her water the last day. I advised her if she was not any better in the next day or she gets worse, go to the [facility] urgent care in [place]. EColi will have to be checked in stool.

## 2020-06-01 ENCOUNTER — Other Ambulatory Visit: Payer: Self-pay | Admitting: Internal Medicine

## 2020-06-01 NOTE — Telephone Encounter (Signed)
Last filled 02-04-20 #60 Last OV Acute 03-19-20 Next OV 09-03-20 [facility] [address] [employer] to [facility] in Dr Karle Starch absence

## 2020-06-03 DIAGNOSIS — E669 Obesity, unspecified: Secondary | ICD-10-CM | POA: Diagnosis not present

## 2020-06-03 DIAGNOSIS — E1165 Type 2 diabetes mellitus with hyperglycemia: Secondary | ICD-10-CM | POA: Diagnosis not present

## 2020-06-03 DIAGNOSIS — E785 Hyperlipidemia, unspecified: Secondary | ICD-10-CM | POA: Diagnosis not present

## 2020-06-03 DIAGNOSIS — Z794 Long term (current) use of insulin: Secondary | ICD-10-CM | POA: Diagnosis not present

## 2020-06-03 DIAGNOSIS — E1169 Type 2 diabetes mellitus with other specified complication: Secondary | ICD-10-CM | POA: Diagnosis not present

## 2020-06-03 DIAGNOSIS — E1159 Type 2 diabetes mellitus with other circulatory complications: Secondary | ICD-10-CM | POA: Diagnosis not present

## 2020-06-03 DIAGNOSIS — I152 Hypertension secondary to endocrine disorders: Secondary | ICD-10-CM | POA: Diagnosis not present

## 2020-06-03 DIAGNOSIS — E063 Autoimmune thyroiditis: Secondary | ICD-10-CM | POA: Diagnosis not present

## 2020-06-08 DIAGNOSIS — E113393 Type 2 diabetes mellitus with moderate nonproliferative diabetic retinopathy without macular edema, bilateral: Secondary | ICD-10-CM | POA: Diagnosis not present

## 2020-06-08 LAB — HM DIABETES EYE EXAM

## 2020-07-06 DIAGNOSIS — H903 Sensorineural hearing loss, bilateral: Secondary | ICD-10-CM | POA: Diagnosis not present

## 2020-07-06 DIAGNOSIS — H6123 Impacted cerumen, bilateral: Secondary | ICD-10-CM | POA: Diagnosis not present

## 2020-07-16 ENCOUNTER — Telehealth: Payer: Self-pay

## 2020-07-16 NOTE — Telephone Encounter (Signed)
I spoke with [name] (DPR signed) [name] said for a while now pt thinks she feels a little lump when she swallows and [name] said the appt already made on 07/24/20 with Dr Alphonsus Sias should be fine.  If pt's condition changes or worsens prior to appt [name] will cb. FYI to Dr Alphonsus Sias.

## 2020-07-16 NOTE — Telephone Encounter (Signed)
Spoke to pt. She will take it twice a day.

## 2020-07-16 NOTE — Telephone Encounter (Signed)
[facility] Day - Client TELEPHONE ADVICE RECORD AccessNurse Patient Name: [name] Gender: Female DOB: 1931/10/15 Age: 86 Y 5 M 16 D Return Phone Number: [phone] (Alternate) Address: City/State/Zip: [place] [address] Client [facility] Day - Client Client Site [facility] [place] - Day Physician Tillman Abide- MD Contact Type Call Who Is Calling Patient / Member / Family / Caregiver Call Type Triage / Clinical Relationship To Patient Self Return Phone Number [phone] (Alternate) Chief Complaint Abdominal Pain Reason for Call Symptomatic / Request for Health Information Initial Comment Having upper stomach pains and had headache last night. Does have reflux Translation No Nurse Assessment Nurse: Violeta Gelinas, RN, Regulatory affairs officer (Eastern Time): 07/15/2020 3:19:10 PM Confirm and document reason for call. If symptomatic, describe symptoms. ---pt states has been having upper stomach pains, taking medicine for reflux. states would like to have xray or whatever the dr wanted to do. Has the patient had close contact with a person known or suspected to have the novel coronavirus illness OR traveled / lives in area with major community spread (including international travel) in the last 14 days from the onset of symptoms? * If Asymptomatic, screen for exposure and travel within the last 14 days. ---No Does the patient have any new or worsening symptoms? ---Yes Will a triage be completed? ---Yes Related visit to physician within the last 2 weeks? ---N/A Does the PT have any chronic conditions? (i.e. diabetes, asthma, this includes High risk factors for pregnancy, etc.) ---Yes List chronic conditions. ---reflux Is this a behavioral health or substance abuse call? ---No Guidelines Guideline Title Affirmed Question Affirmed Notes Nurse Date/Time (Eastern Time) Abdominal Pain - Upper [1] Abdominal pain  is intermittent AND [2] shoots into chest, with sour taste in mouth Whiteley, RN, [facility] 07/15/2020 3:15:59 PM Disp. Time Lamount Cohen Time) Disposition Final User PLEASE NOTE: All timestamps contained within this report are represented as [place] Standard Time. CONFIDENTIALTY NOTICE: This fax transmission is intended only for the addressee. It contains information that is legally privileged, confidential or otherwise protected from use or disclosure. If you are not the intended recipient, you are strictly prohibited from reviewing, disclosing, copying using or disseminating any of this information or taking any action in reliance on or regarding this information. If you have received this fax in error, please notify us immediately by telephone so that we can arrange for its return to [place]. Phone: [phone], Toll-Free: [phone], Fax: [phone] Page: 2 of 2 Call Id: [id number] 07/15/2020 3:27:56 PM SEE PCP WITHIN 3 DAYS Yes Violeta Gelinas, RN, Control and instrumentation engineer Understands Yes PreDisposition Did not know what to do Care Advice Given Per Guideline SEE PCP WITHIN 3 DAYS: ANTACID: * If having pain now, try taking an antacid (e.g., Mylanta, Maalox). FOOD: * Eat smaller meals and avoid snacks for 2 hours before sleeping. * Avoid the following foods which tend to aggravate heartburn and stomach problems: fatty/greasy foods, spicy foods, caffeinated beverages, mints, and chocolate. CALL BACK IF: * You become worse. CARE ADVICE given per Abdominal Pain, Upper (Adult) guideline. Comments User: Adriana Simas, RN Date/Time Lamount Cohen Time): 07/15/2020 3:22:57 PM denies any current symptoms during triage. Referrals REFERRED TO PCP OFFICE

## 2020-07-16 NOTE — Telephone Encounter (Signed)
Okay to wait for next week but make sure she is taking the omeprazole twice a day still

## 2020-07-24 ENCOUNTER — Encounter: Payer: Self-pay | Admitting: Internal Medicine

## 2020-07-24 ENCOUNTER — Other Ambulatory Visit: Payer: Self-pay

## 2020-07-24 ENCOUNTER — Ambulatory Visit: Payer: Medicare PPO | Admitting: Internal Medicine

## 2020-07-24 VITALS — BP 128/70 | HR 60 | Temp 96.8°F | Ht 63.0 in | Wt 179.0 lb

## 2020-07-24 DIAGNOSIS — Z23 Encounter for immunization: Secondary | ICD-10-CM | POA: Diagnosis not present

## 2020-07-24 DIAGNOSIS — R1310 Dysphagia, unspecified: Secondary | ICD-10-CM | POA: Diagnosis not present

## 2020-07-24 DIAGNOSIS — R1319 Other dysphagia: Secondary | ICD-10-CM

## 2020-07-24 NOTE — Assessment & Plan Note (Signed)
Has resumed bid omeprazole and may be some better Discussed that if she is not completely symptom free in the next 2 weeks, should set back up with GI to consider EGD (saw Leone Payor years ago)

## 2020-07-24 NOTE — Progress Notes (Signed)
Subjective:    Patient ID: [name], female    DOB: Mar 29, 1931, 86 y.o.   MRN: [id number]  HPI Here due to trouble swallowing Here with daughter This visit occurred during the SARS-CoV-2 public health emergency.  Safety protocols were in place, including screening questions prior to the visit, additional usage of staff PPE, and extensive cleaning of exam room while observing appropriate contact time as indicated for disinfecting solutions.   She is tired of "dealing with my usual stomach problems" Gets a "lump" in her throat a lot Pain subxiphoid often as well Has only been taking the omeprazole once a day---concerned about potential side effects Did restart bid PPI in past week---symptoms may be some better  Current Outpatient Medications on File Prior to Visit  Medication Sig Dispense Refill  . ACCU-CHEK SMARTVIEW test strip TEST BLOOD SUGAR BID UTD  3  . albuterol (PROVENTIL HFA;VENTOLIN HFA) 108 (90 Base) MCG/ACT inhaler Inhale 2 puffs into the lungs every 6 (six) hours as needed. 18 g 1  . ALPRAZolam (XANAX) 0.25 MG tablet TAKE 1 TABLET(0.25 MG) BY MOUTH TWICE DAILY AS NEEDED FOR ANXIETY 60 tablet 0  . aspirin 81 MG tablet Take 81 mg by mouth daily.      Marland Kitchen atorvastatin (LIPITOR) 20 MG tablet TAKE 1 TABLET BY MOUTH EVERY DAY 90 tablet 3  . B-D ULTRA-FINE 33 LANCETS MISC Use to test blood sugar twice daily dx: E11.9 200 each 3  . calcitonin, salmon, (MIACALCIN/FORTICAL) 200 UNIT/ACT nasal spray USE 1 SPRAY IN THE NOSE EVERY DAY(ALTERNATING NOSTRILS) 3.7 mL 11  . CALCIUM-VITAMIN D PO Take 1 tablet by mouth daily.      . cetirizine (ZYRTEC) 1 MG/ML syrup Take 10 mg by mouth daily.    . Chromium 100 MCG TABS Take by mouth daily.      . Coenzyme Q10 200 MG capsule Take 200 mg by mouth daily.      . fish oil-omega-3 fatty acids 1000 MG capsule Take 1 g by mouth daily.      . fluticasone (FLONASE) 50 MCG/ACT nasal spray Place 2 sprays into both nostrils daily.    . folic acid  (FOLVITE) 1 MG tablet Take 1 mg by mouth daily.      Marland Kitchen gentamicin ointment (GARAMYCIN) 0.1 % Apply 1 application topically 3 (three) times daily.    . hydrocortisone 2.5 % cream Apply topically 3 (three) times daily as needed. 28 g 3  . Insulin Glargine (LANTUS SOLOSTAR) 100 UNIT/ML Solostar Pen 28 Units.     . Insulin Pen Needle (B-D UF III MINI PEN NEEDLES) 31G X 5 MM MISC Use to inject insulin 1-2 times daily 200 each 1  . levothyroxine (SYNTHROID, LEVOTHROID) 100 MCG tablet Take 100 mcg by mouth daily before breakfast.    . meclizine (ANTIVERT) 25 MG tablet Take 1 tablet (25 mg total) by mouth 3 (three) times daily as needed. 90 tablet 0  . metFORMIN (GLUCOPHAGE-XR) 500 MG 24 hr tablet Take 1 tablet by mouth in the morning and at bedtime.    . metoprolol succinate (TOPROL-XL) 50 MG 24 hr tablet Take 1 tablet (50 mg total) by mouth daily. 90 tablet 3  . montelukast (SINGULAIR) 10 MG tablet TAKE 1 TABLET BY MOUTH EVERY NIGHT AT BEDTIME 90 tablet 3  . Multiple Vitamin (MULTIVITAMIN) tablet Take 1 tablet by mouth daily.    Marland Kitchen omeprazole (PRILOSEC) 20 MG capsule TAKE 1 CAPSULE BY MOUTH TWICE DAILY BEFORE A MEAL 180  capsule 3  . vitamin B-12 (CYANOCOBALAMIN) 1000 MCG tablet Take 1,000 mcg by mouth daily.    . vitamin C (ASCORBIC ACID) 500 MG tablet Take 500 mg by mouth daily.     No current facility-administered medications on file prior to visit.    Allergies  Allergen Reactions  . Cefdinir     REACTION: unspecified  . Ciprofloxacin     rash  . Clarithromycin     REACTION: weird dreams (on prednisone at the same time but has tolerated this in the past)  . Miglitol     REACTION: unspecified  . Niacin     REACTION: unspecified  . Qvar [Beclomethasone] Other (See Comments)    Feels funny Feels funny    Past Medical History:  Diagnosis Date  . Allergy   . Asthma   . Diabetes mellitus   . Diastolic dysfunction    a. 06/2019 Echo: EF 60-65%, no rwma, GR1 DD. Mild MR. Nl RV fxn.  .  Essential hypertension   . GERD (gastroesophageal reflux disease)   . Hyperlipidemia   . Mixed hyperlipidemia   . Osteopenia   . PAF (paroxysmal atrial fibrillation) (HCC)    a. Dx in late 1990's - no known recurrence.  . Sleep disorder   . Thyroid disease     Past Surgical History:  Procedure Laterality Date  . CERVICAL CONE BIOPSY  1960's  . CHOLECYSTECTOMY    . NASAL POLYP SURGERY  01/01  . STRABISMUS SURGERY  1946 / 77  . VAGINAL DELIVERY     x2    Family History  Problem Relation Age of Onset  . Asthma Mother   . Coronary artery disease Father     Social History   Socioeconomic History  . Marital status: Married    Spouse name: Not on file  . Number of children: 2  . Years of education: Not on file  . Highest education level: Not on file  Occupational History    Employer: RETIRED  Tobacco Use  . Smoking status: Never Smoker  . Smokeless tobacco: Never Used  Vaping Use  . Vaping Use: Never used  Substance and Sexual Activity  . Alcohol use: Yes    Alcohol/week: 0.0 standard drinks    Comment: 1 glass of wine per day  . Drug use: No  . Sexual activity: Not on file  Other Topics Concern  . Not on file  Social History Narrative   Has living will   Not sure about formal POA --but requests husband, then daughter [name] (and son [name]) to be health care POAs   Would accept resuscitation attempts   Not sure about feeding tubes   Social Determinants of Health   Financial Resource Strain:   . Difficulty of Paying Living Expenses: Not on file  Food Insecurity:   . Worried About Programme researcher, broadcasting/film/video in the Last Year: Not on file  . Ran Out of Food in the Last Year: Not on file  Transportation Needs:   . Lack of Transportation (Medical): Not on file  . Lack of Transportation (Non-Medical): Not on file  Physical Activity:   . Days of Exercise per Week: Not on file  . Minutes of Exercise per Session: Not on file  Stress:   . Feeling of Stress : Not on file   Social Connections:   . Frequency of Communication with Friends and Family: Not on file  . Frequency of Social Gatherings with Friends and Family: Not on  file  . Attends Religious Services: Not on file  . Active Member of Clubs or Organizations: Not on file  . Attends Banker Meetings: Not on file  . Marital Status: Not on file  Intimate Partner Violence:   . Fear of Current or Ex-Partner: Not on file  . Emotionally Abused: Not on file  . Physically Abused: Not on file  . Sexually Abused: Not on file   Review of Systems  Not as hungry --appetite is down some Weight is stable No N/V Bowels okay No breathing problems--other than wearing mask     Objective:   Physical Exam Constitutional:      Appearance: Normal appearance.  Neck:     Comments: Thyroid not palpable Cardiovascular:     Rate and Rhythm: Normal rate and regular rhythm.     Heart sounds: No murmur heard.  No gallop.   Pulmonary:     Effort: Pulmonary effort is normal.     Breath sounds: Normal breath sounds. No wheezing or rales.  Abdominal:     General: There is no distension.     Palpations: Abdomen is soft. There is no mass.     Tenderness: There is no abdominal tenderness.  Musculoskeletal:     Cervical back: Neck supple.  Lymphadenopathy:     Cervical: No cervical adenopathy.  Neurological:     Mental Status: She is alert.            Assessment & Plan:

## 2020-07-24 NOTE — Addendum Note (Signed)
Addended by: Eual Fines on: 07/24/2020 11:14 AM   Modules accepted: Orders

## 2020-07-24 NOTE — Patient Instructions (Signed)
Please take the omeprazole twice a day on an empty stomach. If your swallowing problems aren't completely better in 2 weeks, let me know and I will set you back up with the GI specialist.

## 2020-08-14 ENCOUNTER — Other Ambulatory Visit: Payer: Self-pay | Admitting: Internal Medicine

## 2020-08-20 ENCOUNTER — Other Ambulatory Visit: Payer: Self-pay | Admitting: Internal Medicine

## 2020-08-20 NOTE — Telephone Encounter (Signed)
Last filled 06-01-20 #60 Last OV Acute 07-24-20 Next OV 09-03-20 [facility] [address]

## 2020-08-24 ENCOUNTER — Encounter: Payer: Medicare PPO | Admitting: Internal Medicine

## 2020-09-03 ENCOUNTER — Ambulatory Visit (INDEPENDENT_AMBULATORY_CARE_PROVIDER_SITE_OTHER): Payer: Medicare PPO | Admitting: Internal Medicine

## 2020-09-03 ENCOUNTER — Other Ambulatory Visit: Payer: Self-pay

## 2020-09-03 ENCOUNTER — Encounter: Payer: Self-pay | Admitting: Internal Medicine

## 2020-09-03 VITALS — BP 120/62 | HR 78 | Temp 98.6°F | Ht 63.0 in | Wt 176.4 lb

## 2020-09-03 DIAGNOSIS — Z7189 Other specified counseling: Secondary | ICD-10-CM | POA: Diagnosis not present

## 2020-09-03 DIAGNOSIS — E083293 Diabetes mellitus due to underlying condition with mild nonproliferative diabetic retinopathy without macular edema, bilateral: Secondary | ICD-10-CM

## 2020-09-03 DIAGNOSIS — Z794 Long term (current) use of insulin: Secondary | ICD-10-CM | POA: Diagnosis not present

## 2020-09-03 DIAGNOSIS — I700 Atherosclerosis of aorta: Secondary | ICD-10-CM | POA: Diagnosis not present

## 2020-09-03 DIAGNOSIS — K2100 Gastro-esophageal reflux disease with esophagitis, without bleeding: Secondary | ICD-10-CM

## 2020-09-03 DIAGNOSIS — J4521 Mild intermittent asthma with (acute) exacerbation: Secondary | ICD-10-CM

## 2020-09-03 DIAGNOSIS — F3489 Other specified persistent mood disorders: Secondary | ICD-10-CM

## 2020-09-03 DIAGNOSIS — F39 Unspecified mood [affective] disorder: Secondary | ICD-10-CM

## 2020-09-03 DIAGNOSIS — Z0000 Encounter for general adult medical examination without abnormal findings: Secondary | ICD-10-CM

## 2020-09-03 LAB — HM DIABETES FOOT EXAM

## 2020-09-03 NOTE — Assessment & Plan Note (Signed)
I have personally reviewed the Medicare Annual Wellness questionnaire and have noted 1. The patient's medical and social history 2. Their use of alcohol, tobacco or illicit drugs 3. Their current medications and supplements 4. The patient's functional ability including ADL's, fall risks, home safety risks and hearing or visual             impairment. 5. Diet and physical activities 6. Evidence for depression or mood disorders  The patients weight, height, BMI and visual acuity have been recorded in the chart I have made referrals, counseling and provided education to the patient based review of the above and I have provided the pt with a written personalized care plan for preventive services.  I have provided you with a copy of your personalized plan for preventive services. Please take the time to review along with your updated medication list.  Had flu vaccine Will get COVID booster No cancer screening due to age Discussed exercise

## 2020-09-03 NOTE — Assessment & Plan Note (Signed)
Mostly anxiety and sleep problems Some dysthymia but not MDD Uses the xanax mostly at night

## 2020-09-03 NOTE — Assessment & Plan Note (Signed)
See social history 

## 2020-09-03 NOTE — Assessment & Plan Note (Signed)
Back to daily omeprazole

## 2020-09-03 NOTE — Assessment & Plan Note (Signed)
On montelukast Hasn't needed the albuterol inhaler

## 2020-09-03 NOTE — Progress Notes (Signed)
Hearing Screening   125Hz  250Hz  500Hz  1000Hz  2000Hz  3000Hz  4000Hz  6000Hz  8000Hz   Right ear:           Left ear:           Patient has hering aids and evaluated by audiology.   Visual Acuity Screening   Right eye Left eye Both eyes  Without correction:     With correction: 20/20 20/30 20/30

## 2020-09-03 NOTE — Assessment & Plan Note (Signed)
On radiology  Is on statin 

## 2020-09-03 NOTE — Assessment & Plan Note (Signed)
Last A1c 7.6% On the insulin and metformin

## 2020-09-03 NOTE — Progress Notes (Signed)
Subjective:    Patient ID: [name], female    DOB: 1931/08/03, 86 y.o.   MRN: [id number]  HPI Here for Medicare wellness visit and follow up of chronic health conditions With daughter [name] This visit occurred during the SARS-CoV-2 public health emergency.  Safety protocols were in place, including screening questions prior to the visit, additional usage of staff PPE, and extensive cleaning of exam room while observing appropriate contact time as indicated for disinfecting solutions.   Reviewed form and advanced directives Reviewed other doctors Occasional drink of alcohol No tobacco Not really exercising--discussed Did have 1 fall with injury No persistent depression. Not anhedonic Vision is okay Hearing aides Hasn't been driving since daughter around--still can Does instrumental ADLs Mild memory issues  Continues at [facility] diabetes care Last A1c 7.6% Known mild retinopathy No foot pain or significant neuropathy symptoms (some left leg sensory changes though)  Gets occasional fleeting bladder symptoms Not like true UTI though Drinking no sugar cherry juice--- and cranberry pills (may be helping)  No chest pain No palpitations Feels a bit unsteady---but not really dizzy. Mostly after sitting for a while No syncope  Acid reflux seems better Going back to omeprazole daily again No dysphagia but does get subxiphiod sensation at times (with a bunch of pills)  Mood has been okay Anxiety still intermittently Uses the xanax prn only--mostly at night if can't sleep  Current Outpatient Medications on File Prior to Visit  Medication Sig Dispense Refill  . ACCU-CHEK SMARTVIEW test strip TEST BLOOD SUGAR BID UTD  3  . albuterol (PROVENTIL HFA;VENTOLIN HFA) 108 (90 Base) MCG/ACT inhaler Inhale 2 puffs into the lungs every 6 (six) hours as needed. 18 g 1  . ALPRAZolam (XANAX) 0.25 MG tablet TAKE 1 TABLET(0.25 MG) BY MOUTH TWICE DAILY AS NEEDED FOR  ANXIETY 60 tablet 0  . aspirin 81 MG tablet Take 81 mg by mouth daily.      Marland Kitchen atorvastatin (LIPITOR) 20 MG tablet TAKE 1 TABLET BY MOUTH EVERY DAY 90 tablet 3  . B-D ULTRA-FINE 33 LANCETS MISC Use to test blood sugar twice daily dx: E11.9 200 each 3  . calcitonin, salmon, (MIACALCIN/FORTICAL) 200 UNIT/ACT nasal spray USE 1 SPRAY IN THE NOSE EVERY DAY(ALTERNATING NOSTRILS) 3.7 mL 11  . CALCIUM-VITAMIN D PO Take 1 tablet by mouth daily.      . cetirizine (ZYRTEC) 1 MG/ML syrup Take 10 mg by mouth daily.    . Chromium 100 MCG TABS Take by mouth daily.      . Coenzyme Q10 200 MG capsule Take 200 mg by mouth daily.      . fish oil-omega-3 fatty acids 1000 MG capsule Take 1 g by mouth daily.      . fluticasone (FLONASE) 50 MCG/ACT nasal spray Place 2 sprays into both nostrils daily.    . folic acid (FOLVITE) 1 MG tablet Take 1 mg by mouth daily.      Marland Kitchen gentamicin ointment (GARAMYCIN) 0.1 % Apply 1 application topically 3 (three) times daily.    . hydrocortisone 2.5 % cream Apply topically 3 (three) times daily as needed. 28 g 3  . Insulin Glargine (LANTUS SOLOSTAR) 100 UNIT/ML Solostar Pen 28 Units.     . Insulin Pen Needle (B-D UF III MINI PEN NEEDLES) 31G X 5 MM MISC USE ONCE TO TWICE DAILY 200 each 4  . levothyroxine (SYNTHROID, LEVOTHROID) 100 MCG tablet Take 100 mcg by mouth daily before breakfast.    .  meclizine (ANTIVERT) 25 MG tablet Take 1 tablet (25 mg total) by mouth 3 (three) times daily as needed. 90 tablet 0  . metFORMIN (GLUCOPHAGE-XR) 500 MG 24 hr tablet Take 1 tablet by mouth in the morning and at bedtime.    . metoprolol succinate (TOPROL-XL) 50 MG 24 hr tablet Take 1 tablet (50 mg total) by mouth daily. 90 tablet 3  . montelukast (SINGULAIR) 10 MG tablet TAKE 1 TABLET BY MOUTH EVERY NIGHT AT BEDTIME 90 tablet 3  . Multiple Vitamin (MULTIVITAMIN) tablet Take 1 tablet by mouth daily.    Marland Kitchen omeprazole (PRILOSEC) 20 MG capsule TAKE 1 CAPSULE BY MOUTH TWICE DAILY BEFORE A MEAL 180  capsule 3  . vitamin B-12 (CYANOCOBALAMIN) 1000 MCG tablet Take 1,000 mcg by mouth daily.    . vitamin C (ASCORBIC ACID) 500 MG tablet Take 500 mg by mouth daily.     No current facility-administered medications on file prior to visit.    Allergies  Allergen Reactions  . Cefdinir     REACTION: unspecified  . Ciprofloxacin     rash  . Clarithromycin     REACTION: weird dreams (on prednisone at the same time but has tolerated this in the past)  . Miglitol     REACTION: unspecified  . Niacin     REACTION: unspecified  . Qvar [Beclomethasone] Other (See Comments)    Feels funny Feels funny    Past Medical History:  Diagnosis Date  . Allergy   . Asthma   . Diabetes mellitus   . Diastolic dysfunction    a. 06/2019 Echo: EF 60-65%, no rwma, GR1 DD. Mild MR. Nl RV fxn.  . Essential hypertension   . GERD (gastroesophageal reflux disease)   . Hyperlipidemia   . Mixed hyperlipidemia   . Osteopenia   . PAF (paroxysmal atrial fibrillation) (HCC)    a. Dx in late 1990's - no known recurrence.  . Sleep disorder   . Thyroid disease     Past Surgical History:  Procedure Laterality Date  . CERVICAL CONE BIOPSY  1960's  . CHOLECYSTECTOMY    . NASAL POLYP SURGERY  01/01  . STRABISMUS SURGERY  1946 / 7  . VAGINAL DELIVERY     x2    Family History  Problem Relation Age of Onset  . Asthma Mother   . Coronary artery disease Father     Social History   Socioeconomic History  . Marital status: Married    Spouse name: Not on file  . Number of children: 2  . Years of education: Not on file  . Highest education level: Not on file  Occupational History    Employer: RETIRED  Tobacco Use  . Smoking status: Never Smoker  . Smokeless tobacco: Never Used  Vaping Use  . Vaping Use: Never used  Substance and Sexual Activity  . Alcohol use: Yes    Alcohol/week: 1.0 standard drink    Types: 1 Glasses of wine per week    Comment: 1 glass of wine per day  . Drug use: No  .  Sexual activity: Not Currently  Other Topics Concern  . Not on file  Social History Narrative   Has living will   Not sure about formal POA --but requests husband, then daughter [name] (and son [name]) to be health care POAs   Would accept resuscitation attempts   Not sure about feeding tubes   Social Determinants of Health   Financial Resource Strain:   .  Difficulty of Paying Living Expenses: Not on file  Food Insecurity:   . Worried About Programme researcher, broadcasting/film/video in the Last Year: Not on file  . Ran Out of Food in the Last Year: Not on file  Transportation Needs:   . Lack of Transportation (Medical): Not on file  . Lack of Transportation (Non-Medical): Not on file  Physical Activity:   . Days of Exercise per Week: Not on file  . Minutes of Exercise per Session: Not on file  Stress:   . Feeling of Stress : Not on file  Social Connections:   . Frequency of Communication with Friends and Family: Not on file  . Frequency of Social Gatherings with Friends and Family: Not on file  . Attends Religious Services: Not on file  . Active Member of Clubs or Organizations: Not on file  . Attends Banker Meetings: Not on file  . Marital Status: Not on file  Intimate Partner Violence:   . Fear of Current or Ex-Partner: Not on file  . Emotionally Abused: Not on file  . Physically Abused: Not on file  . Sexually Abused: Not on file   Review of Systems Appetite is okay Weight is stable Some sleep issues Wears seat belt Teeth okay---keeps up with dentist No current suspicious skin lesions Bowels move okay--uses prunes prn. Not getting diarrhea with metformin No dysuria or hematuria now Aches and pains---especially shoulders. No specific arthritic problems Occasional tingling in right arm--better with movement    Objective:   Physical Exam Constitutional:      Appearance: Normal appearance.  HENT:     Mouth/Throat:     Comments: No lesions Eyes:     Conjunctiva/sclera:  Conjunctivae normal.     Pupils: Pupils are equal, round, and reactive to light.  Cardiovascular:     Rate and Rhythm: Normal rate and regular rhythm.     Pulses: Normal pulses.     Heart sounds: No murmur heard.  No gallop.   Pulmonary:     Effort: Pulmonary effort is normal.     Breath sounds: Normal breath sounds. No wheezing or rales.  Abdominal:     Palpations: Abdomen is soft.     Tenderness: There is no abdominal tenderness.  Musculoskeletal:     Cervical back: Neck supple.     Right lower leg: No edema.     Left lower leg: No edema.  Lymphadenopathy:     Cervical: No cervical adenopathy.  Skin:    Findings: No rash.     Comments: No foot lesions  Neurological:     Mental Status: She is alert and oriented to person, place, and time.     Comments: President---"Joe Leeroy Bock, Bush----black guy" [phone] [id number] Recall 3/3  Mildly decreased sensation in feet  Psychiatric:        Mood and Affect: Mood normal.        Behavior: Behavior normal.            Assessment & Plan:

## 2020-09-05 ENCOUNTER — Other Ambulatory Visit: Payer: Self-pay | Admitting: Internal Medicine

## 2020-10-03 ENCOUNTER — Ambulatory Visit: Payer: Medicare PPO

## 2020-10-13 DIAGNOSIS — J019 Acute sinusitis, unspecified: Secondary | ICD-10-CM | POA: Diagnosis not present

## 2020-10-28 ENCOUNTER — Other Ambulatory Visit: Payer: Self-pay | Admitting: Internal Medicine

## 2020-10-28 NOTE — Telephone Encounter (Signed)
Last filled 08-20-20 #60 Last OV  09-03-20 Next OV 09-09-21 [facility] [address]

## 2020-11-11 DIAGNOSIS — J309 Allergic rhinitis, unspecified: Secondary | ICD-10-CM | POA: Diagnosis not present

## 2020-11-11 DIAGNOSIS — J329 Chronic sinusitis, unspecified: Secondary | ICD-10-CM | POA: Diagnosis not present

## 2020-11-11 DIAGNOSIS — H6122 Impacted cerumen, left ear: Secondary | ICD-10-CM | POA: Diagnosis not present

## 2020-11-20 DIAGNOSIS — E113393 Type 2 diabetes mellitus with moderate nonproliferative diabetic retinopathy without macular edema, bilateral: Secondary | ICD-10-CM | POA: Diagnosis not present

## 2020-12-06 ENCOUNTER — Other Ambulatory Visit: Payer: Self-pay | Admitting: Cardiovascular Disease

## 2020-12-09 DIAGNOSIS — J329 Chronic sinusitis, unspecified: Secondary | ICD-10-CM | POA: Diagnosis not present

## 2020-12-29 NOTE — Progress Notes (Signed)
Cardiology Office Note  Date:  01/01/2021   ID:  [name], [name] 03-22-1931, MRN [id number]  PCP:  Hannah Schwalbe, MD   Chief Complaint  Patient presents with  . Annual Exam    Pt states she has been doing well except with the nerves in her upper back and into her arms. Also states one day her BP went up to 190s systolic, but came down and did not happen again. Happened a month and a half ago.    HPI:  [name] is a 87 y.o. female with a hx of paroxysmal atrial fibrillation. diagnosed 23 years ago, age 32 In hospital ,lasted 1 week,  none since then ("heart was feeling really weird") chadsvasc 4, on asa, does not want NOAC HTN Hyperlipidemia DM II, followed by Dr. Renae [name],  Who presents for routine follow-up of her palpitations/atrial fibrillation  Daughter presents with her today  Last seen in clinic by myself January 2021 At that time felt well, no shortness of breath or chest pain, no exercise Walked in, long distance  No arrhythmia, on metoprolol On lipitor  One episode, woke in AM, BP was high Anxious  Caretaker burnout, taking care of her husband, daughter has to help Husband is very needy, Causing broken sleep at nighttime, she is feeling very tired  Try to stay active but no regular exercise program  Lab work reviewed with her HBA1C 7.6 Total chol 109, LDL 27  EKG personally reviewed by myself on todays visit Shows normal sinus rhythm with rate 69 bpm no significant ST or T wave changes  Prior dizzy episodes 2020 CT and MRI head Severe stenosis of right posterior artery Diffuse atherosclerosis.  On aspirin  Echo 06/2016: Normal EF >55%, normal LA size Event monitor 06/2016: NSR, no atrial fib  episode of chest pain, in the ED 07/2016  She did have palpitations September 2017, went to the hospital at that time No arrhythmia noted Follow-up event monitor did not show arrhythmia  CT chest  2015: Atherosclerotic calcification aorta and  minimally in coronary arteries.   PMH:   has a past medical history of Allergy, Asthma, Diabetes mellitus, Diastolic dysfunction, Essential hypertension, GERD (gastroesophageal reflux disease), Hyperlipidemia, Mixed hyperlipidemia, Osteopenia, PAF (paroxysmal atrial fibrillation) (HCC), Sleep disorder, and Thyroid disease.  PSH:    Past Surgical History:  Procedure Laterality Date  . CERVICAL CONE BIOPSY  1960's  . CHOLECYSTECTOMY    . NASAL POLYP SURGERY  01/01  . STRABISMUS SURGERY  1946 / 34  . VAGINAL DELIVERY     x2    Current Outpatient Medications  Medication Sig Dispense Refill  . ACCU-CHEK SMARTVIEW test strip TEST BLOOD SUGAR BID UTD  3  . albuterol (PROVENTIL HFA;VENTOLIN HFA) 108 (90 Base) MCG/ACT inhaler Inhale 2 puffs into the lungs every 6 (six) hours as needed. 18 g 1  . ALPRAZolam (XANAX) 0.25 MG tablet TAKE 1 TABLET(0.25 MG) BY MOUTH TWICE DAILY AS NEEDED FOR ANXIETY 60 tablet 0  . aspirin 81 MG tablet Take 81 mg by mouth daily.    Marland Kitchen atorvastatin (LIPITOR) 20 MG tablet TAKE 1 TABLET BY MOUTH EVERY DAY 90 tablet 3  . B-D ULTRA-FINE 33 LANCETS MISC Use to test blood sugar twice daily dx: E11.9 200 each 3  . calcitonin, salmon, (MIACALCIN/FORTICAL) 200 UNIT/ACT nasal spray USE 1 SPRAY IN THE NOSE EVERY DAY(ALTERNATING NOSTRILS) 3.7 mL 11  . CALCIUM-VITAMIN D PO Take 1 tablet by mouth daily.    Marland Kitchen  cetirizine (ZYRTEC) 1 MG/ML syrup Take 10 mg by mouth daily.    . Chromium 100 MCG TABS Take by mouth daily.    . Coenzyme Q10 200 MG capsule Take 200 mg by mouth daily.    . fish oil-omega-3 fatty acids 1000 MG capsule Take 1 g by mouth daily.    . fluticasone (FLONASE) 50 MCG/ACT nasal spray Place 2 sprays into both nostrils daily.    . folic acid (FOLVITE) 1 MG tablet Take 1 mg by mouth daily.    Marland Kitchen gentamicin ointment (GARAMYCIN) 0.1 % Apply 1 application topically 3 (three) times daily.    . hydrocortisone 2.5 % cream Apply topically 3 (three) times daily as needed. 28 g  3  . insulin glargine (LANTUS) 100 UNIT/ML Solostar Pen 28 Units.     . Insulin Pen Needle (B-D UF III MINI PEN NEEDLES) 31G X 5 MM MISC USE ONCE TO TWICE DAILY 200 each 4  . levothyroxine (SYNTHROID, LEVOTHROID) 100 MCG tablet Take 100 mcg by mouth daily before breakfast.    . meclizine (ANTIVERT) 25 MG tablet Take 1 tablet (25 mg total) by mouth 3 (three) times daily as needed. 90 tablet 0  . metFORMIN (GLUCOPHAGE-XR) 500 MG 24 hr tablet Take 1 tablet by mouth in the morning and at bedtime.    . metoprolol succinate (TOPROL-XL) 50 MG 24 hr tablet TAKE 1 TABLET(50 MG) BY MOUTH DAILY 90 tablet 3  . montelukast (SINGULAIR) 10 MG tablet TAKE 1 TABLET BY MOUTH EVERY NIGHT AT BEDTIME 90 tablet 3  . Multiple Vitamin (MULTIVITAMIN) tablet Take 1 tablet by mouth daily.    Marland Kitchen omeprazole (PRILOSEC) 20 MG capsule TAKE 1 CAPSULE BY MOUTH TWICE DAILY BEFORE A MEAL 180 capsule 3  . vitamin B-12 (CYANOCOBALAMIN) 1000 MCG tablet Take 1,000 mcg by mouth daily.    . vitamin C (ASCORBIC ACID) 500 MG tablet Take 500 mg by mouth daily.     No current facility-administered medications for this visit.     Allergies:   Cefdinir, Ciprofloxacin, Clarithromycin, Miglitol, Niacin, and Qvar [beclomethasone]   Social History:  The patient  reports that she has never smoked. She has never used smokeless tobacco. She reports current alcohol use of about 1.0 standard drink of alcohol per week. She reports that she does not use drugs.   Family History:   family history includes Asthma in her mother; Coronary artery disease in her father.    Review of Systems: Review of Systems  Constitutional: Negative.   Respiratory: Negative.   Cardiovascular: Negative.   Gastrointestinal: Negative.   Musculoskeletal: Negative.   Neurological: Negative.   Psychiatric/Behavioral: Negative.   All other systems reviewed and are negative.   PHYSICAL EXAM: VS:  BP (!) 142/72   Pulse 69   Ht 5\' 3"  (1.6 m)   Wt 175 lb (79.4 kg)    BMI 31.00 kg/m  , BMI Body mass index is 31 kg/m. Constitutional:  oriented to person, place, and time. No distress.  HENT:  Head: Grossly normal Eyes:  no discharge. No scleral icterus.  Neck: No JVD, no carotid bruits  Cardiovascular: Regular rate and rhythm, no murmurs appreciated Pulmonary/Chest: Clear to auscultation bilaterally, no wheezes or rails Abdominal: Soft.  no distension.  no tenderness.  Musculoskeletal: Normal range of motion Neurological:  normal muscle tone. Coordination normal. No atrophy Skin: Skin warm and dry Psychiatric: normal affect, pleasant   Recent Labs: No results found for requested labs within last 8760 hours.  Lipid Panel Lab Results  Component Value Date   CHOL 91 02/05/2018   HDL 48.90 02/05/2018   LDLCALC 25 02/05/2018   TRIG 83.0 02/05/2018     Wt Readings from Last 3 Encounters:  01/01/21 175 lb (79.4 kg)  09/03/20 176 lb 6.4 oz (80 kg)  07/24/20 179 lb (81.2 kg)     ASSESSMENT AND PLAN:  Paroxysmal atrial fibrillation (HCC) episode >20 years ago Not much documentation of atrial fib since then On asa, did not want NOAC EKG normal on today's visit, denies any symptoms concerning for arrhythmia  Essential hypertension Blood pressure is well controlled on today's visit. No changes made to the medications.  Mixed hyperlipidemia Cholesterol is at goal on the current lipid regimen. No changes to the medications were made.  Cerebrovascular disease On aspirin, cholesterol at goal  Aortic atherosclerosis (HCC) Seen on CT scan Cholesterol at goal  Diabetes: HGBA1C 7.6 Recommend diet modification, walking program  Stress/adjustment disorder Most of her visit spent discussing stressors at home, husband who is very needy, Causing broken sleep, extra stress   Total encounter time more than 25 minutes  Greater than 50% was spent in counseling and coordination of care with the patient     No orders of the defined types  were placed in this encounter.    Signed, Dossie Arbour, M.D., Ph.D. 01/01/2021  [facility] [place], [place] [phone]

## 2021-01-01 ENCOUNTER — Encounter: Payer: Self-pay | Admitting: Cardiovascular Disease

## 2021-01-01 ENCOUNTER — Ambulatory Visit: Payer: Medicare PPO | Admitting: Cardiovascular Disease

## 2021-01-01 ENCOUNTER — Other Ambulatory Visit: Payer: Self-pay

## 2021-01-01 VITALS — BP 142/72 | HR 69 | Ht 63.0 in | Wt 175.0 lb

## 2021-01-01 DIAGNOSIS — I10 Essential (primary) hypertension: Secondary | ICD-10-CM

## 2021-01-01 DIAGNOSIS — E782 Mixed hyperlipidemia: Secondary | ICD-10-CM | POA: Diagnosis not present

## 2021-01-01 DIAGNOSIS — I480 Paroxysmal atrial fibrillation: Secondary | ICD-10-CM

## 2021-01-01 DIAGNOSIS — I700 Atherosclerosis of aorta: Secondary | ICD-10-CM | POA: Diagnosis not present

## 2021-01-01 NOTE — Patient Instructions (Signed)
Medication Instructions:  No changes  If you need a refill on your cardiac medications before your next appointment, please call your pharmacy.    Lab work: No new labs needed   If you have labs (blood work) drawn today and your tests are completely normal, you will receive your results only by: . MyChart Message (if you have MyChart) OR . A paper copy in the mail If you have any lab test that is abnormal or we need to change your treatment, we will call you to review the results.   Testing/Procedures: No new testing needed   Follow-Up: At [facility], you and your health needs are our priority.  As part of our continuing mission to provide you with exceptional heart care, we have created designated Provider Care Teams.  These Care Teams include your primary Cardiologist (physician) and Advanced Practice Providers (APPs -  Physician Assistants and Nurse Practitioners) who all work together to provide you with the care you need, when you need it.  . You will need a follow up appointment in 12 months  . Providers on your designated Care Team:   . Christopher Berge, NP . Ryan Dunn, PA-C . Jacquelyn Visser, PA-C  Any Other Special Instructions Will Be Listed Below (If Applicable).  COVID-19 Vaccine Information can be found at: [email or web address] For questions related to vaccine distribution or appointments, please email [email or web address] or call [phone].     

## 2021-01-18 ENCOUNTER — Other Ambulatory Visit: Payer: Self-pay | Admitting: Internal Medicine

## 2021-01-19 NOTE — Telephone Encounter (Signed)
Last filled 10-29-20 #60 Last OV 09-03-20 Next OV 09-09-21 [facility] [address]

## 2021-01-25 DIAGNOSIS — E1159 Type 2 diabetes mellitus with other circulatory complications: Secondary | ICD-10-CM | POA: Diagnosis not present

## 2021-01-25 DIAGNOSIS — E1165 Type 2 diabetes mellitus with hyperglycemia: Secondary | ICD-10-CM | POA: Diagnosis not present

## 2021-01-25 DIAGNOSIS — E785 Hyperlipidemia, unspecified: Secondary | ICD-10-CM | POA: Diagnosis not present

## 2021-01-25 DIAGNOSIS — Z794 Long term (current) use of insulin: Secondary | ICD-10-CM | POA: Diagnosis not present

## 2021-01-25 DIAGNOSIS — E1169 Type 2 diabetes mellitus with other specified complication: Secondary | ICD-10-CM | POA: Diagnosis not present

## 2021-01-25 DIAGNOSIS — I152 Hypertension secondary to endocrine disorders: Secondary | ICD-10-CM | POA: Diagnosis not present

## 2021-01-25 DIAGNOSIS — E063 Autoimmune thyroiditis: Secondary | ICD-10-CM | POA: Diagnosis not present

## 2021-01-25 DIAGNOSIS — E669 Obesity, unspecified: Secondary | ICD-10-CM | POA: Diagnosis not present

## 2021-03-19 ENCOUNTER — Encounter: Payer: Self-pay | Admitting: Internal Medicine

## 2021-03-19 ENCOUNTER — Other Ambulatory Visit: Payer: Self-pay

## 2021-03-19 ENCOUNTER — Ambulatory Visit: Payer: Medicare PPO | Admitting: Internal Medicine

## 2021-03-19 VITALS — BP 130/74 | HR 63 | Temp 96.8°F | Ht 63.0 in | Wt 175.0 lb

## 2021-03-19 DIAGNOSIS — J45909 Unspecified asthma, uncomplicated: Secondary | ICD-10-CM | POA: Insufficient documentation

## 2021-03-19 DIAGNOSIS — J4520 Mild intermittent asthma, uncomplicated: Secondary | ICD-10-CM

## 2021-03-19 MED ORDER — PREDNISONE 20 MG PO TABS: 40.0000 mg | ORAL_TABLET | Freq: Every day | ORAL | 0 refills | Status: DC

## 2021-03-19 NOTE — Assessment & Plan Note (Signed)
Mild symptoms Will give a few days of prednisone Continue singulair and flonase Add daily cetirizine--and can take loratadine 10-20mg  daily in addition (should continue this till the end of pollen season)

## 2021-03-19 NOTE — Patient Instructions (Signed)
Please take the prednisone for 6 days as directed. Continue the singular and flonase. Add daily cetirizine 10mg  (at least till the end of the allergy season). If you have allergy symptoms even with all this, you can add loratadine 10mg  daily as well.

## 2021-03-19 NOTE — Progress Notes (Signed)
Subjective:    Patient ID: [name], female    DOB: 01-04-1931, 87 y.o.   MRN: [id number]  HPI Here due to worsened allergy symptoms With daughter This visit occurred during the SARS-CoV-2 public health emergency.  Safety protocols were in place, including screening questions prior to the visit, additional usage of staff PPE, and extensive cleaning of exam room while observing appropriate contact time as indicated for disinfecting solutions.   Feels an itch in her bronchial tubes--makes her cough Then nose starts running Is on singulair, flonase Hasn't been on the zyrec Not even going outside but the pollen getting into the house  No SOB No fever Hasn't used the albuterol inhaler---hasn't felt SOB Loose mucus in chest---brings it up with cough Daughter can hear some wheeze  Current Outpatient Medications on File Prior to Visit  Medication Sig Dispense Refill  . ACCU-CHEK SMARTVIEW test strip TEST BLOOD SUGAR BID UTD  3  . albuterol (PROVENTIL HFA;VENTOLIN HFA) 108 (90 Base) MCG/ACT inhaler Inhale 2 puffs into the lungs every 6 (six) hours as needed. 18 g 1  . ALPRAZolam (XANAX) 0.25 MG tablet TAKE 1 TABLET(0.25 MG) BY MOUTH TWICE DAILY AS NEEDED FOR ANXIETY 60 tablet 0  . aspirin 81 MG tablet Take 81 mg by mouth daily.    Marland Kitchen atorvastatin (LIPITOR) 20 MG tablet TAKE 1 TABLET BY MOUTH EVERY DAY 90 tablet 3  . B-D ULTRA-FINE 33 LANCETS MISC Use to test blood sugar twice daily dx: E11.9 200 each 3  . calcitonin, salmon, (MIACALCIN/FORTICAL) 200 UNIT/ACT nasal spray USE 1 SPRAY IN THE NOSE EVERY DAY(ALTERNATING NOSTRILS) 3.7 mL 11  . CALCIUM-VITAMIN D PO Take 1 tablet by mouth daily.    . Chromium 100 MCG TABS Take by mouth daily.    . Coenzyme Q10 200 MG capsule Take 200 mg by mouth daily.    . fish oil-omega-3 fatty acids 1000 MG capsule Take 1 g by mouth daily.    . fluticasone (FLONASE) 50 MCG/ACT nasal spray Place 2 sprays into both nostrils daily.    . folic acid (FOLVITE)  1 MG tablet Take 1 mg by mouth daily.    Marland Kitchen gentamicin ointment (GARAMYCIN) 0.1 % Apply 1 application topically 3 (three) times daily.    . hydrocortisone 2.5 % cream Apply topically 3 (three) times daily as needed. 28 g 3  . insulin glargine (LANTUS) 100 UNIT/ML Solostar Pen 28 Units.     . Insulin Pen Needle (B-D UF III MINI PEN NEEDLES) 31G X 5 MM MISC USE ONCE TO TWICE DAILY 200 each 4  . levothyroxine (SYNTHROID, LEVOTHROID) 100 MCG tablet Take 100 mcg by mouth daily before breakfast.    . meclizine (ANTIVERT) 25 MG tablet Take 1 tablet (25 mg total) by mouth 3 (three) times daily as needed. 90 tablet 0  . metFORMIN (GLUCOPHAGE-XR) 500 MG 24 hr tablet Take 1 tablet by mouth in the morning and at bedtime.    . metoprolol succinate (TOPROL-XL) 50 MG 24 hr tablet TAKE 1 TABLET(50 MG) BY MOUTH DAILY 90 tablet 3  . montelukast (SINGULAIR) 10 MG tablet TAKE 1 TABLET BY MOUTH EVERY NIGHT AT BEDTIME 90 tablet 3  . Multiple Vitamin (MULTIVITAMIN) tablet Take 1 tablet by mouth daily.    Marland Kitchen omeprazole (PRILOSEC) 20 MG capsule TAKE 1 CAPSULE BY MOUTH TWICE DAILY BEFORE A MEAL 180 capsule 3  . vitamin B-12 (CYANOCOBALAMIN) 1000 MCG tablet Take 1,000 mcg by mouth daily.    . vitamin  C (ASCORBIC ACID) 500 MG tablet Take 500 mg by mouth daily.    . cetirizine (ZYRTEC) 1 MG/ML syrup Take 10 mg by mouth daily. (Patient not taking: Reported on 03/19/2021)     No current facility-administered medications on file prior to visit.    Allergies  Allergen Reactions  . Cefdinir     REACTION: unspecified  . Ciprofloxacin     rash  . Clarithromycin     REACTION: weird dreams (on prednisone at the same time but has tolerated this in the past)  . Miglitol     REACTION: unspecified  . Niacin     REACTION: unspecified  . Qvar [Beclomethasone] Other (See Comments)    Feels funny Feels funny    Past Medical History:  Diagnosis Date  . Allergy   . Asthma   . Diabetes mellitus   . Diastolic dysfunction    a.  06/2019 Echo: EF 60-65%, no rwma, GR1 DD. Mild MR. Nl RV fxn.  . Essential hypertension   . GERD (gastroesophageal reflux disease)   . Hyperlipidemia   . Mixed hyperlipidemia   . Osteopenia   . PAF (paroxysmal atrial fibrillation) (HCC)    a. Dx in late 1990's - no known recurrence.  . Sleep disorder   . Thyroid disease     Past Surgical History:  Procedure Laterality Date  . CERVICAL CONE BIOPSY  1960's  . CHOLECYSTECTOMY    . NASAL POLYP SURGERY  01/01  . STRABISMUS SURGERY  1946 / 28  . VAGINAL DELIVERY     x2    Family History  Problem Relation Age of Onset  . Asthma Mother   . Coronary artery disease Father     Social History   Socioeconomic History  . Marital status: Married    Spouse name: Not on file  . Number of children: 2  . Years of education: Not on file  . Highest education level: Not on file  Occupational History    Employer: RETIRED  Tobacco Use  . Smoking status: Never Smoker  . Smokeless tobacco: Never Used  Vaping Use  . Vaping Use: Never used  Substance and Sexual Activity  . Alcohol use: Yes    Alcohol/week: 1.0 standard drink    Types: 1 Glasses of wine per week    Comment: 1 glass of wine per day  . Drug use: No  . Sexual activity: Not Currently  Other Topics Concern  . Not on file  Social History Narrative   Has living will   Not sure about formal POA --but requests husband, then daughter [name] (and son [name]) to be health care POAs   Would accept resuscitation attempts   Not sure about feeding tubes   Social Determinants of Health   Financial Resource Strain: Not on file  Food Insecurity: Not on file  Transportation Needs: Not on file  Physical Activity: Not on file  Stress: Not on file  Social Connections: Not on file  Intimate Partner Violence: Not on file   Review of Systems Sleep disturbed by husband--who needs care (asks for xanax and she needs to get it)    Objective:   Physical Exam Constitutional:       Appearance: Normal appearance.  HENT:     Head:     Comments: No sinus tenderness    Nose:     Comments: Moderate pale nasal congestion    Mouth/Throat:     Pharynx: No oropharyngeal exudate or posterior oropharyngeal erythema.  Pulmonary:     Effort: Pulmonary effort is normal.     Breath sounds: No rales.     Comments: Slight upper airway wheeze but not tight Musculoskeletal:     Cervical back: Neck supple.  Lymphadenopathy:     Cervical: No cervical adenopathy.  Neurological:     Mental Status: She is alert.            Assessment & Plan:

## 2021-03-21 ENCOUNTER — Other Ambulatory Visit: Payer: Self-pay | Admitting: Internal Medicine

## 2021-03-30 ENCOUNTER — Encounter: Payer: Self-pay | Admitting: *Deleted

## 2021-03-30 ENCOUNTER — Other Ambulatory Visit: Payer: Self-pay

## 2021-03-30 ENCOUNTER — Emergency Department: Payer: Medicare PPO

## 2021-03-30 ENCOUNTER — Emergency Department
Admission: EM | Admit: 2021-03-30 | Discharge: 2021-03-30 | Disposition: A | Discharge status: Home / Self Care | Payer: Medicare PPO | Attending: Emergency Medicine | Admitting: Emergency Medicine

## 2021-03-30 DIAGNOSIS — J45901 Unspecified asthma with (acute) exacerbation: Secondary | ICD-10-CM | POA: Diagnosis not present

## 2021-03-30 DIAGNOSIS — Z79899 Other long term (current) drug therapy: Secondary | ICD-10-CM | POA: Diagnosis not present

## 2021-03-30 DIAGNOSIS — I6523 Occlusion and stenosis of bilateral carotid arteries: Secondary | ICD-10-CM | POA: Diagnosis not present

## 2021-03-30 DIAGNOSIS — S0990XA Unspecified injury of head, initial encounter: Secondary | ICD-10-CM | POA: Insufficient documentation

## 2021-03-30 DIAGNOSIS — M4312 Spondylolisthesis, cervical region: Secondary | ICD-10-CM | POA: Diagnosis not present

## 2021-03-30 DIAGNOSIS — Z7982 Long term (current) use of aspirin: Secondary | ICD-10-CM | POA: Diagnosis not present

## 2021-03-30 DIAGNOSIS — Z23 Encounter for immunization: Secondary | ICD-10-CM | POA: Diagnosis not present

## 2021-03-30 DIAGNOSIS — Z1231 Encounter for screening mammogram for malignant neoplasm of breast: Secondary | ICD-10-CM | POA: Diagnosis not present

## 2021-03-30 DIAGNOSIS — Z794 Long term (current) use of insulin: Secondary | ICD-10-CM | POA: Diagnosis not present

## 2021-03-30 DIAGNOSIS — R9082 White matter disease, unspecified: Secondary | ICD-10-CM | POA: Diagnosis not present

## 2021-03-30 DIAGNOSIS — E039 Hypothyroidism, unspecified: Secondary | ICD-10-CM | POA: Insufficient documentation

## 2021-03-30 DIAGNOSIS — S199XXA Unspecified injury of neck, initial encounter: Secondary | ICD-10-CM | POA: Diagnosis not present

## 2021-03-30 DIAGNOSIS — Z7951 Long term (current) use of inhaled steroids: Secondary | ICD-10-CM | POA: Insufficient documentation

## 2021-03-30 DIAGNOSIS — W19XXXA Unspecified fall, initial encounter: Secondary | ICD-10-CM | POA: Diagnosis not present

## 2021-03-30 DIAGNOSIS — M25511 Pain in right shoulder: Secondary | ICD-10-CM | POA: Insufficient documentation

## 2021-03-30 DIAGNOSIS — M47812 Spondylosis without myelopathy or radiculopathy, cervical region: Secondary | ICD-10-CM | POA: Diagnosis not present

## 2021-03-30 DIAGNOSIS — Z7984 Long term (current) use of oral hypoglycemic drugs: Secondary | ICD-10-CM | POA: Diagnosis not present

## 2021-03-30 DIAGNOSIS — W010XXA Fall on same level from slipping, tripping and stumbling without subsequent striking against object, initial encounter: Secondary | ICD-10-CM | POA: Diagnosis not present

## 2021-03-30 DIAGNOSIS — R0902 Hypoxemia: Secondary | ICD-10-CM | POA: Diagnosis not present

## 2021-03-30 DIAGNOSIS — E119 Type 2 diabetes mellitus without complications: Secondary | ICD-10-CM | POA: Diagnosis not present

## 2021-03-30 DIAGNOSIS — J322 Chronic ethmoidal sinusitis: Secondary | ICD-10-CM | POA: Diagnosis not present

## 2021-03-30 DIAGNOSIS — J320 Chronic maxillary sinusitis: Secondary | ICD-10-CM | POA: Diagnosis not present

## 2021-03-30 DIAGNOSIS — R52 Pain, unspecified: Secondary | ICD-10-CM | POA: Diagnosis not present

## 2021-03-30 DIAGNOSIS — M4692 Unspecified inflammatory spondylopathy, cervical region: Secondary | ICD-10-CM | POA: Diagnosis not present

## 2021-03-30 DIAGNOSIS — Z124 Encounter for screening for malignant neoplasm of cervix: Secondary | ICD-10-CM | POA: Diagnosis not present

## 2021-03-30 DIAGNOSIS — R609 Edema, unspecified: Secondary | ICD-10-CM | POA: Diagnosis not present

## 2021-03-30 MED ORDER — TETANUS-DIPHTH-ACELL PERTUSSIS 5-2.5-18.5 LF-MCG/0.5 IM SUSY
0.5000 mL | PREFILLED_SYRINGE | Freq: Once | INTRAMUSCULAR | Status: AC
  Administered 2021-03-30: 0.5 mL via INTRAMUSCULAR
  Filled 2021-03-30: qty 0.5

## 2021-03-30 MED ORDER — LIDOCAINE 5 % EX PTCH: 1.0000 | MEDICATED_PATCH | Freq: Two times a day (BID) | CUTANEOUS | 0 refills | Status: AC

## 2021-03-30 NOTE — ED Provider Notes (Signed)
[facility] Emergency Department Provider Note  ____________________________________________   Event Date/Time   First MD Initiated Contact with Patient 03/30/21 2029     (approximate)  I have reviewed the triage vital signs and the nursing notes.   HISTORY  Chief Complaint Fall    HPI [name] is a 87 y.o. female with diabetes who comes in for mechanical fall.  Patient was brought in from home for mechanical fall where she tripped over a rug and landed on her face and right shoulder.  Patient states that she did fall forward.  Patient reports a little bit of pain on her right shoulder but still able to range it.  States that she just feels like there is a muscle ache in her right side of her neck down into her shoulder, mild, worse with movement, better at rest.  No chest pain or shortness of breath or LOC did not bite her tongue.          Past Medical History:  Diagnosis Date  . Allergy   . Asthma   . Diabetes mellitus   . Diastolic dysfunction    a. 06/2019 Echo: EF 60-65%, no rwma, GR1 DD. Mild MR. Nl RV fxn.  . Essential hypertension   . GERD (gastroesophageal reflux disease)   . Hyperlipidemia   . Mixed hyperlipidemia   . Osteopenia   . PAF (paroxysmal atrial fibrillation) (HCC)    a. Dx in late 1990's - no known recurrence.  . Sleep disorder   . Thyroid disease     Patient Active Problem List   Diagnosis Date Noted  . Allergic asthma 03/19/2021  . Left foot pain 02/18/2020  . Background diabetic retinopathy associated with type 2 diabetes mellitus (HCC) 06/18/2019  . Varicella zoster 12/10/2018  . Exacerbation of asthma 07/27/2018  . Tinea corporis 06/03/2017  . Aortic atherosclerosis (HCC) 04/22/2017  . Advance directive discussed with patient 02/02/2017  . Esophageal dysphagia 10/21/2016  . Mild intermittent asthma with exacerbation 09/08/2014  . Asthma, chronic 10/15/2013  . Routine general medical examination at a  health care facility 03/19/2012  . 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 Episodic mood disorder (HCC) 07/26/2010  . BACK PAIN, LUMBAR, WITH RADICULOPATHY 06/30/2009  . SLEEP DISORDER 11/12/2008  . Actinic keratosis 06/18/2008  . Essential hypertension 02/06/2008  . Hypothyroidism 01/12/2007  . Mixed hyperlipidemia 01/12/2007  . Paroxysmal atrial fibrillation (HCC) 01/12/2007  . Allergic rhinitis 01/12/2007  . GERD 01/12/2007  . OSTEOPENIA 01/12/2007    Past Surgical History:  Procedure Laterality Date  . CERVICAL CONE BIOPSY  1960's  . CHOLECYSTECTOMY    . NASAL POLYP SURGERY  01/01  . STRABISMUS SURGERY  1946 / 5  . VAGINAL DELIVERY     x2    Prior to Admission medications   Medication Sig Start Date End Date Taking? Authorizing Provider  ACCU-CHEK SMARTVIEW test strip TEST BLOOD SUGAR BID UTD 03/18/15   [provider]  albuterol (PROVENTIL HFA;VENTOLIN HFA) 108 (90 Base) MCG/ACT inhaler Inhale 2 puffs into the lungs every 6 (six) hours as needed. 07/27/18   [name], MD  ALPRAZolam Prudy Feeler) 0.25 MG tablet TAKE 1 TABLET(0.25 MG) BY MOUTH TWICE DAILY AS NEEDED FOR ANXIETY 01/19/21   Tillman Abide I, MD  aspirin 81 MG tablet Take 81 mg by mouth daily.    [provider]  atorvastatin (LIPITOR) 20 MG tablet TAKE 1 TABLET BY MOUTH EVERY DAY 05/19/20   Tillman Abide I, MD  B-D ULTRA-FINE 33 LANCETS MISC Use to test blood sugar twice daily dx: E11.9 11/17/14   Tillman Abide I, MD  calcitonin, salmon, (MIACALCIN/FORTICAL) 200 UNIT/ACT nasal spray USE 1 SPRAY NASALLY EVERY DAY( ALTERNATING NOSTRILS) 03/22/21   [name], MD  CALCIUM-VITAMIN D PO Take 1 tablet by mouth daily.    [provider]  cetirizine (ZYRTEC) 1 MG/ML syrup Take 10 mg by mouth daily. Patient not taking: Reported on  03/19/2021    [provider]  Chromium 100 MCG TABS Take by mouth daily.    [provider]  Coenzyme Q10 200 MG capsule Take 200 mg by mouth daily.    [name], MD  fish oil-omega-3 fatty acids 1000 MG capsule Take 1 g by mouth daily.    [provider]  fluticasone (FLONASE) 50 MCG/ACT nasal spray Place 2 sprays into both nostrils daily.    [provider]  folic acid (FOLVITE) 1 MG tablet Take 1 mg by mouth daily.    [provider]  gentamicin ointment (GARAMYCIN) 0.1 % Apply 1 application topically 3 (three) times daily.    [provider]  hydrocortisone 2.5 % cream Apply topically 3 (three) times daily as needed. 12/10/18   [name], MD  insulin glargine (LANTUS) 100 UNIT/ML Solostar Pen 28 Units.  04/30/15   [provider]  Insulin Pen Needle (B-D UF III MINI PEN NEEDLES) 31G X 5 MM MISC USE ONCE TO TWICE DAILY 08/14/20   [name], MD  levothyroxine (SYNTHROID, LEVOTHROID) 100 MCG tablet Take 100 mcg by mouth daily before breakfast.    [provider]  meclizine (ANTIVERT) 25 MG tablet Take 1 tablet (25 mg total) by mouth 3 (three) times daily as needed. 09/30/19   [name], MD  metFORMIN (GLUCOPHAGE-XR) 500 MG 24 hr tablet Take 1 tablet by mouth in the morning and at bedtime. 03/10/20   [provider]  metoprolol succinate (TOPROL-XL) 50 MG 24 hr tablet TAKE 1 TABLET(50 MG) BY MOUTH DAILY 12/07/20   Antonieta Iba, MD  montelukast (SINGULAIR) 10 MG tablet TAKE 1 TABLET BY MOUTH EVERY NIGHT AT BEDTIME 02/20/19   [name], MD  Multiple Vitamin (MULTIVITAMIN) tablet Take 1 tablet by mouth daily.    [provider]  omeprazole (PRILOSEC) 20 MG capsule TAKE 1 CAPSULE BY MOUTH TWICE DAILY BEFORE A MEAL 09/07/20   Tillman Abide I, MD  predniSONE (DELTASONE) 20 MG tablet Take 2 tablets (40 mg total) by mouth daily. For 3 days, then 1 tab daily for 3 days 03/19/21    [name], MD  vitamin B-12 (CYANOCOBALAMIN) 1000 MCG tablet Take 1,000 mcg by mouth daily.    [provider]  vitamin C (ASCORBIC ACID) 500 MG tablet Take 500 mg by mouth daily.    [provider]    Allergies Cefdinir, Ciprofloxacin, Clarithromycin, Miglitol, Niacin, and Qvar [beclomethasone]  Family History  Problem Relation Age of Onset  . Asthma Mother   . Coronary artery disease Father     Social History Social History   Tobacco Use  . Smoking status: Never Smoker  . Smokeless tobacco: Never Used  Vaping Use  . Vaping Use: Never used  Substance Use Topics  . Alcohol use: Yes    Alcohol/week: 1.0 standard drink    Types:  1 Glasses of wine per week    Comment: 1 glass of wine per day  . Drug use: No      Review of Systems Constitutional: No fever/chills, fall, facial swelling Eyes: No visual changes. ENT: No sore throat. Cardiovascular: Denies chest pain. Respiratory: Denies shortness of breath. Gastrointestinal: No abdominal pain.  No nausea, no vomiting.  No diarrhea.  No constipation. Genitourinary: Negative for dysuria. Musculoskeletal: Negative for back pain.  Right arm pain Skin: Negative for rash. Neurological: Negative for headaches, focal weakness or numbness. All other ROS negative ____________________________________________   PHYSICAL EXAM:  VITAL SIGNS: ED Triage Vitals  Enc Vitals Group     BP 03/30/21 2007 (!) 151/84     Pulse Rate 03/30/21 2007 80     Resp 03/30/21 2007 18     Temp 03/30/21 2007 98.5 F (36.9 C)     Temp Source 03/30/21 2007 Oral     SpO2 03/30/21 1938 94 %     Weight 03/30/21 2008 176 lb (79.8 kg)     Height 03/30/21 2008 5\' 3"  (1.6 m)     Head Circumference --      Peak Flow --      Pain Score 03/30/21 2008 0     Pain Loc --      Pain Edu? --      Excl. in GC? --     Constitutional: Alert and oriented. Well appearing and in no acute distress. Eyes: Conjunctivae are normal.  EOMI. Head: Mild abrasions noted with swelling of her upper lip and some mild bruising Nose: No congestion/rhinnorhea. Mouth/Throat: Mucous membranes are moist.   Neck: No stridor. Trachea Midline. FROM Cardiovascular: Normal rate, regular rhythm. Grossly normal heart sounds.  Good peripheral circulation.  No chest wall tenderness Respiratory: Normal respiratory effort.  No retractions. Lungs CTAB. Gastrointestinal: Soft and nontender. No distention. No abdominal bruits.  Musculoskeletal: Mild tenderness on the right shoulder but still able to lift it up above her head.  No other tenderness anywhere else.  No lower extremity tenderness nor edema.  No joint effusions. Neurologic:  Normal speech and language. No gross focal neurologic deficits are appreciated.  Equal strength in arms and legs. Skin:  Skin is warm, dry and intact. No rash noted. Psychiatric: Mood and affect are normal. Speech and behavior are normal. GU: Deferred   ____________________________________________  RADIOLOGY 04/01/21, personally viewed and evaluated these images (plain radiographs) as part of my medical decision making, as well as reviewing the written report by the radiologist.  ED MD interpretation: No fracture  Official radiology report(s): DG Shoulder Right  Result Date: 03/30/2021 CLINICAL DATA:  Recent fall with right shoulder pain, initial encounter EXAM: RIGHT SHOULDER - 2+ VIEW COMPARISON:  None. FINDINGS: Degenerative changes of the acromioclavicular joint are noted. No acute fracture or dislocation is seen. No soft tissue abnormality is noted. IMPRESSION: Degenerative change without acute abnormality. Electronically Signed   By: 04/01/2021 M.D.   On: 03/30/2021 20:25   CT Head Wo Contrast  Result Date: 03/30/2021 CLINICAL DATA:  Fall, striking face and shoulder. EXAM: CT HEAD WITHOUT CONTRAST TECHNIQUE: Contiguous axial images were obtained from the base of the skull through the vertex without  intravenous contrast. COMPARISON:  03/30/2018 FINDINGS: Brain: The brainstem, cerebellum, cerebral peduncles, thalami, basal ganglia, basilar cisterns, and ventricular system appear within normal limits. Periventricular white matter and corona radiata hypodensities favor chronic ischemic microvascular white matter disease. No intracranial hemorrhage, mass lesion, or  acute CVA. Vascular: There is atherosclerotic calcification of the cavernous carotid arteries bilaterally. Skull: Unremarkable Sinuses/Orbits: Prior maxillary antrostomies and nasal cavity surgery. Chronic ethmoid sinusitis. Other: No supplemental non-categorized findings. IMPRESSION: 1. No acute intracranial findings. 2. Periventricular white matter and corona radiata hypodensities favor chronic ischemic microvascular white matter disease. 3. Atherosclerosis. 4. Chronic ethmoid sinusitis. Electronically Signed   By: Gaylyn Rong M.D.   On: 03/30/2021 20:49   CT Cervical Spine Wo Contrast  Result Date: 03/30/2021 CLINICAL DATA:  Fall with facial injury. EXAM: CT CERVICAL SPINE WITHOUT CONTRAST TECHNIQUE: Multidetector CT imaging of the cervical spine was performed without intravenous contrast. Multiplanar CT image reconstructions were also generated. COMPARISON:  MRI cervical spine 03/06/2018 FINDINGS: Alignment: 2 mm of chronic degenerative anterolisthesis at C3-4, not changed from 03/06/2018. Skull base and vertebrae: No fracture or acute bony findings. Soft tissues and spinal canal: Unremarkable Disc levels: Uncinate and facet spurring cause left foraminal stenosis at C3-4 and C4-5, and right foraminal stenosis at C5-6. Substantial degenerative facet arthropathy on the left at C2-3 and C3-4. Upper chest: Unremarkable Other: No supplemental non-categorized findings. IMPRESSION: 1. No acute cervical spine findings. 2. 2 mm chronic degenerative anterolisthesis at C3-4. 3. Spondylosis leading to foraminal impingement at C3-4, C4-5, and C5-6.  Electronically Signed   By: Gaylyn Rong M.D.   On: 03/30/2021 20:56   CT Maxillofacial Wo Contrast  Result Date: 03/30/2021 CLINICAL DATA:  Fall, landing on face. EXAM: CT MAXILLOFACIAL WITHOUT CONTRAST TECHNIQUE: Multidetector CT imaging of the maxillofacial structures was performed. Multiplanar CT image reconstructions were also generated. COMPARISON:  None. FINDINGS: Osseous: No acute facial fracture is identified. Orbits: Unremarkable Sinuses: Bilateral maxillary antrostomies. Chronic ethmoid sinusitis. Mild mucoperiosteal thickening in the maxillary sinuses. Soft tissues: Soft tissue swelling and likely subcutaneous bruising along the right facial tissues for example on image 43 series 2. Limited intracranial: No acute findings. IMPRESSION: 1. Soft tissue swelling and likely subcutaneous bruising along the right facial tissues. No underlying fracture identified. 2. Chronic ethmoid and maxillary sinusitis. Electronically Signed   By: Gaylyn Rong M.D.   On: 03/30/2021 20:52    ____________________________________________   PROCEDURES  Procedure(s) performed (including Critical Care):  Procedures   ____________________________________________   INITIAL IMPRESSION / ASSESSMENT AND PLAN / ED COURSE  Crystalyn P Dimmitt was evaluated in Emergency Department on 03/30/2021 for the symptoms described in the history of present illness. She was evaluated in the context of the global COVID-19 pandemic, which necessitated consideration that the patient might be at risk for infection with the SARS-CoV-2 virus that causes COVID-19. Institutional protocols and algorithms that pertain to the evaluation of patients at risk for COVID-19 are in a state of rapid change based on information released by regulatory bodies including the CDC and federal and state organizations. These policies and algorithms were followed during the patient's care in the ED.    Patient comes in with mechanical fall from  home.  Images were ordered to evaluate for intercranial hemorrhage, cervical fracture, facial fracture, shoulder fracture.  CTs were reassuring.  Some incidental findings found on CT with concerns for possible bleeding foraminal impingement patient does not have symptoms of this.  Patient has no midline C-spine tenderness, no numbness or tingling and full range of motion of neck.  Her pain is more on the right side of her neck.  We will have patient follow this up with her primary care doctor  Patient declines pain medication tetanus was updated  Patient reports some pain  in the right shoulder and upper humerus.  Reviewed the x-ray and there was no fracture.  Patient is able to pull herself up in bed without right arm therefore I have low suspicion for fracture.  Patient feels comfortable with discharge home and will prescribe some lidocaine patches for pain  I discussed the provisional nature of ED diagnosis, the treatment so far, the ongoing plan of care, follow up appointments and return precautions with the patient and any family or support people present. They expressed understanding and agreed with the plan, discharged home.   ____________________________________________   FINAL CLINICAL IMPRESSION(S) / ED DIAGNOSES   Final diagnoses:  Fall, initial encounter  Acute pain of right shoulder  Injury of head, initial encounter      MEDICATIONS GIVEN DURING THIS VISIT:  Medications  Tdap (BOOSTRIX) injection 0.5 mL (0.5 mLs Intramuscular Given 03/30/21 2056)     ED Discharge Orders         Ordered    lidocaine (LIDODERM) 5 %  Every 12 hours        03/30/21 2140           Note:  This document was prepared using Dragon voice recognition software and may include unintentional dictation errors.   Concha SeFunke, Nafeesa Dils E, MD 03/30/21 2141

## 2021-03-30 NOTE — ED Triage Notes (Signed)
Pt to lobby via w/c brought in by EMS from home for mechanical fall; tripped over rug and landed on face and rt shoulder--obvious swelling & bruising to nose, mouth and rt cheek; c-collar in place; A&Ox4, PERRL

## 2021-03-30 NOTE — ED Triage Notes (Signed)
Pt brought in by ems from home.  Pt tripped over the carpet and fell face forward.  Pt has abrasion to left forehead.  No loc.  No vomiting.  No blood thinners.  Pt has c-collar in place.  Pt has neck and right shoulder pain.  Limited rom of right shoulder.  Pt alert  Speech clear.

## 2021-03-30 NOTE — Discharge Instructions (Addendum)
Incidental finding on CT scan could be followed up with primary care doctor.  Take Tylenol 1 g every 8 hours to help with pain and the lidocaine patches.  Return to the ER for worsening confusion or any other concerns    1. No acute cervical spine findings. 2. 2 mm chronic degenerative anterolisthesis at C3-4. 3. Spondylosis leading to foraminal impingement at C3-4, C4-5, and C5-6.

## 2021-03-31 ENCOUNTER — Other Ambulatory Visit: Payer: Self-pay | Admitting: Obstetrics and Gynecology

## 2021-03-31 DIAGNOSIS — Z1231 Encounter for screening mammogram for malignant neoplasm of breast: Secondary | ICD-10-CM

## 2021-04-01 ENCOUNTER — Telehealth: Payer: Self-pay

## 2021-04-01 NOTE — Telephone Encounter (Signed)
Spoke to pt's daughter, [name]. Pt fell 5-17 when she tripped on a rug. Very sore today. Cognitively she is fine. Bruised all over. Left knee is starting to hurt now. Taking tylenol for pain.

## 2021-04-17 ENCOUNTER — Other Ambulatory Visit: Payer: Self-pay | Admitting: Internal Medicine

## 2021-04-19 NOTE — Telephone Encounter (Signed)
Last filled 01-19-21 #60 Last OV 03-19-21 Next OV 09-09-21 [facility] [address]

## 2021-04-30 ENCOUNTER — Emergency Department: Payer: Medicare PPO

## 2021-04-30 ENCOUNTER — Telehealth: Payer: Self-pay

## 2021-04-30 ENCOUNTER — Encounter: Payer: Self-pay | Admitting: Emergency Medicine

## 2021-04-30 ENCOUNTER — Emergency Department
Admission: EM | Admit: 2021-04-30 | Discharge: 2021-05-01 | Disposition: A | Discharge status: Home / Self Care | Payer: Medicare PPO | Attending: Emergency Medicine | Admitting: Emergency Medicine

## 2021-04-30 DIAGNOSIS — E113293 Type 2 diabetes mellitus with mild nonproliferative diabetic retinopathy without macular edema, bilateral: Secondary | ICD-10-CM | POA: Diagnosis not present

## 2021-04-30 DIAGNOSIS — H539 Unspecified visual disturbance: Secondary | ICD-10-CM | POA: Diagnosis not present

## 2021-04-30 DIAGNOSIS — Z7982 Long term (current) use of aspirin: Secondary | ICD-10-CM | POA: Diagnosis not present

## 2021-04-30 DIAGNOSIS — I10 Essential (primary) hypertension: Secondary | ICD-10-CM | POA: Diagnosis not present

## 2021-04-30 DIAGNOSIS — I6782 Cerebral ischemia: Secondary | ICD-10-CM | POA: Diagnosis not present

## 2021-04-30 DIAGNOSIS — E039 Hypothyroidism, unspecified: Secondary | ICD-10-CM | POA: Diagnosis not present

## 2021-04-30 DIAGNOSIS — R42 Dizziness and giddiness: Secondary | ICD-10-CM

## 2021-04-30 DIAGNOSIS — Z7984 Long term (current) use of oral hypoglycemic drugs: Secondary | ICD-10-CM | POA: Insufficient documentation

## 2021-04-30 DIAGNOSIS — Z79899 Other long term (current) drug therapy: Secondary | ICD-10-CM | POA: Diagnosis not present

## 2021-04-30 DIAGNOSIS — J45909 Unspecified asthma, uncomplicated: Secondary | ICD-10-CM | POA: Diagnosis not present

## 2021-04-30 DIAGNOSIS — Z794 Long term (current) use of insulin: Secondary | ICD-10-CM | POA: Insufficient documentation

## 2021-04-30 DIAGNOSIS — G319 Degenerative disease of nervous system, unspecified: Secondary | ICD-10-CM | POA: Diagnosis not present

## 2021-04-30 DIAGNOSIS — H538 Other visual disturbances: Secondary | ICD-10-CM | POA: Diagnosis not present

## 2021-04-30 LAB — BASIC METABOLIC PANEL
Anion gap: 7 (ref 5–15)
BUN: 17 mg/dL (ref 8–23)
CO2: 25 mmol/L (ref 22–32)
Calcium: 9 mg/dL (ref 8.9–10.3)
Chloride: 99 mmol/L (ref 98–111)
Creatinine, Ser: 0.66 mg/dL (ref 0.44–1.00)
GFR, Estimated: >60 mL/min (ref >60)
Glucose, Bld: 119 mg/dL — ABNORMAL HIGH (ref 70–99)
Potassium: 4.4 mmol/L (ref 3.5–5.1)
Sodium: 131 mmol/L — ABNORMAL LOW (ref 135–145)

## 2021-04-30 LAB — CBC
HCT: 36.7 % (ref 36.0–46.0)
Hemoglobin: 12.6 g/dL (ref 12.0–15.0)
MCH: 29.4 pg (ref 26.0–34.0)
MCHC: 34.3 g/dL (ref 30.0–36.0)
MCV: 85.7 fL (ref 80.0–100.0)
Platelets: 243 10*3/uL (ref 150–400)
RBC: 4.28 MIL/uL (ref 3.87–5.11)
RDW: 13.2 % (ref 11.5–15.5)
WBC: 8.1 10*3/uL (ref 4.0–10.5)
nRBC: 0 % (ref 0.0–0.2)

## 2021-04-30 LAB — TROPONIN I (HIGH SENSITIVITY): Troponin I (High Sensitivity): 3 ng/L (ref <18)

## 2021-04-30 NOTE — ED Notes (Signed)
Pt with daughter. Pt states her left eye is blurred vision and started yesterday. Pt states previous surgeries to the eye with the eye lid normally drooping some. Left pupil is smaller compared to right. Pt states this is normal. BP 221/86 and set to take again in 30 minutes. Pt states it should be normal at that  Point. Pt states history of high blood pressure

## 2021-04-30 NOTE — ED Triage Notes (Signed)
Pt with daughter who reports pt had blurred vision in the left eye as well as difficulty focusing and felt "fuzzy." Pt reports she has natural droop to the left eye as well as vision issues. Pt in triage still having blurred vision and trouble focusing. Pt has bilateral grip strength, no facial droop from baseline, no drift, able to answer all questions.

## 2021-04-30 NOTE — Telephone Encounter (Signed)
[name] called triage stating pt is having blurred vision and feeling off balance. She fell last month and hit her head and face and went to the ER. She had CT Scans of Brain w/o contrast, maxillofacial, and C-Spine. I called back and spoke to [name] and pt. I agree with [name] that she go to the ER for further testing to make sure she does not have a bleed. They will go tonight.

## 2021-05-01 NOTE — ED Notes (Signed)
Provider messaged via secure chat of BP 213/71

## 2021-05-01 NOTE — ED Provider Notes (Signed)
[facility] Emergency Department Provider Note   ____________________________________________   Event Date/Time   First MD Initiated Contact with Patient 04/30/21 2324     (approximate)  I have reviewed the triage vital signs and the nursing notes.   HISTORY  Chief Complaint Blurred Vision    HPI [name] is a 87 y.o. female with below stated past medical history presents for an episode of blurred vision with associated lightheadedness that occurred approximately 1.5 hours prior to arrival and has resolved since onset.  Patient states he has never had symptoms similar to this in the past but has had issues controlling her blood pressure recently.  Patient states she has had episodic lightheadedness in the past but has never had visual changes.  Patient also complains of intermittent left posterior headache over the past week that she has attributed to her normal neck pain.  Patient denies any ringing in the ears, headache at the time, slurred speech, facial droop, or weakness/numbness/paresthesias in any extremity.         Past Medical History:  Diagnosis Date   Allergy    Asthma    Diabetes mellitus    Diastolic dysfunction    a. 06/2019 Echo: EF 60-65%, no rwma, GR1 DD. Mild MR. Nl RV fxn.   Essential hypertension    GERD (gastroesophageal reflux disease)    Hyperlipidemia    Mixed hyperlipidemia    Osteopenia    PAF (paroxysmal atrial fibrillation) (HCC)    a. Dx in late 1990's - no known recurrence.   Sleep disorder    Thyroid disease     Patient Active Problem List   Diagnosis Date Noted   Allergic asthma 03/19/2021   Left foot pain 02/18/2020   Background diabetic retinopathy associated with type 2 diabetes mellitus (HCC) 06/18/2019   Varicella zoster 12/10/2018   Exacerbation of asthma 07/27/2018   Tinea corporis 06/03/2017   Aortic atherosclerosis (HCC) 04/22/2017   Advance directive discussed with patient 02/02/2017    Esophageal dysphagia 10/21/2016   Mild intermittent asthma with exacerbation 09/08/2014   Asthma, chronic 10/15/2013   Routine general medical examination at a health care facility 03/19/2012   Diabetes mellitus due to underlying condition, controlled, with both eyes affected by mild nonproliferative retinopathy without macular edema, with long-term current use of insulin (HCC) 01/16/2012   Episodic mood disorder (HCC) 07/26/2010   BACK PAIN, LUMBAR, WITH RADICULOPATHY 06/30/2009   SLEEP DISORDER 11/12/2008   Actinic keratosis 06/18/2008   Essential hypertension 02/06/2008   Hypothyroidism 01/12/2007   Mixed hyperlipidemia 01/12/2007   Paroxysmal atrial fibrillation (HCC) 01/12/2007   Allergic rhinitis 01/12/2007   GERD 01/12/2007   OSTEOPENIA 01/12/2007    Past Surgical History:  Procedure Laterality Date   CERVICAL CONE BIOPSY  1960's   CHOLECYSTECTOMY     NASAL POLYP SURGERY  01/01   STRABISMUS SURGERY  1946 / 1967   VAGINAL DELIVERY     x2    Prior to Admission medications   Medication Sig Start Date End Date Taking? Authorizing Provider  ACCU-CHEK SMARTVIEW test strip TEST BLOOD SUGAR BID UTD 03/18/15   [provider]  albuterol (PROVENTIL HFA;VENTOLIN HFA) 108 (90 Base) MCG/ACT inhaler Inhale 2 puffs into the lungs every 6 (six) hours as needed. 07/27/18   [name], MD  ALPRAZolam Prudy Feeler) 0.25 MG tablet TAKE 1 TABLET(0.25 MG) BY MOUTH TWICE DAILY AS NEEDED FOR ANXIETY 04/19/21   Worthy Rancher B, FNP  aspirin 81 MG tablet  Take 81 mg by mouth daily.    [provider]  atorvastatin (LIPITOR) 20 MG tablet TAKE 1 TABLET BY MOUTH EVERY DAY 05/19/20   Tillman Abide I, MD  B-D ULTRA-FINE 33 LANCETS MISC Use to test blood sugar twice daily dx: E11.9 11/17/14   Tillman Abide I, MD  calcitonin, salmon, (MIACALCIN/FORTICAL) 200 UNIT/ACT nasal spray USE 1 SPRAY NASALLY EVERY DAY( ALTERNATING NOSTRILS) 03/22/21   [name], MD  CALCIUM-VITAMIN D PO Take 1  tablet by mouth daily.    [provider]  cetirizine (ZYRTEC) 1 MG/ML syrup Take 10 mg by mouth daily. Patient not taking: Reported on 03/19/2021    [provider]  Chromium 100 MCG TABS Take by mouth daily.    [provider]  Coenzyme Q10 200 MG capsule Take 200 mg by mouth daily.    [name], MD  fish oil-omega-3 fatty acids 1000 MG capsule Take 1 g by mouth daily.    [provider]  fluticasone (FLONASE) 50 MCG/ACT nasal spray Place 2 sprays into both nostrils daily.    [provider]  folic acid (FOLVITE) 1 MG tablet Take 1 mg by mouth daily.    [provider]  gentamicin ointment (GARAMYCIN) 0.1 % Apply 1 application topically 3 (three) times daily.    [provider]  hydrocortisone 2.5 % cream Apply topically 3 (three) times daily as needed. 12/10/18   [name], MD  insulin glargine (LANTUS) 100 UNIT/ML Solostar Pen 28 Units.  04/30/15   [provider]  Insulin Pen Needle (B-D UF III MINI PEN NEEDLES) 31G X 5 MM MISC USE ONCE TO TWICE DAILY 08/14/20   [name], MD  levothyroxine (SYNTHROID, LEVOTHROID) 100 MCG tablet Take 100 mcg by mouth daily before breakfast.    [provider]  meclizine (ANTIVERT) 25 MG tablet Take 1 tablet (25 mg total) by mouth 3 (three) times daily as needed. 09/30/19   [name], MD  metFORMIN (GLUCOPHAGE-XR) 500 MG 24 hr tablet Take 1 tablet by mouth in the morning and at bedtime. 03/10/20   [provider]  metoprolol succinate (TOPROL-XL) 50 MG 24 hr tablet TAKE 1 TABLET(50 MG) BY MOUTH DAILY 12/07/20   Antonieta Iba, MD  montelukast (SINGULAIR) 10 MG tablet TAKE 1 TABLET BY MOUTH EVERY NIGHT AT BEDTIME 02/20/19   [name], MD  Multiple Vitamin (MULTIVITAMIN) tablet Take 1 tablet by mouth daily.    [provider]  omeprazole (PRILOSEC) 20 MG capsule TAKE 1 CAPSULE BY MOUTH TWICE DAILY BEFORE A MEAL 09/07/20   Tillman Abide I, MD  predniSONE (DELTASONE) 20 MG tablet Take 2 tablets (40 mg total) by mouth daily. For 3 days, then 1 tab daily for 3 days 03/19/21   [name], MD  vitamin B-12 (CYANOCOBALAMIN) 1000 MCG tablet Take 1,000 mcg by mouth daily.    [provider]  vitamin C (ASCORBIC ACID) 500 MG tablet Take 500 mg by mouth daily.    [provider]    Allergies Cefdinir, Ciprofloxacin, Clarithromycin, Miglitol, Niacin, and Qvar [beclomethasone]  Family History  Problem Relation Age of Onset   Asthma Mother    Coronary artery disease Father     Social History Social History   Tobacco Use   Smoking status: Never   Smokeless tobacco: Never  Vaping Use   Vaping Use: Never used  Substance Use Topics   Alcohol use: Yes  Alcohol/week: 1.0 standard drink    Types: 1 Glasses of wine per week    Comment: 1 glass of wine per day   Drug use: No    Review of Systems Constitutional: No fever/chills Eyes: Endorses 1 episode of blurred vision ENT: No sore throat. Cardiovascular: Denies chest pain. Respiratory: Denies shortness of breath. Gastrointestinal: No abdominal pain.  No nausea, no vomiting.  No diarrhea. Genitourinary: Negative for dysuria. Musculoskeletal: Negative for acute arthralgias Skin: Negative for rash. Neurological: Endorses left posterior headaches, denies any weakness/numbness/paresthesias in any extremity Psychiatric: Negative for suicidal ideation/homicidal ideation   ____________________________________________   PHYSICAL EXAM:  VITAL SIGNS: ED Triage Vitals  Enc Vitals Group     BP 04/30/21 1750 105/88     Pulse Rate 04/30/21 1750 84     Resp 04/30/21 1750 16     Temp 04/30/21 1750 98.4 F (36.9 C)     Temp Source 04/30/21 1750 Oral     SpO2 04/30/21 1750 97 %     Weight --      Height --      Head Circumference --      Peak Flow --      Pain Score 04/30/21 2335 0     Pain Loc --      Pain Edu? --      Excl. in GC? --     Constitutional: Alert and oriented. Well appearing and in no acute distress. Eyes: Conjunctivae are normal. PERRL. Head: Atraumatic. Nose: No congestion/rhinnorhea. Mouth/Throat: Mucous membranes are moist. Neck: No stridor Cardiovascular: Grossly normal heart sounds.  Good peripheral circulation. Respiratory: Normal respiratory effort.  No retractions. Gastrointestinal: Soft and nontender. No distention. Musculoskeletal: No obvious deformities Neurologic:  Normal speech and language. No gross focal neurologic deficits are appreciated. Skin:  Skin is warm and dry. No rash noted. Psychiatric: Mood and affect are normal. Speech and behavior are normal.  ____________________________________________   LABS (all labs ordered are listed, but only abnormal results are displayed)  Labs Reviewed  BASIC METABOLIC PANEL - Abnormal; Notable for the following components:      Result Value   Sodium 131 (*)    Glucose, Bld 119 (*)    All other components within normal limits  CBC  URINALYSIS, COMPLETE (UACMP) WITH MICROSCOPIC  CBG MONITORING, ED  TROPONIN I (HIGH SENSITIVITY)   ____________________________________________  EKG  ED ECG REPORT I, Merwyn KatosEvan K Kensley Lares, the attending physician, personally viewed and interpreted this ECG.  Date: 05/01/2021 EKG Time: 1754 Rate: 80 Rhythm: normal sinus rhythm QRS Axis: normal Intervals: normal ST/T Wave abnormalities: normal Narrative Interpretation: no evidence of acute ischemia  ____________________________________________  RADIOLOGY  ED MD interpretation: CT of the head without contrast shows chronic atrophic and ischemic changes without any evidence of acute abnormalities  Official radiology report(s): CT Head Wo Contrast  Result Date: 04/30/2021 CLINICAL DATA:  Blurry vision in the left eye EXAM: CT HEAD WITHOUT CONTRAST TECHNIQUE: Contiguous axial images were obtained from the base of the skull through the vertex without  intravenous contrast. COMPARISON:  03/30/2021 FINDINGS: Brain: No evidence of acute infarction, hemorrhage, hydrocephalus, extra-axial collection or mass lesion/mass effect. Mild atrophic changes and chronic white matter ischemic changes are again noted and stable. Vascular: No hyperdense vessel or unexpected calcification. Skull: Normal. Negative for fracture or focal lesion. Sinuses/Orbits: No acute finding. Other: None. IMPRESSION: Chronic atrophic and ischemic changes without acute abnormality. Electronically Signed   By: Alcide CleverMark  Lukens M.D.   On: 04/30/2021 18:39  ____________________________________________   PROCEDURES  Procedure(s) performed (including Critical Care):  .1-3 Lead EKG Interpretation  Date/Time: 05/01/2021 1:02 AM Performed by: Merwyn Katos, MD Authorized by: Merwyn Katos, MD     Interpretation: normal     ECG rate:  68   ECG rate assessment: normal     Rhythm: sinus rhythm     Ectopy: none     Conduction: normal     ____________________________________________   INITIAL IMPRESSION / ASSESSMENT AND PLAN / ED COURSE  As part of my medical decision making, I reviewed the following data within the electronic MEDICAL RECORD NUMBER Nursing notes reviewed and incorporated, Labs reviewed, EKG interpreted, Old chart reviewed, Radiograph reviewed and Notes from prior ED visits reviewed and incorporated      Based on History, Exam, and Findings, presentation not consistent with syncope, seizure, stroke, meningitis, symptomatic anemia (gastrointestinal bleed), Increased ICP (cerebral tumor/mass), ICH. Additionally, I have a low suspicion for AOM, labyrinthitis, or other infectious process.  Reassessment: Prior to discharge symptoms controlled, patient well appearing. Disposition:  Discharge. Strict return precautions discussed w/ full understanding. Advise follow up with primary care provider within 24-48 hours.       ____________________________________________   FINAL CLINICAL IMPRESSION(S) / ED DIAGNOSES  Final diagnoses:  Vision changes  Episodic lightheadedness     ED Discharge Orders     None        Note:  This document was prepared using Dragon voice recognition software and may include unintentional dictation errors.    Merwyn Katos, MD 05/01/21 [phone]

## 2021-05-01 NOTE — Telephone Encounter (Signed)
Seen in ER and the symptoms were better. CT negative Please check on her on Monday

## 2021-05-03 NOTE — Telephone Encounter (Signed)
Spoke to pt. She said she is doing better. The ER explained that it was most likely a spatial issue. She had been inside the house for several weeks then went to [facility] and it was so open. Caused her to feel dizzy.

## 2021-05-11 ENCOUNTER — Other Ambulatory Visit: Payer: Self-pay

## 2021-05-11 ENCOUNTER — Other Ambulatory Visit: Payer: Self-pay | Admitting: Internal Medicine

## 2021-05-11 ENCOUNTER — Encounter: Payer: Self-pay | Admitting: Internal Medicine

## 2021-05-11 ENCOUNTER — Ambulatory Visit: Payer: Medicare PPO | Admitting: Internal Medicine

## 2021-05-11 VITALS — BP 112/72 | HR 79 | Temp 97.4°F | Ht 63.0 in | Wt 175.0 lb

## 2021-05-11 DIAGNOSIS — S46811A Strain of other muscles, fascia and tendons at shoulder and upper arm level, right arm, initial encounter: Secondary | ICD-10-CM | POA: Diagnosis not present

## 2021-05-11 MED ORDER — ALBUTEROL SULFATE HFA 108 (90 BASE) MCG/ACT IN AERS: 2.0000 | INHALATION_SPRAY | Freq: Four times a day (QID) | RESPIRATORY_TRACT | 1 refills | Status: DC | PRN

## 2021-05-11 MED ORDER — FLUTICASONE PROPIONATE 50 MCG/ACT NA SUSP: 2.0000 | Freq: Every day | NASAL | 11 refills | Status: DC

## 2021-05-11 NOTE — Patient Instructions (Signed)
Please try heat on the painful muscle and you can also try over the counter diclofenac gel 2-3 times a day.

## 2021-05-11 NOTE — Progress Notes (Signed)
Subjective:    Patient ID: [name], female    DOB: [birth year], 87 y.o.   MRN: [id number]  HPI Here with daughter due to persistent right arm pain since fall last month This visit occurred during the SARS-CoV-2 public health emergency.  Safety protocols were in place, including screening questions prior to the visit, additional usage of staff PPE, and extensive cleaning of exam room while observing appropriate contact time as indicated for disinfecting solutions.   1 month ago--came into the house and had to navigate around house to get to turn off alarm Rushing and fell into table---hit cheek and broke glasses Hit right knee Doesn't remember hitting shoulder Had sig bruising under nose and swollen lip Right side of face contused as well---and black eyes Went to ER--by then--the shoulder was the worst pain Also with bruising on entire right hand  Still having pain in right shoulder Also in posterior neck Points along trapezius with pain area---then down arm into hand (had been painful even before the fall) Bruise on upper right arm--this is better Some pain by anterior shoulder also (at clavicle)  Using tylenol regularly--it helps  Current Outpatient Medications on File Prior to Visit  Medication Sig Dispense Refill  . ACCU-CHEK SMARTVIEW test strip TEST BLOOD SUGAR BID UTD  3  . albuterol (PROVENTIL HFA;VENTOLIN HFA) 108 (90 Base) MCG/ACT inhaler Inhale 2 puffs into the lungs every 6 (six) hours as needed. 18 g 1  . ALPRAZolam (XANAX) 0.25 MG tablet TAKE 1 TABLET(0.25 MG) BY MOUTH TWICE DAILY AS NEEDED FOR ANXIETY 60 tablet 0  . aspirin 81 MG tablet Take 81 mg by mouth daily.    Marland Kitchen atorvastatin (LIPITOR) 20 MG tablet TAKE 1 TABLET BY MOUTH EVERY DAY 90 tablet 3  . B-D ULTRA-FINE 33 LANCETS MISC Use to test blood sugar twice daily dx: E11.9 200 each 3  . calcitonin, salmon, (MIACALCIN/FORTICAL) 200 UNIT/ACT nasal spray USE 1 SPRAY NASALLY EVERY DAY( ALTERNATING NOSTRILS) 3.7  mL 11  . CALCIUM-VITAMIN D PO Take 1 tablet by mouth daily.    . cetirizine (ZYRTEC) 1 MG/ML syrup Take 10 mg by mouth daily.    . Chromium 100 MCG TABS Take by mouth daily.    . Coenzyme Q10 200 MG capsule Take 200 mg by mouth daily.    . fish oil-omega-3 fatty acids 1000 MG capsule Take 1 g by mouth daily.    . fluticasone (FLONASE) 50 MCG/ACT nasal spray Place 2 sprays into both nostrils daily.    . folic acid (FOLVITE) 1 MG tablet Take 1 mg by mouth daily.    Marland Kitchen gentamicin ointment (GARAMYCIN) 0.1 % Apply 1 application topically 3 (three) times daily.    . hydrocortisone 2.5 % cream Apply topically 3 (three) times daily as needed. 28 g 3  . insulin glargine (LANTUS) 100 UNIT/ML Solostar Pen 28 Units.     . Insulin Pen Needle (B-D UF III MINI PEN NEEDLES) 31G X 5 MM MISC USE ONCE TO TWICE DAILY 200 each 4  . levothyroxine (SYNTHROID, LEVOTHROID) 100 MCG tablet Take 100 mcg by mouth daily before breakfast.    . meclizine (ANTIVERT) 25 MG tablet Take 1 tablet (25 mg total) by mouth 3 (three) times daily as needed. 90 tablet 0  . metFORMIN (GLUCOPHAGE-XR) 500 MG 24 hr tablet Take 1 tablet by mouth in the morning and at bedtime.    . metoprolol succinate (TOPROL-XL) 50 MG 24 hr tablet TAKE 1 TABLET(50 MG) BY MOUTH  DAILY 90 tablet 3  . montelukast (SINGULAIR) 10 MG tablet TAKE 1 TABLET BY MOUTH EVERY NIGHT AT BEDTIME 90 tablet 3  . Multiple Vitamin (MULTIVITAMIN) tablet Take 1 tablet by mouth daily.    Marland Kitchen omeprazole (PRILOSEC) 20 MG capsule TAKE 1 CAPSULE BY MOUTH TWICE DAILY BEFORE A MEAL 180 capsule 3  . vitamin B-12 (CYANOCOBALAMIN) 1000 MCG tablet Take 1,000 mcg by mouth daily.    . vitamin C (ASCORBIC ACID) 500 MG tablet Take 500 mg by mouth daily.     No current facility-administered medications on file prior to visit.    Allergies  Allergen Reactions  . Cefdinir     REACTION: unspecified  . Ciprofloxacin     rash  . Clarithromycin     REACTION: weird dreams (on prednisone at the  same time but has tolerated this in the past)  . Miglitol     REACTION: unspecified  . Niacin     REACTION: unspecified  . Qvar [Beclomethasone] Other (See Comments)    Feels funny Feels funny    Past Medical History:  Diagnosis Date  . Allergy   . Asthma   . Diabetes mellitus   . Diastolic dysfunction    a. 06/2019 Echo: EF 60-65%, no rwma, GR1 DD. Mild MR. Nl RV fxn.  . Essential hypertension   . GERD (gastroesophageal reflux disease)   . Hyperlipidemia   . Mixed hyperlipidemia   . Osteopenia   . PAF (paroxysmal atrial fibrillation) (HCC)    a. Dx in late 1990's - no known recurrence.  . Sleep disorder   . Thyroid disease     Past Surgical History:  Procedure Laterality Date  . CERVICAL CONE BIOPSY  1960's  . CHOLECYSTECTOMY    . NASAL POLYP SURGERY  01/01  . STRABISMUS SURGERY  1946 / 73  . VAGINAL DELIVERY     x2    Family History  Problem Relation Age of Onset  . Asthma Mother   . Coronary artery disease Father     Social History   Socioeconomic History  . Marital status: Married    Spouse name: Not on file  . Number of children: 2  . Years of education: Not on file  . Highest education level: Not on file  Occupational History    Employer: RETIRED  Tobacco Use  . Smoking status: Never  . Smokeless tobacco: Never  Vaping Use  . Vaping Use: Never used  Substance and Sexual Activity  . Alcohol use: Yes    Alcohol/week: 1.0 standard drink    Types: 1 Glasses of wine per week    Comment: 1 glass of wine per day  . Drug use: No  . Sexual activity: Not Currently  Other Topics Concern  . Not on file  Social History Narrative   Has living will   Not sure about formal POA --but requests husband, then daughter [name] (and son [name]) to be health care POAs   Would accept resuscitation attempts   Not sure about feeding tubes   Social Determinants of Health   Financial Resource Strain: Not on file  Food Insecurity: Not on file  Transportation  Needs: Not on file  Physical Activity: Not on file  Stress: Not on file  Social Connections: Not on file  Intimate Partner Violence: Not on file  'Review of Systems Using zyrtec in AM and singulair at night for allergies May have been foggy in the day from zyrtec--discussed trying the zyrtec at night (  and is considering trying the lower dose)    Objective:   Physical Exam Neck:     Comments: Tenderness along right trapezius Musculoskeletal:     Cervical back: Normal range of motion.     Comments: Slight right shoulder bursa tenderness Normal abduction Mild pain with internal rotation Only slight restriction in external rotation           Assessment & Plan:

## 2021-05-11 NOTE — Assessment & Plan Note (Signed)
Had mild bruising along lateral upper arm--but shoulder seems okay Discussed heat for the trapezius and tylenol prn Can try diclofenac topical

## 2021-05-12 ENCOUNTER — Other Ambulatory Visit: Payer: Self-pay | Admitting: Internal Medicine

## 2021-05-13 DIAGNOSIS — E1159 Type 2 diabetes mellitus with other circulatory complications: Secondary | ICD-10-CM | POA: Diagnosis not present

## 2021-05-13 DIAGNOSIS — E063 Autoimmune thyroiditis: Secondary | ICD-10-CM | POA: Diagnosis not present

## 2021-05-13 DIAGNOSIS — E1169 Type 2 diabetes mellitus with other specified complication: Secondary | ICD-10-CM | POA: Diagnosis not present

## 2021-05-13 DIAGNOSIS — E1165 Type 2 diabetes mellitus with hyperglycemia: Secondary | ICD-10-CM | POA: Diagnosis not present

## 2021-05-13 DIAGNOSIS — E669 Obesity, unspecified: Secondary | ICD-10-CM | POA: Diagnosis not present

## 2021-05-13 DIAGNOSIS — E785 Hyperlipidemia, unspecified: Secondary | ICD-10-CM | POA: Diagnosis not present

## 2021-05-13 DIAGNOSIS — I152 Hypertension secondary to endocrine disorders: Secondary | ICD-10-CM | POA: Diagnosis not present

## 2021-05-13 DIAGNOSIS — Z794 Long term (current) use of insulin: Secondary | ICD-10-CM | POA: Diagnosis not present

## 2021-05-18 DIAGNOSIS — H903 Sensorineural hearing loss, bilateral: Secondary | ICD-10-CM | POA: Diagnosis not present

## 2021-05-18 DIAGNOSIS — H6123 Impacted cerumen, bilateral: Secondary | ICD-10-CM | POA: Diagnosis not present

## 2021-06-07 ENCOUNTER — Other Ambulatory Visit: Payer: Self-pay

## 2021-06-07 ENCOUNTER — Ambulatory Visit
Admission: RE | Admit: 2021-06-07 | Discharge: 2021-06-07 | Disposition: A | Discharge status: Home / Self Care | Payer: Medicare PPO | Source: Ambulatory Visit | Attending: Obstetrics and Gynecology | Admitting: Obstetrics and Gynecology

## 2021-06-07 DIAGNOSIS — Z1231 Encounter for screening mammogram for malignant neoplasm of breast: Secondary | ICD-10-CM

## 2021-08-01 ENCOUNTER — Other Ambulatory Visit: Payer: Self-pay | Admitting: Family

## 2021-08-12 NOTE — Telephone Encounter (Signed)
Pt called to check status on this refill. Please advise.

## 2021-08-12 NOTE — Telephone Encounter (Signed)
This rx was forwarded to Worthy Rancher as she was the last person to refill it in Dr Karle Starch absence. Someone did not look at the PCP and send it to them.  Last filled 04-19-21 #60 Last OV 05-11-21 Next OV 09-09-21 [facility]

## 2021-08-19 DIAGNOSIS — H6123 Impacted cerumen, bilateral: Secondary | ICD-10-CM | POA: Diagnosis not present

## 2021-08-19 DIAGNOSIS — H903 Sensorineural hearing loss, bilateral: Secondary | ICD-10-CM | POA: Diagnosis not present

## 2021-09-09 ENCOUNTER — Encounter: Payer: Self-pay | Admitting: Internal Medicine

## 2021-09-09 ENCOUNTER — Other Ambulatory Visit: Payer: Self-pay

## 2021-09-09 ENCOUNTER — Ambulatory Visit (INDEPENDENT_AMBULATORY_CARE_PROVIDER_SITE_OTHER): Payer: Medicare PPO | Admitting: Internal Medicine

## 2021-09-09 VITALS — BP 132/78 | HR 68 | Temp 97.0°F | Ht 63.0 in | Wt 177.0 lb

## 2021-09-09 DIAGNOSIS — I10 Essential (primary) hypertension: Secondary | ICD-10-CM | POA: Diagnosis not present

## 2021-09-09 DIAGNOSIS — I700 Atherosclerosis of aorta: Secondary | ICD-10-CM

## 2021-09-09 DIAGNOSIS — I480 Paroxysmal atrial fibrillation: Secondary | ICD-10-CM

## 2021-09-09 DIAGNOSIS — Z23 Encounter for immunization: Secondary | ICD-10-CM | POA: Diagnosis not present

## 2021-09-09 DIAGNOSIS — E083293 Diabetes mellitus due to underlying condition with mild nonproliferative diabetic retinopathy without macular edema, bilateral: Secondary | ICD-10-CM

## 2021-09-09 DIAGNOSIS — F39 Unspecified mood [affective] disorder: Secondary | ICD-10-CM

## 2021-09-09 DIAGNOSIS — Z794 Long term (current) use of insulin: Secondary | ICD-10-CM | POA: Diagnosis not present

## 2021-09-09 DIAGNOSIS — J4520 Mild intermittent asthma, uncomplicated: Secondary | ICD-10-CM | POA: Diagnosis not present

## 2021-09-09 DIAGNOSIS — E039 Hypothyroidism, unspecified: Secondary | ICD-10-CM

## 2021-09-09 DIAGNOSIS — Z0000 Encounter for general adult medical examination without abnormal findings: Secondary | ICD-10-CM

## 2021-09-09 NOTE — Assessment & Plan Note (Signed)
BP Readings from Last 3 Encounters:  09/09/21 132/78  05/11/21 112/72  05/01/21 (!) 154/68   Good ocontrol on metoprolol

## 2021-09-09 NOTE — Assessment & Plan Note (Signed)
Seems euthyroid Will recheck labs 

## 2021-09-09 NOTE — Assessment & Plan Note (Signed)
On imaging Is on asa and atorvastatin

## 2021-09-09 NOTE — Assessment & Plan Note (Signed)
Good control on metformin and insulin Last 7.7%

## 2021-09-09 NOTE — Assessment & Plan Note (Signed)
Quiet Has ventolin for prn Using zyrtec

## 2021-09-09 NOTE — Progress Notes (Signed)
Subjective:    Patient ID: [name], female    DOB: [birth year], 87 y.o.   MRN: [id number]  HPI Here with daughter [name] for Medicare wellness visit and follow up of chronic health conditions This visit occurred during the SARS-CoV-2 public health emergency.  Safety protocols were in place, including screening questions prior to the visit, additional usage of staff PPE, and extensive cleaning of exam room while observing appropriate contact time as indicated for disinfecting solutions.   Reviewed advanced directives Reviewed other doctors---Dr Blackwood--endocrine (she left and will be getting new doctor), Dr Gollan--cardiology No hospitalizations or surgery. Went to ER for a fall--facial and arm injuries No other falls Will have some scotch along with her husband No tobacco No exercise--discussed Vision is okay---fading some Hearing is not good--does have hearing aides which help Daughter does most of the instrumental ADLs---she does cook/laundry at times Memory is okay  Daughter thinks she may have some depression--limited in travel, etc Remembers brother who died in [place] in 1942---relieving some of this Some anxiety--uses the alprazolam Not anhedonic  Some varied joint problems Shoulders/back, etc Tylenol helps some  No chest pain No SOB No coughing or wheezing No palpitations---or rarely Just on ASA  Checks sugars bid Average around 130's No foot numbness or burning  Current Outpatient Medications on File Prior to Visit  Medication Sig Dispense Refill   ACCU-CHEK SMARTVIEW test strip TEST BLOOD SUGAR BID UTD  3   albuterol (VENTOLIN HFA) 108 (90 Base) MCG/ACT inhaler Inhale 2 puffs into the lungs every 6 (six) hours as needed. 18 g 1   ALPRAZolam (XANAX) 0.25 MG tablet TAKE 1 TABLET(0.25 MG) BY MOUTH TWICE DAILY AS NEEDED FOR ANXIETY 60 tablet 0   aspirin 81 MG tablet Take 81 mg by mouth daily.     atorvastatin (LIPITOR) 20 MG tablet TAKE 1 TABLET BY MOUTH  EVERY DAY 90 tablet 3   B-D ULTRA-FINE 33 LANCETS MISC Use to test blood sugar twice daily dx: E11.9 200 each 3   calcitonin, salmon, (MIACALCIN/FORTICAL) 200 UNIT/ACT nasal spray USE 1 SPRAY NASALLY EVERY DAY( ALTERNATING NOSTRILS) 3.7 mL 11   CALCIUM-VITAMIN D PO Take 1 tablet by mouth daily.     cetirizine (ZYRTEC) 1 MG/ML syrup Take 10 mg by mouth daily.     Chromium 100 MCG TABS Take by mouth daily.     Coenzyme Q10 200 MG capsule Take 200 mg by mouth daily.     fish oil-omega-3 fatty acids 1000 MG capsule Take 1 g by mouth daily.     fluticasone (FLONASE) 50 MCG/ACT nasal spray Place 2 sprays into both nostrils daily. 16 g 11   folic acid (FOLVITE) 1 MG tablet Take 1 mg by mouth daily.     insulin glargine (LANTUS) 100 UNIT/ML Solostar Pen 28 Units.      Insulin Pen Needle (B-D UF III MINI PEN NEEDLES) 31G X 5 MM MISC USE ONCE TO TWICE DAILY 200 each 4   levothyroxine (SYNTHROID, LEVOTHROID) 100 MCG tablet Take 100 mcg by mouth daily before breakfast.     meclizine (ANTIVERT) 25 MG tablet Take 1 tablet (25 mg total) by mouth 3 (three) times daily as needed. 90 tablet 0   metFORMIN (GLUCOPHAGE-XR) 500 MG 24 hr tablet Take 1 tablet by mouth in the morning and at bedtime.     metoprolol succinate (TOPROL-XL) 50 MG 24 hr tablet TAKE 1 TABLET(50 MG) BY MOUTH DAILY 90 tablet 3   montelukast (SINGULAIR) 10  MG tablet TAKE 1 TABLET BY MOUTH EVERY NIGHT AT BEDTIME 90 tablet 3   Multiple Vitamin (MULTIVITAMIN) tablet Take 1 tablet by mouth daily.     omeprazole (PRILOSEC) 20 MG capsule TAKE 1 CAPSULE BY MOUTH TWICE DAILY BEFORE A MEAL 180 capsule 3   vitamin B-12 (CYANOCOBALAMIN) 1000 MCG tablet Take 1,000 mcg by mouth daily.     vitamin C (ASCORBIC ACID) 500 MG tablet Take 500 mg by mouth daily.     No current facility-administered medications on file prior to visit.    Allergies  Allergen Reactions   Cefdinir     REACTION: unspecified   Ciprofloxacin     rash   Clarithromycin      REACTION: weird dreams (on prednisone at the same time but has tolerated this in the past)   Miglitol     REACTION: unspecified   Niacin     REACTION: unspecified   Qvar [Beclomethasone] Other (See Comments)    Feels funny Feels funny    Past Medical History:  Diagnosis Date   Allergy    Asthma    Diabetes mellitus    Diastolic dysfunction    a. 06/2019 Echo: EF 60-65%, no rwma, GR1 DD. Mild MR. Nl RV fxn.   Essential hypertension    GERD (gastroesophageal reflux disease)    Hyperlipidemia    Mixed hyperlipidemia    Osteopenia    PAF (paroxysmal atrial fibrillation) (HCC)    a. Dx in late 1990's - no known recurrence.   Sleep disorder    Thyroid disease     Past Surgical History:  Procedure Laterality Date   CERVICAL CONE BIOPSY  1960's   CHOLECYSTECTOMY     NASAL POLYP SURGERY  01/01   STRABISMUS SURGERY  1946 / 1967   VAGINAL DELIVERY     x2    Family History  Problem Relation Age of Onset   Asthma Mother    Coronary artery disease Father     Social History   Socioeconomic History   Marital status: Married    Spouse name: Not on file   Number of children: 2   Years of education: Not on file   Highest education level: Not on file  Occupational History    Employer: RETIRED  Tobacco Use   Smoking status: Never   Smokeless tobacco: Never  Vaping Use   Vaping Use: Never used  Substance and Sexual Activity   Alcohol use: Yes    Alcohol/week: 1.0 standard drink    Types: 1 Glasses of wine per week    Comment: 1 glass of wine per day   Drug use: No   Sexual activity: Not Currently  Other Topics Concern   Not on file  Social History Narrative   Has living will   Not sure about formal POA --but requests husband, then daughter [name] (and son [name]) to be health care POAs   Would accept resuscitation attempts   Not sure about feeding tubes   Social Determinants of Health   Financial Resource Strain: Not on file  Food Insecurity: Not on file   Transportation Needs: Not on file  Physical Activity: Not on file  Stress: Not on file  Social Connections: Not on file  Intimate Partner Violence: Not on file   Review of Systems Appetite is fine Weight is stable Hurt right foot--dropped shampoo bottle on it (was bruised) Sleeps okay mostly Wears seat belt No heartburn or dysphagia on the omeprazole Bowels are okay No  dysuria ---does have some stress and urge incontinence    Objective:   Physical Exam Constitutional:      Appearance: Normal appearance.  HENT:     Mouth/Throat:     Comments: No lesions Eyes:     Extraocular Movements: Extraocular movements intact.     Conjunctiva/sclera: Conjunctivae normal.     Pupils: Pupils are equal, round, and reactive to light.  Cardiovascular:     Rate and Rhythm: Normal rate and regular rhythm.     Pulses: Normal pulses.     Heart sounds: No murmur heard.   No gallop.  Pulmonary:     Effort: Pulmonary effort is normal.     Breath sounds: Normal breath sounds. No wheezing or rales.  Abdominal:     Palpations: Abdomen is soft.     Tenderness: There is no abdominal tenderness.  Musculoskeletal:     Cervical back: Neck supple.     Right lower leg: No edema.     Left lower leg: No edema.  Lymphadenopathy:     Cervical: No cervical adenopathy.  Skin:    Findings: No lesion or rash.     Comments: No foot lesions  Neurological:     Mental Status: She is alert and oriented to person, place, and time.     Comments: President---"Biden, Obama, ---Trump" [id number] D-l-r-o-w Recall 2/3  Fairly normal sensation in feet---she notices slight differences though  Psychiatric:        Mood and Affect: Mood normal.        Behavior: Behavior normal.           Assessment & Plan:

## 2021-09-09 NOTE — Assessment & Plan Note (Signed)
Some dysthymia--but no MDD Has xanax for anxiety

## 2021-09-09 NOTE — Assessment & Plan Note (Signed)
I have personally reviewed the Medicare Annual Wellness questionnaire and have noted 1. The patient's medical and social history 2. Their use of alcohol, tobacco or illicit drugs 3. Their current medications and supplements 4. The patient's functional ability including ADL's, fall risks, home safety risks and hearing or visual             impairment. 5. Diet and physical activities 6. Evidence for depression or mood disorders  The patients weight, height, BMI and visual acuity have been recorded in the chart I have made referrals, counseling and provided education to the patient based review of the above and I have provided the pt with a written personalized care plan for preventive services.  I have provided you with a copy of your personalized plan for preventive services. Please take the time to review along with your updated medication list.  No cancer screening due to age Urged her to do some exercise--at least resistance Needs to get the bivalent COVID vaccine Flu vaccine today

## 2021-09-09 NOTE — Assessment & Plan Note (Signed)
Rare On ASA only

## 2021-09-10 LAB — LIPID PANEL
Cholesterol: 124 mg/dL (ref 0–200)
HDL: 51.9 mg/dL (ref >39.00)
NonHDL: 72.42
Total CHOL/HDL Ratio: 2
Triglycerides: 223 mg/dL — ABNORMAL HIGH (ref 0.0–149.0)
VLDL: 44.6 mg/dL — ABNORMAL HIGH (ref 0.0–40.0)

## 2021-09-10 LAB — RENAL FUNCTION PANEL
Albumin: 4.1 g/dL (ref 3.5–5.2)
BUN: 16 mg/dL (ref 6–23)
CO2: 25 mEq/L (ref 19–32)
Calcium: 9.6 mg/dL (ref 8.4–10.5)
Chloride: 101 mEq/L (ref 96–112)
Creatinine, Ser: 0.8 mg/dL (ref 0.40–1.20)
GFR: 65.69 mL/min (ref >60.00)
Glucose, Bld: 131 mg/dL — ABNORMAL HIGH (ref 70–99)
Phosphorus: 4.4 mg/dL (ref 2.3–4.6)
Potassium: 4.9 mEq/L (ref 3.5–5.1)
Sodium: 137 mEq/L (ref 135–145)

## 2021-09-10 LAB — CBC
HCT: 39.5 % (ref 36.0–46.0)
Hemoglobin: 13.2 g/dL (ref 12.0–15.0)
MCHC: 33.3 g/dL (ref 30.0–36.0)
MCV: 88.3 fl (ref 78.0–100.0)
Platelets: 236 10*3/uL (ref 150.0–400.0)
RBC: 4.48 Mil/uL (ref 3.87–5.11)
RDW: 13.5 % (ref 11.5–15.5)
WBC: 7.8 10*3/uL (ref 4.0–10.5)

## 2021-09-10 LAB — HEPATIC FUNCTION PANEL
ALT: 17 U/L (ref 0–35)
AST: 20 U/L (ref 0–37)
Albumin: 4.1 g/dL (ref 3.5–5.2)
Alkaline Phosphatase: 79 U/L (ref 39–117)
Bilirubin, Direct: 0 mg/dL (ref 0.0–0.3)
Total Bilirubin: 0.4 mg/dL (ref 0.2–1.2)
Total Protein: 6.9 g/dL (ref 6.0–8.3)

## 2021-09-10 LAB — LDL CHOLESTEROL, DIRECT: Direct LDL: 45 mg/dL

## 2021-09-10 LAB — T4, FREE: Free T4: 1.53 ng/dL (ref 0.60–1.60)

## 2021-09-10 LAB — TSH: TSH: 2.35 u[IU]/mL (ref 0.35–5.50)

## 2021-09-16 ENCOUNTER — Other Ambulatory Visit: Payer: Self-pay | Admitting: Internal Medicine

## 2021-09-25 ENCOUNTER — Emergency Department: Payer: Medicare PPO

## 2021-09-25 ENCOUNTER — Other Ambulatory Visit: Payer: Self-pay

## 2021-09-25 ENCOUNTER — Emergency Department
Admission: EM | Admit: 2021-09-25 | Discharge: 2021-09-25 | Disposition: A | Discharge status: Home / Self Care | Payer: Medicare PPO | Attending: Emergency Medicine | Admitting: Emergency Medicine

## 2021-09-25 DIAGNOSIS — U071 COVID-19: Secondary | ICD-10-CM | POA: Insufficient documentation

## 2021-09-25 DIAGNOSIS — R059 Cough, unspecified: Secondary | ICD-10-CM | POA: Diagnosis not present

## 2021-09-25 DIAGNOSIS — R509 Fever, unspecified: Secondary | ICD-10-CM | POA: Diagnosis present

## 2021-09-25 DIAGNOSIS — Z5321 Procedure and treatment not carried out due to patient leaving prior to being seen by health care provider: Secondary | ICD-10-CM | POA: Diagnosis not present

## 2021-09-25 LAB — BASIC METABOLIC PANEL
Anion gap: 10 (ref 5–15)
BUN: 18 mg/dL (ref 8–23)
CO2: 24 mmol/L (ref 22–32)
Calcium: 8.9 mg/dL (ref 8.9–10.3)
Chloride: 96 mmol/L — ABNORMAL LOW (ref 98–111)
Creatinine, Ser: 0.76 mg/dL (ref 0.44–1.00)
GFR, Estimated: >60 mL/min (ref >60)
Glucose, Bld: 158 mg/dL — ABNORMAL HIGH (ref 70–99)
Potassium: 4.3 mmol/L (ref 3.5–5.1)
Sodium: 130 mmol/L — ABNORMAL LOW (ref 135–145)

## 2021-09-25 LAB — CBC WITH DIFFERENTIAL/PLATELET
Abs Immature Granulocytes: 0.02 10*3/uL (ref 0.00–0.07)
Basophils Absolute: 0 10*3/uL (ref 0.0–0.1)
Basophils Relative: 1 %
Eosinophils Absolute: 0 10*3/uL (ref 0.0–0.5)
Eosinophils Relative: 0 %
HCT: 39.2 % (ref 36.0–46.0)
Hemoglobin: 13.2 g/dL (ref 12.0–15.0)
Immature Granulocytes: 0 %
Lymphocytes Relative: 21 %
Lymphs Abs: 1.4 10*3/uL (ref 0.7–4.0)
MCH: 29.5 pg (ref 26.0–34.0)
MCHC: 33.7 g/dL (ref 30.0–36.0)
MCV: 87.5 fL (ref 80.0–100.0)
Monocytes Absolute: 1.2 10*3/uL — ABNORMAL HIGH (ref 0.1–1.0)
Monocytes Relative: 18 %
Neutro Abs: 4.1 10*3/uL (ref 1.7–7.7)
Neutrophils Relative %: 60 %
Platelets: 240 10*3/uL (ref 150–400)
RBC: 4.48 MIL/uL (ref 3.87–5.11)
RDW: 13.1 % (ref 11.5–15.5)
WBC: 6.7 10*3/uL (ref 4.0–10.5)
nRBC: 0 % (ref 0.0–0.2)

## 2021-09-25 NOTE — ED Provider Notes (Signed)
  Emergency Medicine Provider Triage Evaluation Note  [name], a 87 y.o. female  was evaluated in triage.  Pt complains of headache and cough.  Patient describes onset last night, and she tested today and was confirmed COVID-positive.  She denies any significant cough or congestion at this time patient denies any chest pain or shortness of breath.  Review of Systems  Positive: Headache, Covid + Negative: CP, SOB  Physical Exam  BP (!) 141/81 (BP Location: Left Arm)   Pulse 84   Temp 99.7 F (37.6 C) (Oral)   Resp 20   Ht 5\' 4"  (1.626 m)   Wt 79.4 kg   SpO2 94%   BMI 30.04 kg/m  Gen:   Awake, no distress  NAD Resp:  Normal effort CTA MSK:   Moves extremities without difficulty  Other:  CVS: RRR  Medical Decision Making  Medically screening exam initiated at 6:28 PM.  Appropriate orders placed.  Clarice P Yost was informed that the remainder of the evaluation will be completed by another provider, this initial triage assessment does not replace that evaluation, and the importance of remaining in the ED until their evaluation is complete.  Patient ED evaluation of symptoms related to recent COVID diagnosis.   Baird Lyons, PA-C 09/25/21 1835    13/12/22, MD 09/29/21 [phone]

## 2021-09-25 NOTE — ED Triage Notes (Addendum)
Pt c/o fever, headache, sore throat, cough since last night. Pt states she was exposed to COVID- husband was hospitalized. Pt is AOX4, NAD noted. No cough or congestion noted at this time. Pt denies N/V/D, constipation, SOB, CP.

## 2021-09-26 LAB — RESP PANEL BY RT-PCR (FLU A&B, COVID) ARPGX2
Influenza A by PCR: NEGATIVE
Influenza B by PCR: NEGATIVE
SARS Coronavirus 2 by RT PCR: POSITIVE — AB

## 2021-09-27 ENCOUNTER — Telehealth: Payer: Self-pay

## 2021-09-27 MED ORDER — MOLNUPIRAVIR 200 MG PO CAPS: 4.0000 | ORAL_CAPSULE | Freq: Two times a day (BID) | ORAL | 0 refills | Status: DC

## 2021-09-27 NOTE — Telephone Encounter (Signed)
I spoke with [name]; pt left [facility] ED on 09/25/21 before being seen.  Pt started with H/A, low grade fever on 09/25/21; pt waited for 8 hrs in ED and then left; today pt sleeping at present; last night started prod cough with clear phlegm; last fever 09/26/21 at 4 PM 99.7; pt is taking tylenol also. Pt also has S/T on lt side of throat that is sore when swallows. Pt had 2 covid vaccines and one booster. [name] said pt appears to be resting quietly now; does not appear to be in any distress in breathing and would like to let pt rest for now if possible. UC & ED precautions given and [name] voiced understanding. Nelly Rout she would get cb after reviewed by Dr Alphonsus Sias who will be in office this afternoon. Sending note to Dr Alphonsus Sias and Carollee Herter CMA; will also teams [facility]. [facility]

## 2021-09-27 NOTE — Telephone Encounter (Signed)
Order sent.

## 2021-09-27 NOTE — Telephone Encounter (Signed)
Spoke to pt and pt's daughter. She had a headache this morning. Seeing some improvement. Has some pain in front of throat. Symptoms started 11-11. Asking if molnupiravir could be called in if she is not improving and decides to take it.

## 2021-09-27 NOTE — Telephone Encounter (Signed)
[facility] Night - Client TELEPHONE ADVICE RECORD [facility] Patient Name: [name] Gender: Female DOB: 01/28/1934 Age: 87 Y 7 M 27 D Return Phone Number: [phone] (Primary), [phone] (Secondary) Address: City/ State/ Zip: [place]  [address] Client [facility] Night - Client Client Site [facility] - Night Physician Tillman Abide- MD Contact Type Call Who Is Calling Patient / Member / Family / Caregiver Call Type Triage / Clinical Caller Name Yesica Kemler Relationship To Patient Daughter Return Phone Number [phone] (Primary) Chief Complaint Headache Reason for Call Symptomatic / Request for Health Information Initial Comment Caller states her mother tested positive for Covid. She has headache, fever. Translation No Nurse Assessment Nurse: Tresa Endo, RN, Kim Date/Time Lamount Cohen Time): 09/25/2021 2:36:39 PM Confirm and document reason for call. If symptomatic, describe symptoms. ---Caller states her mother has a headache a fever; sxs started during the night and her throat also felt scratchy. Caller states her mother feels very warm and cheeks are flushed but thermometer isn't working. Mother tested positive for Covid this morning. Does the patient have any new or worsening symptoms? ---Yes Will a triage be completed? ---Yes Related visit to physician within the last 2 weeks? ---No Does the PT have any chronic conditions? (i.e. diabetes, asthma, this includes High risk factors for pregnancy, etc.) ---Yes List chronic conditions. ---Diabetes, A fib, HTN, Asthma Is this a behavioral health or substance abuse call? ---No Guidelines Guideline Title Affirmed Question Affirmed Notes Nurse Date/Time (Eastern Time) COVID-19 - Diagnosed or Suspected Oxygen level (e.g., pulse oximetry) 90 percent or lower Tresa Endo, RN, Selena Batten 09/25/2021 2:40:10 PM Disp. Time Lamount Cohen Time)  Disposition Final User 09/25/2021 2:47:53 PM Go to ED Now Yes Tresa Endo, RN, Kim PLEASE NOTE: All timestamps contained within this report are represented as [place] Standard Time. CONFIDENTIALTY NOTICE: This fax transmission is intended only for the addressee. It contains information that is legally privileged, confidential or otherwise protected from use or disclosure. If you are not the intended recipient, you are strictly prohibited from reviewing, disclosing, copying using or disseminating any of this information or taking any action in reliance on or regarding this information. If you have received this fax in error, please notify us immediately by telephone so that we can arrange for its return to [place]. Phone: [phone], Toll-Free: [phone], Fax: [phone] Page: 2 of 2 Call Id: [id number] Caller Disagree/Comply Comply Caller Understands Yes PreDisposition InappropriateToAsk Care Advice Given Per Guideline GO TO ED NOW: * You need to be seen in the Emergency Department. * Go to the ED at ___________ Hospital. TELL HEALTHCARE PERSONNEL THAT YOU MIGHT HAVE COVID-19: * Tell the first healthcare worker you meet that you may have COVID-19. * Tell them you have symptoms and have been sent in for testing. WEAR A MASK - COVER YOUR MOUTH AND NOSE: * Wear a mask that fits snuggly over your mouth and nose. ANOTHER ADULT SHOULD DRIVE: * It is better and safer if another adult drives instead of you. CALL EMS 911 IF: * Severe difficulty breathing occurs * Lips or face turns blue * Confusion occurs. CARE ADVICE given per COVID-19 - DIAGNOSED OR SUSPECTED (Adult) guideline. Comments User: Lovenia Shuck, RN Date/Time Lamount Cohen Time): 09/25/2021 2:42:08 PM Current headache pain is 6/10, was 9/10 last night. User: Lovenia Shuck, RN Date/Time Lamount Cohen Time): 09/25/2021 2:46:56 PM O2 sat is 88% Referrals [facility] - E

## 2021-10-03 ENCOUNTER — Other Ambulatory Visit: Payer: Self-pay | Admitting: Internal Medicine

## 2021-10-04 ENCOUNTER — Telehealth: Payer: Self-pay | Admitting: Internal Medicine

## 2021-10-04 MED ORDER — PREDNISONE 20 MG PO TABS: 40.0000 mg | ORAL_TABLET | Freq: Every day | ORAL | 0 refills | Status: DC

## 2021-10-04 MED ORDER — SPACER/AERO-HOLDING CHAMBERS DEVI: 1.0000 | Freq: Once | 0 refills | Status: AC

## 2021-10-04 NOTE — Telephone Encounter (Signed)
When we spoke last week, she was doing OTC meds. Will forward to Dr Alphonsus Sias. We can send in a spacer.

## 2021-10-04 NOTE — Telephone Encounter (Signed)
Let her know that I did send prednisone for he to try. Have her monitor her sugars carefully

## 2021-10-04 NOTE — Telephone Encounter (Signed)
Ms. [name] called in and stated that Ms. [name] recently had covid but she still not feeling well she is still coughing and has phlegm and its in her chest. And wanted to know if she could get a call from shannon and also wanted to know about getting another tube for her inhaler due to she is has given hers to her husband due to he is in a facility.

## 2021-10-04 NOTE — Telephone Encounter (Signed)
Spoke to pt. She appreciated the prednisone.

## 2021-10-04 NOTE — Telephone Encounter (Signed)
Spoke to pt. She was asking what else can she do for the cough. She was asking about prednisone to settle here cough with her lungs. Said the albuterol helps some.

## 2021-10-29 ENCOUNTER — Other Ambulatory Visit: Payer: Self-pay

## 2021-10-29 ENCOUNTER — Ambulatory Visit: Payer: Medicare PPO | Admitting: Internal Medicine

## 2021-10-29 ENCOUNTER — Encounter: Payer: Self-pay | Admitting: Internal Medicine

## 2021-10-29 DIAGNOSIS — J069 Acute upper respiratory infection, unspecified: Secondary | ICD-10-CM | POA: Diagnosis not present

## 2021-10-29 MED ORDER — PREDNISONE 20 MG PO TABS: 40.0000 mg | ORAL_TABLET | Freq: Every day | ORAL | 1 refills | Status: DC

## 2021-10-29 NOTE — Progress Notes (Signed)
Subjective:    Patient ID: [name], female    DOB: 05-06-1931, 87 y.o.   MRN: [id number]  HPI Here with daughter due to respiratory infection  Started with respiratory symptoms yesterday May have caught something from daughter --who has been visiting husband in rehab. She got COVID a month ago and patient did also Did seem to get better (prednisone and antiviral) Still feeling tired and finally improved  Daughter got sick again 6 days ago--COVID negative Now patient started also Sore throat x 2 days Some cough--bronchial and not in lungs Not SOB and no wheezing Nasal congestion  Continues on the singulair Gargling with salt water--that helps  Current Outpatient Medications on File Prior to Visit  Medication Sig Dispense Refill   ACCU-CHEK SMARTVIEW test strip TEST BLOOD SUGAR BID UTD  3   albuterol (VENTOLIN HFA) 108 (90 Base) MCG/ACT inhaler Inhale 2 puffs into the lungs every 6 (six) hours as needed. 18 g 1   ALPRAZolam (XANAX) 0.25 MG tablet TAKE 1 TABLET(0.25 MG) BY MOUTH TWICE DAILY AS NEEDED FOR ANXIETY 60 tablet 0   aspirin 81 MG tablet Take 81 mg by mouth daily.     atorvastatin (LIPITOR) 20 MG tablet TAKE 1 TABLET BY MOUTH EVERY DAY 90 tablet 3   B-D UF III MINI PEN NEEDLES 31G X 5 MM MISC USE ONCE TO TWICE DAILY 200 each 4   B-D ULTRA-FINE 33 LANCETS MISC Use to test blood sugar twice daily dx: E11.9 200 each 3   calcitonin, salmon, (MIACALCIN/FORTICAL) 200 UNIT/ACT nasal spray USE 1 SPRAY NASALLY EVERY DAY( ALTERNATING NOSTRILS) 3.7 mL 11   CALCIUM-VITAMIN D PO Take 1 tablet by mouth daily.     cetirizine (ZYRTEC) 1 MG/ML syrup Take 10 mg by mouth daily.     Chromium 100 MCG TABS Take by mouth daily.     Coenzyme Q10 200 MG capsule Take 200 mg by mouth daily.     fish oil-omega-3 fatty acids 1000 MG capsule Take 1 g by mouth daily.     fluticasone (FLONASE) 50 MCG/ACT nasal spray Place 2 sprays into both nostrils daily. 16 g 11   folic acid (FOLVITE) 1 MG  tablet Take 1 mg by mouth daily.     insulin glargine (LANTUS) 100 UNIT/ML Solostar Pen 28 Units.      levothyroxine (SYNTHROID, LEVOTHROID) 100 MCG tablet Take 100 mcg by mouth daily before breakfast.     meclizine (ANTIVERT) 25 MG tablet Take 1 tablet (25 mg total) by mouth 3 (three) times daily as needed. 90 tablet 0   metFORMIN (GLUCOPHAGE-XR) 500 MG 24 hr tablet Take 1 tablet by mouth in the morning and at bedtime.     metoprolol succinate (TOPROL-XL) 50 MG 24 hr tablet TAKE 1 TABLET(50 MG) BY MOUTH DAILY 90 tablet 3   montelukast (SINGULAIR) 10 MG tablet TAKE 1 TABLET BY MOUTH EVERY NIGHT AT BEDTIME 90 tablet 3   Multiple Vitamin (MULTIVITAMIN) tablet Take 1 tablet by mouth daily.     omeprazole (PRILOSEC) 20 MG capsule TAKE 1 CAPSULE BY MOUTH TWICE DAILY BEFORE A MEAL 180 capsule 3   vitamin B-12 (CYANOCOBALAMIN) 1000 MCG tablet Take 1,000 mcg by mouth daily.     vitamin C (ASCORBIC ACID) 500 MG tablet Take 500 mg by mouth daily.     No current facility-administered medications on file prior to visit.    Allergies  Allergen Reactions   Cefdinir     REACTION: unspecified  Ciprofloxacin     rash   Clarithromycin     REACTION: weird dreams (on prednisone at the same time but has tolerated this in the past)   Miglitol     REACTION: unspecified   Niacin     REACTION: unspecified   Qvar [Beclomethasone] Other (See Comments)    Feels funny Feels funny    Past Medical History:  Diagnosis Date   Allergy    Asthma    Diabetes mellitus    Diastolic dysfunction    a. 06/2019 Echo: EF 60-65%, no rwma, GR1 DD. Mild MR. Nl RV fxn.   Essential hypertension    GERD (gastroesophageal reflux disease)    Hyperlipidemia    Mixed hyperlipidemia    Osteopenia    PAF (paroxysmal atrial fibrillation) (HCC)    a. Dx in late 1990's - no known recurrence.   Sleep disorder    Thyroid disease     Past Surgical History:  Procedure Laterality Date   CERVICAL CONE BIOPSY  1960's    CHOLECYSTECTOMY     NASAL POLYP SURGERY  01/01   STRABISMUS SURGERY  1946 / 1967   VAGINAL DELIVERY     x2    Family History  Problem Relation Age of Onset   Asthma Mother    Coronary artery disease Father     Social History   Socioeconomic History   Marital status: Married    Spouse name: Not on file   Number of children: 2   Years of education: Not on file   Highest education level: Not on file  Occupational History    Employer: RETIRED  Tobacco Use   Smoking status: Never   Smokeless tobacco: Never  Vaping Use   Vaping Use: Never used  Substance and Sexual Activity   Alcohol use: Yes    Alcohol/week: 1.0 standard drink    Types: 1 Glasses of wine per week    Comment: 1 glass of wine per day   Drug use: No   Sexual activity: Not Currently  Other Topics Concern   Not on file  Social History Narrative   Has living will   Not sure about formal POA --but requests husband, then daughter [name] (and son [name]) to be health care POAs   Would accept resuscitation attempts   Not sure about feeding tubes   Social Determinants of Health   Financial Resource Strain: Not on file  Food Insecurity: Not on file  Transportation Needs: Not on file  Physical Activity: Not on file  Stress: Not on file  Social Connections: Not on file  Intimate Partner Violence: Not on file   Review of Systems No N/V Eating okay Sleeping per her usual--just nocturia    Objective:   Physical Exam Constitutional:      Appearance: She is not ill-appearing.  HENT:     Right Ear: Tympanic membrane and ear canal normal.     Left Ear: Tympanic membrane and ear canal normal.     Nose:     Comments: Mild congestion    Mouth/Throat:     Pharynx: No oropharyngeal exudate or posterior oropharyngeal erythema.  Pulmonary:     Effort: Pulmonary effort is normal.     Breath sounds: Normal breath sounds. No rhonchi or rales.     Comments: Slight upper airway wheeze Musculoskeletal:     Cervical  back: Neck supple.  Lymphadenopathy:     Cervical: No cervical adenopathy.  Neurological:     Mental Status:  She is alert.           Assessment & Plan:

## 2021-10-29 NOTE — Assessment & Plan Note (Signed)
Had COVID a month ago--so not that Discussed supportive care Asthma not flared--but if worsens, can take the prednisone (will send Rx)

## 2021-11-01 ENCOUNTER — Telehealth: Payer: Self-pay | Admitting: Internal Medicine

## 2021-11-01 MED ORDER — AMOXICILLIN-POT CLAVULANATE 875-125 MG PO TABS: 1.0000 | ORAL_TABLET | Freq: Two times a day (BID) | ORAL | 0 refills | Status: DC

## 2021-11-01 NOTE — Telephone Encounter (Signed)
Spoke to [place]. Advised her to let us know if there is no improvement by Thursday since we are closing at 1 on Friday and Dr Alphonsus Sias will be out of the office next week.

## 2021-11-01 NOTE — Telephone Encounter (Signed)
Pt was seen on 10/29/21 for congestion and a cough. She is now coughing up yellow mucos with a worse cough. Her daughter is wanting to get something called in for her. [id number] [place], [place] - [address] AT [facility] OF [address]  [address] [place], [place] [address]  Phone:  [phone]  Fax:  [phone]  DEA #:  [id number]

## 2021-11-03 NOTE — Telephone Encounter (Signed)
Pt daughter called her mothers cough is getting worse and they would like something called in

## 2021-11-03 NOTE — Telephone Encounter (Signed)
Spoke to pt and [place]. She is on the augmentin and prednisone. She is starting to get laryngitis. Asking about something for cough with codeine possibly. Aware it would be tomorrow before we respond. They do not have a nebulizer. Suggested sitting in a hot steamy bathroom or a vaporizer.   They wanted to bring her in so I put her in at 145 tomorrow.

## 2021-11-04 ENCOUNTER — Other Ambulatory Visit: Payer: Self-pay

## 2021-11-04 ENCOUNTER — Ambulatory Visit: Payer: Medicare PPO | Admitting: Internal Medicine

## 2021-11-04 ENCOUNTER — Encounter: Payer: Self-pay | Admitting: Internal Medicine

## 2021-11-04 DIAGNOSIS — J45909 Unspecified asthma, uncomplicated: Secondary | ICD-10-CM | POA: Insufficient documentation

## 2021-11-04 DIAGNOSIS — J4521 Mild intermittent asthma with (acute) exacerbation: Secondary | ICD-10-CM | POA: Diagnosis not present

## 2021-11-04 MED ORDER — PREDNISONE 20 MG PO TABS: 40.0000 mg | ORAL_TABLET | Freq: Every day | ORAL | 0 refills | Status: DC

## 2021-11-04 MED ORDER — HYDROCODONE BIT-HOMATROP MBR 5-1.5 MG/5ML PO SOLN: 5.0000 mL | Freq: Every evening | ORAL | 0 refills | Status: DC | PRN

## 2021-11-04 NOTE — Assessment & Plan Note (Signed)
Still somewhat tight and wheezy Will give extension for the prednisone Continue albuterol Hydrocodone syrup prn

## 2021-11-04 NOTE — Telephone Encounter (Signed)
Okay I will reassess her at the OV

## 2021-11-04 NOTE — Progress Notes (Signed)
Subjective:    Patient ID: [name], female    DOB: Mar 14, 1931, 87 y.o.   MRN: [id number]  HPI Here with daughter again due to persistent cough  Still has "coughing jags" Felt like it was in her right throat--and center chest Coughed so bad last night that it looked pink (like blood)  Feels irritated "prickly in bronchial" tubes Chewed cough lozenges did help some  The jags are happening 2-3 times a day Close to vomiting  No fever No SOB Tried albuterol last night--may have loosened her up some Did not really think mucinex DM helped   Current Outpatient Medications on File Prior to Visit  Medication Sig Dispense Refill   ACCU-CHEK SMARTVIEW test strip TEST BLOOD SUGAR BID UTD  3   albuterol (VENTOLIN HFA) 108 (90 Base) MCG/ACT inhaler Inhale 2 puffs into the lungs every 6 (six) hours as needed. 18 g 1   ALPRAZolam (XANAX) 0.25 MG tablet TAKE 1 TABLET(0.25 MG) BY MOUTH TWICE DAILY AS NEEDED FOR ANXIETY 60 tablet 0   amoxicillin-clavulanate (AUGMENTIN) 875-125 MG tablet Take 1 tablet by mouth 2 (two) times daily. 14 tablet 0   aspirin 81 MG tablet Take 81 mg by mouth daily.     atorvastatin (LIPITOR) 20 MG tablet TAKE 1 TABLET BY MOUTH EVERY DAY 90 tablet 3   B-D UF III MINI PEN NEEDLES 31G X 5 MM MISC USE ONCE TO TWICE DAILY 200 each 4   B-D ULTRA-FINE 33 LANCETS MISC Use to test blood sugar twice daily dx: E11.9 200 each 3   calcitonin, salmon, (MIACALCIN/FORTICAL) 200 UNIT/ACT nasal spray USE 1 SPRAY NASALLY EVERY DAY( ALTERNATING NOSTRILS) 3.7 mL 11   CALCIUM-VITAMIN D PO Take 1 tablet by mouth daily.     cetirizine (ZYRTEC) 1 MG/ML syrup Take 10 mg by mouth daily.     Chromium 100 MCG TABS Take by mouth daily.     Coenzyme Q10 200 MG capsule Take 200 mg by mouth daily.     fish oil-omega-3 fatty acids 1000 MG capsule Take 1 g by mouth daily.     fluticasone (FLONASE) 50 MCG/ACT nasal spray Place 2 sprays into both nostrils daily. 16 g 11   folic acid (FOLVITE) 1 MG  tablet Take 1 mg by mouth daily.     insulin glargine (LANTUS) 100 UNIT/ML Solostar Pen 28 Units.      levothyroxine (SYNTHROID, LEVOTHROID) 100 MCG tablet Take 100 mcg by mouth daily before breakfast.     meclizine (ANTIVERT) 25 MG tablet Take 1 tablet (25 mg total) by mouth 3 (three) times daily as needed. 90 tablet 0   metFORMIN (GLUCOPHAGE-XR) 500 MG 24 hr tablet Take 1 tablet by mouth in the morning and at bedtime.     metoprolol succinate (TOPROL-XL) 50 MG 24 hr tablet TAKE 1 TABLET(50 MG) BY MOUTH DAILY 90 tablet 3   montelukast (SINGULAIR) 10 MG tablet TAKE 1 TABLET BY MOUTH EVERY NIGHT AT BEDTIME 90 tablet 3   Multiple Vitamin (MULTIVITAMIN) tablet Take 1 tablet by mouth daily.     omeprazole (PRILOSEC) 20 MG capsule TAKE 1 CAPSULE BY MOUTH TWICE DAILY BEFORE A MEAL 180 capsule 3   predniSONE (DELTASONE) 20 MG tablet Take 2 tablets (40 mg total) by mouth daily. For 3 days, then 1 tab daily for 3 days 9 tablet 1   vitamin B-12 (CYANOCOBALAMIN) 1000 MCG tablet Take 1,000 mcg by mouth daily.     vitamin C (ASCORBIC ACID) 500 MG  tablet Take 500 mg by mouth daily.     No current facility-administered medications on file prior to visit.    Allergies  Allergen Reactions   Cefdinir     REACTION: unspecified   Ciprofloxacin     rash   Clarithromycin     REACTION: weird dreams (on prednisone at the same time but has tolerated this in the past)   Miglitol     REACTION: unspecified   Niacin     REACTION: unspecified   Qvar [Beclomethasone] Other (See Comments)    Feels funny Feels funny    Past Medical History:  Diagnosis Date   Allergy    Asthma    Diabetes mellitus    Diastolic dysfunction    a. 06/2019 Echo: EF 60-65%, no rwma, GR1 DD. Mild MR. Nl RV fxn.   Essential hypertension    GERD (gastroesophageal reflux disease)    Hyperlipidemia    Mixed hyperlipidemia    Osteopenia    PAF (paroxysmal atrial fibrillation) (HCC)    a. Dx in late 1990's - no known recurrence.    Sleep disorder    Thyroid disease     Past Surgical History:  Procedure Laterality Date   CERVICAL CONE BIOPSY  1960's   CHOLECYSTECTOMY     NASAL POLYP SURGERY  01/01   STRABISMUS SURGERY  1946 / 1967   VAGINAL DELIVERY     x2    Family History  Problem Relation Age of Onset   Asthma Mother    Coronary artery disease Father     Social History   Socioeconomic History   Marital status: Married    Spouse name: Not on file   Number of children: 2   Years of education: Not on file   Highest education level: Not on file  Occupational History    Employer: RETIRED  Tobacco Use   Smoking status: Never   Smokeless tobacco: Never  Vaping Use   Vaping Use: Never used  Substance and Sexual Activity   Alcohol use: Yes    Alcohol/week: 1.0 standard drink    Types: 1 Glasses of wine per week    Comment: 1 glass of wine per day   Drug use: No   Sexual activity: Not Currently  Other Topics Concern   Not on file  Social History Narrative   Has living will   Not sure about formal POA --but requests husband, then daughter [name] (and son [name]) to be health care POAs   Would accept resuscitation attempts   Not sure about feeding tubes   Social Determinants of Health   Financial Resource Strain: Not on file  Food Insecurity: Not on file  Transportation Needs: Not on file  Physical Activity: Not on file  Stress: Not on file  Social Connections: Not on file  Intimate Partner Violence: Not on file   Review of Systems Eating okay No general N/V     Objective:   Physical Exam Constitutional:      Appearance: Normal appearance.  HENT:     Mouth/Throat:     Pharynx: No oropharyngeal exudate or posterior oropharyngeal erythema.  Pulmonary:     Effort: Pulmonary effort is normal.     Breath sounds: No rales.     Comments: Mild increased expiratory phase and wheezing Musculoskeletal:     Cervical back: Neck supple.  Lymphadenopathy:     Cervical: No cervical  adenopathy.  Neurological:     Mental Status: She is alert.  Assessment & Plan:

## 2021-11-15 ENCOUNTER — Other Ambulatory Visit: Payer: Self-pay | Admitting: Internal Medicine

## 2021-11-16 NOTE — Telephone Encounter (Signed)
Last filled 08-12-21 #60 Last OV 11-04-21 Next OV 09-15-22 [facility]

## 2021-11-18 DIAGNOSIS — E113393 Type 2 diabetes mellitus with moderate nonproliferative diabetic retinopathy without macular edema, bilateral: Secondary | ICD-10-CM | POA: Diagnosis not present

## 2021-11-18 LAB — HM DIABETES EYE EXAM

## 2021-11-23 DIAGNOSIS — H6123 Impacted cerumen, bilateral: Secondary | ICD-10-CM | POA: Diagnosis not present

## 2021-11-23 DIAGNOSIS — H903 Sensorineural hearing loss, bilateral: Secondary | ICD-10-CM | POA: Diagnosis not present

## 2021-11-30 ENCOUNTER — Other Ambulatory Visit: Payer: Self-pay | Admitting: Cardiovascular Disease

## 2021-12-13 LAB — HM DIABETES EYE EXAM

## 2021-12-17 DIAGNOSIS — E1165 Type 2 diabetes mellitus with hyperglycemia: Secondary | ICD-10-CM | POA: Diagnosis not present

## 2021-12-17 DIAGNOSIS — E063 Autoimmune thyroiditis: Secondary | ICD-10-CM | POA: Diagnosis not present

## 2021-12-17 DIAGNOSIS — Z794 Long term (current) use of insulin: Secondary | ICD-10-CM | POA: Diagnosis not present

## 2021-12-17 DIAGNOSIS — E113293 Type 2 diabetes mellitus with mild nonproliferative diabetic retinopathy without macular edema, bilateral: Secondary | ICD-10-CM | POA: Diagnosis not present

## 2021-12-20 DIAGNOSIS — Z794 Long term (current) use of insulin: Secondary | ICD-10-CM | POA: Diagnosis not present

## 2021-12-20 DIAGNOSIS — E063 Autoimmune thyroiditis: Secondary | ICD-10-CM | POA: Diagnosis not present

## 2021-12-20 DIAGNOSIS — E113293 Type 2 diabetes mellitus with mild nonproliferative diabetic retinopathy without macular edema, bilateral: Secondary | ICD-10-CM | POA: Diagnosis not present

## 2021-12-20 DIAGNOSIS — E1165 Type 2 diabetes mellitus with hyperglycemia: Secondary | ICD-10-CM | POA: Diagnosis not present

## 2021-12-27 ENCOUNTER — Telehealth: Payer: Self-pay | Admitting: Internal Medicine

## 2021-12-27 DIAGNOSIS — Z111 Encounter for screening for respiratory tuberculosis: Secondary | ICD-10-CM

## 2021-12-27 NOTE — Telephone Encounter (Signed)
Its 2  medications that have changed which is Metformin  no change in the morning but in the evening has to take it before dinner.  [place]- 26 units before dinner  And needs the TB blood test

## 2021-12-27 NOTE — Telephone Encounter (Signed)
Mrs Pratt called in for her mom due to her and her husband are both moving into an assisted living apartment and need to do the [facility] and she recently had medication change when she went to the endocrinologist

## 2021-12-27 NOTE — Addendum Note (Signed)
Addended by: Tillman Abide I on: 12/27/2021 12:51 PM   Modules accepted: Orders

## 2021-12-27 NOTE — Telephone Encounter (Signed)
Please add the meds to the FL-2 and dates of pneumonia vaccines (imm record won't open for me). Schedule lab draw for quantiferon (ordered) $29 charge

## 2021-12-28 NOTE — Telephone Encounter (Signed)
Spoke to pt's daughter. Advised forms are ready and up front for pickup. $29 charge. They will pickup on 2-16 at her OV with Dr Alphonsus Sias.

## 2021-12-30 ENCOUNTER — Ambulatory Visit (INDEPENDENT_AMBULATORY_CARE_PROVIDER_SITE_OTHER): Payer: Medicare PPO | Admitting: Internal Medicine

## 2021-12-30 ENCOUNTER — Encounter: Payer: Self-pay | Admitting: Internal Medicine

## 2021-12-30 ENCOUNTER — Other Ambulatory Visit: Payer: Self-pay

## 2021-12-30 DIAGNOSIS — F39 Unspecified mood [affective] disorder: Secondary | ICD-10-CM

## 2021-12-30 DIAGNOSIS — Z111 Encounter for screening for respiratory tuberculosis: Secondary | ICD-10-CM | POA: Diagnosis not present

## 2021-12-30 NOTE — Assessment & Plan Note (Signed)
She is a little nervous about the move--but not depressed Happy about being able to be with husband--while he has the support she will need She still uses the xanax 0.25 prn

## 2021-12-30 NOTE — Progress Notes (Signed)
Subjective:    Patient ID: [name], female    DOB: 1931-01-25, 88 y.o.   MRN: [id number]  HPI Here with daughter to review assisted living plans Going to [facility] in [place]  Only way she can be with husband is for AL Has 2 bedroom/2 bath apartment 3 meals Husband will get the ADL services that he needs  He is at [facility] still for rehab Not doing as well as before---not really able to walk much (even with walker)  She is okay with this move--but mildly anxious The plan is for both of them to move in March 1st  Insulin changed to 26 units ---but before dinner now Metformin before dinner Now has new endocrine doctor at [facility]  Current Outpatient Medications on File Prior to Visit  Medication Sig Dispense Refill   ACCU-CHEK SMARTVIEW test strip TEST BLOOD SUGAR BID UTD  3   albuterol (VENTOLIN HFA) 108 (90 Base) MCG/ACT inhaler Inhale 2 puffs into the lungs every 6 (six) hours as needed. 18 g 1   ALPRAZolam (XANAX) 0.25 MG tablet TAKE 1 TABLET(0.25 MG) BY MOUTH TWICE DAILY AS NEEDED FOR ANXIETY 60 tablet 0   aspirin 81 MG tablet Take 81 mg by mouth daily.     atorvastatin (LIPITOR) 20 MG tablet TAKE 1 TABLET BY MOUTH EVERY DAY 90 tablet 3   B-D UF III MINI PEN NEEDLES 31G X 5 MM MISC USE ONCE TO TWICE DAILY 200 each 4   B-D ULTRA-FINE 33 LANCETS MISC Use to test blood sugar twice daily dx: E11.9 200 each 3   calcitonin, salmon, (MIACALCIN/FORTICAL) 200 UNIT/ACT nasal spray USE 1 SPRAY NASALLY EVERY DAY( ALTERNATING NOSTRILS) 3.7 mL 11   CALCIUM-VITAMIN D PO Take 1 tablet by mouth daily.     cetirizine (ZYRTEC) 1 MG/ML syrup Take 10 mg by mouth daily.     Chromium 100 MCG TABS Take by mouth daily.     Coenzyme Q10 200 MG capsule Take 200 mg by mouth daily.     fish oil-omega-3 fatty acids 1000 MG capsule Take 1 g by mouth daily.     fluticasone (FLONASE) 50 MCG/ACT nasal spray Place 2 sprays into both nostrils daily. 16 g 11   folic acid (FOLVITE) 1 MG  tablet Take 1 mg by mouth daily.     HYDROcodone bit-homatropine (HYCODAN) 5-1.5 MG/5ML syrup Take 5 mLs by mouth at bedtime as needed for cough. 120 mL 0   insulin glargine (LANTUS) 100 UNIT/ML Solostar Pen 26 Units.     levothyroxine (SYNTHROID, LEVOTHROID) 100 MCG tablet Take 100 mcg by mouth daily before breakfast.     meclizine (ANTIVERT) 25 MG tablet Take 1 tablet (25 mg total) by mouth 3 (three) times daily as needed. 90 tablet 0   metFORMIN (GLUCOPHAGE-XR) 500 MG 24 hr tablet Take 1 tablet by mouth 2 (two) times daily before a meal.     metoprolol succinate (TOPROL-XL) 50 MG 24 hr tablet TAKE 1 TABLET(50 MG) BY MOUTH DAILY 90 tablet 3   montelukast (SINGULAIR) 10 MG tablet TAKE 1 TABLET BY MOUTH EVERY NIGHT AT BEDTIME 90 tablet 3   Multiple Vitamin (MULTIVITAMIN) tablet Take 1 tablet by mouth daily.     omeprazole (PRILOSEC) 20 MG capsule TAKE 1 CAPSULE BY MOUTH TWICE DAILY BEFORE A MEAL 180 capsule 3   vitamin B-12 (CYANOCOBALAMIN) 1000 MCG tablet Take 1,000 mcg by mouth daily.     vitamin C (ASCORBIC ACID) 500 MG tablet Take 500  mg by mouth daily.     No current facility-administered medications on file prior to visit.    Allergies  Allergen Reactions   Cefdinir     REACTION: unspecified   Ciprofloxacin     rash   Clarithromycin     REACTION: weird dreams (on prednisone at the same time but has tolerated this in the past)   Miglitol     REACTION: unspecified   Niacin     REACTION: unspecified   Qvar [Beclomethasone] Other (See Comments)    Feels funny Feels funny    Past Medical History:  Diagnosis Date   Allergy    Asthma    Diabetes mellitus    Diastolic dysfunction    a. 06/2019 Echo: EF 60-65%, no rwma, GR1 DD. Mild MR. Nl RV fxn.   Essential hypertension    GERD (gastroesophageal reflux disease)    Hyperlipidemia    Mixed hyperlipidemia    Osteopenia    PAF (paroxysmal atrial fibrillation) (HCC)    a. Dx in late 1990's - no known recurrence.   Sleep  disorder    Thyroid disease     Past Surgical History:  Procedure Laterality Date   CERVICAL CONE BIOPSY  1960's   CHOLECYSTECTOMY     NASAL POLYP SURGERY  01/01   STRABISMUS SURGERY  1946 / 1967   VAGINAL DELIVERY     x2    Family History  Problem Relation Age of Onset   Asthma Mother    Coronary artery disease Father     Social History   Socioeconomic History   Marital status: Married    Spouse name: Not on file   Number of children: 2   Years of education: Not on file   Highest education level: Not on file  Occupational History    Employer: RETIRED  Tobacco Use   Smoking status: Never    Passive exposure: Never   Smokeless tobacco: Never  Vaping Use   Vaping Use: Never used  Substance and Sexual Activity   Alcohol use: Yes    Alcohol/week: 1.0 standard drink    Types: 1 Glasses of wine per week    Comment: 1 glass of wine per day   Drug use: No   Sexual activity: Not Currently  Other Topics Concern   Not on file  Social History Narrative   Has living will   Not sure about formal POA --but requests husband, then daughter [name] (and son [name]) to be health care POAs   Would accept resuscitation attempts   Not sure about feeding tubes   Social Determinants of Health   Financial Resource Strain: Not on file  Food Insecurity: Not on file  Transportation Needs: Not on file  Physical Activity: Not on file  Stress: Not on file  Social Connections: Not on file  Intimate Partner Violence: Not on file   Review of Systems Appetite is good Weight is stable     Objective:   Physical Exam Constitutional:      Appearance: Normal appearance.  Neurological:     Mental Status: She is alert.  Psychiatric:        Mood and Affect: Mood normal.        Behavior: Behavior normal.           Assessment & Plan:

## 2022-01-02 LAB — QUANTIFERON-TB GOLD PLUS
Mitogen-NIL: >10 IU/mL
NIL: 0.06 IU/mL
QuantiFERON-TB Gold Plus: POSITIVE — AB
TB1-NIL: 0.79 IU/mL
TB2-NIL: 1.07 IU/mL

## 2022-01-03 ENCOUNTER — Other Ambulatory Visit: Payer: Self-pay | Admitting: Internal Medicine

## 2022-01-03 DIAGNOSIS — R7612 Nonspecific reaction to cell mediated immunity measurement of gamma interferon antigen response without active tuberculosis: Secondary | ICD-10-CM

## 2022-01-03 NOTE — Progress Notes (Signed)
Cardiology Office Note  Date:  01/04/2022   ID:  [name], Hannah 1930/12/10, MRN [id number]  PCP:  Hannah Carbon, MD   Chief Complaint  Patient presents with   12 month follow up    Patient c/o shortness of breath with over exertion and fatigue. Medications reviewed by the patient verbally.     HPI:  [name] is a 88 y.o. female with a hx of paroxysmal atrial fibrillation. diagnosed 23 years ago, age 3 In hospital ,lasted 1 week,  none since then ("heart was feeling really weird") chadsvasc 4, on asa, does not want NOAC HTN Hyperlipidemia DM II, followed by Dr. Eddie Dibbles,  Who presents for routine follow-up of her palpitations/atrial fibrillation  Daughter presents with her today Last seen in clinic by myself feb 2022  Reports having low bit of shortness of breath, took her albuterol this morning with improvement of her symptoms Feels she needs to do this in the fall in the spring with allergies  No regular exercise program Husband with covid 2022, medical issues In SNF She is going to sell her house in [place], moving to this facility in [place]  Denies any chest pain or shortness of breath, no leg edema Denies any tachypalpitations concerning for arrhythmia  Lab work reviewed with her HBA1C 7.6 Total chol 109, LDL 27  EKG personally reviewed by myself on todays visit Shows normal sinus rhythm with rate 76 bpm no significant ST or T wave changes  Prior dizzy episodes 2020 CT and MRI head Severe stenosis of right posterior artery Diffuse atherosclerosis.  On aspirin  Echo 06/2016: Normal EF >55%, normal LA size Event monitor 06/2016: NSR, no atrial fib  episode of chest pain, in the ED 07/2016   CT chest  2015: Atherosclerotic calcification aorta and minimally in coronary arteries.   PMH:   has a past medical history of Allergy, Asthma, Diabetes mellitus, Diastolic dysfunction, Essential hypertension, GERD (gastroesophageal reflux disease),  Hyperlipidemia, Mixed hyperlipidemia, Osteopenia, PAF (paroxysmal atrial fibrillation) ([place]), Sleep disorder, and Thyroid disease.  PSH:    Past Surgical History:  Procedure Laterality Date   CERVICAL CONE BIOPSY  1960's   CHOLECYSTECTOMY     NASAL POLYP SURGERY  01/01   STRABISMUS SURGERY  1946 / 1967   VAGINAL DELIVERY     x2    Current Outpatient Medications  Medication Sig Dispense Refill   albuterol (VENTOLIN HFA) 108 (90 Base) MCG/ACT inhaler Inhale 2 puffs into the lungs every 6 (six) hours as needed. 18 g 1   ALPRAZolam (XANAX) 0.25 MG tablet TAKE 1 TABLET(0.25 MG) BY MOUTH TWICE DAILY AS NEEDED FOR ANXIETY 60 tablet 0   aspirin 81 MG tablet Take 81 mg by mouth daily.     atorvastatin (LIPITOR) 20 MG tablet TAKE 1 TABLET BY MOUTH EVERY DAY 90 tablet 3   calcitonin, salmon, (MIACALCIN/FORTICAL) 200 UNIT/ACT nasal spray USE 1 SPRAY NASALLY EVERY DAY( ALTERNATING NOSTRILS) 3.7 mL 11   CALCIUM-VITAMIN D PO Take 1 tablet by mouth daily.     cetirizine (ZYRTEC) 1 MG/ML syrup Take 10 mg by mouth daily.     Chromium 100 MCG TABS Take by mouth daily.     Coenzyme Q10 200 MG capsule Take 200 mg by mouth daily.     Cranberry-Vitamin C-Vitamin E 140-100-3 MG-MG-UNIT CAPS Take 1 capsule by mouth daily.     Cysteamine Bitartrate (PROCYSBI) 300 MG PACK 4 (four) times daily     fish oil-omega-3 fatty acids  1000 MG capsule Take 1 g by mouth daily.     fluticasone (FLONASE) 50 MCG/ACT nasal spray Place 2 sprays into both nostrils daily. 16 g 11   folic acid (FOLVITE) 1 MG tablet Take 1 mg by mouth daily.     gentamicin ointment (GARAMYCIN) 0.1 % Apply topically.     HYDROcodone bit-homatropine (HYCODAN) 5-1.5 MG/5ML syrup Take 5 mLs by mouth at bedtime as needed for cough. 120 mL 0   insulin glargine (LANTUS SOLOSTAR) 100 UNIT/ML Solostar Pen Inject into the skin.     insulin glargine (LANTUS) 100 UNIT/ML Solostar Pen 26 Units.     levothyroxine (SYNTHROID, LEVOTHROID) 100 MCG tablet Take 100  mcg by mouth daily before breakfast.     meclizine (ANTIVERT) 25 MG tablet Take 1 tablet (25 mg total) by mouth 3 (three) times daily as needed. 90 tablet 0   metFORMIN (GLUCOPHAGE-XR) 500 MG 24 hr tablet Take 1 tablet by mouth 2 (two) times daily before a meal.     metoprolol succinate (TOPROL-XL) 50 MG 24 hr tablet TAKE 1 TABLET(50 MG) BY MOUTH DAILY 90 tablet 3   Milk Thistle 500 MG CAPS Take 1 capsule by mouth daily.     montelukast (SINGULAIR) 10 MG tablet TAKE 1 TABLET BY MOUTH EVERY NIGHT AT BEDTIME 90 tablet 3   Multiple Vitamin (MULTIVITAMIN) tablet Take 1 tablet by mouth daily.     omeprazole (PRILOSEC) 20 MG capsule TAKE 1 CAPSULE BY MOUTH TWICE DAILY BEFORE A MEAL 180 capsule 3   vitamin B-12 (CYANOCOBALAMIN) 1000 MCG tablet Take 1,000 mcg by mouth daily.     vitamin C (ASCORBIC ACID) 500 MG tablet Take 500 mg by mouth daily.     ACCU-CHEK SMARTVIEW test strip TEST BLOOD SUGAR BID UTD (Patient not taking: Reported on 01/04/2022)  3   ALPRAZolam (XANAX) 0.25 MG tablet Take by mouth. (Patient not taking: Reported on 01/04/2022)     B-D UF III MINI PEN NEEDLES 31G X 5 MM MISC USE ONCE TO TWICE DAILY (Patient not taking: Reported on 01/04/2022) 200 each 4   B-D ULTRA-FINE 33 LANCETS MISC Use to test blood sugar twice daily dx: E11.9 (Patient not taking: Reported on 01/04/2022) 200 each 3   metFORMIN (GLUCOPHAGE-XR) 500 MG 24 hr tablet Take by mouth. (Patient not taking: Reported on 01/04/2022)     Spacer/Aero-Holding Chambers (VALVED HOLDING CHAMBER) DEVI  (Patient not taking: Reported on 01/04/2022)     No current facility-administered medications for this visit.     Allergies:   Cefdinir, Ciprofloxacin, Clarithromycin, Miglitol, Niacin, and Qvar [beclomethasone]   Social History:  The patient  reports that she has never smoked. She has never been exposed to tobacco smoke. She has never used smokeless tobacco. She reports current alcohol use of about 1.0 standard drink per week. She reports  that she does not use drugs.   Family History:   family history includes Asthma in her mother; Coronary artery disease in her father.    Review of Systems: Review of Systems  Constitutional: Negative.   Respiratory: Negative.    Cardiovascular: Negative.   Gastrointestinal: Negative.   Musculoskeletal: Negative.   Neurological: Negative.   Psychiatric/Behavioral: Negative.    All other systems reviewed and are negative.  PHYSICAL EXAM: VS:  BP 140/60 (BP Location: Left Arm, Patient Position: Sitting, Cuff Size: Normal)    Pulse 76    Ht 5\' 3"  (1.6 m)    Wt 175 lb 6 oz (79.5 kg)  SpO2 98%    BMI 31.07 kg/m  , BMI Body mass index is 31.07 kg/m. Constitutional:  oriented to person, place, and time. No distress.  HENT:  Head: Grossly normal Eyes:  no discharge. No scleral icterus.  Neck: No JVD, no carotid bruits  Cardiovascular: Regular rate and rhythm, no murmurs appreciated Pulmonary/Chest: Clear to auscultation bilaterally, no wheezes or rails Abdominal: Soft.  no distension.  no tenderness.  Musculoskeletal: Normal range of motion Neurological:  normal muscle tone. Coordination normal. No atrophy Skin: Skin warm and dry Psychiatric: normal affect, pleasant  Recent Labs: 09/09/2021: ALT 17; TSH 2.35 09/25/2021: BUN 18; Creatinine, Ser 0.76; Hemoglobin 13.2; Platelets 240; Potassium 4.3; Sodium 130    Lipid Panel Lab Results  Component Value Date   CHOL 124 09/09/2021   HDL 51.90 09/09/2021   LDLCALC 25 02/05/2018   TRIG 223.0 (H) 09/09/2021     Wt Readings from Last 3 Encounters:  01/04/22 175 lb 6 oz (79.5 kg)  12/30/21 175 lb (79.4 kg)  11/04/21 174 lb (78.9 kg)     ASSESSMENT AND PLAN:  Paroxysmal atrial fibrillation (HCC) episode >20 years ago Maintaining NSR  No recent afib On asa, did not want NOAC  Essential hypertension Blood pressure is well controlled on today's visit. No changes made to the medications.  Mixed hyperlipidemia Cholesterol  is at goal on the current lipid regimen. No changes to the medications were made.  Cerebrovascular disease On aspirin, cholesterol at goal  Aortic atherosclerosis (HCC) Seen on CT scan Cholesterol at goal  Diabetes: HGBA1C 7.6 up to 8.4 Moderate diet  Stress/adjustment disorder Selling her house, moving to [facility] to be near her husband in a facility   Total encounter time more than 25 minutes  Greater than 50% was spent in counseling and coordination of care with the patient     No orders of the defined types were placed in this encounter.    Signed, Esmond Plants, M.D., Ph.D. 01/04/2022  [place], [place]

## 2022-01-04 ENCOUNTER — Other Ambulatory Visit: Payer: Self-pay

## 2022-01-04 ENCOUNTER — Telehealth: Payer: Self-pay | Admitting: Internal Medicine

## 2022-01-04 ENCOUNTER — Ambulatory Visit: Payer: Medicare PPO | Admitting: Cardiovascular Disease

## 2022-01-04 ENCOUNTER — Encounter: Payer: Self-pay | Admitting: Cardiovascular Disease

## 2022-01-04 VITALS — BP 140/60 | HR 76 | Ht 63.0 in | Wt 175.4 lb

## 2022-01-04 DIAGNOSIS — E782 Mixed hyperlipidemia: Secondary | ICD-10-CM

## 2022-01-04 DIAGNOSIS — I700 Atherosclerosis of aorta: Secondary | ICD-10-CM

## 2022-01-04 DIAGNOSIS — I10 Essential (primary) hypertension: Secondary | ICD-10-CM

## 2022-01-04 DIAGNOSIS — I480 Paroxysmal atrial fibrillation: Secondary | ICD-10-CM

## 2022-01-04 DIAGNOSIS — Z0279 Encounter for issue of other medical certificate: Secondary | ICD-10-CM

## 2022-01-04 NOTE — Telephone Encounter (Signed)
Please call pt's daughter, [name], to set up CXR here. Thanks

## 2022-01-04 NOTE — Patient Instructions (Signed)
Medication Instructions:  No new medications  *If you need a refill on your cardiac medications before your next appointment, please call your pharmacy*  Lab Work: No labs  Testing/Procedures: No new testing  Follow-Up: At [facility], you and your health needs are our priority.  As part of our continuing mission to provide you with exceptional heart care, we have created designated Provider Care Teams.  These Care Teams include your primary Cardiologist (physician) and Advanced Practice Providers (APPs -  Physician Assistants and Nurse Practitioners) who all work together to provide you with the care you need, when you need it.  Your next appointment:   12 month(s)  The format for your next appointment:   In Person  Provider:   You may see Julien Nordmann, MD or one of the following Advanced Practice Providers on your designated Care Team:   Nicolasa Ducking, NP Eula Listen, PA-C Cadence Fransico Michael, [place]

## 2022-01-04 NOTE — Telephone Encounter (Signed)
Mrs. [name] called in due to her dad called her and told her that Mrs. [name] was positive for TB and needed to gt a chest x-ray but he is in a rehab facility and she wanted to know what is going on.

## 2022-01-04 NOTE — Telephone Encounter (Signed)
[name], CMA  01/04/2022  3:59 PM EST Back to Top    Called patient but number just rings and then asked for access code. Called husbands number and gave him results; okay per DPR. He will have patient call back tomorrow and set up appt for chest xray.    [name], MD  01/03/2022  1:09 PM EST     Please set her up here for a CXR      Patient Communication  Edit Comments   Add Notifications  Back to Top   Well---the TB blood test was positive, so we need to go ahead with a chest x-ray. Carollee Herter will call about setting this up

## 2022-01-05 NOTE — Telephone Encounter (Signed)
Mrs. [name] called in to get her mom results.

## 2022-01-05 NOTE — Telephone Encounter (Signed)
Left message on VM for [name] that the only thing I know is that the TB lab came back positive and we need to do a Chest xray to check and see if she is active. If she calls back, please schedule pt for a xray at our office.

## 2022-01-06 ENCOUNTER — Ambulatory Visit (INDEPENDENT_AMBULATORY_CARE_PROVIDER_SITE_OTHER)
Admission: RE | Admit: 2022-01-06 | Discharge: 2022-01-06 | Disposition: A | Discharge status: Home / Self Care | Payer: Medicare PPO | Source: Ambulatory Visit | Attending: Internal Medicine | Admitting: Internal Medicine

## 2022-01-06 ENCOUNTER — Other Ambulatory Visit: Payer: Self-pay

## 2022-01-06 ENCOUNTER — Other Ambulatory Visit: Payer: Medicare PPO

## 2022-01-06 DIAGNOSIS — R7612 Nonspecific reaction to cell mediated immunity measurement of gamma interferon antigen response without active tuberculosis: Secondary | ICD-10-CM | POA: Diagnosis not present

## 2022-01-07 ENCOUNTER — Telehealth: Payer: Self-pay | Admitting: Internal Medicine

## 2022-01-07 MED ORDER — MONTELUKAST SODIUM 10 MG PO TABS: 10.0000 mg | ORAL_TABLET | Freq: Every day | ORAL | 3 refills | Status: DC

## 2022-01-07 NOTE — Telephone Encounter (Signed)
°  Encourage patient to contact the pharmacy for refills or they can request refills through [place]:  Please schedule appointment if longer than 1 year  NEXT APPOINTMENT DATE:  MEDICATION: montelukast (SINGULAIR) 10 MG tablet  Is the patient out of medication? yes  PHARMACY: [facility]  Let patient know to contact pharmacy at the end of the day to make sure medication is ready.  Please notify patient to allow 48-72 hours to process  CLINICAL FILLS OUT ALL BELOW:   LAST REFILL:  QTY:  REFILL DATE:    OTHER COMMENTS:    Okay for refill?  Please advise

## 2022-01-07 NOTE — Addendum Note (Signed)
Addended by: Eual Fines on: 01/07/2022 02:13 PM   Modules accepted: Orders

## 2022-01-07 NOTE — Telephone Encounter (Signed)
Rx sent electronically.  

## 2022-01-07 NOTE — Telephone Encounter (Signed)
Mrs. [name] called in and wanted to know if the x-ray could be read due to she is due to move in to an assistant living next week

## 2022-01-15 DIAGNOSIS — E039 Hypothyroidism, unspecified: Secondary | ICD-10-CM | POA: Diagnosis not present

## 2022-01-15 DIAGNOSIS — Z87898 Personal history of other specified conditions: Secondary | ICD-10-CM | POA: Diagnosis not present

## 2022-01-15 DIAGNOSIS — Z8679 Personal history of other diseases of the circulatory system: Secondary | ICD-10-CM | POA: Diagnosis not present

## 2022-01-15 DIAGNOSIS — E538 Deficiency of other specified B group vitamins: Secondary | ICD-10-CM | POA: Diagnosis not present

## 2022-01-15 DIAGNOSIS — I700 Atherosclerosis of aorta: Secondary | ICD-10-CM | POA: Diagnosis not present

## 2022-01-15 DIAGNOSIS — I152 Hypertension secondary to endocrine disorders: Secondary | ICD-10-CM | POA: Diagnosis not present

## 2022-01-15 DIAGNOSIS — J45909 Unspecified asthma, uncomplicated: Secondary | ICD-10-CM | POA: Diagnosis not present

## 2022-01-15 DIAGNOSIS — E1159 Type 2 diabetes mellitus with other circulatory complications: Secondary | ICD-10-CM | POA: Diagnosis not present

## 2022-01-15 DIAGNOSIS — K219 Gastro-esophageal reflux disease without esophagitis: Secondary | ICD-10-CM | POA: Diagnosis not present

## 2022-01-20 ENCOUNTER — Ambulatory Visit: Payer: Medicare PPO | Admitting: Family Medicine

## 2022-01-20 ENCOUNTER — Other Ambulatory Visit: Payer: Self-pay

## 2022-01-20 ENCOUNTER — Encounter: Payer: Self-pay | Admitting: Family Medicine

## 2022-01-20 VITALS — BP 130/80 | HR 71 | Temp 97.1°F | Ht 63.0 in | Wt 173.0 lb

## 2022-01-20 DIAGNOSIS — M19041 Primary osteoarthritis, right hand: Secondary | ICD-10-CM

## 2022-01-20 DIAGNOSIS — M79601 Pain in right arm: Secondary | ICD-10-CM

## 2022-01-20 DIAGNOSIS — M778 Other enthesopathies, not elsewhere classified: Secondary | ICD-10-CM

## 2022-01-20 DIAGNOSIS — M79641 Pain in right hand: Secondary | ICD-10-CM | POA: Diagnosis not present

## 2022-01-20 MED ORDER — PREDNISONE 20 MG PO TABS: ORAL_TABLET | ORAL | 0 refills | Status: DC

## 2022-01-20 NOTE — Progress Notes (Signed)
? ? ?Stefania Goulart T. Porscha Axley, MD, CAQ Sports Medicine ?Nature conservation officer at [facility] ?[address] [place] ?[place], [address] ? ?Phone: [phone]  FAX: [phone] ? ?[name] - 88 y.o. female  MRN [id number]  Date of Birth: [birth year] ? ?Date: 01/20/2022  PCP: [name], MD  Referral: [name], MD ? ?Chief Complaint  ?Patient presents with  ? Right Hand Pain and Swelling  ?  Feels like a nerve that goes from her back to her fingers. Can't make a fist.  ? ? ?This visit occurred during the SARS-CoV-2 public health emergency.  Safety protocols were in place, including screening questions prior to the visit, additional usage of staff PPE, and extensive cleaning of exam room while observing appropriate contact time as indicated for disinfecting solutions.  ? ?Subjective:  ? ?[name] is a 88 y.o. very pleasant female patient with Body mass index is 30.65 kg/m?. who presents with the following: ? ?She presents for an acute evaluation of some right-sided hand pain and swelling. ? ?She presents with some volar arm pain this week, some swelling, as well as some pain with movement of her digits.  She also has had some mild swelling of her fingers.  She is having some pain making a grip.  She also is having some tingling in the hand and fingers as well. ? ?She denies any specific trauma or injury.  Her daughter also provides additional history, and later it comes up that she and her husband were moving within the last week, and she was moving things a great deal more than she typically would have. ? ?She does also have a history of an injury is a 30-year-old with a contracture across the entirety of this hand ? ?Even 2 weeks ago, could make a fist.  ? ?Lab Results  ?Component Value Date  ? HGBA1C 7.9 08/15/2019  ?  ? ? ?Review of Systems is noted in the HPI, as appropriate ? ?Objective:  ? ?BP 130/80 (BP Location: Left Arm, Patient Position: Sitting, Cuff Size: Normal)   Pulse 71    Temp (!) 97.1 ?F (36.2 ?C)   Ht 5\' 3"  (1.6 m)   Wt 173 lb (78.5 kg)   SpO2 96%   BMI 30.65 kg/m?  ? ?GEN: No acute distress; alert,appropriate. ?PULM: Breathing comfortably in no respiratory distress ?PSYCH: Normally interactive.  ? ?Multiple finger joints are modestly tender to palpation. ?She does have some swelling in the distal anterior forearm and modestly in the hand. ?She does have some tenderness along the flexor tendon sheaths of most of the digits.  There is also some mild tenderness in the true wrist joint. ?Extensor movements of the fingers do not cause pain. ? ?Laboratory and Imaging Data: ? ?Assessment and Plan:  ? ?  ICD-10-CM   ?1. Arm pain, anterior, right  M79.601   ?  ?2. Right hand pain  M79.641   ?  ?3. Right hand tendonitis  M77.8   ?  ?4. Arthritis of right hand  M19.041   ?  ? ?Hand and wrist osteoarthritis with exacerbation ?Multiple flexor tendon tendons with some overuse and tenosynovitis on exam. ?Modest tingling in the hand and fingers that is new would almost certainly be coming from some overuse and swelling compressing the median and ulnar nerve. ? ?I would anticipate that she will do well.  I am gonna place her on a short course of some steroids, and she is going to use  some ice on her hands and use them a little bit less frequently. ? ?Meds ordered this encounter  ?Medications  ? predniSONE (DELTASONE) 20 MG tablet  ?  Sig: 2 tabs po for 4 days, then 1 tab po for 4 days  ?  Dispense:  12 tablet  ?  Refill:  0  ? ?There are no discontinued medications. ?No orders of the defined types were placed in this encounter. ? ? ?Follow-up: No follow-ups on file. ? ?Dragon [employer] One speech-to-text software was used for transcription in this dictation.  Possible transcriptional errors can occur using Animal nutritionist.  ? ?Signed, ? ?Kawthar Ennen T. Deleah Tison, MD ? ? ?Outpatient Encounter Medications as of 01/20/2022  ?Medication Sig  ? ACCU-CHEK SMARTVIEW test strip   ? albuterol (VENTOLIN HFA)  108 (90 Base) MCG/ACT inhaler Inhale 2 puffs into the lungs every 6 (six) hours as needed.  ? ALPRAZolam (XANAX) 0.25 MG tablet TAKE 1 TABLET(0.25 MG) BY MOUTH TWICE DAILY AS NEEDED FOR ANXIETY  ? ALPRAZolam (XANAX) 0.25 MG tablet Take by mouth.  ? aspirin 81 MG tablet Take 81 mg by mouth daily.  ? atorvastatin (LIPITOR) 20 MG tablet TAKE 1 TABLET BY MOUTH EVERY DAY  ? B-D UF III MINI PEN NEEDLES 31G X 5 MM MISC USE ONCE TO TWICE DAILY  ? B-D ULTRA-FINE 33 LANCETS MISC Use to test blood sugar twice daily dx: E11.9  ? calcitonin, salmon, (MIACALCIN/FORTICAL) 200 UNIT/ACT nasal spray USE 1 SPRAY NASALLY EVERY DAY( ALTERNATING NOSTRILS)  ? CALCIUM-VITAMIN D PO Take 1 tablet by mouth daily.  ? cetirizine (ZYRTEC) 1 MG/ML syrup Take 10 mg by mouth daily.  ? Chromium 100 MCG TABS Take by mouth daily.  ? Coenzyme Q10 200 MG capsule Take 200 mg by mouth daily.  ? Cranberry-Vitamin C-Vitamin E 140-100-3 MG-MG-UNIT CAPS Take 1 capsule by mouth daily.  ? Cysteamine Bitartrate (PROCYSBI) 300 MG PACK 4 (four) times daily  ? fish oil-omega-3 fatty acids 1000 MG capsule Take 1 g by mouth daily.  ? fluticasone (FLONASE) 50 MCG/ACT nasal spray Place 2 sprays into both nostrils daily.  ? folic acid (FOLVITE) 1 MG tablet Take 1 mg by mouth daily.  ? gentamicin ointment (GARAMYCIN) 0.1 % Apply topically.  ? HYDROcodone bit-homatropine (HYCODAN) 5-1.5 MG/5ML syrup Take 5 mLs by mouth at bedtime as needed for cough.  ? insulin glargine (LANTUS SOLOSTAR) 100 UNIT/ML Solostar Pen Inject into the skin.  ? insulin glargine (LANTUS) 100 UNIT/ML Solostar Pen 26 Units.  ? levothyroxine (SYNTHROID, LEVOTHROID) 100 MCG tablet Take 100 mcg by mouth daily before breakfast.  ? meclizine (ANTIVERT) 25 MG tablet Take 1 tablet (25 mg total) by mouth 3 (three) times daily as needed.  ? metFORMIN (GLUCOPHAGE-XR) 500 MG 24 hr tablet Take 1 tablet by mouth 2 (two) times daily before a meal.  ? metFORMIN (GLUCOPHAGE-XR) 500 MG 24 hr tablet Take by mouth.  ?  metoprolol succinate (TOPROL-XL) 50 MG 24 hr tablet TAKE 1 TABLET(50 MG) BY MOUTH DAILY  ? Milk Thistle 500 MG CAPS Take 1 capsule by mouth daily.  ? montelukast (SINGULAIR) 10 MG tablet Take 1 tablet (10 mg total) by mouth at bedtime.  ? Multiple Vitamin (MULTIVITAMIN) tablet Take 1 tablet by mouth daily.  ? omeprazole (PRILOSEC) 20 MG capsule TAKE 1 CAPSULE BY MOUTH TWICE DAILY BEFORE A MEAL  ? predniSONE (DELTASONE) 20 MG tablet 2 tabs po for 4 days, then 1 tab po for 4 days  ? Spacer/Aero-Holding Deretha Emory (  VALVED HOLDING CHAMBER) DEVI   ? vitamin B-12 (CYANOCOBALAMIN) 1000 MCG tablet Take 1,000 mcg by mouth daily.  ? vitamin C (ASCORBIC ACID) 500 MG tablet Take 500 mg by mouth daily.  ? ?No facility-administered encounter medications on file as of 01/20/2022.  ?  ?

## 2022-01-21 ENCOUNTER — Encounter: Payer: Self-pay | Admitting: Family Medicine

## 2022-01-25 DIAGNOSIS — E559 Vitamin D deficiency, unspecified: Secondary | ICD-10-CM | POA: Diagnosis not present

## 2022-01-25 DIAGNOSIS — I10 Essential (primary) hypertension: Secondary | ICD-10-CM | POA: Diagnosis not present

## 2022-01-25 DIAGNOSIS — E08311 Diabetes mellitus due to underlying condition with unspecified diabetic retinopathy with macular edema: Secondary | ICD-10-CM | POA: Diagnosis not present

## 2022-01-25 DIAGNOSIS — E039 Hypothyroidism, unspecified: Secondary | ICD-10-CM | POA: Diagnosis not present

## 2022-01-25 DIAGNOSIS — D519 Vitamin B12 deficiency anemia, unspecified: Secondary | ICD-10-CM | POA: Diagnosis not present

## 2022-02-16 ENCOUNTER — Encounter: Payer: Self-pay | Admitting: Internal Medicine

## 2022-02-16 ENCOUNTER — Ambulatory Visit (INDEPENDENT_AMBULATORY_CARE_PROVIDER_SITE_OTHER): Payer: Medicare PPO | Admitting: Internal Medicine

## 2022-02-16 VITALS — BP 140/82 | HR 82 | Temp 98.5°F | Ht 63.0 in | Wt 172.0 lb

## 2022-02-16 DIAGNOSIS — G5603 Carpal tunnel syndrome, bilateral upper limbs: Secondary | ICD-10-CM | POA: Insufficient documentation

## 2022-02-16 DIAGNOSIS — M79641 Pain in right hand: Secondary | ICD-10-CM | POA: Diagnosis not present

## 2022-02-16 NOTE — Progress Notes (Signed)
? ?Subjective:  ? ? Patient ID: [name], female    DOB: 08/10/1931, 88 y.o.   MRN: [id number] ? ?HPI ?Here for follow up of hand pain ?With daughter [name] ? ?Has scar on volar right hand---since age 89 ?No prior symptoms now---it had only been a cosmetic issue ?Recently started with swelling along volar right forearm ?Concerned she has something from her neck---pain down arm and then into 3 fingers---thumb, 2nd, 3rd ?Wonders if the scar is involved ? ?Started before moving into assisted living ?Has had trouble even combing hair, etc ?Significant pain so saw Dr Lorelei Pont ?Prednisone helped---but then pain recurred right away (messed up her sugars) ? ?Pain is mostly with gripping or lifting ?No pain when not using it ?Has tried tylenol--not helping ?Tried heat/cold---heat does help some ?Does notice it at night at times--xanax to help her sleep will help ?Hasn't awakened with symptoms ? ?Current Outpatient Medications on File Prior to Visit  ?Medication Sig Dispense Refill  ? ACCU-CHEK SMARTVIEW test strip   3  ? albuterol (VENTOLIN HFA) 108 (90 Base) MCG/ACT inhaler Inhale 2 puffs into the lungs every 6 (six) hours as needed. 18 g 1  ? ALPRAZolam (XANAX) 0.25 MG tablet TAKE 1 TABLET(0.25 MG) BY MOUTH TWICE DAILY AS NEEDED FOR ANXIETY 60 tablet 0  ? aspirin 81 MG tablet Take 81 mg by mouth daily.    ? atorvastatin (LIPITOR) 20 MG tablet TAKE 1 TABLET BY MOUTH EVERY DAY 90 tablet 3  ? B-D UF III MINI PEN NEEDLES 31G X 5 MM MISC USE ONCE TO TWICE DAILY 200 each 4  ? B-D ULTRA-FINE 33 LANCETS MISC Use to test blood sugar twice daily dx: E11.9 200 each 3  ? calcitonin, salmon, (MIACALCIN/FORTICAL) 200 UNIT/ACT nasal spray USE 1 SPRAY NASALLY EVERY DAY( ALTERNATING NOSTRILS) 3.7 mL 11  ? CALCIUM-VITAMIN D PO Take 1 tablet by mouth daily.    ? cetirizine (ZYRTEC) 1 MG/ML syrup Take 10 mg by mouth daily.    ? Chromium 100 MCG TABS Take by mouth daily.    ? Coenzyme Q10 200 MG capsule Take 200 mg by mouth daily.    ?  Cranberry-Vitamin C-Vitamin E 140-100-3 MG-MG-UNIT CAPS Take 1 capsule by mouth daily.    ? Cysteamine Bitartrate (PROCYSBI) 300 MG PACK 4 (four) times daily    ? fish oil-omega-3 fatty acids 1000 MG capsule Take 1 g by mouth daily.    ? fluticasone (FLONASE) 50 MCG/ACT nasal spray Place 2 sprays into both nostrils daily. 16 g 11  ? folic acid (FOLVITE) 1 MG tablet Take 1 mg by mouth daily.    ? gentamicin ointment (GARAMYCIN) 0.1 % Apply topically.    ? insulin glargine (LANTUS) 100 UNIT/ML Solostar Pen 26 Units.    ? levothyroxine (SYNTHROID, LEVOTHROID) 100 MCG tablet Take 100 mcg by mouth daily before breakfast.    ? meclizine (ANTIVERT) 25 MG tablet Take 1 tablet (25 mg total) by mouth 3 (three) times daily as needed. 90 tablet 0  ? metFORMIN (GLUCOPHAGE-XR) 500 MG 24 hr tablet Take 1 tablet by mouth 2 (two) times daily before a meal.    ? metoprolol succinate (TOPROL-XL) 50 MG 24 hr tablet TAKE 1 TABLET(50 MG) BY MOUTH DAILY 90 tablet 3  ? Milk Thistle 500 MG CAPS Take 1 capsule by mouth daily.    ? montelukast (SINGULAIR) 10 MG tablet Take 1 tablet (10 mg total) by mouth at bedtime. 90 tablet 3  ? Multiple Vitamin (  MULTIVITAMIN) tablet Take 1 tablet by mouth daily.    ? omeprazole (PRILOSEC) 20 MG capsule TAKE 1 CAPSULE BY MOUTH TWICE DAILY BEFORE A MEAL 180 capsule 3  ? Spacer/Aero-Holding Chambers (VALVED HOLDING CHAMBER) DEVI     ? vitamin B-12 (CYANOCOBALAMIN) 1000 MCG tablet Take 1,000 mcg by mouth daily.    ? vitamin C (ASCORBIC ACID) 500 MG tablet Take 500 mg by mouth daily.    ? ?No current facility-administered medications on file prior to visit.  ? ? ?Allergies  ?Allergen Reactions  ? Cefdinir   ?  REACTION: unspecified  ? Ciprofloxacin   ?  rash  ? Clarithromycin   ?  REACTION: weird dreams (on prednisone at the same time but has tolerated this in the past)  ? Miglitol   ?  REACTION: unspecified  ? Niacin   ?  REACTION: unspecified  ? Qvar [Beclomethasone] Other (See Comments)  ?  Feels  funny ?Feels funny  ? ? ?Past Medical History:  ?Diagnosis Date  ? Allergy   ? Asthma   ? Diabetes mellitus   ? Diastolic dysfunction   ? a. 06/2019 Echo: EF 60-65%, no rwma, GR1 DD. Mild MR. Nl RV fxn.  ? Essential hypertension   ? GERD (gastroesophageal reflux disease)   ? Hyperlipidemia   ? Mixed hyperlipidemia   ? Osteopenia   ? PAF (paroxysmal atrial fibrillation) ([place])   ? a. Dx in late 1990's - no known recurrence.  ? Sleep disorder   ? Thyroid disease   ? ? ?Past Surgical History:  ?Procedure Laterality Date  ? CERVICAL CONE BIOPSY  1960's  ? CHOLECYSTECTOMY    ? NASAL POLYP SURGERY  01/01  ? STRABISMUS SURGERY  1946 / 1967  ? VAGINAL DELIVERY    ? x2  ? ? ?Family History  ?Problem Relation Age of Onset  ? Asthma Mother   ? Coronary artery disease Father   ? ? ?Social History  ? ?Socioeconomic History  ? Marital status: Married  ?  Spouse name: Not on file  ? Number of children: 2  ? Years of education: Not on file  ? Highest education level: Not on file  ?Occupational History  ?  Employer: RETIRED  ?Tobacco Use  ? Smoking status: Never  ?  Passive exposure: Never  ? Smokeless tobacco: Never  ?Vaping Use  ? Vaping Use: Never used  ?Substance and Sexual Activity  ? Alcohol use: Yes  ?  Alcohol/week: 1.0 standard drink  ?  Types: 1 Glasses of wine per week  ?  Comment: 1 glass of wine per day  ? Drug use: No  ? Sexual activity: Not Currently  ?Other Topics Concern  ? Not on file  ?Social History Narrative  ? Has living will  ? Not sure about formal POA --but requests husband, then daughter [name] (and son [name]) to be health care POAs  ? Would accept resuscitation attempts  ? Not sure about feeding tubes  ? ?Social Determinants of Health  ? ?Financial Resource Strain: Not on file  ?Food Insecurity: Not on file  ?Transportation Needs: Not on file  ?Physical Activity: Not on file  ?Stress: Not on file  ?Social Connections: Not on file  ?Intimate Partner Violence: Not on file  ? ?Review of Systems ?Still having a  hard time grieving--husband just died ?   ?Objective:  ? Physical Exam ?Musculoskeletal:  ?   Comments: Right palmar scar ?Very limited grip on right, mild limitations on  left ?Tenderness over volar wrist and up forearm ?No muscle wasting and interosseous muscle function seems intact  ?  ? ? ? ? ?   ?Assessment & Plan:  ? ?

## 2022-02-16 NOTE — Assessment & Plan Note (Signed)
Confusing picture ?Could be nerve related---?radial ?Doubt cervical spine etiology ??tendonitis ? ?Will set up with hand surgeon ?Trial diclofenac gel over painful areas for now ?

## 2022-02-28 ENCOUNTER — Other Ambulatory Visit: Payer: Self-pay | Admitting: Internal Medicine

## 2022-02-28 NOTE — Telephone Encounter (Signed)
Is this okay for to refill / ?

## 2022-03-01 ENCOUNTER — Encounter: Payer: Self-pay | Admitting: *Deleted

## 2022-03-03 DIAGNOSIS — M79641 Pain in right hand: Secondary | ICD-10-CM | POA: Diagnosis not present

## 2022-03-03 DIAGNOSIS — R531 Weakness: Secondary | ICD-10-CM | POA: Diagnosis not present

## 2022-03-03 DIAGNOSIS — R202 Paresthesia of skin: Secondary | ICD-10-CM | POA: Diagnosis not present

## 2022-03-03 DIAGNOSIS — E1165 Type 2 diabetes mellitus with hyperglycemia: Secondary | ICD-10-CM | POA: Diagnosis not present

## 2022-03-11 DIAGNOSIS — E1159 Type 2 diabetes mellitus with other circulatory complications: Secondary | ICD-10-CM | POA: Diagnosis not present

## 2022-03-11 DIAGNOSIS — E1169 Type 2 diabetes mellitus with other specified complication: Secondary | ICD-10-CM | POA: Diagnosis not present

## 2022-03-11 DIAGNOSIS — I152 Hypertension secondary to endocrine disorders: Secondary | ICD-10-CM | POA: Diagnosis not present

## 2022-03-11 DIAGNOSIS — E669 Obesity, unspecified: Secondary | ICD-10-CM | POA: Diagnosis not present

## 2022-03-11 DIAGNOSIS — Z794 Long term (current) use of insulin: Secondary | ICD-10-CM | POA: Diagnosis not present

## 2022-03-11 DIAGNOSIS — E1165 Type 2 diabetes mellitus with hyperglycemia: Secondary | ICD-10-CM | POA: Diagnosis not present

## 2022-03-11 DIAGNOSIS — E113293 Type 2 diabetes mellitus with mild nonproliferative diabetic retinopathy without macular edema, bilateral: Secondary | ICD-10-CM | POA: Diagnosis not present

## 2022-03-11 DIAGNOSIS — E063 Autoimmune thyroiditis: Secondary | ICD-10-CM | POA: Diagnosis not present

## 2022-03-16 DIAGNOSIS — R200 Anesthesia of skin: Secondary | ICD-10-CM | POA: Diagnosis not present

## 2022-03-23 DIAGNOSIS — R200 Anesthesia of skin: Secondary | ICD-10-CM | POA: Diagnosis not present

## 2022-03-23 DIAGNOSIS — R202 Paresthesia of skin: Secondary | ICD-10-CM | POA: Diagnosis not present

## 2022-03-31 DIAGNOSIS — M5134 Other intervertebral disc degeneration, thoracic region: Secondary | ICD-10-CM | POA: Diagnosis not present

## 2022-03-31 DIAGNOSIS — M50322 Other cervical disc degeneration at C5-C6 level: Secondary | ICD-10-CM | POA: Diagnosis not present

## 2022-03-31 DIAGNOSIS — M4125 Other idiopathic scoliosis, thoracolumbar region: Secondary | ICD-10-CM | POA: Diagnosis not present

## 2022-03-31 DIAGNOSIS — M546 Pain in thoracic spine: Secondary | ICD-10-CM | POA: Diagnosis not present

## 2022-03-31 DIAGNOSIS — R200 Anesthesia of skin: Secondary | ICD-10-CM | POA: Diagnosis not present

## 2022-03-31 DIAGNOSIS — M50321 Other cervical disc degeneration at C4-C5 level: Secondary | ICD-10-CM | POA: Diagnosis not present

## 2022-03-31 DIAGNOSIS — M5030 Other cervical disc degeneration, unspecified cervical region: Secondary | ICD-10-CM | POA: Diagnosis not present

## 2022-03-31 DIAGNOSIS — R202 Paresthesia of skin: Secondary | ICD-10-CM | POA: Diagnosis not present

## 2022-03-31 DIAGNOSIS — G8929 Other chronic pain: Secondary | ICD-10-CM | POA: Diagnosis not present

## 2022-03-31 DIAGNOSIS — M50323 Other cervical disc degeneration at C6-C7 level: Secondary | ICD-10-CM | POA: Diagnosis not present

## 2022-03-31 DIAGNOSIS — M47812 Spondylosis without myelopathy or radiculopathy, cervical region: Secondary | ICD-10-CM | POA: Diagnosis not present

## 2022-04-05 ENCOUNTER — Telehealth: Payer: Self-pay | Admitting: Internal Medicine

## 2022-04-05 ENCOUNTER — Encounter: Payer: Self-pay | Admitting: Internal Medicine

## 2022-04-05 ENCOUNTER — Ambulatory Visit (INDEPENDENT_AMBULATORY_CARE_PROVIDER_SITE_OTHER): Payer: Medicare PPO | Admitting: Internal Medicine

## 2022-04-05 DIAGNOSIS — Z794 Long term (current) use of insulin: Secondary | ICD-10-CM

## 2022-04-05 DIAGNOSIS — E083293 Diabetes mellitus due to underlying condition with mild nonproliferative diabetic retinopathy without macular edema, bilateral: Secondary | ICD-10-CM

## 2022-04-05 DIAGNOSIS — G5603 Carpal tunnel syndrome, bilateral upper limbs: Secondary | ICD-10-CM | POA: Diagnosis not present

## 2022-04-05 MED ORDER — ONDANSETRON HCL 4 MG PO TABS: 4.0000 mg | ORAL_TABLET | Freq: Three times a day (TID) | ORAL | 0 refills | Status: DC | PRN

## 2022-04-05 NOTE — Telephone Encounter (Signed)
Thomasene Mohair (Daughter) called stating "she wanted to speak to a nurse about her mom having a reaction to a medication, did not say what medication." I transferred her to Access Nurse to speak with someone. Daughter was speaking really fast and was kind of hard to understand her.   Callback Number: [phone]

## 2022-04-05 NOTE — Assessment & Plan Note (Signed)
Too soon to repeat A1c on insulin 26 and metformin 1000 bid

## 2022-04-05 NOTE — Progress Notes (Signed)
Subjective:    Patient ID: [name], female    DOB: 02-Jul-1931, 88 y.o.   MRN: [id number]  HPI Here with daughter due to problems with multiple meds for nerve pain  Had NCV testing on right arm Diagnosed with carpal tunnel based on this (with neurologist) Had spread to other arm as well Sent to orthopedics Blood work showed A1c up to 9.4%---so went back to Dr Tedd Sias Insulin had been decreased and proposed increase in metformin didn't happen (Now insulin 26 units and metformin 1000 bid---numbers improving with this)  Had steroid injection in both wrists--helped some (but raised sugars) Wrist splints at night not helping Considering surgery for this  Ongoing pain affecting her sleep Put on gabapentin 4/20----100mg  bid and then to 200 bid. Didn't feel good on this--nausea, not eating, etc. Didn't help sleep either. Stopped gabapentin Then started pregabalin--no help even as dose increased---so stopped Next visit to ortho--started on duloxetine 30. Started 4 days ago Some nausea and balance issues on this--so has just stopped the duloxetine  Hands are better with pain--if holds in right position Having some swelling and weakness  Current Outpatient Medications on File Prior to Visit  Medication Sig Dispense Refill   ACCU-CHEK SMARTVIEW test strip   3   albuterol (VENTOLIN HFA) 108 (90 Base) MCG/ACT inhaler Inhale 2 puffs into the lungs every 6 (six) hours as needed. 18 g 1   ALPRAZolam (XANAX) 0.25 MG tablet TAKE 1 TABLET(0.25 MG) BY MOUTH TWICE DAILY AS NEEDED FOR ANXIETY 60 tablet 0   aspirin 81 MG tablet Take 81 mg by mouth daily.     atorvastatin (LIPITOR) 20 MG tablet TAKE 1 TABLET BY MOUTH EVERY DAY 90 tablet 3   B-D UF III MINI PEN NEEDLES 31G X 5 MM MISC USE ONCE TO TWICE DAILY 200 each 4   B-D ULTRA-FINE 33 LANCETS MISC Use to test blood sugar twice daily dx: E11.9 200 each 3   calcitonin, salmon, (MIACALCIN/FORTICAL) 200 UNIT/ACT nasal spray USE 1 SPRAY NASALLY  EVERY DAY( ALTERNATING NOSTRILS) 3.7 mL 11   cetirizine (ZYRTEC) 1 MG/ML syrup Take 10 mg by mouth daily.     fluticasone (FLONASE) 50 MCG/ACT nasal spray Place 2 sprays into both nostrils daily. (Patient taking differently: Place 2 sprays into both nostrils daily. As needed) 16 g 11   insulin glargine (LANTUS) 100 UNIT/ML Solostar Pen 26 Units.     levothyroxine (SYNTHROID, LEVOTHROID) 100 MCG tablet Take 100 mcg by mouth daily before breakfast.     MAGNESIUM PO Take by mouth.     meclizine (ANTIVERT) 25 MG tablet Take 1 tablet (25 mg total) by mouth 3 (three) times daily as needed. 90 tablet 0   metFORMIN (GLUCOPHAGE-XR) 500 MG 24 hr tablet Take 1 tablet by mouth 2 (two) times daily before a meal.     metoprolol succinate (TOPROL-XL) 50 MG 24 hr tablet TAKE 1 TABLET(50 MG) BY MOUTH DAILY 90 tablet 3   montelukast (SINGULAIR) 10 MG tablet Take 1 tablet (10 mg total) by mouth at bedtime. 90 tablet 3   Multiple Vitamin (MULTIVITAMIN) tablet Take 1 tablet by mouth daily.     Multiple Vitamins-Minerals (OCUVITE PO) Take by mouth.     omeprazole (PRILOSEC) 20 MG capsule TAKE 1 CAPSULE BY MOUTH TWICE DAILY BEFORE A MEAL 180 capsule 3   Spacer/Aero-Holding Chambers (VALVED HOLDING CHAMBER) DEVI      TURMERIC PO Take by mouth.     vitamin C (ASCORBIC ACID) 500  MG tablet Take 500 mg by mouth daily.     No current facility-administered medications on file prior to visit.    Allergies  Allergen Reactions   Cefdinir     REACTION: unspecified   Ciprofloxacin     rash   Clarithromycin     REACTION: weird dreams (on prednisone at the same time but has tolerated this in the past)   Miglitol     REACTION: unspecified   Niacin     REACTION: unspecified   Qvar [Beclomethasone] Other (See Comments)    Feels funny Feels funny    Past Medical History:  Diagnosis Date   Allergy    Asthma    Diabetes mellitus    Diastolic dysfunction    a. 06/2019 Echo: EF 60-65%, no rwma, GR1 DD. Mild MR. Nl RV  fxn.   Essential hypertension    GERD (gastroesophageal reflux disease)    Hyperlipidemia    Mixed hyperlipidemia    Osteopenia    PAF (paroxysmal atrial fibrillation) ([facility])    a. Dx in late 1990's - no known recurrence.   Sleep disorder    Thyroid disease     Past Surgical History:  Procedure Laterality Date   CERVICAL CONE BIOPSY  1960's   CHOLECYSTECTOMY     NASAL POLYP SURGERY  01/01   STRABISMUS SURGERY  1946 / 1967   VAGINAL DELIVERY     x2    Family History  Problem Relation Age of Onset   Asthma Mother    Coronary artery disease Father     Social History   Socioeconomic History   Marital status: Widowed    Spouse name: Not on file   Number of children: 2   Years of education: Not on file   Highest education level: Not on file  Occupational History    Employer: RETIRED  Tobacco Use   Smoking status: Never    Passive exposure: Never   Smokeless tobacco: Never  Vaping Use   Vaping Use: Never used  Substance and Sexual Activity   Alcohol use: Yes    Alcohol/week: 1.0 standard drink    Types: 1 Glasses of wine per week    Comment: 1 glass of wine per day   Drug use: No   Sexual activity: Not Currently  Other Topics Concern   Not on file  Social History Narrative   Widowed 3/23      Has living will   Not sure about formal POA --but requests daughter [name] (and son [name]) to be health care POAs   Would accept resuscitation attempts   Not sure about feeding tubes   Social Determinants of Health   Financial Resource Strain: Not on file  Food Insecurity: Not on file  Transportation Needs: Not on file  Physical Activity: Not on file  Stress: Not on file  Social Connections: Not on file  Intimate Partner Violence: Not on file   Review of Systems Is taking tylenol Has been referred to physiatry---back/neck pain     Objective:   Physical Exam Abdominal:     Palpations: Abdomen is soft.     Tenderness: There is no abdominal tenderness.   Neurological:     Mental Status: She is alert.  Psychiatric:     Comments: Looks tired and pale           Assessment & Plan:

## 2022-04-05 NOTE — Assessment & Plan Note (Signed)
Right is very bad Has had side effects with gabapentin, pregabalin, duloxetine Surgery is appropriate---has appt with surgeon 6/18  Will have her stop all the meds Tylenol 1000 three times a day Brief ice packs Splints at night

## 2022-04-05 NOTE — Telephone Encounter (Signed)
[facility] - Client TELEPHONE ADVICE RECORD [facility] Patient Name: [name] Gender: Female DOB: [birth year] Age: 88 Y 2 M 7 D Return Phone Number: [phone] (Primary) Address: City/ State/ Zip: [place]  [address] Client [facility] - Client Client Site [facility] - Day Provider Tillman Abide- MD Contact Type Call Who Is Calling Patient / Member / Family / Caregiver Call Type Triage / Clinical Caller Name Vincy Feliz Relationship To Patient Daughter Return Phone Number [phone] (Primary) Chief Complaint Medication reaction Reason for Call Symptomatic / Request for Health Information Initial Comment Call sent from office. Caller states her mom was over a month ago for pain in her arms and hands and was referred to Neuro and Ortho. States she was dx with carpal tunnel and had injections in her wrists. States she will probably need surgery. She had reactions to Gabapentin and Pregabalin. She was given Cymbalta and has vomited and is tired. She is waiting for a call back from Ortho. Translation No Nurse Assessment Nurse: Shon Baton, RN, Patrice Date/Time (Eastern Time): 04/05/2022 9:21:18 AM Confirm and document reason for call. If symptomatic, describe symptoms. ---Caller states her mother having pain in her rt hand / swelling in March. Dx Carpal Tunnel. Referred to Ortho and Neuro. April symptoms getting worse. Seen by Neurology. Not nerve damage. Seen by Ortho ,gave injections which helped some. Referred to Podiatry now. Place on Cymbalta and now thinks having reaction, waiting on a call from Ortho. Daughter upset and crying. having decreased appetite, lethargic. Vomited this morning. Does the patient have any new or worsening symptoms? ---Yes Will a triage be completed? ---Yes Related visit to physician within the last 2 weeks? ---Yes Does the PT have any chronic  conditions? (i.e. diabetes, asthma, this includes High risk factors for pregnancy, etc.) ---Yes List chronic conditions. ---Diabetes, HTN, Is this a behavioral health or substance abuse call? ---No PLEASE NOTE: All timestamps contained within this report are represented as [place] Standard Time. CONFIDENTIALTY NOTICE: This fax transmission is intended only for the addressee. It contains information that is legally privileged, confidential or otherwise protected from use or disclosure. If you are not the intended recipient, you are strictly prohibited from reviewing, disclosing, copying using or disseminating any of this information or taking any action in reliance on or regarding this information. If you have received this fax in error, please notify us immediately by telephone so that we can arrange for its return to [place]. Phone: [phone], Toll-Free: [phone], Fax: [phone] Page: 2 of 2 Call Id: [id number] Guidelines Guideline Title Affirmed Question Affirmed Notes Nurse Date/Time Lamount Cohen Time) Weakness (Generalized) and Fatigue [1] MODERATE weakness (i.e., interferes with work, school, normal activities) AND [2] cause unknown (Exceptions: Weakness from acute minor illness or poor fluid intake; weakness is chronic and not worse.) Shon Baton, RN, Patrice 04/05/2022 9:30:31 AM Disp. Time Lamount Cohen Time) Disposition Final User 04/05/2022 9:36:44 AM See HCP within 4 Hours (or PCP triage) Yes Shon Baton, RN, Patrice Caller Disagree/Comply Comply Caller Understands Yes PreDisposition Did not know what to do Care Advice Given Per Guideline SEE HCP (OR PCP TRIAGE) WITHIN 4 HOURS: * IF OFFICE WILL BE OPEN: You need to be seen within the next 3 or 4 hours. Call your doctor (or NP/PA) now or as soon as the office opens. BRING MEDICINES: * Please bring a list of your current medicines when you go to see the doctor. CALL BACK IF: * You become  worse Comments User: Jake Samples, RN  Date/Time Lamount Cohen Time): 04/05/2022 9:37:24 AM Call warm transfer to office for appt today Spoke with Amy Referrals REFERRED TO PCP OFFIC

## 2022-04-05 NOTE — Telephone Encounter (Signed)
Per appt notes pt already has appt to see Dr Alphonsus Sias 04/05/22 at 11:15 with Dr Alphonsus Sias. Sending note to Dr Alphonsus Sias and Carollee Herter CMA.;

## 2022-04-05 NOTE — Telephone Encounter (Signed)
See OV note.  

## 2022-04-05 NOTE — Telephone Encounter (Signed)
Will wait to see what Access Nurse says.

## 2022-04-06 ENCOUNTER — Other Ambulatory Visit: Payer: Self-pay | Admitting: Internal Medicine

## 2022-04-08 DIAGNOSIS — R5381 Other malaise: Secondary | ICD-10-CM | POA: Diagnosis not present

## 2022-04-08 DIAGNOSIS — E860 Dehydration: Secondary | ICD-10-CM | POA: Diagnosis not present

## 2022-04-08 DIAGNOSIS — E871 Hypo-osmolality and hyponatremia: Secondary | ICD-10-CM | POA: Diagnosis not present

## 2022-04-08 DIAGNOSIS — R410 Disorientation, unspecified: Secondary | ICD-10-CM | POA: Diagnosis not present

## 2022-04-08 DIAGNOSIS — I10 Essential (primary) hypertension: Secondary | ICD-10-CM | POA: Diagnosis not present

## 2022-04-08 DIAGNOSIS — E039 Hypothyroidism, unspecified: Secondary | ICD-10-CM | POA: Diagnosis not present

## 2022-04-08 DIAGNOSIS — R531 Weakness: Secondary | ICD-10-CM | POA: Diagnosis not present

## 2022-04-08 DIAGNOSIS — M6281 Muscle weakness (generalized): Secondary | ICD-10-CM | POA: Diagnosis not present

## 2022-04-08 DIAGNOSIS — G8929 Other chronic pain: Secondary | ICD-10-CM | POA: Diagnosis not present

## 2022-04-08 DIAGNOSIS — M47812 Spondylosis without myelopathy or radiculopathy, cervical region: Secondary | ICD-10-CM | POA: Diagnosis not present

## 2022-04-08 DIAGNOSIS — I2510 Atherosclerotic heart disease of native coronary artery without angina pectoris: Secondary | ICD-10-CM | POA: Diagnosis not present

## 2022-04-08 DIAGNOSIS — E1159 Type 2 diabetes mellitus with other circulatory complications: Secondary | ICD-10-CM | POA: Diagnosis not present

## 2022-04-08 DIAGNOSIS — R4182 Altered mental status, unspecified: Secondary | ICD-10-CM | POA: Diagnosis not present

## 2022-04-08 DIAGNOSIS — E119 Type 2 diabetes mellitus without complications: Secondary | ICD-10-CM | POA: Diagnosis not present

## 2022-04-08 DIAGNOSIS — E861 Hypovolemia: Secondary | ICD-10-CM | POA: Diagnosis not present

## 2022-04-08 DIAGNOSIS — I480 Paroxysmal atrial fibrillation: Secondary | ICD-10-CM | POA: Diagnosis not present

## 2022-04-08 DIAGNOSIS — Z794 Long term (current) use of insulin: Secondary | ICD-10-CM | POA: Diagnosis not present

## 2022-04-08 DIAGNOSIS — R110 Nausea: Secondary | ICD-10-CM | POA: Diagnosis not present

## 2022-04-08 DIAGNOSIS — R279 Unspecified lack of coordination: Secondary | ICD-10-CM | POA: Diagnosis not present

## 2022-04-08 DIAGNOSIS — G5603 Carpal tunnel syndrome, bilateral upper limbs: Secondary | ICD-10-CM | POA: Diagnosis not present

## 2022-04-08 DIAGNOSIS — R197 Diarrhea, unspecified: Secondary | ICD-10-CM | POA: Diagnosis not present

## 2022-04-08 DIAGNOSIS — K529 Noninfective gastroenteritis and colitis, unspecified: Secondary | ICD-10-CM | POA: Diagnosis not present

## 2022-04-14 DIAGNOSIS — Z794 Long term (current) use of insulin: Secondary | ICD-10-CM | POA: Diagnosis not present

## 2022-04-14 DIAGNOSIS — R630 Anorexia: Secondary | ICD-10-CM | POA: Diagnosis not present

## 2022-04-14 DIAGNOSIS — G5601 Carpal tunnel syndrome, right upper limb: Secondary | ICD-10-CM | POA: Diagnosis not present

## 2022-04-14 DIAGNOSIS — R279 Unspecified lack of coordination: Secondary | ICD-10-CM | POA: Diagnosis not present

## 2022-04-14 DIAGNOSIS — F32A Depression, unspecified: Secondary | ICD-10-CM | POA: Diagnosis not present

## 2022-04-14 DIAGNOSIS — G5603 Carpal tunnel syndrome, bilateral upper limbs: Secondary | ICD-10-CM | POA: Diagnosis not present

## 2022-04-14 DIAGNOSIS — I480 Paroxysmal atrial fibrillation: Secondary | ICD-10-CM | POA: Diagnosis not present

## 2022-04-14 DIAGNOSIS — Z7409 Other reduced mobility: Secondary | ICD-10-CM | POA: Diagnosis not present

## 2022-04-14 DIAGNOSIS — G4700 Insomnia, unspecified: Secondary | ICD-10-CM | POA: Diagnosis not present

## 2022-04-14 DIAGNOSIS — M25511 Pain in right shoulder: Secondary | ICD-10-CM | POA: Diagnosis not present

## 2022-04-14 DIAGNOSIS — E871 Hypo-osmolality and hyponatremia: Secondary | ICD-10-CM | POA: Diagnosis not present

## 2022-04-14 DIAGNOSIS — K529 Noninfective gastroenteritis and colitis, unspecified: Secondary | ICD-10-CM | POA: Diagnosis not present

## 2022-04-14 DIAGNOSIS — I10 Essential (primary) hypertension: Secondary | ICD-10-CM | POA: Diagnosis not present

## 2022-04-14 DIAGNOSIS — R748 Abnormal levels of other serum enzymes: Secondary | ICD-10-CM | POA: Diagnosis not present

## 2022-04-14 DIAGNOSIS — R52 Pain, unspecified: Secondary | ICD-10-CM | POA: Diagnosis not present

## 2022-04-14 DIAGNOSIS — R5381 Other malaise: Secondary | ICD-10-CM | POA: Diagnosis not present

## 2022-04-14 DIAGNOSIS — M25531 Pain in right wrist: Secondary | ICD-10-CM | POA: Diagnosis not present

## 2022-04-14 DIAGNOSIS — G5600 Carpal tunnel syndrome, unspecified upper limb: Secondary | ICD-10-CM | POA: Diagnosis not present

## 2022-04-14 DIAGNOSIS — G5602 Carpal tunnel syndrome, left upper limb: Secondary | ICD-10-CM | POA: Diagnosis not present

## 2022-04-14 DIAGNOSIS — E1159 Type 2 diabetes mellitus with other circulatory complications: Secondary | ICD-10-CM | POA: Diagnosis not present

## 2022-04-14 DIAGNOSIS — E119 Type 2 diabetes mellitus without complications: Secondary | ICD-10-CM | POA: Diagnosis not present

## 2022-04-14 DIAGNOSIS — M6281 Muscle weakness (generalized): Secondary | ICD-10-CM | POA: Diagnosis not present

## 2022-04-14 DIAGNOSIS — M25532 Pain in left wrist: Secondary | ICD-10-CM | POA: Diagnosis not present

## 2022-04-14 DIAGNOSIS — R531 Weakness: Secondary | ICD-10-CM | POA: Diagnosis not present

## 2022-04-15 ENCOUNTER — Telehealth: Payer: Self-pay

## 2022-04-15 NOTE — Telephone Encounter (Signed)
Called and left a message on VM to see how she is doing after her ER visit yesterday. Asked if they could call and give [place] an update or send a MyChart message.

## 2022-04-16 DIAGNOSIS — G4700 Insomnia, unspecified: Secondary | ICD-10-CM | POA: Diagnosis not present

## 2022-04-16 DIAGNOSIS — G5600 Carpal tunnel syndrome, unspecified upper limb: Secondary | ICD-10-CM | POA: Diagnosis not present

## 2022-04-16 DIAGNOSIS — E119 Type 2 diabetes mellitus without complications: Secondary | ICD-10-CM | POA: Diagnosis not present

## 2022-04-16 DIAGNOSIS — R531 Weakness: Secondary | ICD-10-CM | POA: Diagnosis not present

## 2022-04-18 DIAGNOSIS — G5601 Carpal tunnel syndrome, right upper limb: Secondary | ICD-10-CM | POA: Diagnosis not present

## 2022-04-18 DIAGNOSIS — Z7409 Other reduced mobility: Secondary | ICD-10-CM | POA: Diagnosis not present

## 2022-04-18 DIAGNOSIS — M25531 Pain in right wrist: Secondary | ICD-10-CM | POA: Diagnosis not present

## 2022-04-18 DIAGNOSIS — K529 Noninfective gastroenteritis and colitis, unspecified: Secondary | ICD-10-CM | POA: Diagnosis not present

## 2022-04-18 DIAGNOSIS — G5602 Carpal tunnel syndrome, left upper limb: Secondary | ICD-10-CM | POA: Diagnosis not present

## 2022-04-18 DIAGNOSIS — R531 Weakness: Secondary | ICD-10-CM | POA: Diagnosis not present

## 2022-04-18 DIAGNOSIS — M25511 Pain in right shoulder: Secondary | ICD-10-CM | POA: Diagnosis not present

## 2022-04-18 DIAGNOSIS — E871 Hypo-osmolality and hyponatremia: Secondary | ICD-10-CM | POA: Diagnosis not present

## 2022-04-18 DIAGNOSIS — M25532 Pain in left wrist: Secondary | ICD-10-CM | POA: Diagnosis not present

## 2022-04-19 NOTE — Telephone Encounter (Signed)
Daughter is calling back, patient was admitted for a few days and was released last Thursday. Patient is currently at [facility] in [place].

## 2022-04-20 DIAGNOSIS — K529 Noninfective gastroenteritis and colitis, unspecified: Secondary | ICD-10-CM | POA: Diagnosis not present

## 2022-04-20 DIAGNOSIS — R630 Anorexia: Secondary | ICD-10-CM | POA: Diagnosis not present

## 2022-04-20 DIAGNOSIS — F32A Depression, unspecified: Secondary | ICD-10-CM | POA: Diagnosis not present

## 2022-04-20 DIAGNOSIS — E871 Hypo-osmolality and hyponatremia: Secondary | ICD-10-CM | POA: Diagnosis not present

## 2022-04-22 DIAGNOSIS — G5603 Carpal tunnel syndrome, bilateral upper limbs: Secondary | ICD-10-CM | POA: Diagnosis not present

## 2022-04-25 ENCOUNTER — Telehealth: Payer: Self-pay

## 2022-04-25 DIAGNOSIS — Z794 Long term (current) use of insulin: Secondary | ICD-10-CM

## 2022-04-25 DIAGNOSIS — G5602 Carpal tunnel syndrome, left upper limb: Secondary | ICD-10-CM | POA: Diagnosis not present

## 2022-04-25 DIAGNOSIS — G5601 Carpal tunnel syndrome, right upper limb: Secondary | ICD-10-CM | POA: Diagnosis not present

## 2022-04-25 DIAGNOSIS — M25511 Pain in right shoulder: Secondary | ICD-10-CM | POA: Diagnosis not present

## 2022-04-25 DIAGNOSIS — E871 Hypo-osmolality and hyponatremia: Secondary | ICD-10-CM | POA: Diagnosis not present

## 2022-04-25 DIAGNOSIS — I480 Paroxysmal atrial fibrillation: Secondary | ICD-10-CM

## 2022-04-25 DIAGNOSIS — R531 Weakness: Secondary | ICD-10-CM | POA: Diagnosis not present

## 2022-04-25 DIAGNOSIS — M25532 Pain in left wrist: Secondary | ICD-10-CM | POA: Diagnosis not present

## 2022-04-25 DIAGNOSIS — E119 Type 2 diabetes mellitus without complications: Secondary | ICD-10-CM | POA: Diagnosis not present

## 2022-04-25 DIAGNOSIS — M25531 Pain in right wrist: Secondary | ICD-10-CM | POA: Diagnosis not present

## 2022-04-25 NOTE — Telephone Encounter (Signed)
Patient needs a hospital follow up. She will be d/c on 6/15. Do not see anything open to put her in please call daughter to make appointment

## 2022-04-25 NOTE — Telephone Encounter (Signed)
Spoke to [place]. Made appt for next week. She is concerned about the care she is getting in rehab. Pt's has had low sodium and they have not been treating it. She had labs drawn today and will get the results tomorrow.

## 2022-04-25 NOTE — Telephone Encounter (Signed)
Looks like we have next Monday at 4, next Wed at 1215, and next Friday at 1230.

## 2022-04-26 DIAGNOSIS — R52 Pain, unspecified: Secondary | ICD-10-CM | POA: Diagnosis not present

## 2022-04-26 DIAGNOSIS — G5603 Carpal tunnel syndrome, bilateral upper limbs: Secondary | ICD-10-CM | POA: Diagnosis not present

## 2022-04-26 NOTE — Telephone Encounter (Signed)
Patient scheduled for labs on Monday 6.19.23, need orders entered

## 2022-04-27 NOTE — Addendum Note (Signed)
Addended by: Tillman Abide I on: 04/27/2022 07:21 AM   Modules accepted: Orders

## 2022-04-28 DIAGNOSIS — M4802 Spinal stenosis, cervical region: Secondary | ICD-10-CM | POA: Diagnosis not present

## 2022-04-28 DIAGNOSIS — M47812 Spondylosis without myelopathy or radiculopathy, cervical region: Secondary | ICD-10-CM | POA: Diagnosis not present

## 2022-04-28 DIAGNOSIS — M5412 Radiculopathy, cervical region: Secondary | ICD-10-CM | POA: Diagnosis not present

## 2022-05-02 ENCOUNTER — Other Ambulatory Visit (INDEPENDENT_AMBULATORY_CARE_PROVIDER_SITE_OTHER): Payer: Medicare PPO

## 2022-05-02 DIAGNOSIS — I480 Paroxysmal atrial fibrillation: Secondary | ICD-10-CM

## 2022-05-02 DIAGNOSIS — E083293 Diabetes mellitus due to underlying condition with mild nonproliferative diabetic retinopathy without macular edema, bilateral: Secondary | ICD-10-CM | POA: Diagnosis not present

## 2022-05-02 DIAGNOSIS — Z794 Long term (current) use of insulin: Secondary | ICD-10-CM | POA: Diagnosis not present

## 2022-05-02 DIAGNOSIS — E113293 Type 2 diabetes mellitus with mild nonproliferative diabetic retinopathy without macular edema, bilateral: Secondary | ICD-10-CM | POA: Diagnosis not present

## 2022-05-02 NOTE — Progress Notes (Unsigned)
Cardiology Office Note    Date:  05/03/2022   ID:  [name], [name] [birth year], MRN [id number]  PCP:  Hannah Schwalbe, MD  Cardiologist:  Hannah Nordmann, MD  Electrophysiologist:  None   Chief Complaint: Hospital follow-up  History of Present Illness:   [name] is a 88 y.o. female with history of PAF diagnosed in the late 1990s, diastolic dysfunction, DM2, HTN, HLD, hypothyroidism, and GERD who presents for hospital follow-up as outlined below.  She was initially diagnosed with A-fib in the 1990s.  More recently, she wore a heart monitor in 06/2016 which showed no evidence of A-fib.  Echo in 06/2016 demonstrated an EF of 60 to 65%, no regional wall motion abnormalities, grade 1 diastolic dysfunction, mild mitral regurgitation, normal RV systolic function, and normal PASP.  She has not been maintained on anticoagulation given presumed isolated episode in the late 1990s.  She was last seen in our office in 12/2021 noting some shortness of breath and felt like this was due to allergies with symptomatic improvement with albuterol.  She was admitted to the hospital from 5/26 through 04/14/2022, with hyponatremia with a presenting sodium of 119.  Treated with DDAVP and D5 bolus.  Urine studies were deferred given she had received fluids and DDAVP.  There was concern her hyponatremia was in the setting of a component of hypovolemic hyponatremia secondary to diarrhea along with some pseudohyponatremia secondary to hyperglycemia.  Sodium had improved to 132 by discharge.  She comes in accompanied by her daughter today and is doing well from a cardiac perspective, without symptoms of angina or decompensation.  No dizziness, presyncope, or syncope.  She does note ongoing diarrhea that has been present for approximately 6 months.  She does wonder if this was related to her titrated dose of metformin, which occurred around the time her diarrhea began.  Her weight is down 11 pounds today when  compared to her last clinic visit in 12/2021.  Her appetite is not as vigorous as it previously was.  She also reports a 10 pound weight loss in 01/25/22 following the death of her husband.  Her daughter encourages her to eat iron supplements with Boost.  No lower extremity swelling, abdominal distention, or orthopnea.  Blood pressures from her living facility were reviewed and have been well controlled dating back to 04/23/2022.   Labs independently reviewed: 04/2022 - TC 88, TG 164, HDL 48, LDL 7, TSH 5.76, free T4 normal, Hgb 11.3, PLT 501, sodium 126 corrected to 129 for hyperglycemia, potassium 5.0, BUN 18, serum creatinine 0.64, albumin 3.5, glucose 241, AST/ALT normal, A1c 8.7, magnesium 1.6   Past Medical History:  Diagnosis Date   Allergy    Asthma    Diabetes mellitus    Diastolic dysfunction    a. 06/2019 Echo: EF 60-65%, no rwma, GR1 DD. Mild MR. Nl RV fxn.   Essential hypertension    GERD (gastroesophageal reflux disease)    Hyperlipidemia    Mixed hyperlipidemia    Osteopenia    PAF (paroxysmal atrial fibrillation) (HCC)    a. Dx in late 1990's - no known recurrence.   Sleep disorder    Thyroid disease     Past Surgical History:  Procedure Laterality Date   CERVICAL CONE BIOPSY  1960's   CHOLECYSTECTOMY     NASAL POLYP SURGERY  01/01   STRABISMUS SURGERY  1946 / 1967   VAGINAL DELIVERY     x2    Current Medications:  Current Meds  Medication Sig   ACCU-CHEK SMARTVIEW test strip    albuterol (VENTOLIN HFA) 108 (90 Base) MCG/ACT inhaler Inhale 2 puffs into the lungs every 6 (six) hours as needed.   ALPRAZolam (XANAX) 0.25 MG tablet TAKE 1 TABLET(0.25 MG) BY MOUTH TWICE DAILY AS NEEDED FOR ANXIETY   amLODipine (NORVASC) 5 MG tablet Take 1 tablet (5 mg total) by mouth daily.   aspirin 81 MG tablet Take 81 mg by mouth daily.   atorvastatin (LIPITOR) 20 MG tablet TAKE 1 TABLET BY MOUTH EVERY DAY   B-D UF III MINI PEN NEEDLES 31G X 5 MM MISC USE ONCE TO TWICE DAILY   B-D  ULTRA-FINE 33 LANCETS MISC Use to test blood sugar twice daily dx: E11.9   calcitonin, salmon, (MIACALCIN/FORTICAL) 200 UNIT/ACT nasal spray USE 1 SPRAY NASALLY(ALTERNATING NOSTRILS) EVERY DAY   cetirizine (ZYRTEC) 5 MG tablet Take 5 mg by mouth daily.   famotidine (PEPCID) 20 MG tablet Take 1 tablet by mouth daily.   fluticasone (FLONASE) 50 MCG/ACT nasal spray Place 2 sprays into both nostrils daily as needed for allergies or rhinitis.   insulin glargine (LANTUS) 100 UNIT/ML Solostar Pen 28 Units daily.   levothyroxine (SYNTHROID, LEVOTHROID) 100 MCG tablet Take 100 mcg by mouth daily before breakfast.   MAGNESIUM PO Take 1 tablet by mouth in the morning and at bedtime.   meclizine (ANTIVERT) 25 MG tablet Take 1 tablet (25 mg total) by mouth 3 (three) times daily as needed.   metFORMIN (GLUCOPHAGE-XR) 500 MG 24 hr tablet Take 2 tablets by mouth 2 (two) times daily before a meal.   metoprolol succinate (TOPROL-XL) 50 MG 24 hr tablet TAKE 1 TABLET(50 MG) BY MOUTH DAILY   montelukast (SINGULAIR) 10 MG tablet Take 1 tablet (10 mg total) by mouth at bedtime.   Multiple Vitamin (MULTIVITAMIN) tablet Take 1 tablet by mouth daily.   Multiple Vitamins-Minerals (OCUVITE PO) Take 1 tablet by mouth daily.   Spacer/Aero-Holding Chambers (VALVED HOLDING CHAMBER) DEVI    TURMERIC PO Take 400 mg by mouth daily.   vitamin C (ASCORBIC ACID) 500 MG tablet Take 500 mg by mouth daily.   [DISCONTINUED] losartan (COZAAR) 25 MG tablet Take 1 tablet by mouth daily.    Allergies:   Gabapentin, Cefdinir, Ciprofloxacin, Clarithromycin, Miglitol, Niacin, and Qvar [beclomethasone]   Social History   Socioeconomic History   Marital status: Widowed    Spouse name: Not on file   Number of children: 2   Years of education: Not on file   Highest education level: Not on file  Occupational History    Employer: RETIRED  Tobacco Use   Smoking status: Never    Passive exposure: Never   Smokeless tobacco: Never  Vaping  Use   Vaping Use: Never used  Substance and Sexual Activity   Alcohol use: Not Currently    Alcohol/week: 1.0 standard drink of alcohol    Types: 1 Glasses of wine per week    Comment: 1 glass of wine per day   Drug use: No   Sexual activity: Not Currently  Other Topics Concern   Not on file  Social History Narrative   Widowed 3/23      Has living will   Not sure about formal POA --but requests daughter [name] (and son [name]) to be health care POAs   Would accept resuscitation attempts   Not sure about feeding tubes   Social Determinants of Health   Financial Resource Strain: Not on  file  Food Insecurity: Not on file  Transportation Needs: Not on file  Physical Activity: Not on file  Stress: Not on file  Social Connections: Not on file     Family History:  The patient's family history includes Asthma in her mother; Coronary artery disease in her father.  ROS:   12-point review of systems is negative unless otherwise noted in the HPI.   EKGs/Labs/Other Studies Reviewed:    Studies reviewed were summarized above. The additional studies were reviewed today:  Cardiac monitor 06/2016: Baseline rhythm was sinus, heart rate of 72 BPM. Heart rate ranged from 49-112 BPM, average of 68 BPM. No atrial fibrillation.   Manually detected events: Symptoms of chest pain, accidental push - sinus rhythm, sinus rhythm with first-degree AV block __________  2D echo 07/01/2016: - Left ventricle: The cavity size was normal. Systolic function was    normal. The estimated ejection fraction was in the range of 60%    to 65%. Wall motion was normal; there were no regional wall    motion abnormalities. Doppler parameters are consistent with    abnormal left ventricular relaxation (grade 1 diastolic    dysfunction).  - Mitral valve: There was mild regurgitation.  - Left atrium: The atrium was normal in size.  - Right ventricle: Systolic function was normal.  - Pulmonary arteries: Systolic  pressure was within the normal    range.   Impressions:   - Rhythm is normal sinus.    EKG:  EKG is ordered today.  The EKG ordered today demonstrates NSR, 78 bpm, low voltage QRS, incomplete RBBB, no significant change when compared to prior tracing  Recent Labs: 05/02/2022: ALT 13; BUN 18; Creatinine, Ser 0.64; Hemoglobin 11.3; Platelets 501.0; Potassium 5.0; Sodium 126; TSH 5.76  Recent Lipid Panel    Component Value Date/Time   CHOL 88 05/02/2022 1402   CHOL 100 06/23/2016 1236   TRIG 164.0 (H) 05/02/2022 1402   HDL 48.20 05/02/2022 1402   HDL 44 06/23/2016 1236   CHOLHDL 2 05/02/2022 1402   VLDL 32.8 05/02/2022 1402   LDLCALC 7 05/02/2022 1402   LDLCALC 18 06/23/2016 1236   LDLDIRECT 45.0 09/09/2021 1646    PHYSICAL EXAM:    VS:  BP 120/74 (BP Location: Left Arm, Patient Position: Sitting, Cuff Size: Large)   Pulse 78   Ht 5\' 4"  (1.626 m)   Wt 164 lb (74.4 kg)   SpO2 97%   BMI 28.15 kg/m   BMI: Body mass index is 28.15 kg/m.  Physical Exam Vitals reviewed.  Constitutional:      Appearance: She is well-developed.  HENT:     Head: Normocephalic and atraumatic.  Eyes:     General:        Right eye: No discharge.        Left eye: No discharge.  Neck:     Vascular: No JVD.  Cardiovascular:     Rate and Rhythm: Normal rate and regular rhythm.     Heart sounds: Normal heart sounds, S1 normal and S2 normal. Heart sounds not distant. No midsystolic click and no opening snap. No murmur heard.    No friction rub.  Pulmonary:     Effort: Pulmonary effort is normal. No respiratory distress.     Breath sounds: Normal breath sounds. No decreased breath sounds, wheezing or rales.  Chest:     Chest wall: No tenderness.  Abdominal:     General: There is no distension.  Palpations: Abdomen is soft.  Musculoskeletal:     Cervical back: Normal range of motion.     Right lower leg: No edema.     Left lower leg: No edema.  Skin:    General: Skin is warm and dry.      Nails: There is no clubbing.  Neurological:     Mental Status: She is alert and oriented to person, place, and time.  Psychiatric:        Speech: Speech normal.        Behavior: Behavior normal.        Thought Content: Thought content normal.        Judgment: Judgment normal.     Wt Readings from Last 3 Encounters:  05/03/22 164 lb (74.4 kg)  04/05/22 170 lb (77.1 kg)  02/16/22 172 lb (78 kg)     ASSESSMENT & PLAN:   PAF: Maintaining sinus rhythm.  Appears to have been an isolated episode in the 1990s.  Previously declined anticoagulation.  She remains on metoprolol.  HTN: Blood pressure is well controlled in the office today.  Labs from yesterday show a high normal potassium.  In this setting, we will discontinue losartan and start amlodipine 5 mg daily.  She will otherwise continue current dose metoprolol.  HLD: She remains on atorvastatin 20 mg.  Hyponatremia: Suspected to be multifactorial including hypovolemic in the setting of gastroenteritis/diarrhea as well as possible pseudohyponatremia secondary to hyperglycemia.  Most recent sodium level low, though stable and corrected to 129 for hyperglycemia.  Follow-up with PCP.  DM2: Last A1c 9.3.  Managed by endocrinology.  Diarrhea: Encouraged her to reach out to endocrinology for their input regarding metformin use.  Follow-up with PCP.  Likely contributing to weight loss and hyponatremia.    Disposition: F/u with Dr. Rockey Situ or an APP in 3 months.   Medication Adjustments/Labs and Tests Ordered: Current medicines are reviewed at length with the patient today.  Concerns regarding medicines are outlined above. Medication changes, Labs and Tests ordered today are summarized above and listed in the Patient Instructions accessible in Encounters.   Signed, Christell Faith, PA-C 05/03/2022 12:19 PM     [facility] [address] [place], [place] [address] [phone]

## 2022-05-03 ENCOUNTER — Ambulatory Visit: Payer: Medicare PPO | Admitting: Physician Assistant

## 2022-05-03 ENCOUNTER — Encounter: Payer: Self-pay | Admitting: Physician Assistant

## 2022-05-03 VITALS — BP 120/74 | HR 78 | Ht 64.0 in | Wt 164.0 lb

## 2022-05-03 DIAGNOSIS — E1169 Type 2 diabetes mellitus with other specified complication: Secondary | ICD-10-CM

## 2022-05-03 DIAGNOSIS — R197 Diarrhea, unspecified: Secondary | ICD-10-CM

## 2022-05-03 DIAGNOSIS — I10 Essential (primary) hypertension: Secondary | ICD-10-CM

## 2022-05-03 DIAGNOSIS — E782 Mixed hyperlipidemia: Secondary | ICD-10-CM

## 2022-05-03 DIAGNOSIS — E871 Hypo-osmolality and hyponatremia: Secondary | ICD-10-CM

## 2022-05-03 DIAGNOSIS — I480 Paroxysmal atrial fibrillation: Secondary | ICD-10-CM | POA: Diagnosis not present

## 2022-05-03 DIAGNOSIS — I700 Atherosclerosis of aorta: Secondary | ICD-10-CM

## 2022-05-03 LAB — MICROALBUMIN / CREATININE URINE RATIO
Creatinine,U: 99.6 mg/dL
Microalb Creat Ratio: 4.2 mg/g (ref 0.0–30.0)
Microalb, Ur: 4.2 mg/dL — ABNORMAL HIGH (ref 0.0–1.9)

## 2022-05-03 LAB — LIPID PANEL
Cholesterol: 88 mg/dL (ref 0–200)
HDL: 48.2 mg/dL (ref >39.00)
LDL Cholesterol: 7 mg/dL (ref 0–99)
NonHDL: 39.88
Total CHOL/HDL Ratio: 2
Triglycerides: 164 mg/dL — ABNORMAL HIGH (ref 0.0–149.0)
VLDL: 32.8 mg/dL (ref 0.0–40.0)

## 2022-05-03 LAB — T4, FREE: Free T4: 1.23 ng/dL (ref 0.60–1.60)

## 2022-05-03 LAB — RENAL FUNCTION PANEL
Albumin: 3.5 g/dL (ref 3.5–5.2)
BUN: 18 mg/dL (ref 6–23)
CO2: 27 mEq/L (ref 19–32)
Calcium: 9.1 mg/dL (ref 8.4–10.5)
Chloride: 88 mEq/L — ABNORMAL LOW (ref 96–112)
Creatinine, Ser: 0.64 mg/dL (ref 0.40–1.20)
GFR: 78.44 mL/min (ref >60.00)
Glucose, Bld: 241 mg/dL — ABNORMAL HIGH (ref 70–99)
Phosphorus: 4.6 mg/dL (ref 2.3–4.6)
Potassium: 5 mEq/L (ref 3.5–5.1)
Sodium: 126 mEq/L — ABNORMAL LOW (ref 135–145)

## 2022-05-03 LAB — CBC
HCT: 32.9 % — ABNORMAL LOW (ref 36.0–46.0)
Hemoglobin: 11.3 g/dL — ABNORMAL LOW (ref 12.0–15.0)
MCHC: 34.4 g/dL (ref 30.0–36.0)
MCV: 83 fl (ref 78.0–100.0)
Platelets: 501 10*3/uL — ABNORMAL HIGH (ref 150.0–400.0)
RBC: 3.97 Mil/uL (ref 3.87–5.11)
RDW: 14.4 % (ref 11.5–15.5)
WBC: 10.6 10*3/uL — ABNORMAL HIGH (ref 4.0–10.5)

## 2022-05-03 LAB — HEMOGLOBIN A1C: Hgb A1c MFr Bld: 8.7 % — ABNORMAL HIGH (ref 4.6–6.5)

## 2022-05-03 LAB — HEPATIC FUNCTION PANEL
ALT: 13 U/L (ref 0–35)
AST: 13 U/L (ref 0–37)
Albumin: 3.5 g/dL (ref 3.5–5.2)
Alkaline Phosphatase: 74 U/L (ref 39–117)
Bilirubin, Direct: 0.1 mg/dL (ref 0.0–0.3)
Total Bilirubin: 0.4 mg/dL (ref 0.2–1.2)
Total Protein: 6.5 g/dL (ref 6.0–8.3)

## 2022-05-03 LAB — TSH: TSH: 5.76 u[IU]/mL — ABNORMAL HIGH (ref 0.35–5.50)

## 2022-05-03 MED ORDER — AMLODIPINE BESYLATE 5 MG PO TABS: 5.0000 mg | ORAL_TABLET | Freq: Every day | ORAL | 1 refills | Status: DC

## 2022-05-03 NOTE — Patient Instructions (Signed)
Medication Instructions:  - Your physician has recommended you make the following change in your medication:   1) STOP Losartan  2) START Amlodipine 5 mg: - take 1 tablet by mouth once daily   *If you need a refill on your cardiac medications before your next appointment, please call your pharmacy*   Lab Work: - none ordered  If you have labs (blood work) drawn today and your tests are completely normal, you will receive your results only by: MyChart Message (if you have MyChart) OR A paper copy in the mail If you have any lab test that is abnormal or we need to change your treatment, we will call you to review the results.   Testing/Procedures: - none ordered   Follow-Up: At [facility], you and your health needs are our priority.  As part of our continuing mission to provide you with exceptional heart care, we have created designated Provider Care Teams.  These Care Teams include your primary Cardiologist (physician) and Advanced Practice Providers (APPs -  Physician Assistants and Nurse Practitioners) who all work together to provide you with the care you need, when you need it.  We recommend signing up for the patient portal called "MyChart".  Sign up information is provided on this After Visit Summary.  MyChart is used to connect with patients for Virtual Visits (Telemedicine).  Patients are able to view lab/test results, encounter notes, upcoming appointments, etc.  Non-urgent messages can be sent to your provider as well.   To learn more about what you can do with MyChart, go to [email or web address].    Your next appointment:   3 month(s)  The format for your next appointment:   In Person  Provider:   You may see Julien Nordmann, MD or one of the following Advanced Practice Providers on your designated Care Team:    Eula Listen, PA-C    Other Instructions  Amlodipine Tablets What is this medication? AMLODIPINE (am LOE di peen) treats high blood pressure and  prevents chest pain (angina). It works by relaxing the blood vessels, which helps decrease the amount of work your heart has to do. It belongs to a group of medications called calcium channel blockers. This medicine may be used for other purposes; ask your health care provider or pharmacist if you have questions. COMMON BRAND NAME(S): Norvasc What should I tell my care team before I take this medication? They need to know if you have any of these conditions: Heart disease Liver disease An unusual or allergic reaction to amlodipine, other medications, foods, dyes, or preservatives Pregnant or trying to get pregnant Breast-feeding How should I use this medication? Take this medication by mouth. Take it as directed on the prescription label at the same time every day. You can take it with or without food. If it upsets your stomach, take it with food. Keep taking it unless your care team tells you to stop. Talk to your care team about the use of this medication in children. While it may be prescribed for children as young as 6 for selected conditions, precautions do apply. Overdosage: If you think you have taken too much of this medicine contact a poison control center or emergency room at once. NOTE: This medicine is only for you. Do not share this medicine with others. What if I miss a dose? If you miss a dose, take it as soon as you can. If it is almost time for your next dose, take only that dose. Do not  take double or extra doses. What may interact with this medication? Clarithromycin Cyclosporine Diltiazem Itraconazole Simvastatin Tacrolimus This list may not describe all possible interactions. Give your health care provider a list of all the medicines, herbs, non-prescription drugs, or dietary supplements you use. Also tell them if you smoke, drink alcohol, or use illegal drugs. Some items may interact with your medicine. What should I watch for while using this medication? Visit your  health care provider for regular checks on your progress. Check your blood pressure as directed. Ask your health care provider what your blood pressure should be. Also, find out when you should contact him or her. Do not treat yourself for coughs, colds, or pain while you are using this medication without asking your health care provider for advice. Some medications may increase your blood pressure. You may get drowsy or dizzy. Do not drive, use machinery, or do anything that needs mental alertness until you know how this medication affects you. Do not stand up or sit up quickly, especially if you are an older patient. This reduces the risk of dizzy or fainting spells. Alcohol can make you more drowsy and dizzy. Avoid alcoholic drinks. What side effects may I notice from receiving this medication? Side effects that you should report to your care team as soon as possible: Allergic reactions--skin rash, itching, hives, swelling of the face, lips, tongue, or throat Heart attack--pain or tightness in the chest, shoulders, arms, or jaw, nausea, shortness of breath, cold or clammy skin, feeling faint or lightheaded Low blood pressure--dizziness, feeling faint or lightheaded, blurry vision Side effects that usually do not require medical attention (report these to your care team if they continue or are bothersome): Facial flushing, redness Heart palpitations--rapid, pounding, or irregular heartbeat Nausea Stomach pain Swelling of the ankles, hands, or feet This list may not describe all possible side effects. Call your doctor for medical advice about side effects. You may report side effects to FDA at 1-800-FDA-1088. Where should I keep my medication? Keep out of the reach of children and pets. Store at room temperature between 20 and 25 degrees C (68 and 77 degrees F). Protect from light and moisture. Keep the container tightly closed. Get rid of any unused medication after the expiration date. To get  rid of medications that are no longer needed or have expired: Take the medication to a medication take-back program. Check with your pharmacy or law enforcement to find a location. If you cannot return the medication, check the label or package insert to see if the medication should be thrown out in the garbage or flushed down the toilet. If you are not sure, ask your health care provider. If it is safe to put in the trash, empty the medication out of the container. Mix the medication with cat litter, dirt, coffee grounds, or other unwanted substance. Seal the mixture in a bag or container. Put it in the trash. NOTE: This sheet is a summary. It may not cover all possible information. If you have questions about this medicine, talk to your doctor, pharmacist, or health care provider.  2023 Elsevier/Gold Standard (2021-10-01 00:00:00)   Important Information About Sugar

## 2022-05-04 ENCOUNTER — Encounter: Payer: Self-pay | Admitting: Internal Medicine

## 2022-05-04 ENCOUNTER — Ambulatory Visit: Payer: Medicare PPO | Admitting: Internal Medicine

## 2022-05-04 VITALS — BP 122/80 | HR 80 | Temp 97.3°F | Ht 63.0 in | Wt 165.0 lb

## 2022-05-04 DIAGNOSIS — E871 Hypo-osmolality and hyponatremia: Secondary | ICD-10-CM | POA: Diagnosis not present

## 2022-05-04 DIAGNOSIS — I10 Essential (primary) hypertension: Secondary | ICD-10-CM | POA: Diagnosis not present

## 2022-05-04 DIAGNOSIS — Z794 Long term (current) use of insulin: Secondary | ICD-10-CM

## 2022-05-04 DIAGNOSIS — E083293 Diabetes mellitus due to underlying condition with mild nonproliferative diabetic retinopathy without macular edema, bilateral: Secondary | ICD-10-CM

## 2022-05-04 DIAGNOSIS — F39 Unspecified mood [affective] disorder: Secondary | ICD-10-CM

## 2022-05-04 DIAGNOSIS — R197 Diarrhea, unspecified: Secondary | ICD-10-CM | POA: Diagnosis not present

## 2022-05-04 NOTE — Patient Instructions (Signed)
Please stop the metformin completely. If your diarrhea goes away, you can add back 500mg  in the morning for a few days and then 500mg  twice a day as tolerated. Consider stopping the magnesium if the diarrhea persists. I would recommend considering jardiance 10mg  for your diabetes.

## 2022-05-04 NOTE — Assessment & Plan Note (Signed)
Lab Results  Component Value Date   HGBA1C 8.7 (H) 05/02/2022   Slightly better On lantus 28 Decrease metformin to 500 bid if tolerated Consider jardiance 10

## 2022-05-04 NOTE — Progress Notes (Signed)
Subjective:    Patient ID: [name], female    DOB: Feb 27, 1931, 88 y.o.   MRN: [id number]  HPI Here with daughter for follow up after hospitalization and rehab  Was hospitalized at [facility] Sodium 119---treated and improved Did rehab at [facility] in [place] Back home now  Ongoing diarrhea Feels it is related to metformin--which was doubled recently Discussed magnesium--started by cardiology afte ER visit with palpitations Stopped again--and was low again Now on mag oxide  Was on 26 units of lantus Metformin 1000 bid now----in April. That is when diarrhea started Taken off metformin in hospital and diarrhea went away Restarted in rehab---and the diarrhea came back (so bad she needs imodium) Fasting sugars under 130 mostly 2 hours after eating--in the 200's  Current Outpatient Medications on File Prior to Visit  Medication Sig Dispense Refill   ACCU-CHEK SMARTVIEW test strip   3   acetaminophen (TYLENOL) 500 MG tablet Take 500 mg by mouth every 8 (eight) hours as needed.     albuterol (VENTOLIN HFA) 108 (90 Base) MCG/ACT inhaler Inhale 2 puffs into the lungs every 6 (six) hours as needed. 18 g 1   ALPRAZolam (XANAX) 0.25 MG tablet TAKE 1 TABLET(0.25 MG) BY MOUTH TWICE DAILY AS NEEDED FOR ANXIETY 60 tablet 0   amLODipine (NORVASC) 5 MG tablet Take 1 tablet (5 mg total) by mouth daily. 90 tablet 1   aspirin 81 MG tablet Take 81 mg by mouth daily.     atorvastatin (LIPITOR) 20 MG tablet TAKE 1 TABLET BY MOUTH EVERY DAY 90 tablet 3   B-D UF III MINI PEN NEEDLES 31G X 5 MM MISC USE ONCE TO TWICE DAILY 200 each 4   B-D ULTRA-FINE 33 LANCETS MISC Use to test blood sugar twice daily dx: E11.9 200 each 3   calcitonin, salmon, (MIACALCIN/FORTICAL) 200 UNIT/ACT nasal spray USE 1 SPRAY NASALLY(ALTERNATING NOSTRILS) EVERY DAY 3.7 mL 11   cetirizine (ZYRTEC) 5 MG tablet Take 5 mg by mouth daily.     diclofenac Sodium (VOLTAREN) 1 % GEL Apply 4 g topically 4 (four) times daily.      famotidine (PEPCID) 20 MG tablet Take 1 tablet by mouth daily.     fluticasone (FLONASE) 50 MCG/ACT nasal spray Place 2 sprays into both nostrils daily as needed for allergies or rhinitis.     insulin glargine (LANTUS) 100 UNIT/ML Solostar Pen 28 Units daily.     levothyroxine (SYNTHROID, LEVOTHROID) 100 MCG tablet Take 100 mcg by mouth daily before breakfast.     MAGNESIUM PO Take 1 tablet by mouth in the morning and at bedtime.     meclizine (ANTIVERT) 25 MG tablet Take 1 tablet (25 mg total) by mouth 3 (three) times daily as needed. 90 tablet 0   metFORMIN (GLUCOPHAGE-XR) 500 MG 24 hr tablet Take 2 tablets by mouth 2 (two) times daily before a meal.     metoprolol succinate (TOPROL-XL) 50 MG 24 hr tablet TAKE 1 TABLET(50 MG) BY MOUTH DAILY 90 tablet 3   montelukast (SINGULAIR) 10 MG tablet Take 1 tablet (10 mg total) by mouth at bedtime. 90 tablet 3   Multiple Vitamin (MULTIVITAMIN) tablet Take 1 tablet by mouth daily. Centrum Silver     Multiple Vitamins-Minerals (OCUVITE PO) Take 1 tablet by mouth daily.     Spacer/Aero-Holding Chambers (VALVED HOLDING CHAMBER) DEVI      TURMERIC PO Take 400 mg by mouth daily.     vitamin C (ASCORBIC ACID) 500 MG  tablet Take 500 mg by mouth daily.     No current facility-administered medications on file prior to visit.    Allergies  Allergen Reactions   Gabapentin Swelling   Cefdinir     REACTION: unspecified   Ciprofloxacin     rash   Clarithromycin     REACTION: weird dreams (on prednisone at the same time but has tolerated this in the past)   Miglitol     REACTION: unspecified   Niacin     REACTION: unspecified   Qvar [Beclomethasone] Other (See Comments)    Feels funny Feels funny    Past Medical History:  Diagnosis Date   Allergy    Asthma    Diabetes mellitus    Diastolic dysfunction    a. 06/2019 Echo: EF 60-65%, no rwma, GR1 DD. Mild MR. Nl RV fxn.   Essential hypertension    GERD (gastroesophageal reflux disease)     Hyperlipidemia    Mixed hyperlipidemia    Osteopenia    PAF (paroxysmal atrial fibrillation) (HCC)    a. Dx in late 1990's - no known recurrence.   Sleep disorder    Thyroid disease     Past Surgical History:  Procedure Laterality Date   CERVICAL CONE BIOPSY  1960's   CHOLECYSTECTOMY     NASAL POLYP SURGERY  01/01   STRABISMUS SURGERY  1946 / 1967   VAGINAL DELIVERY     x2    Family History  Problem Relation Age of Onset   Asthma Mother    Coronary artery disease Father     Social History   Socioeconomic History   Marital status: Widowed    Spouse name: Not on file   Number of children: 2   Years of education: Not on file   Highest education level: Not on file  Occupational History    Employer: RETIRED  Tobacco Use   Smoking status: Never    Passive exposure: Never   Smokeless tobacco: Never  Vaping Use   Vaping Use: Never used  Substance and Sexual Activity   Alcohol use: Not Currently    Alcohol/week: 1.0 standard drink of alcohol    Types: 1 Glasses of wine per week    Comment: 1 glass of wine per day   Drug use: No   Sexual activity: Not Currently  Other Topics Concern   Not on file  Social History Narrative   Widowed 3/23      Has living will   Not sure about formal POA --but requests daughter [name] (and son [name]) to be health care POAs   Would accept resuscitation attempts   Not sure about feeding tubes   Social Determinants of Health   Financial Resource Strain: Not on file  Food Insecurity: Not on file  Transportation Needs: Not on file  Physical Activity: Not on file  Stress: Not on file  Social Connections: Not on file  Intimate Partner Violence: Not on file   Review of Systems Not eating well--daughter is prompting her Has had a 360 cal boost every day Restless sleep last night--some ongoing pain issues (having CTS repair on right next week)    Objective:   Physical Exam Constitutional:      General: She is not in acute  distress.    Comments: Looks a little washed out  Cardiovascular:     Rate and Rhythm: Normal rate and regular rhythm.     Heart sounds: No murmur heard.    No gallop.  Pulmonary:     Effort: Pulmonary effort is normal.     Breath sounds: Normal breath sounds. No wheezing or rales.  Abdominal:     Palpations: Abdomen is soft.     Tenderness: There is no abdominal tenderness.  Musculoskeletal:     Cervical back: Neck supple.     Right lower leg: No edema.     Left lower leg: No edema.  Lymphadenopathy:     Cervical: No cervical adenopathy.  Neurological:     Mental Status: She is alert.            Assessment & Plan:

## 2022-05-04 NOTE — Assessment & Plan Note (Signed)
BP Readings from Last 3 Encounters:  05/04/22 122/80  05/03/22 120/74  04/05/22 130/76   Changed off losartan to amlodipine 5 (due to K=5.0) Could have been part of the reason for low sodium Also metoprolol 50 daily

## 2022-05-04 NOTE — Assessment & Plan Note (Signed)
Recent issues have been very difficult--on top of grieving Will do Behavioral Medicine referral

## 2022-05-04 NOTE — Assessment & Plan Note (Signed)
And general GI issues Clearly seems to be related to the metformin and is serious Will stop the metformin for now---if better in a week, can titrate back to 500 bid (highest tolerated dose)

## 2022-05-04 NOTE — Assessment & Plan Note (Signed)
Probably multifactorial Off losartan Hopefully better volume status if we can stop the diarrhea

## 2022-05-05 DIAGNOSIS — Z794 Long term (current) use of insulin: Secondary | ICD-10-CM | POA: Diagnosis not present

## 2022-05-05 DIAGNOSIS — E1165 Type 2 diabetes mellitus with hyperglycemia: Secondary | ICD-10-CM | POA: Diagnosis not present

## 2022-05-05 DIAGNOSIS — E1159 Type 2 diabetes mellitus with other circulatory complications: Secondary | ICD-10-CM | POA: Diagnosis not present

## 2022-05-05 DIAGNOSIS — E113293 Type 2 diabetes mellitus with mild nonproliferative diabetic retinopathy without macular edema, bilateral: Secondary | ICD-10-CM | POA: Diagnosis not present

## 2022-05-05 DIAGNOSIS — I152 Hypertension secondary to endocrine disorders: Secondary | ICD-10-CM | POA: Diagnosis not present

## 2022-05-11 DIAGNOSIS — G5603 Carpal tunnel syndrome, bilateral upper limbs: Secondary | ICD-10-CM | POA: Diagnosis not present

## 2022-05-14 ENCOUNTER — Other Ambulatory Visit: Payer: Self-pay | Admitting: Internal Medicine

## 2022-05-19 ENCOUNTER — Other Ambulatory Visit: Payer: Self-pay | Admitting: Internal Medicine

## 2022-05-19 NOTE — Telephone Encounter (Signed)
Last filled 02-28-22 #60 Last OV 05-04-22 Next OV 09-15-22 [name] - [place], [place] - [address] OF [address]

## 2022-05-27 DIAGNOSIS — N898 Other specified noninflammatory disorders of vagina: Secondary | ICD-10-CM | POA: Diagnosis not present

## 2022-05-27 DIAGNOSIS — B3731 Acute candidiasis of vulva and vagina: Secondary | ICD-10-CM | POA: Diagnosis not present

## 2022-06-01 ENCOUNTER — Telehealth: Payer: Self-pay | Admitting: Internal Medicine

## 2022-06-01 NOTE — Telephone Encounter (Signed)
[facility] Day - Client TELEPHONE ADVICE RECORD [facility] Patient Name: [name] Gender: Female DOB: [birth year] Age: 88 Y 4 M 3 D Return Phone Number: [phone] (Primary) Address: City/ State/ Zip: [place]  [address] Client [facility] Day - Client Client Site [facility] - Day Provider Tillman Abide- MD Contact Type Call Who Is Calling Patient / Member / Family / Caregiver Call Type Triage / Clinical Caller Name Kaytlynn Kochan Relationship To Patient Daughter Return Phone Number [phone] (Primary) Chief Complaint CHEST PAIN - pain, pressure, heaviness or tightness Reason for Call Symptomatic / Request for Health Information Initial Comment Caller states her mother has pain going across her shoulders, across her body and down her legs, mostly in her limbs. Her mom is diabetic. They had provided the wrong diabetic medication at first which gave her health issues and this has been adjusted. She was put on and taken off Jardiance which gave her a yeast infection. Her mom recently had carpal tunnel surgery on her wrist. Her mom has severe CTS. Her mom has pinched nerves at the top of her spine and her neck. Today the pain has spread to her mother's hips. The daughter says the pain is getting progressively worse. The Podiatry clinic cannot see her until mid-August. Her daughter gives her Advil every four hours. Last night and this morning she took prescribed hydrocodone which is not helping. Her daughter says her mother wakes up crying in pain and her daughter does not know what to do. Her mother is not getting help from the ER. He blood suger is 130 to 180 in the morning and 200 to 300 before going to bed. She is taking Novolog shots. GOTO Facility Not [facility] Translation No Nurse Assessment Nurse: Shawnie Dapper, RN, Arline Asp Date/Time Lamount Cohen Time): 06/01/2022 12:45:25  PM Confirm and document reason for call. If symptomatic, describe symptoms. ---Caller states that her body has generalized pain across her arms, back, and down her legs. Caller states that she has a pinched nerve to her back and neck. Caller states that pain is moving down her hips now which is new. Does the patient have any new or worsening symptoms? ---Yes Will a triage be completed? ---Yes PLEASE NOTE: All timestamps contained within this report are represented as [place] Standard Time. CONFIDENTIALTY NOTICE: This fax transmission is intended only for the addressee. It contains information that is legally privileged, confidential or otherwise protected from use or disclosure. If you are not the intended recipient, you are strictly prohibited from reviewing, disclosing, copying using or disseminating any of this information or taking any action in reliance on or regarding this information. If you have received this fax in error, please notify us immediately by telephone so that we can arrange for its return to [place]. Phone: [phone], Toll-Free: [phone], Fax: [phone] Page: 2 of 2 Call Id: [id number] Nurse Assessment Related visit to physician within the last 2 weeks? ---No Does the PT have any chronic conditions? (i.e. diabetes, asthma, this includes High risk factors for pregnancy, etc.) ---Yes List chronic conditions. ---DM, Is this a behavioral health or substance abuse call? ---No Guidelines Guideline Title Affirmed Question Affirmed Notes Nurse Date/Time (Eastern Time) Back Pain [1] SEVERE back pain (e.g., excruciating, unable to do any normal activities) AND [2] not improved 2 hours after pain medicine Shawnie Dapper, RN, Cindy 06/01/2022 12:52:18 PM Disp. Time Lamount Cohen Time) Disposition Final User 06/01/2022 12:43:52 PM Send to Urgent Queue  Quentin Angst 06/01/2022 1:10:15 PM See HCP within 4 Hours (or PCP triage) Yes Shawnie Dapper, RN, Arline Asp Final Disposition 06/01/2022  1:10:15 PM See HCP within 4 Hours (or PCP triage) Yes Shawnie Dapper, RN, Ave Filter Disagree/Comply Comply Caller Understands Yes PreDisposition Did not know what to do Care Advice Given Per Guideline SEE HCP (OR PCP TRIAGE) WITHIN 4 HOURS: * IF OFFICE WILL BE OPEN: You need to be seen within the next 3 or 4 hours. Call your doctor (or NP/PA) now or as soon as the office opens. CALL BACK IF: * You become worse CARE ADVICE given per Back Pain (Adult) guideline. Referrals GO TO FACILITY OTHER - SPECIF

## 2022-06-01 NOTE — Telephone Encounter (Signed)
I spoke with Melinda(DPR signed) and pt did not go to [facility] ED at [facility]. Because she is feeling some better and pt already has appt with Mort Sawyers FNP on 06/02/22 at 8:40 and if pt condition worsens [name] said she will take pt to ED. Sending note to T Dugal FNP, Tresa Endo CMA and Dr Alphonsus Sias as PCP who is out of office this afternoon.

## 2022-06-01 NOTE — Telephone Encounter (Signed)
Patient daughter [name] called in stating [name] woke up crying this morning in pain. [name] stated that her pain is worsening. She has pain across her shoulders, down her back and legs. She stated she is having a hard time moving. Patient was triaged.

## 2022-06-02 ENCOUNTER — Encounter: Payer: Self-pay | Admitting: Family

## 2022-06-02 ENCOUNTER — Ambulatory Visit (INDEPENDENT_AMBULATORY_CARE_PROVIDER_SITE_OTHER): Payer: Medicare PPO | Admitting: Family

## 2022-06-02 ENCOUNTER — Ambulatory Visit (INDEPENDENT_AMBULATORY_CARE_PROVIDER_SITE_OTHER)
Admission: RE | Admit: 2022-06-02 | Discharge: 2022-06-02 | Disposition: A | Discharge status: Home / Self Care | Payer: Medicare PPO | Source: Ambulatory Visit | Attending: Family | Admitting: Family

## 2022-06-02 VITALS — BP 120/76 | HR 71 | Temp 98.1°F | Resp 16 | Wt 166.4 lb

## 2022-06-02 DIAGNOSIS — M5441 Lumbago with sciatica, right side: Secondary | ICD-10-CM | POA: Diagnosis not present

## 2022-06-02 DIAGNOSIS — M5442 Lumbago with sciatica, left side: Secondary | ICD-10-CM

## 2022-06-02 DIAGNOSIS — E039 Hypothyroidism, unspecified: Secondary | ICD-10-CM | POA: Diagnosis not present

## 2022-06-02 DIAGNOSIS — E871 Hypo-osmolality and hyponatremia: Secondary | ICD-10-CM

## 2022-06-02 DIAGNOSIS — M62838 Other muscle spasm: Secondary | ICD-10-CM

## 2022-06-02 DIAGNOSIS — R5383 Other fatigue: Secondary | ICD-10-CM | POA: Diagnosis not present

## 2022-06-02 DIAGNOSIS — M545 Low back pain, unspecified: Secondary | ICD-10-CM | POA: Diagnosis not present

## 2022-06-02 DIAGNOSIS — M5136 Other intervertebral disc degeneration, lumbar region: Secondary | ICD-10-CM | POA: Diagnosis not present

## 2022-06-02 LAB — BASIC METABOLIC PANEL
BUN: 20 mg/dL (ref 6–23)
CO2: 28 mEq/L (ref 19–32)
Calcium: 9.1 mg/dL (ref 8.4–10.5)
Chloride: 94 mEq/L — ABNORMAL LOW (ref 96–112)
Creatinine, Ser: 0.6 mg/dL (ref 0.40–1.20)
GFR: 79.62 mL/min (ref >60.00)
Glucose, Bld: 294 mg/dL — ABNORMAL HIGH (ref 70–99)
Potassium: 4.8 mEq/L (ref 3.5–5.1)
Sodium: 134 mEq/L — ABNORMAL LOW (ref 135–145)

## 2022-06-02 LAB — TSH: TSH: 2.77 u[IU]/mL (ref 0.35–5.50)

## 2022-06-02 MED ORDER — TIZANIDINE HCL 2 MG PO TABS: ORAL_TABLET | ORAL | 0 refills | Status: DC

## 2022-06-02 MED ORDER — FAMOTIDINE 20 MG PO TABS
20.0000 mg | ORAL_TABLET | Freq: Every day | ORAL | 1 refills | Status: DC
Start: 2022-06-02 — End: 2022-06-02

## 2022-06-02 MED ORDER — FAMOTIDINE 10 MG PO TABS: 10.0000 mg | ORAL_TABLET | Freq: Two times a day (BID) | ORAL | 1 refills | Status: DC

## 2022-06-02 NOTE — Telephone Encounter (Signed)
Noted  

## 2022-06-02 NOTE — Assessment & Plan Note (Signed)
Slightly elevated tsh last visit one month ago Repeat today, as can contribute to joint pains

## 2022-06-02 NOTE — Telephone Encounter (Signed)
Thank you agree with recommendations.  Was seen by me today in office.  Please refer to note for further concerns.

## 2022-06-02 NOTE — Progress Notes (Signed)
Sodium has improved.  Thyroid back to normal.  Elevated glucose, last a1c 8.7. continue with diabetic diet, f/u with [facility] as indicated for this.

## 2022-06-02 NOTE — Telephone Encounter (Signed)
Was seen today .

## 2022-06-02 NOTE — Progress Notes (Signed)
Established Patient Office Visit  Subjective:  Patient ID: [name], female    DOB: [birth year]  Age: 88 y.o. MRN: [id number]  CC:  Chief Complaint  Patient presents with   Back Pain    X 3 days starts in her hand goes to shoulder and down the other hair. Now is has moved to hips.   Foot Swelling    HPI [name] is here today with concerns.   C/o pain from neck to bil lower hands. She was seen by orthosurg and they did see some herniations with neck. Was told to have carpal surgery first (which has been about a month ago) and still with ongoing neck pain so orthosurg stated that they will revisit neck pain post op.   She used to be able to get out of bed, and now hard to get out of bed. Thinks less core muscles as well. She c/o hip pain on the bil lateral sides of the bed. She now has to be lifted up by her daughter and has to swing her legs over to get out of bed. Left >right hip pain.   Daughter with her and states that mom has been c/o bil hip pain. Tried hydrocodone prior to bed leftover from surgery, and still with major pain when she woke up in the am. Took some advil , taking 2 every six hours with some mild relief. This has been for the last two days.   No increasing fatigue, but does feel tired often. Has slowly been gaining weight since June.  Wt Readings from Last 3 Encounters:  06/02/22 166 lb 6 oz (75.5 kg)  05/04/22 165 lb (74.8 kg)  05/03/22 164 lb (74.4 kg)    Recent right carpal tunnel syndrome.   Last tsh slightly elevated.  Lab Results  Component Value Date   TSH 5.76 (H) 05/02/2022   Last visit wbc slightly elevated. Slight anemia. Lab Results  Component Value Date   WBC 10.6 (H) 05/02/2022   HGB 11.3 (L) 05/02/2022   HCT 32.9 (L) 05/02/2022   MCV 83.0 05/02/2022   PLT 501.0 (H) 05/02/2022   Low sodium at 126 last lab draw. Prior had been hospit[facility] at [facility] treated for sodium 119. Last visit with Dr. Silvio Pate changed to  amlodipine 5 mg , stopped losartan due to high potassium.   DM2: on lantus 28, last visit decreased metformin 500 mg bid if tolerated otherwise to d/c and to consider jardiance 10 mg. She did start jardiance but caused yeast infection so d/c. Lab Results  Component Value Date   HGBA1C 8.7 (H) 05/02/2022    She is no longer taking jardiance.   Has not restarted metformin.   She is on novolog with meals, eats twice a day.   She sees endocrinologist, Dr. Gabriel Carina. Has f/u august 6th.   Still taking magnesium, no longer with diarrhea.   Past Medical History:  Diagnosis Date   Allergy    Asthma    Diabetes mellitus    Diastolic dysfunction    a. 06/2019 Echo: EF 60-65%, no rwma, GR1 DD. Mild MR. Nl RV fxn.   Essential hypertension    GERD (gastroesophageal reflux disease)    Hyperlipidemia    Mixed hyperlipidemia    Osteopenia    PAF (paroxysmal atrial fibrillation) ([place])    a. Dx in late 1990's - no known recurrence.   Sleep disorder    Thyroid disease     Past Surgical History:  Procedure Laterality Date   CERVICAL CONE BIOPSY  1960's   CHOLECYSTECTOMY     NASAL POLYP SURGERY  01/01   STRABISMUS SURGERY  1946 / 1967   VAGINAL DELIVERY     x2    Family History  Problem Relation Age of Onset   Asthma Mother    Coronary artery disease Father     Social History   Socioeconomic History   Marital status: Widowed    Spouse name: Not on file   Number of children: 2   Years of education: Not on file   Highest education level: Not on file  Occupational History    Employer: RETIRED  Tobacco Use   Smoking status: Never    Passive exposure: Never   Smokeless tobacco: Never  Vaping Use   Vaping Use: Never used  Substance and Sexual Activity   Alcohol use: Not Currently    Alcohol/week: 1.0 standard drink of alcohol    Types: 1 Glasses of wine per week    Comment: 1 glass of wine per day   Drug use: No   Sexual activity: Not Currently  Other Topics Concern   Not on  file  Social History Narrative   Widowed 3/23      Has living will   Not sure about formal POA --but requests daughter [name] (and son [name]) to be health care POAs   Would accept resuscitation attempts   Not sure about feeding tubes   Social Determinants of Health   Financial Resource Strain: Not on file  Food Insecurity: Not on file  Transportation Needs: Not on file  Physical Activity: Not on file  Stress: Not on file  Social Connections: Not on file  Intimate Partner Violence: Not on file    Outpatient Medications Prior to Visit  Medication Sig Dispense Refill   ACCU-CHEK SMARTVIEW test strip   3   acetaminophen (TYLENOL) 500 MG tablet Take 500 mg by mouth every 8 (eight) hours as needed.     albuterol (VENTOLIN HFA) 108 (90 Base) MCG/ACT inhaler Inhale 2 puffs into the lungs every 6 (six) hours as needed. 18 g 1   ALPRAZolam (XANAX) 0.25 MG tablet TAKE 1 TABLET(0.25 MG) BY MOUTH TWICE DAILY AS NEEDED FOR ANXIETY 60 tablet 0   amLODipine (NORVASC) 5 MG tablet Take 1 tablet (5 mg total) by mouth daily. 90 tablet 1   aspirin 81 MG tablet Take 81 mg by mouth daily.     atorvastatin (LIPITOR) 20 MG tablet TAKE 1 TABLET BY MOUTH EVERY DAY 90 tablet 3   B-D UF III MINI PEN NEEDLES 31G X 5 MM MISC USE ONCE TO TWICE DAILY 200 each 4   B-D ULTRA-FINE 33 LANCETS MISC Use to test blood sugar twice daily dx: E11.9 200 each 3   calcitonin, salmon, (MIACALCIN/FORTICAL) 200 UNIT/ACT nasal spray USE 1 SPRAY NASALLY(ALTERNATING NOSTRILS) EVERY DAY 3.7 mL 11   cetirizine (ZYRTEC) 5 MG tablet Take 5 mg by mouth daily.     fluticasone (FLONASE) 50 MCG/ACT nasal spray Place 2 sprays into both nostrils daily as needed for allergies or rhinitis.     insulin glargine (LANTUS) 100 UNIT/ML Solostar Pen 28 Units daily.     levothyroxine (SYNTHROID, LEVOTHROID) 100 MCG tablet Take 100 mcg by mouth daily before breakfast.     MAGNESIUM PO Take 1 tablet by mouth in the morning and at bedtime.      meclizine (ANTIVERT) 25 MG tablet Take 1 tablet (25 mg total)  by mouth 3 (three) times daily as needed. 90 tablet 0   metoprolol succinate (TOPROL-XL) 50 MG 24 hr tablet TAKE 1 TABLET(50 MG) BY MOUTH DAILY 90 tablet 3   montelukast (SINGULAIR) 10 MG tablet Take 1 tablet (10 mg total) by mouth at bedtime. 90 tablet 3   Multiple Vitamin (MULTIVITAMIN) tablet Take 1 tablet by mouth daily. Centrum Silver     Multiple Vitamins-Minerals (OCUVITE PO) Take 1 tablet by mouth daily.     NOVOLOG FLEXPEN 100 UNIT/ML FlexPen SMARTSIG:8 Unit(s) SUB-Q 3 Times Daily     Spacer/Aero-Holding Chambers (VALVED HOLDING CHAMBER) DEVI      TURMERIC PO Take 400 mg by mouth daily.     vitamin C (ASCORBIC ACID) 500 MG tablet Take 500 mg by mouth daily.     diclofenac Sodium (VOLTAREN) 1 % GEL Apply 4 g topically 4 (four) times daily. (Patient not taking: Reported on 06/02/2022)     famotidine (PEPCID) 20 MG tablet Take 1 tablet by mouth daily. (Patient not taking: Reported on 06/02/2022)     metFORMIN (GLUCOPHAGE-XR) 500 MG 24 hr tablet Take 2 tablets by mouth 2 (two) times daily before a meal. (Patient not taking: Reported on 06/02/2022)     No facility-administered medications prior to visit.    Allergies  Allergen Reactions   Gabapentin Swelling   Cefdinir     REACTION: unspecified   Ciprofloxacin     rash   Clarithromycin     REACTION: weird dreams (on prednisone at the same time but has tolerated this in the past)   Diclofenac Dermatitis    Topical only  Causes burning to site   Miglitol     REACTION: unspecified   Niacin     REACTION: unspecified   Qvar [Beclomethasone] Other (See Comments)    Feels funny Feels funny   Jardiance [Empagliflozin] Other (See Comments)    Yeast infection    Metformin And Related Diarrhea        Objective:    Physical Exam Constitutional:      General: She is not in acute distress.    Appearance: Normal appearance. She is obese. She is not ill-appearing,  toxic-appearing or diaphoretic.  Cardiovascular:     Rate and Rhythm: Normal rate and regular rhythm.  Pulmonary:     Effort: Pulmonary effort is normal.  Musculoskeletal:     Lumbar back: Tenderness (tenderness to bil sciatic notch with spasm palpated) present. Decreased range of motion (painful rom to right or left). Positive right straight leg raise test and positive left straight leg raise test.     Right upper leg: Tenderness (tenderness along lateral thigh) present.     Left upper leg: Tenderness (tenderness along lateral thigh) present.     Comments: Weakness bil lower legs upon standing with stiffness , improves with standing  Skin:    General: Skin is warm.  Neurological:     General: No focal deficit present.     Mental Status: She is alert and oriented to person, place, and time.  Psychiatric:        Mood and Affect: Mood normal.        Behavior: Behavior normal.        Thought Content: Thought content normal.        Judgment: Judgment normal.     BP 120/76   Pulse 71   Temp 98.1 F (36.7 C)   Resp 16   Wt 166 lb 6 oz (75.5 kg)  SpO2 96%   BMI 29.47 kg/m  Wt Readings from Last 3 Encounters:  06/02/22 166 lb 6 oz (75.5 kg)  05/04/22 165 lb (74.8 kg)  05/03/22 164 lb (74.4 kg)     Health Maintenance Due  Topic Date Due   Zoster Vaccines- Shingrix (1 of 2) Never done   DEXA SCAN  Never done   COVID-19 Vaccine (4 - Pfizer series) 12/05/2020   FOOT EXAM  09/03/2021    There are no preventive care reminders to display for this patient.  Lab Results  Component Value Date   TSH 5.76 (H) 05/02/2022   Lab Results  Component Value Date   WBC 10.6 (H) 05/02/2022   HGB 11.3 (L) 05/02/2022   HCT 32.9 (L) 05/02/2022   MCV 83.0 05/02/2022   PLT 501.0 (H) 05/02/2022   Lab Results  Component Value Date   NA 126 (L) 05/02/2022   K 5.0 05/02/2022   CO2 27 05/02/2022   GLUCOSE 241 (H) 05/02/2022   BUN 18 05/02/2022   CREATININE 0.64 05/02/2022   BILITOT 0.4  05/02/2022   ALKPHOS 74 05/02/2022   AST 13 05/02/2022   ALT 13 05/02/2022   PROT 6.5 05/02/2022   ALBUMIN 3.5 05/02/2022   ALBUMIN 3.5 05/02/2022   CALCIUM 9.1 05/02/2022   ANIONGAP 10 09/25/2021   GFR 78.44 05/02/2022   Lab Results  Component Value Date   HGBA1C 8.7 (H) 05/02/2022      Assessment & Plan:   Problem List Items Addressed This Visit       Endocrine   Hypothyroidism    Slightly elevated tsh last visit one month ago Repeat today, as can contribute to joint pains      Relevant Orders   TSH     Nervous and Auditory   Low back pain due to bilateral sciatica    Xray lumbar spine today to r/o any disc herniation contributing to nerve pain  High strength tylenol ok  Stop nsaids, causing fluid retention  Exercises as tolerated Pt with physical therapy, going to ask to also incorporate for this.  Heat to site, warm compresses  Tizanidine low dose sent in prn, pt advised will cause drowsiness use with caution       Relevant Medications   tiZANidine (ZANAFLEX) 2 MG tablet   Other Relevant Orders   DG Lumbar Spine Complete     Other   Hyponatremia    Repeat today ordered bmp      Relevant Orders   Basic metabolic panel   Muscle spasm    Tizanidine 2 mg prn       Relevant Medications   tiZANidine (ZANAFLEX) 2 MG tablet   Other Relevant Orders   DG Lumbar Spine Complete   Other fatigue - Primary   Relevant Medications   famotidine (PEPCID AC) 10 MG tablet    Meds ordered this encounter  Medications   DISCONTD: famotidine (PEPCID) 20 MG tablet    Sig: Take 1 tablet (20 mg total) by mouth daily.    Dispense:  90 tablet    Refill:  1    Order Specific Question:   Supervising Provider    Answer:   BEDSOLE, AMY E [[id number]]   famotidine (PEPCID AC) 10 MG tablet    Sig: Take 1 tablet (10 mg total) by mouth 2 (two) times daily.    Dispense:  90 tablet    Refill:  1    Order Specific Question:   Supervising Provider  Answer:   BEDSOLE, AMY E  [[id number]]   tiZANidine (ZANAFLEX) 2 MG tablet    Sig: Take one po qhs prn muscle spasm    Dispense:  30 tablet    Refill:  0    Order Specific Question:   Supervising Provider    Answer:   Ermalene Searing, AMY E [[id number]]    Follow-up: Return in about 1 month (around 07/03/2022) for with Dr. Alphonsus Sias .    Mort Sawyers, FNP

## 2022-06-02 NOTE — Assessment & Plan Note (Signed)
Tizanidine 2 mg prn

## 2022-06-02 NOTE — Assessment & Plan Note (Signed)
Repeat today ordered bmp

## 2022-06-02 NOTE — Patient Instructions (Addendum)
Recommend tylenol arthritis for the sciatica .   Work on sciatica exercises. Massage as able.  Take tizanidine as needed for muscle spasms. This will make you very tired.   Stop by the lab prior to leaving today. I will notify you of your results once received.   Due to recent changes in healthcare laws, you may see results of your imaging and/or laboratory studies on MyChart before I have had a chance to review them.  I understand that in some cases there may be results that are confusing or concerning to you. Please understand that not all results are received at the same time and often I may need to interpret multiple results in order to provide you with the best plan of care or course of treatment. Therefore, I ask that you please give me 2 business days to thoroughly review all your results before contacting my office for clarification. Should we see a critical lab result, you will be contacted sooner.   It was a pleasure seeing you today! Please do not hesitate to reach out with any questions and or concerns.  Regards,   Mort Sawyers FNP-C

## 2022-06-02 NOTE — Assessment & Plan Note (Signed)
Xray lumbar spine today to r/o any disc herniation contributing to nerve pain  High strength tylenol ok  Stop nsaids, causing fluid retention  Exercises as tolerated Pt with physical therapy, going to ask to also incorporate for this.  Heat to site, warm compresses  Tizanidine low dose sent in prn, pt advised will cause drowsiness use with caution

## 2022-06-03 NOTE — Progress Notes (Signed)
There is scoliosis (curvature of the spine) and aging changes in the lower back. These could be contributing to the pain. For now let's continue with tylenol   Can try tumeric over the counter, just watch your blood pressure.

## 2022-06-09 ENCOUNTER — Telehealth: Payer: Self-pay | Admitting: Internal Medicine

## 2022-06-09 DIAGNOSIS — H6123 Impacted cerumen, bilateral: Secondary | ICD-10-CM | POA: Diagnosis not present

## 2022-06-09 DIAGNOSIS — H903 Sensorineural hearing loss, bilateral: Secondary | ICD-10-CM | POA: Diagnosis not present

## 2022-06-09 NOTE — Telephone Encounter (Signed)
Verbal orders left on VM for [place].

## 2022-06-09 NOTE — Telephone Encounter (Signed)
HH ORDERS   Caller Name: [facility] Agency Name: [facility] Phone #: [phone] Secure line  Service Requested: OT (examples: OT/PT/Skilled Nursing/Social Work/Speech Therapy/Wound Care)  Frequency of Visits: once a week for 4 weeks

## 2022-06-17 ENCOUNTER — Telehealth: Payer: Self-pay | Admitting: Internal Medicine

## 2022-06-17 NOTE — Telephone Encounter (Signed)
Home Health verbal orders Caller Name:Melissa [facility] Agency Name: [facility]  Callback number: [phone]  Requesting OT/PT/Skilled nursing/Social Work/Speech:  Reason:Patient missed OT  Frequency:  Please forward to [facility] pool or providers CMA

## 2022-06-20 DIAGNOSIS — E669 Obesity, unspecified: Secondary | ICD-10-CM | POA: Diagnosis not present

## 2022-06-20 DIAGNOSIS — E1169 Type 2 diabetes mellitus with other specified complication: Secondary | ICD-10-CM | POA: Diagnosis not present

## 2022-06-20 DIAGNOSIS — E113293 Type 2 diabetes mellitus with mild nonproliferative diabetic retinopathy without macular edema, bilateral: Secondary | ICD-10-CM | POA: Diagnosis not present

## 2022-06-20 DIAGNOSIS — Z794 Long term (current) use of insulin: Secondary | ICD-10-CM | POA: Diagnosis not present

## 2022-06-20 DIAGNOSIS — E1165 Type 2 diabetes mellitus with hyperglycemia: Secondary | ICD-10-CM | POA: Diagnosis not present

## 2022-06-20 DIAGNOSIS — E063 Autoimmune thyroiditis: Secondary | ICD-10-CM | POA: Diagnosis not present

## 2022-06-20 NOTE — Telephone Encounter (Signed)
Left message on verified VM with Dr Karle Starch note.

## 2022-06-21 ENCOUNTER — Ambulatory Visit (INDEPENDENT_AMBULATORY_CARE_PROVIDER_SITE_OTHER): Payer: Medicare PPO | Admitting: Psychologist

## 2022-06-21 DIAGNOSIS — Z634 Disappearance and death of family member: Secondary | ICD-10-CM | POA: Diagnosis not present

## 2022-06-21 DIAGNOSIS — F320 Major depressive disorder, single episode, mild: Secondary | ICD-10-CM | POA: Diagnosis not present

## 2022-06-21 NOTE — Progress Notes (Signed)
                Manoj Enriquez, PsyD 

## 2022-06-21 NOTE — Plan of Care (Signed)
Goals Begin a healthy grieving process Objectives Tell in detail the story of the current loss that is triggering symptoms Read books on the topic of grief Watch videos on the theme of grief Begin verbalizing feelings associated with the loss Attend a grief support group express thoughts and feelings about the deceased Identify and voice positives about the deceased implement acts of spiritual faith  Interventions create a safe environment and actively build trust use empathy, compassion, and support ask the patient to write a letter to the lost person conduct empty chair ask the patient to discuss and list the positives and negative aspects of the person encourage patient to rely upon his/her spiritual faith  ask client to read books on grief ask patient to watch videos about grief assist patient in identifying emotions  ask patient to attend support group   

## 2022-06-21 NOTE — Progress Notes (Signed)
[facility] Counselor Initial Adult Exam  Name: [name] Date: 06/21/2022 MRN: [id number] DOB: 1931/04/04 PCP: Tillman Abide I, MD  Time spent: 2:55 pm to 3:40 pm; total time: 45 minutes  This session was held via in person. The patient consented to in-person therapy and was in the clinician's office. Limits of confidentiality were discussed with the patient.   Guardian/Payee:  NA    Paperwork requested: No   Reason for Visit /Presenting Problem: Grief  Mental Status Exam: Appearance:   Well Groomed     Behavior:  Appropriate  Motor:  Normal  Speech/Language:   Clear and Coherent  Affect:  Appropriate  Mood:  normal  Thought process:  normal  Thought content:    WNL  Sensory/Perceptual disturbances:    WNL  Orientation:  oriented to person, place, and time/date  Attention:  Good  Concentration:  Good  Memory:  WNL  Fund of knowledge:   Good  Insight:    Good  Judgment:   Good  Impulse Control:  Good     Reported Symptoms:  The patient endorsed experiencing the following: feeling down, sad, tearful, fatigue, rumination of thoughts, and social isolation. She denied suicidal and homicidal ideation.   Risk Assessment: Danger to Self:  No Self-injurious Behavior: No Danger to Others: No Duty to Warn:no Physical Aggression / Violence:No  Access to Firearms a concern: No  Gang Involvement:No  Patient / guardian was educated about steps to take if suicide or homicide risk level increases between visits: n/a While future psychiatric events cannot be accurately predicted, the patient does not currently require acute inpatient psychiatric care and does not currently meet [facility] involuntary commitment criteria.  Substance Abuse History: Current substance abuse: No     Past Psychiatric History:   No previous psychological problems have been observed Outpatient Providers:NA History of Psych Hospitalization: No  Psychological Testing:  NA     Abuse History:  Victim of: No.,  NA    Report needed: No. Victim of Neglect:No. Perpetrator of  NA   Witness / Exposure to Domestic Violence: No   Protective Services Involvement: No  Witness to [employer] Violence:  No   Family History:  Family History  Problem Relation Age of Onset   Asthma Mother    Coronary artery disease Father     Living situation: the patient lives with their family  Sexual Orientation: Straight  Relationship Status: widowed  Name of spouse / other:[name]. They were married for 62 years.  If a parent, number of children / ages:Patient has a son and a daughter.   Support Systems: Family  Financial Stress:  No   Income/Employment/Disability: Neurosurgeon: No   Educational History: Education: Risk manager: Christian/Catholic  Any cultural differences that may affect / interfere with treatment:  Originally from [place], [place]  Recreation/Hobbies: Being outdoors  Stressors: Other: Grief    Strengths: Supportive Relationships  Barriers:  NA   Legal History: Pending legal issue / charges: The patient has no significant history of legal issues. History of legal issue / charges:  NA  Medical History/Surgical History: reviewed Past Medical History:  Diagnosis Date   Allergy    Asthma    Diabetes mellitus    Diastolic dysfunction    a. 06/2019 Echo: EF 60-65%, no rwma, GR1 DD. Mild MR. Nl RV fxn.   Essential hypertension    GERD (gastroesophageal reflux disease)    Hyperlipidemia    Mixed  hyperlipidemia    Osteopenia    PAF (paroxysmal atrial fibrillation) ([facility])    a. Dx in late 1990's - no known recurrence.   Sleep disorder    Thyroid disease     Past Surgical History:  Procedure Laterality Date   CERVICAL CONE BIOPSY  1960's   CHOLECYSTECTOMY     NASAL POLYP SURGERY  01/01   STRABISMUS SURGERY  1946 / 1967   VAGINAL DELIVERY     x2     Medications: Current Outpatient Medications  Medication Sig Dispense Refill   ACCU-CHEK SMARTVIEW test strip   3   acetaminophen (TYLENOL) 500 MG tablet Take 500 mg by mouth every 8 (eight) hours as needed.     albuterol (VENTOLIN HFA) 108 (90 Base) MCG/ACT inhaler Inhale 2 puffs into the lungs every 6 (six) hours as needed. 18 g 1   ALPRAZolam (XANAX) 0.25 MG tablet TAKE 1 TABLET(0.25 MG) BY MOUTH TWICE DAILY AS NEEDED FOR ANXIETY 60 tablet 0   amLODipine (NORVASC) 5 MG tablet Take 1 tablet (5 mg total) by mouth daily. 90 tablet 1   aspirin 81 MG tablet Take 81 mg by mouth daily.     atorvastatin (LIPITOR) 20 MG tablet TAKE 1 TABLET BY MOUTH EVERY DAY 90 tablet 3   B-D UF III MINI PEN NEEDLES 31G X 5 MM MISC USE ONCE TO TWICE DAILY 200 each 4   B-D ULTRA-FINE 33 LANCETS MISC Use to test blood sugar twice daily dx: E11.9 200 each 3   calcitonin, salmon, (MIACALCIN/FORTICAL) 200 UNIT/ACT nasal spray USE 1 SPRAY NASALLY(ALTERNATING NOSTRILS) EVERY DAY 3.7 mL 11   cetirizine (ZYRTEC) 5 MG tablet Take 5 mg by mouth daily.     famotidine (PEPCID AC) 10 MG tablet Take 1 tablet (10 mg total) by mouth 2 (two) times daily. 90 tablet 1   fluticasone (FLONASE) 50 MCG/ACT nasal spray Place 2 sprays into both nostrils daily as needed for allergies or rhinitis.     insulin glargine (LANTUS) 100 UNIT/ML Solostar Pen 28 Units daily.     levothyroxine (SYNTHROID, LEVOTHROID) 100 MCG tablet Take 100 mcg by mouth daily before breakfast.     MAGNESIUM PO Take 1 tablet by mouth in the morning and at bedtime.     meclizine (ANTIVERT) 25 MG tablet Take 1 tablet (25 mg total) by mouth 3 (three) times daily as needed. 90 tablet 0   metoprolol succinate (TOPROL-XL) 50 MG 24 hr tablet TAKE 1 TABLET(50 MG) BY MOUTH DAILY 90 tablet 3   montelukast (SINGULAIR) 10 MG tablet Take 1 tablet (10 mg total) by mouth at bedtime. 90 tablet 3   Multiple Vitamin (MULTIVITAMIN) tablet Take 1 tablet by mouth daily. Centrum Silver      Multiple Vitamins-Minerals (OCUVITE PO) Take 1 tablet by mouth daily.     NOVOLOG FLEXPEN 100 UNIT/ML FlexPen SMARTSIG:8 Unit(s) SUB-Q 3 Times Daily     Spacer/Aero-Holding Chambers (VALVED HOLDING CHAMBER) DEVI      tiZANidine (ZANAFLEX) 2 MG tablet Take one po qhs prn muscle spasm 30 tablet 0   TURMERIC PO Take 400 mg by mouth daily.     vitamin C (ASCORBIC ACID) 500 MG tablet Take 500 mg by mouth daily.     No current facility-administered medications for this visit.    Allergies  Allergen Reactions   Gabapentin Swelling   Cefdinir     REACTION: unspecified   Ciprofloxacin     rash   Clarithromycin  REACTION: weird dreams (on prednisone at the same time but has tolerated this in the past)   Diclofenac Dermatitis    Topical only  Causes burning to site   Miglitol     REACTION: unspecified   Niacin     REACTION: unspecified   Qvar [Beclomethasone] Other (See Comments)    Feels funny Feels funny   Jardiance [Empagliflozin] Other (See Comments)    Yeast infection    Metformin And Related Diarrhea    Diagnoses:  F32.0 major depressive affective disorder, single episode, mild and Z63.4 uncomplicated bereavement.   Plan of Care: The patient is an 88 year old woman of [place] heritage who was referred due to experiencing grief. The patient lives with her daughter. The patient meets criteria for a diagnosis of F32.0 major depressive affective disorder, single episode, mild based off of the following: feeling down, sad, tearful, fatigue, rumination of thoughts, and social isolation. She denied suicidal and homicidal ideation. She also meets criteria for a diagnosis of Z63.4 uncomplicated bereavement.   The patient stated that she wanted to work through her grief.  This psychologist makes the recommendation that the patient participate in therapy at least once a month. Possibly bi-weekly.    Hilbert Corrigan, PsyD

## 2022-06-27 ENCOUNTER — Other Ambulatory Visit: Payer: Self-pay | Admitting: Physical Medicine and Rehabilitation

## 2022-06-27 ENCOUNTER — Other Ambulatory Visit: Payer: Self-pay | Admitting: Internal Medicine

## 2022-06-27 DIAGNOSIS — M4802 Spinal stenosis, cervical region: Secondary | ICD-10-CM | POA: Diagnosis not present

## 2022-06-27 DIAGNOSIS — M5414 Radiculopathy, thoracic region: Secondary | ICD-10-CM

## 2022-06-27 DIAGNOSIS — M5412 Radiculopathy, cervical region: Secondary | ICD-10-CM | POA: Diagnosis not present

## 2022-06-27 DIAGNOSIS — M47812 Spondylosis without myelopathy or radiculopathy, cervical region: Secondary | ICD-10-CM | POA: Diagnosis not present

## 2022-06-28 ENCOUNTER — Telehealth: Payer: Self-pay | Admitting: Internal Medicine

## 2022-06-28 NOTE — Telephone Encounter (Signed)
Home Health verbal orders Caller Name: Junious Dresser Agency Name: [facility] number: [phone]  Requesting OT  Frequency: 2 weeks 4, 1 week 4  Please forward to [facility] pool or providers CMA

## 2022-06-28 NOTE — Telephone Encounter (Signed)
Last filled 05-19-22 #[id number] Last OV 06-02-22-23 Next OV 09-15-22 [id number] - [place], [place] - [address]

## 2022-06-29 NOTE — Telephone Encounter (Signed)
Verbal orders left on verified VM for [name] 

## 2022-07-04 ENCOUNTER — Ambulatory Visit
Admission: RE | Admit: 2022-07-04 | Discharge: 2022-07-04 | Disposition: A | Discharge status: Home / Self Care | Payer: Medicare PPO | Source: Ambulatory Visit | Attending: Physical Medicine and Rehabilitation | Admitting: Physical Medicine and Rehabilitation

## 2022-07-04 DIAGNOSIS — M5414 Radiculopathy, thoracic region: Secondary | ICD-10-CM | POA: Insufficient documentation

## 2022-07-04 DIAGNOSIS — M546 Pain in thoracic spine: Secondary | ICD-10-CM | POA: Diagnosis not present

## 2022-07-08 ENCOUNTER — Telehealth: Payer: Self-pay | Admitting: Internal Medicine

## 2022-07-08 NOTE — Telephone Encounter (Signed)
Melissa from [facility] called in and stated that patient missed her OT appointment.

## 2022-07-08 NOTE — Telephone Encounter (Signed)
Noted  

## 2022-07-25 DIAGNOSIS — M4802 Spinal stenosis, cervical region: Secondary | ICD-10-CM | POA: Diagnosis not present

## 2022-07-25 DIAGNOSIS — M5414 Radiculopathy, thoracic region: Secondary | ICD-10-CM | POA: Diagnosis not present

## 2022-07-25 DIAGNOSIS — M47812 Spondylosis without myelopathy or radiculopathy, cervical region: Secondary | ICD-10-CM | POA: Diagnosis not present

## 2022-07-25 DIAGNOSIS — M4804 Spinal stenosis, thoracic region: Secondary | ICD-10-CM | POA: Diagnosis not present

## 2022-07-25 DIAGNOSIS — M5412 Radiculopathy, cervical region: Secondary | ICD-10-CM | POA: Diagnosis not present

## 2022-07-27 NOTE — Telephone Encounter (Signed)
Pt daughter called in requesting a call back stated pt is having some back pain and urine issues would like to discuss . [phone]

## 2022-07-27 NOTE — Telephone Encounter (Signed)
She has another appt tomorrow afternoon. That's the reason she made a morning appt with Dr Milinda Antis. Left message to call if they had any issues.

## 2022-07-27 NOTE — Telephone Encounter (Addendum)
Urinary issues started last night with nocturia. Had to change her Depends twice. No dysuria. Had a yeast infection last month. Trying to hold her urine in her bladder more during the day to try and strengthen the muscles around. Asking if that can effect her kidneys. Says she feel fine right now. Pt thought she had a fever but it was normal. She is scheduled to see Dr Milinda Antis tomorrow.

## 2022-07-28 ENCOUNTER — Encounter: Payer: Self-pay | Admitting: Family Medicine

## 2022-07-28 ENCOUNTER — Ambulatory Visit (INDEPENDENT_AMBULATORY_CARE_PROVIDER_SITE_OTHER): Payer: Medicare PPO | Admitting: Family Medicine

## 2022-07-28 VITALS — BP 150/82 | HR 70 | Temp 97.4°F | Ht 63.0 in | Wt 175.2 lb

## 2022-07-28 DIAGNOSIS — N3000 Acute cystitis without hematuria: Secondary | ICD-10-CM

## 2022-07-28 DIAGNOSIS — M545 Low back pain, unspecified: Secondary | ICD-10-CM

## 2022-07-28 LAB — POC URINALSYSI DIPSTICK (AUTOMATED)
Bilirubin, UA: NEGATIVE
Blood, UA: 25
Glucose, UA: NEGATIVE
Ketones, UA: NEGATIVE
Nitrite, UA: POSITIVE
Protein, UA: NEGATIVE
Spec Grav, UA: 1.015 (ref 1.010–1.025)
Urobilinogen, UA: 0.2 E.U./dL
pH, UA: 6 (ref 5.0–8.0)

## 2022-07-28 MED ORDER — NITROFURANTOIN MONOHYD MACRO 100 MG PO CAPS: 100.0000 mg | ORAL_CAPSULE | Freq: Two times a day (BID) | ORAL | 0 refills | Status: DC

## 2022-07-28 NOTE — Assessment & Plan Note (Addendum)
Pt cannot exactly explain her symptoms (? Chills/low back pain and unsure if urinary freq is her baseline or not) Pos ua and reassuring exam Is worried about her general kidney function and we reviewed her last labs from June and July  Px macrobid (had pos cephalosporin allergy)  Pending urine culture  She stays hydrated  Disc what to watch for  ER parameters discussed Handout given on utis

## 2022-07-28 NOTE — Patient Instructions (Signed)
I think you have a uti   Take the macrobid as directed   We will contact you with the culture result returns   If symptoms worsen or you have a fever or feel sick please let us know

## 2022-07-28 NOTE — Progress Notes (Signed)
Subjective:    Patient ID: [name], female    DOB: 08-28-1931, 88 y.o.   MRN: [id number]  HPI 88 yo pt of Dr Silvio Pate presents with low back pain and urinary urgency  Wt Readings from Last 3 Encounters:  07/28/22 175 lb 4 oz (79.5 kg)  06/02/22 166 lb 6 oz (75.5 kg)  05/04/22 165 lb (74.8 kg)   31.04 kg/m  Noted wt gain recently   Low back pain yesterday  Middle low  A little in flank area also  No dysuria  No blood in urine   Baseline frequent and urgency of urination  Does wear a pad   Gets yeast infections occasionally     Felt a little feverish / but no temp so far   Drinks a lot of water   Ua today  Results for orders placed or performed in visit on 07/28/22  POCT Urinalysis Dipstick (Automated)  Result Value Ref Range   Color, UA Yellow    Clarity, UA Cloudy    Glucose, UA Negative Negative   Bilirubin, UA Negative    Ketones, UA Negative    Spec Grav, UA 1.015 1.010 - 1.025   Blood, UA 25 Ery/uL    pH, UA 6.0 5.0 - 8.0   Protein, UA Negative Negative   Urobilinogen, UA 0.2 0.2 or 1.0 E.U./dL   Nitrite, UA Positive    Leukocytes, UA Large (3+) (A) Negative       Pt's family is worried because she takes tylenol regularly for arthritis and after carpal tunnel syndrome  Also drinks some alcohol - 2 drinks per week    Lab Results  Component Value Date   ALT 13 05/02/2022   AST 13 05/02/2022   ALKPHOS 74 05/02/2022   BILITOT 0.4 05/02/2022    Lab Results  Component Value Date   CREATININE 0.60 06/02/2022   BUN 20 06/02/2022   NA 134 (L) 06/02/2022   K 4.8 06/02/2022   CL 94 (L) 06/02/2022   CO2 28 06/02/2022   Amlodipine 5 mg for bp Also toprol xl for bp and a fib BP Readings from Last 3 Encounters:  07/28/22 (!) 150/82  06/02/22 120/76  05/04/22 122/80   Pulse Readings from Last 3 Encounters:  07/28/22 70  06/02/22 71  05/04/22 80   DM2 Lab Results  Component Value Date   HGBA1C 8.7 (H) 05/02/2022  Sees endocrinology   Medicines changed in February   Also lost her husband then and developed bad CTS Had surgery on R hand - getting better     Patient Active Problem List   Diagnosis Date Noted   Acute cystitis 07/28/2022   Muscle spasm 06/02/2022   Low back pain due to bilateral sciatica 06/02/2022   Other fatigue 06/02/2022   Diarrhea 05/04/2022   Hyponatremia 05/04/2022   Carpal tunnel syndrome on both sides 02/16/2022   Asthmatic bronchitis 11/04/2021   Allergic asthma 03/19/2021   Background diabetic retinopathy associated with type 2 diabetes mellitus ([place]) 06/18/2019   Aortic atherosclerosis ([place]) 04/22/2017   Advance directive discussed with patient 02/02/2017   Esophageal dysphagia 10/21/2016   Asthma, chronic 10/15/2013   Routine general medical examination at a health care facility 03/19/2012   Diabetes mellitus due to underlying condition, controlled, with both eyes affected by mild nonproliferative retinopathy without macular edema, with long-term current use of insulin ([place]) 01/16/2012   Episodic mood disorder ([place]) 07/26/2010   BACK PAIN, LUMBAR, WITH RADICULOPATHY 06/30/2009  Essential hypertension 02/06/2008   Hypothyroidism 01/12/2007   Mixed hyperlipidemia 01/12/2007   Paroxysmal atrial fibrillation ([facility]) 01/12/2007   Allergic rhinitis 01/12/2007   GERD 01/12/2007   OSTEOPENIA 01/12/2007   Past Medical History:  Diagnosis Date   Allergy    Asthma    Diabetes mellitus    Diastolic dysfunction    a. 06/2019 Echo: EF 60-65%, no rwma, GR1 DD. Mild MR. Nl RV fxn.   Essential hypertension    GERD (gastroesophageal reflux disease)    Hyperlipidemia    Mixed hyperlipidemia    Osteopenia    PAF (paroxysmal atrial fibrillation) ([facility])    a. Dx in late 1990's - no known recurrence.   Sleep disorder    Thyroid disease    Past Surgical History:  Procedure Laterality Date   CERVICAL CONE BIOPSY  1960's   CHOLECYSTECTOMY     NASAL POLYP SURGERY  01/01   STRABISMUS SURGERY   1946 / 81   VAGINAL DELIVERY     x2   Social History   Tobacco Use   Smoking status: Never    Passive exposure: Never   Smokeless tobacco: Never  Vaping Use   Vaping Use: Never used  Substance Use Topics   Alcohol use: Not Currently    Alcohol/week: 1.0 standard drink of alcohol    Types: 1 Glasses of wine per week    Comment: 1 glass of wine per day   Drug use: No   Family History  Problem Relation Age of Onset   Asthma Mother    Coronary artery disease Father    Allergies  Allergen Reactions   Gabapentin Swelling   Cefdinir     REACTION: unspecified   Ciprofloxacin     rash   Clarithromycin     REACTION: weird dreams (on prednisone at the same time but has tolerated this in the past)   Diclofenac Dermatitis    Topical only  Causes burning to site   Miglitol     REACTION: unspecified   Niacin     REACTION: unspecified   Qvar [Beclomethasone] Other (See Comments)    Feels funny Feels funny   Jardiance [Empagliflozin] Other (See Comments)    Yeast infection    Metformin And Related Diarrhea   Current Outpatient Medications on File Prior to Visit  Medication Sig Dispense Refill   ACCU-CHEK SMARTVIEW test strip   3   acetaminophen (TYLENOL) 500 MG tablet Take 500 mg by mouth every 4 (four) hours as needed.     albuterol (VENTOLIN HFA) 108 (90 Base) MCG/ACT inhaler Inhale 2 puffs into the lungs every 6 (six) hours as needed. 18 g 1   ALPRAZolam (XANAX) 0.25 MG tablet TAKE 1 TABLET(0.25 MG) BY MOUTH TWICE DAILY AS NEEDED FOR ANXIETY 60 tablet 0   amLODipine (NORVASC) 5 MG tablet Take 1 tablet (5 mg total) by mouth daily. 90 tablet 1   aspirin 81 MG tablet Take 81 mg by mouth daily.     atorvastatin (LIPITOR) 20 MG tablet TAKE 1 TABLET BY MOUTH EVERY DAY 90 tablet 3   B-D UF III MINI PEN NEEDLES 31G X 5 MM MISC USE ONCE TO TWICE DAILY 200 each 4   B-D ULTRA-FINE 33 LANCETS MISC Use to test blood sugar twice daily dx: E11.9 200 each 3   calcitonin, salmon,  (MIACALCIN/FORTICAL) 200 UNIT/ACT nasal spray USE 1 SPRAY NASALLY(ALTERNATING NOSTRILS) EVERY DAY 3.7 mL 11   cetirizine (ZYRTEC) 5 MG tablet Take 5 mg by  mouth daily.     famotidine (PEPCID AC) 10 MG tablet Take 1 tablet (10 mg total) by mouth 2 (two) times daily. 90 tablet 1   fluticasone (FLONASE) 50 MCG/ACT nasal spray Place 2 sprays into both nostrils daily as needed for allergies or rhinitis.     insulin glargine (LANTUS) 100 UNIT/ML Solostar Pen 32 Units at bedtime.     levothyroxine (SYNTHROID, LEVOTHROID) 100 MCG tablet Take 100 mcg by mouth daily before breakfast.     MAGNESIUM PO Take 1 tablet by mouth in the morning and at bedtime.     meclizine (ANTIVERT) 25 MG tablet Take 1 tablet (25 mg total) by mouth 3 (three) times daily as needed. 90 tablet 0   metoprolol succinate (TOPROL-XL) 50 MG 24 hr tablet TAKE 1 TABLET(50 MG) BY MOUTH DAILY 90 tablet 3   montelukast (SINGULAIR) 10 MG tablet Take 1 tablet (10 mg total) by mouth at bedtime. 90 tablet 3   Multiple Vitamin (MULTIVITAMIN) tablet Take 1 tablet by mouth daily. Centrum Silver     Multiple Vitamins-Minerals (OCUVITE PO) Take 1 tablet by mouth daily.     NOVOLOG FLEXPEN 100 UNIT/ML FlexPen 12 units 1st meal 18 units 2nd meal     Spacer/Aero-Holding Chambers (VALVED HOLDING CHAMBER) DEVI      TURMERIC PO Take 400 mg by mouth daily.     vitamin C (ASCORBIC ACID) 500 MG tablet Take 500 mg by mouth daily.     No current facility-administered medications on file prior to visit.     Review of Systems  Constitutional:  Positive for fatigue. Negative for activity change, appetite change, fever and unexpected weight change.  HENT:  Negative for congestion, ear pain, rhinorrhea, sinus pressure and sore throat.   Eyes:  Negative for pain, redness and visual disturbance.  Respiratory:  Negative for cough, shortness of breath and wheezing.   Cardiovascular:  Negative for chest pain and palpitations.  Gastrointestinal:  Negative for  abdominal pain, blood in stool, constipation and diarrhea.  Endocrine: Negative for polydipsia and polyuria.  Genitourinary:  Positive for frequency. Negative for dysuria, urgency and vaginal discharge.  Musculoskeletal:  Positive for arthralgias and back pain. Negative for myalgias.  Skin:  Negative for pallor and rash.  Allergic/Immunologic: Negative for environmental allergies.  Neurological:  Negative for dizziness, syncope and headaches.  Hematological:  Negative for adenopathy. Does not bruise/bleed easily.  Psychiatric/Behavioral:  Negative for decreased concentration and dysphoric mood. The patient is nervous/anxious.        Objective:   Physical Exam Constitutional:      General: She is not in acute distress.    Appearance: Normal appearance. She is well-developed. She is obese. She is not ill-appearing or diaphoretic.  HENT:     Head: Normocephalic and atraumatic.  Eyes:     Conjunctiva/sclera: Conjunctivae normal.     Pupils: Pupils are equal, round, and reactive to light.  Neck:     Thyroid: No thyromegaly.     Vascular: No carotid bruit or JVD.  Cardiovascular:     Rate and Rhythm: Normal rate and regular rhythm.     Heart sounds: Normal heart sounds.     No gallop.  Pulmonary:     Effort: Pulmonary effort is normal. No respiratory distress.     Breath sounds: Normal breath sounds. No wheezing or rales.  Abdominal:     General: There is no distension or abdominal bruit.     Palpations: Abdomen is soft.  Comments: No suprapubic tenderness or fullness  No cva tenderness   Musculoskeletal:     Cervical back: Normal range of motion and neck supple.     Right lower leg: No edema.     Left lower leg: No edema.     Comments: No CVA or LS tenderness  Lymphadenopathy:     Cervical: No cervical adenopathy.  Skin:    General: Skin is warm and dry.     Coloration: Skin is not pale.     Findings: No rash.  Neurological:     Mental Status: She is alert.      Coordination: Coordination normal.     Deep Tendon Reflexes: Reflexes are normal and symmetric. Reflexes normal.  Psychiatric:        Mood and Affect: Mood normal.     Comments: Very tangential  Difficult to get a history  Anxious            Assessment & Plan:   Problem List Items Addressed This Visit       Genitourinary   Acute cystitis - Primary    Pt cannot exactly explain her symptoms (? Chills/low back pain and unsure if urinary freq is her baseline or not) Pos ua and reassuring exam Is worried about her general kidney function and we reviewed her last labs from June and July  Px macrobid (had pos cephalosporin allergy)  Pending urine culture  She stays hydrated  Disc what to watch for  ER parameters discussed Handout given on utis       Relevant Orders   Urine Culture   Other Visit Diagnoses     Low back pain, unspecified back pain laterality, unspecified chronicity, unspecified whether sciatica present       Relevant Orders   POCT Urinalysis Dipstick (Automated) (Completed)

## 2022-07-31 LAB — URINE CULTURE
MICRO NUMBER:: 13917973
SPECIMEN QUALITY:: ADEQUATE

## 2022-08-01 ENCOUNTER — Encounter: Payer: Self-pay | Admitting: Internal Medicine

## 2022-08-01 ENCOUNTER — Telehealth: Payer: Self-pay | Admitting: Internal Medicine

## 2022-08-01 ENCOUNTER — Ambulatory Visit: Payer: Medicare PPO | Admitting: Internal Medicine

## 2022-08-01 DIAGNOSIS — N3000 Acute cystitis without hematuria: Secondary | ICD-10-CM

## 2022-08-01 MED ORDER — AMOXICILLIN-POT CLAVULANATE 875-125 MG PO TABS: 1.0000 | ORAL_TABLET | Freq: Two times a day (BID) | ORAL | 0 refills | Status: DC

## 2022-08-01 NOTE — Telephone Encounter (Signed)
[facility] - Client TELEPHONE ADVICE RECORD [facility] Patient Name: [name] Gender: Female DOB: 02-02-1931 Age: 88 Y 12 M 2 D Return Phone Number: [phone] (Primary), [phone] (Secondary) Address: City/ State/ Zip: [place]  [address] Client [facility] - Client Client Site [facility] - Day Provider Viviana Simpler- MD Contact Type Call Who Is Calling Patient / Member / Family / Caregiver Call Type Triage / Clinical Caller Name [name] Relationship To Patient Daughter Return Phone Number [phone] (Primary) Chief Complaint Abdominal Pain Reason for Call Symptomatic / Request for Health Information Initial Comment Caller states last Thursday her mom was diagnosed with UTI. Now she has lower back and stomach pain. Translation No Nurse Assessment Nurse: Cherre Robins, RN, Ria Comment Date/Time (Eastern Time): 08/01/2022 12:50:17 PM Confirm and document reason for call. If symptomatic, describe symptoms. ---Caller states her mom was seen in office last week and told they think she has a UTI and sent off a culture- results not back yet- and started on macrobid. States last day of abx is tomorrow morning. States she feels like she has to strain to completely empty her bladder and having very mild lower back. No fever. Has an office appt tomorrow morning at 11am. Does the patient have any new or worsening symptoms? ---Yes Will a triage be completed? ---Yes Related visit to physician within the last 2 weeks? ---Yes Does the PT have any chronic conditions? (i.e. diabetes, asthma, this includes High risk factors for pregnancy, etc.) ---Yes List chronic conditions. ---diabetes, HTN Is this a behavioral health or substance abuse call? ---No Guidelines Guideline Title Affirmed Question Affirmed Notes Nurse Date/Time (Eastern Time) Urinary Tract Infection on Antibiotic  Follow-up Call - Female [1] Taking antibiotic > 24 hours for UTI AND [2] flank or lower back pain getting [facility], RN, Ria Comment 08/01/2022 12:53:47 PM PLEASE NOTE: All timestamps contained within this report are represented as [place] Standard Time. CONFIDENTIALTY NOTICE: This fax transmission is intended only for the addressee. It contains information that is legally privileged, confidential or otherwise protected from use or disclosure. If you are not the intended recipient, you are strictly prohibited from reviewing, disclosing, copying using or disseminating any of this information or taking any action in reliance on or regarding this information. If you have received this fax in error, please notify us immediately by telephone so that we can arrange for its return to [place]. Phone: [phone], Toll-Free: [phone], Fax: [phone] Page: 2 of 2 Call Id: [id number] [place]. Time Eilene Ghazi Time) Disposition Final User 08/01/2022 12:58:54 PM See HCP within 4 Hours (or PCP triage) Yes [facility], RN, Ria Comment Final Disposition 08/01/2022 12:58:54 PM See HCP within 4 Hours (or PCP triage) Yes [facility], RN, Ivonne Andrew Disagree/Comply Comply Caller Understands Yes PreDisposition InappropriateToAsk Care Advice Given Per Guideline SEE HCP (OR PCP TRIAGE) WITHIN 4 HOURS: * IF OFFICE WILL BE OPEN: You need to be seen within the next 3 or 4 hours. Call your doctor (or NP/PA) now or as soon as the office opens. PAIN MEDICINES: * Use the lowest amount of medicine that makes your pain better. * Before taking any medicine, read all the instructions on the package. CALL BACK IF: * You become worse CARE ADVICE given per Urinary Tract Infection on Antibiotic Follow-Up Call, Female (Adult) guideline. Comments User: Carolan Clines, RN Date/Time Eilene Ghazi Time): 08/01/2022 12:53:44 PM type 2 diabetic User: Carolan Clines, RN Date/Time Eilene Ghazi Time): 08/01/2022 1:02:40  PM c/o mild  abd pain last night. Referrals Warm transfer to [facility]lin

## 2022-08-01 NOTE — Progress Notes (Signed)
Subjective:    Patient ID: [name], female    DOB: 05-Sep-1931, 88 y.o.   MRN: [id number]  HPI Here with daughter due to ongoing back pain and urinary symptoms  Started a week ago Felt "crummy"---like she had fever (but she didn't) Had some pain in RLQ and felt lymph nodes in groin that were sore (but they went away) Tried massage on the area---and it went away the next morning Then recurred 2 days ago--gone again  Also pain along the lower back--points in upper lumbar area across the whole back Also some low lumbar pain --seems to be related to twisting or certain movements  Gets some urgency--last week---that persists Did take the macrobid--- 2 more doses for the 5 days This only started last week No dysuria--last week or now No hematuria  Current Outpatient Medications on File Prior to Visit  Medication Sig Dispense Refill   ACCU-CHEK SMARTVIEW test strip   3   acetaminophen (TYLENOL) 500 MG tablet Take 500 mg by mouth every 4 (four) hours as needed.     albuterol (VENTOLIN HFA) 108 (90 Base) MCG/ACT inhaler Inhale 2 puffs into the lungs every 6 (six) hours as needed. 18 g 1   ALPRAZolam (XANAX) 0.25 MG tablet TAKE 1 TABLET(0.25 MG) BY MOUTH TWICE DAILY AS NEEDED FOR ANXIETY 60 tablet 0   amLODipine (NORVASC) 5 MG tablet Take 1 tablet (5 mg total) by mouth daily. 90 tablet 1   aspirin 81 MG tablet Take 81 mg by mouth daily.     atorvastatin (LIPITOR) 20 MG tablet TAKE 1 TABLET BY MOUTH EVERY DAY 90 tablet 3   B-D UF III MINI PEN NEEDLES 31G X 5 MM MISC USE ONCE TO TWICE DAILY 200 each 4   B-D ULTRA-FINE 33 LANCETS MISC Use to test blood sugar twice daily dx: E11.9 200 each 3   calcitonin, salmon, (MIACALCIN/FORTICAL) 200 UNIT/ACT nasal spray USE 1 SPRAY NASALLY(ALTERNATING NOSTRILS) EVERY DAY 3.7 mL 11   cetirizine (ZYRTEC) 5 MG tablet Take 5 mg by mouth daily.     famotidine (PEPCID AC) 10 MG tablet Take 1 tablet (10 mg total) by mouth 2 (two) times daily. 90 tablet 1    fluticasone (FLONASE) 50 MCG/ACT nasal spray Place 2 sprays into both nostrils daily as needed for allergies or rhinitis.     insulin glargine (LANTUS) 100 UNIT/ML Solostar Pen 32 Units at bedtime.     levothyroxine (SYNTHROID, LEVOTHROID) 100 MCG tablet Take 100 mcg by mouth daily before breakfast.     MAGNESIUM PO Take 1 tablet by mouth in the morning and at bedtime.     meclizine (ANTIVERT) 25 MG tablet Take 1 tablet (25 mg total) by mouth 3 (three) times daily as needed. 90 tablet 0   metoprolol succinate (TOPROL-XL) 50 MG 24 hr tablet TAKE 1 TABLET(50 MG) BY MOUTH DAILY 90 tablet 3   montelukast (SINGULAIR) 10 MG tablet Take 1 tablet (10 mg total) by mouth at bedtime. 90 tablet 3   Multiple Vitamin (MULTIVITAMIN) tablet Take 1 tablet by mouth daily. Centrum Silver     Multiple Vitamins-Minerals (OCUVITE PO) Take 1 tablet by mouth daily.     nitrofurantoin, macrocrystal-monohydrate, (MACROBID) 100 MG capsule Take 1 capsule (100 mg total) by mouth 2 (two) times daily. 10 capsule 0   NOVOLOG FLEXPEN 100 UNIT/ML FlexPen 12 units 1st meal 18 units 2nd meal     Spacer/Aero-Holding Chambers (VALVED HOLDING CHAMBER) DEVI      TURMERIC  PO Take 400 mg by mouth daily.     vitamin C (ASCORBIC ACID) 500 MG tablet Take 500 mg by mouth daily.     No current facility-administered medications on file prior to visit.    Allergies  Allergen Reactions   Gabapentin Swelling   Cefdinir     REACTION: unspecified   Ciprofloxacin     rash   Clarithromycin     REACTION: weird dreams (on prednisone at the same time but has tolerated this in the past)   Diclofenac Dermatitis    Topical only  Causes burning to site   Miglitol     REACTION: unspecified   Niacin     REACTION: unspecified   Qvar [Beclomethasone] Other (See Comments)    Feels funny Feels funny   Jardiance [Empagliflozin] Other (See Comments)    Yeast infection    Metformin And Related Diarrhea    Past Medical History:  Diagnosis  Date   Allergy    Asthma    Diabetes mellitus    Diastolic dysfunction    a. 06/2019 Echo: EF 60-65%, no rwma, GR1 DD. Mild MR. Nl RV fxn.   Essential hypertension    GERD (gastroesophageal reflux disease)    Hyperlipidemia    Mixed hyperlipidemia    Osteopenia    PAF (paroxysmal atrial fibrillation) ([place])    a. Dx in late 1990's - no known recurrence.   Sleep disorder    Thyroid disease     Past Surgical History:  Procedure Laterality Date   CERVICAL CONE BIOPSY  1960's   CHOLECYSTECTOMY     NASAL POLYP SURGERY  01/01   STRABISMUS SURGERY  1946 / 1967   VAGINAL DELIVERY     x2    Family History  Problem Relation Age of Onset   Asthma Mother    Coronary artery disease Father     Social History   Socioeconomic History   Marital status: Widowed    Spouse name: Not on file   Number of children: 2   Years of education: Not on file   Highest education level: Not on file  Occupational History    Employer: RETIRED  Tobacco Use   Smoking status: Never    Passive exposure: Never   Smokeless tobacco: Never  Vaping Use   Vaping Use: Never used  Substance and Sexual Activity   Alcohol use: Not Currently    Alcohol/week: 1.0 standard drink of alcohol    Types: 1 Glasses of wine per week    Comment: 1 glass of wine per day   Drug use: No   Sexual activity: Not Currently  Other Topics Concern   Not on file  Social History Narrative   Widowed 3/23      Has living will   Not sure about formal POA --but requests daughter [name] (and son Fara Olden) to be health care POAs   Would accept resuscitation attempts   Not sure about feeding tubes   Social Determinants of Health   Financial Resource Strain: Not on file  Food Insecurity: Not on file  Transportation Needs: Not on file  Physical Activity: Not on file  Stress: Not on file  Social Connections: Not on file  Intimate Partner Violence: Not on file   Review of Systems No N/V Eating okay Moving bowels      Objective:   Physical Exam Constitutional:      Appearance: Normal appearance.  Abdominal:     Palpations: Abdomen is soft.  Tenderness: There is no abdominal tenderness. There is no right CVA tenderness, left CVA tenderness, guarding or rebound.  Musculoskeletal:     Comments: Mild low right back pain Fairly normal ROM at hips SLR negative  Neurological:     Mental Status: She is alert.           Assessment & Plan:

## 2022-08-01 NOTE — Telephone Encounter (Signed)
Per appt notes pt already has appt with Dr Silvio Pate 08/01/22 at [place]. Sending note to DR Silvio Pate and Larene Beach CMA.

## 2022-08-01 NOTE — Progress Notes (Unsigned)
Cardiology Office Note    Date:  08/03/2022   ID:  [name], Hannah 04/24/1931, MRN [id number]  PCP:  [name], MD  Cardiologist:  Julien Nordmann, MD  Electrophysiologist:  None   Chief Complaint: Follow-up  History of Present Illness:   [name] is a 88 y.o. female with history of PAF diagnosed in the late 1990s, diastolic dysfunction, DM2, HTN, HLD, hypothyroidism, and GERD who presents for follow-up of A-fib.   She was initially diagnosed with A-fib in the 1990s.  More recently, she wore a heart monitor in 06/2016 which showed no evidence of A-fib.  Echo in 06/2016 demonstrated an EF of 60 to 65%, no regional wall motion abnormalities, grade 1 diastolic dysfunction, mild mitral regurgitation, normal RV systolic function, and normal PASP.  She has not been maintained on anticoagulation given presumed isolated episode in the late 1990s.  She was seen in our office in 12/2021 noting some shortness of breath and felt like this was due to allergies with symptomatic improvement with albuterol.   She was admitted to the hospital in late 03/2022, with hyponatremia with a presenting sodium of 119.  Treated with DDAVP and D5 bolus.  Urine studies were deferred given she had received fluids and DDAVP.  There was concern her hyponatremia was in the setting of a component of hypovolemic hyponatremia secondary to diarrhea along with some pseudohyponatremia secondary to hyperglycemia.  Sodium had improved to 132 by discharge.  She was seen in hospital follow-up on 05/03/2022, and was without symptoms of angina or decompensation.  She continued to note ongoing diarrhea that had been present for approximately 6 months.  Her weight was down 11 pounds when compared to her prior clinic visit.  Her appetite was somewhat diminished.  She was maintaining sinus rhythm.  She was transitioned from losartan to amlodipine due to high normal potassium.  She comes in accompanied by her daughter today and is  doing well from a cardiac perspective, without symptoms of angina or decompensation.  Overall, she feels significantly improved when compared to her last visit with [place] in June.  No dizziness, presyncope, or syncope.  Her fatigue has improved.  No further diarrhea following the discontinuation of metformin.  Appetite is improving.  Blood sugars have also been improving.  No significant lower extremity swelling.  Both patient and daughter are pleased with her progress.   Labs independently reviewed: 05/2022 - sodium 134 (corrected to 137), potassium 4.8, BUN 20, serum creatinine 0.6, TSH normal 04/2022 - TC 88, TG 164, HDL 48, LDL 7, Hgb 11.3, PLT 501, albumin 3.5, glucose 241, AST/ALT normal, A1c 8.7, magnesium 1.6  Past Medical History:  Diagnosis Date   Allergy    Asthma    Diabetes mellitus    Diastolic dysfunction    a. 06/2019 Echo: EF 60-65%, no rwma, GR1 DD. Mild MR. Nl RV fxn.   Essential hypertension    GERD (gastroesophageal reflux disease)    Hyperlipidemia    Mixed hyperlipidemia    Osteopenia    PAF (paroxysmal atrial fibrillation) (HCC)    a. Dx in late 1990's - no known recurrence.   Sleep disorder    Thyroid disease     Past Surgical History:  Procedure Laterality Date   CARPAL TUNNEL RELEASE Right    CERVICAL CONE BIOPSY  07/16/1959   CHOLECYSTECTOMY     NASAL POLYP SURGERY  11/15/1999   STRABISMUS SURGERY  1946 / 1967   VAGINAL DELIVERY  x2    Current Medications: Current Meds  Medication Sig   ACCU-CHEK SMARTVIEW test strip    acetaminophen (TYLENOL) 500 MG tablet Take 500 mg by mouth every 4 (four) hours as needed.   albuterol (VENTOLIN HFA) 108 (90 Base) MCG/ACT inhaler Inhale 2 puffs into the lungs every 6 (six) hours as needed.   ALOE PO Take 1 capsule by mouth daily.   ALPRAZolam (XANAX) 0.25 MG tablet TAKE 1 TABLET(0.25 MG) BY MOUTH TWICE DAILY AS NEEDED FOR ANXIETY   amLODipine (NORVASC) 5 MG tablet Take 1 tablet (5 mg total) by mouth daily.    amoxicillin-clavulanate (AUGMENTIN) 875-125 MG tablet Take 1 tablet by mouth 2 (two) times daily.   aspirin 81 MG tablet Take 81 mg by mouth daily.   atorvastatin (LIPITOR) 20 MG tablet TAKE 1 TABLET BY MOUTH EVERY DAY   B-D UF III MINI PEN NEEDLES 31G X 5 MM MISC USE ONCE TO TWICE DAILY   B-D ULTRA-FINE 33 LANCETS MISC Use to test blood sugar twice daily dx: E11.9   calcitonin, salmon, (MIACALCIN/FORTICAL) 200 UNIT/ACT nasal spray USE 1 SPRAY NASALLY(ALTERNATING NOSTRILS) EVERY DAY   cetirizine (ZYRTEC) 5 MG tablet Take 5 mg by mouth daily.   COLLAGEN PO Take by mouth daily.   CORAL CALCIUM PO Take by mouth daily.   CRANBERRY PO Take by mouth daily.   CYANOCOBALAMIN PO Take by mouth daily.   famotidine (PEPCID AC) 10 MG tablet Take 1 tablet (10 mg total) by mouth 2 (two) times daily.   fluticasone (FLONASE) 50 MCG/ACT nasal spray Place 2 sprays into both nostrils daily as needed for allergies or rhinitis.   glucosamine-chondroitin 500-400 MG tablet Take 1 tablet by mouth daily.   insulin glargine (LANTUS) 100 UNIT/ML Solostar Pen 32 Units at bedtime.   levothyroxine (SYNTHROID, LEVOTHROID) 100 MCG tablet Take 100 mcg by mouth daily before breakfast.   MAGNESIUM PO Take 1 tablet by mouth in the morning and at bedtime.   meclizine (ANTIVERT) 25 MG tablet Take 1 tablet (25 mg total) by mouth 3 (three) times daily as needed.   metoprolol succinate (TOPROL-XL) 50 MG 24 hr tablet TAKE 1 TABLET(50 MG) BY MOUTH DAILY   montelukast (SINGULAIR) 10 MG tablet Take 1 tablet (10 mg total) by mouth at bedtime.   Multiple Vitamin (MULTIVITAMIN) tablet Take 1 tablet by mouth daily. Centrum Silver   Multiple Vitamins-Minerals (OCUVITE PO) Take 1 tablet by mouth daily.   NOVOLOG FLEXPEN 100 UNIT/ML FlexPen 12 units 1st meal 18 units 2nd meal   Spacer/Aero-Holding Chambers (VALVED HOLDING CHAMBER) DEVI    TURMERIC PO Take 400 mg by mouth daily.   vitamin C (ASCORBIC ACID) 500 MG tablet Take 500 mg by mouth  daily.   VITAMIN D PO Take by mouth daily.    Allergies:   Gabapentin, Cefdinir, Ciprofloxacin, Clarithromycin, Diclofenac, Miglitol, Niacin, Qvar [beclomethasone], Jardiance [empagliflozin], and Metformin and related   Social History   Socioeconomic History   Marital status: Widowed    Spouse name: Not on file   Number of children: 2   Years of education: Not on file   Highest education level: Not on file  Occupational History    Employer: RETIRED  Tobacco Use   Smoking status: Never    Passive exposure: Never   Smokeless tobacco: Never  Vaping Use   Vaping Use: Never used  Substance and Sexual Activity   Alcohol use: Not Currently    Alcohol/week: 1.0 standard drink of alcohol  Types: 1 Glasses of wine per week    Comment: 1 glass of wine-rare   Drug use: No   Sexual activity: Not Currently  Other Topics Concern   Not on file  Social History Narrative   Widowed 3/23      Has living will   Not sure about formal POA --but requests daughter [name] (and son [name]) to be health care POAs   Would accept resuscitation attempts   Not sure about feeding tubes   Social Determinants of Health   Financial Resource Strain: Not on file  Food Insecurity: Not on file  Transportation Needs: Not on file  Physical Activity: Not on file  Stress: Not on file  Social Connections: Not on file     Family History:  The patient's family history includes Asthma in her mother; Coronary artery disease in her father.  ROS:   12-point review of systems is negative unless otherwise noted in the HPI.   EKGs/Labs/Other Studies Reviewed:    Studies reviewed were summarized above. The additional studies were reviewed today:  Cardiac monitor 06/2016: Baseline rhythm was sinus, heart rate of 72 BPM. Heart rate ranged from 49-112 BPM, average of 68 BPM. No atrial fibrillation.   Manually detected events: Symptoms of chest pain, accidental push - sinus rhythm, sinus rhythm with first-degree  AV block __________   2D echo 07/01/2016: - Left ventricle: The cavity size was normal. Systolic function was    normal. The estimated ejection fraction was in the range of 60%    to 65%. Wall motion was normal; there were no regional wall    motion abnormalities. Doppler parameters are consistent with    abnormal left ventricular relaxation (grade 1 diastolic    dysfunction).  - Mitral valve: There was mild regurgitation.  - Left atrium: The atrium was normal in size.  - Right ventricle: Systolic function was normal.  - Pulmonary arteries: Systolic pressure was within the normal    range.   Impressions:   - Rhythm is normal sinus.   EKG:  EKG is ordered today.  The EKG ordered today demonstrates NSR, 69 bpm, low voltage QRS, possible prior anterior infarct versus lead placement, no acute ST-T changes, consistent with prior tracings  Recent Labs: 05/02/2022: ALT 13; Hemoglobin 11.3; Platelets 501.0 06/02/2022: BUN 20; Creatinine, Ser 0.60; Potassium 4.8; Sodium 134; TSH 2.77  Recent Lipid Panel    Component Value Date/Time   CHOL 88 05/02/2022 1402   CHOL 100 06/23/2016 1236   TRIG 164.0 (H) 05/02/2022 1402   HDL 48.20 05/02/2022 1402   HDL 44 06/23/2016 1236   CHOLHDL 2 05/02/2022 1402   VLDL 32.8 05/02/2022 1402   LDLCALC 7 05/02/2022 1402   LDLCALC 18 06/23/2016 1236   LDLDIRECT 45.0 09/09/2021 1646    PHYSICAL EXAM:    VS:  BP 120/64 (BP Location: Left Arm, Patient Position: Sitting, Cuff Size: Large)   Pulse 69   Ht 5\' 3"  (1.6 m)   Wt 176 lb (79.8 kg)   SpO2 96%   BMI 31.18 kg/m   BMI: Body mass index is 31.18 kg/m.  Physical Exam Vitals reviewed.  Constitutional:      Appearance: She is well-developed.  HENT:     Head: Normocephalic and atraumatic.  Eyes:     General:        Right eye: No discharge.        Left eye: No discharge.  Neck:     Vascular: No JVD.  Cardiovascular:     Rate and Rhythm: Normal rate and regular rhythm.     Pulses:           Posterior tibial pulses are 2+ on the right side and 2+ on the left side.     Heart sounds: Normal heart sounds, S1 normal and S2 normal. Heart sounds not distant. No midsystolic click and no opening snap. No murmur heard.    No friction rub.  Pulmonary:     Effort: Pulmonary effort is normal. No respiratory distress.     Breath sounds: Normal breath sounds. No decreased breath sounds, wheezing or rales.  Chest:     Chest wall: No tenderness.  Abdominal:     General: There is no distension.  Musculoskeletal:     Cervical back: Normal range of motion.     Right lower leg: No edema.     Left lower leg: No edema.  Skin:    General: Skin is warm and dry.     Nails: There is no clubbing.  Neurological:     Mental Status: She is alert and oriented to person, place, and time.  Psychiatric:        Speech: Speech normal.        Behavior: Behavior normal.        Thought Content: Thought content normal.        Judgment: Judgment normal.     Wt Readings from Last 3 Encounters:  08/03/22 176 lb (79.8 kg)  08/01/22 174 lb (78.9 kg)  07/28/22 175 lb 4 oz (79.5 kg)     ASSESSMENT & PLAN:   PAF: Maintain sinus rhythm.  Appears to have been an isolated episode in the 1990s, and has not been maintained on anticoagulation.  She remains on metoprolol.  Should she have recurrence of A-fib, OAC would need to be revisited.  HTN: Blood pressure is well controlled in the office today.  She remains on amlodipine and metoprolol.  They will keep a track on her blood pressures over the next several weeks and update us.  HLD: LDL 7 in 04/2022.  She remains on atorvastatin.  Hyponatremia: Resolved on most recent check with corrected sodium of 137.    Disposition: F/u with Dr. Mariah MillingGollan or an APP in 6 months.   Medication Adjustments/Labs and Tests Ordered: Current medicines are reviewed at length with the patient today.  Concerns regarding medicines are outlined above. Medication changes, Labs and Tests  ordered today are summarized above and listed in the Patient Instructions accessible in Encounters.   Signed, Eula Listenyan Carena Stream, PA-C 08/03/2022 4:00 PM     [facility] [address] [place], [place] [address] [phone]

## 2022-08-01 NOTE — Assessment & Plan Note (Addendum)
Still has the urgency Back pain seems more muscular--nothing to suggest kidney pain 4 days of macrobid--it should have worked---but will switch to augmentin (3 days may be enough) Reassured about the abdominal pain---no sig tenderness (and spot was above the appendix)  Also reassured--no lymphadenopathy---must have been something else as they don't go away that fast

## 2022-08-01 NOTE — Telephone Encounter (Signed)
Yes I will see her today.

## 2022-08-01 NOTE — Telephone Encounter (Signed)
Access Nurse update: Needs to be seen in 2-3 hours  Scheduled for 4pm with Dr. Silvio Pate 08/01/22

## 2022-08-01 NOTE — Telephone Encounter (Signed)
[name] called in stating that [name] symptoms haven't improved. She stated that she is concerned about her kidneys. Stated that her abdomen and lower back are tender to the touch and is sensitive when pushing on it. She is scheduled with Dr. Silvio Pate tomorrow. Sent over to access nurse.

## 2022-08-02 ENCOUNTER — Ambulatory Visit: Payer: Medicare PPO | Admitting: Internal Medicine

## 2022-08-03 ENCOUNTER — Ambulatory Visit: Payer: Medicare PPO | Attending: Physician Assistant | Admitting: Physician Assistant

## 2022-08-03 ENCOUNTER — Encounter: Payer: Self-pay | Admitting: Physician Assistant

## 2022-08-03 VITALS — BP 120/64 | HR 69 | Ht 63.0 in | Wt 176.0 lb

## 2022-08-03 DIAGNOSIS — I480 Paroxysmal atrial fibrillation: Secondary | ICD-10-CM | POA: Diagnosis not present

## 2022-08-03 DIAGNOSIS — E782 Mixed hyperlipidemia: Secondary | ICD-10-CM

## 2022-08-03 DIAGNOSIS — E871 Hypo-osmolality and hyponatremia: Secondary | ICD-10-CM | POA: Diagnosis not present

## 2022-08-03 DIAGNOSIS — I10 Essential (primary) hypertension: Secondary | ICD-10-CM | POA: Diagnosis not present

## 2022-08-03 NOTE — Patient Instructions (Signed)
Medication Instructions:  Your physician recommends that you continue on your current medications as directed. Please refer to the Current Medication list given to you today.  *If you need a refill on your cardiac medications before your next appointment, please call your pharmacy*   Lab Work: None ordered  If you have labs (blood work) drawn today and your tests are completely normal, you will receive your results only by: MyChart Message (if you have MyChart) OR A paper copy in the mail If you have any lab test that is abnormal or we need to change your treatment, we will call you to review the results.   Testing/Procedures: None ordered  Follow-Up: At [facility], you and your health needs are our priority.  As part of our continuing mission to provide you with exceptional heart care, we have created designated Provider Care Teams.  These Care Teams include your primary Cardiologist (physician) and Advanced Practice Providers (APPs -  Physician Assistants and Nurse Practitioners) who all work together to provide you with the care you need, when you need it.  We recommend signing up for the patient portal called "MyChart".  Sign up information is provided on this After Visit Summary.  MyChart is used to connect with patients for Virtual Visits (Telemedicine).  Patients are able to view lab/test results, encounter notes, upcoming appointments, etc.  Non-urgent messages can be sent to your provider as well.   To learn more about what you can do with MyChart, go to [email or web address].    Your next appointment:   6 month(s)  The format for your next appointment:   In Person  Provider:   You may see Timothy Gollan, MD or one of the following Advanced Practice Providers on your designated Care Team:   Christopher Berge, NP Ryan Dunn, PA-C Cadence Furth, PA-C Sheri Hammock, NP   Important Information About Sugar       

## 2022-08-04 ENCOUNTER — Ambulatory Visit (INDEPENDENT_AMBULATORY_CARE_PROVIDER_SITE_OTHER): Payer: Medicare PPO | Admitting: Psychologist

## 2022-08-04 ENCOUNTER — Telehealth: Payer: Self-pay | Admitting: Internal Medicine

## 2022-08-04 DIAGNOSIS — F320 Major depressive disorder, single episode, mild: Secondary | ICD-10-CM

## 2022-08-04 DIAGNOSIS — Z634 Disappearance and death of family member: Secondary | ICD-10-CM

## 2022-08-04 NOTE — Telephone Encounter (Signed)
Noted We should just keep up with the rest of her schedule

## 2022-08-04 NOTE — Progress Notes (Signed)
[facility] Counselor/Therapist Progress Note  Patient ID: [name], MRN: [id number],    Date: 08/04/2022  Time Spent: 03:00 pm to 03:40 pm; total time: 40 minutes   This session was held via in person. The patient consented to in-person therapy and was in the clinician's office. Limits of confidentiality were discussed with the patient.   Treatment Type: Individual Therapy  Reported Symptoms: Grief  Mental Status Exam: Appearance:  Well Groomed     Behavior: Appropriate  Motor: Normal  Speech/Language:  Clear and Coherent  Affect: Appropriate  Mood: normal  Thought process: normal  Thought content:   WNL  Sensory/Perceptual disturbances:   WNL  Orientation: oriented to person, place, and time/date  Attention: Good  Concentration: Good  Memory: WNL Immediate;   Good  Fund of knowledge:  Good  Insight:   Fair  Judgment:  Good  Impulse Control: Good   Risk Assessment: Danger to Self:  No Self-injurious Behavior: No Danger to Others: No Duty to Warn:no Physical Aggression / Violence:No  Access to Firearms a concern: No  Gang Involvement:No   Subjective: Beginning the session, patient described herself as doing well. After reviewing the treatment plan, patient participated in a life review of herself and her husband's life. She processed thoughts and emotions. She asked to follow up. She denied suicidal and homicidal ideation.    Interventions:  Worked on developing a therapeutic relationship with the patient using active listening and reflective statements. Provided emotional support using empathy and validation. Reviewed the treatment plan with the patient. Reviewed events since the intake. Normalized and validated thoughts. Identified goals for the session. Assisted the patient in doing a life review. Assessed for suicidal and homicidal ideation.   Homework: [employer] of husband  Next Session: Review homework. Process how to honor  husband  Diagnosis: F32.0 major depressive affective disorder, single episode, mild and K74.2 uncomplicated bereavement.   Plan:   Goals Begin a healthy grieving process Objectives target date for all objectives is 06/22/2023 Tell in detail the story of the current loss that is triggering symptoms Read books on the topic of grief Watch videos on the theme of grief Begin verbalizing feelings associated with the loss Attend a grief support group express thoughts and feelings about the deceased Identify and voice positives about the deceased implement acts of spiritual faith  Interventions create a safe environment and actively build trust use empathy, compassion, and support ask the patient to write a letter to the lost person conduct empty chair ask the patient to discuss and list the positives and negative aspects of the person encourage patient to rely upon his/her spiritual faith  ask client to read books on grief ask patient to watch videos about grief assist patient in identifying emotions  ask patient to attend support group   The patient and clinician reviewed the treatment plan on 08/04/2022. The patient approved of the treatment plan.   Conception Chancy, PsyD

## 2022-08-04 NOTE — Telephone Encounter (Signed)
Melissa from [facility] called over and stated that patient missed OT visit. She stated she had other doctor appointments and suffering from UTI.

## 2022-08-04 NOTE — Progress Notes (Signed)
                Hannah Morrisette, PsyD 

## 2022-08-05 ENCOUNTER — Other Ambulatory Visit: Payer: Self-pay | Admitting: Internal Medicine

## 2022-08-08 NOTE — Telephone Encounter (Signed)
Last filled 06-28-22 #60 Last OV 08-01-22 Acute Next OV 09-15-22 [facility] #[id number] - [place], [place]

## 2022-08-09 DIAGNOSIS — E113293 Type 2 diabetes mellitus with mild nonproliferative diabetic retinopathy without macular edema, bilateral: Secondary | ICD-10-CM | POA: Diagnosis not present

## 2022-08-09 DIAGNOSIS — E1169 Type 2 diabetes mellitus with other specified complication: Secondary | ICD-10-CM | POA: Diagnosis not present

## 2022-08-09 DIAGNOSIS — E1165 Type 2 diabetes mellitus with hyperglycemia: Secondary | ICD-10-CM | POA: Diagnosis not present

## 2022-08-09 DIAGNOSIS — Z794 Long term (current) use of insulin: Secondary | ICD-10-CM | POA: Diagnosis not present

## 2022-08-09 DIAGNOSIS — E669 Obesity, unspecified: Secondary | ICD-10-CM | POA: Diagnosis not present

## 2022-08-29 ENCOUNTER — Ambulatory Visit (INDEPENDENT_AMBULATORY_CARE_PROVIDER_SITE_OTHER): Payer: Medicare PPO | Admitting: Psychologist

## 2022-08-29 DIAGNOSIS — Z634 Disappearance and death of family member: Secondary | ICD-10-CM | POA: Diagnosis not present

## 2022-08-29 DIAGNOSIS — F320 Major depressive disorder, single episode, mild: Secondary | ICD-10-CM | POA: Diagnosis not present

## 2022-08-29 NOTE — Progress Notes (Signed)
[facility] Counselor/Therapist Progress Note  Patient ID: [name], MRN: [id number],    Date: 08/29/2022  Time Spent: 01:07 pm to 01:49 pm; total time: 42 minutes   This session was held via in person. The patient consented to in-person therapy and was in the clinician's office. Limits of confidentiality were discussed with the patient.   Treatment Type: Individual Therapy  Reported Symptoms: Grief  Mental Status Exam: Appearance:  Well Groomed     Behavior: Appropriate  Motor: Normal  Speech/Language:  Clear and Coherent  Affect: Appropriate  Mood: normal  Thought process: normal  Thought content:   WNL  Sensory/Perceptual disturbances:   WNL  Orientation: oriented to person, place, and time/date  Attention: Good  Concentration: Good  Memory: WNL Immediate;   Good  Fund of knowledge:  Good  Insight:   Fair  Judgment:  Good  Impulse Control: Good   Risk Assessment: Danger to Self:  No Self-injurious Behavior: No Danger to Others: No Duty to Warn:no Physical Aggression / Violence:No  Access to Firearms a concern: No  Gang Involvement:No   Subjective: Beginning the session, patient described herself as doing well while talking about events since the last session. She spent time processing thoughts and emotions related to pictures that she brought in from her family of origin. She talked about wanting to be accepted by her siblings. She processed thoughts and emotions. She asked to follow up. She denied suicidal and homicidal ideation.    Interventions:  Worked on developing a therapeutic relationship with the patient using active listening and reflective statements. Provided emotional support using empathy and validation. Reviewed the treatment plan with the patient. Praised the patient for doing well and explored what has assisted the patient. Reviewed events since the last session. Praised patient for completing homework. Reflected on the pictures  that the patient brought in. Used socratic questions and reflective statements. Processed thoughts and emotions. Provided empathic statements. Processed different themes. Assessed for suicidal and homicidal ideation.   Homework: Reflect on how to honor husband. Bring pictures  Next Session: Review homework.   Diagnosis: F32.0 major depressive affective disorder, single episode, mild and P53.6 uncomplicated bereavement.   Plan:   Goals Begin a healthy grieving process Objectives target date for all objectives is 06/22/2023 Tell in detail the story of the current loss that is triggering symptoms Read books on the topic of grief Watch videos on the theme of grief Begin verbalizing feelings associated with the loss Attend a grief support group express thoughts and feelings about the deceased Identify and voice positives about the deceased implement acts of spiritual faith  Interventions create a safe environment and actively build trust use empathy, compassion, and support ask the patient to write a letter to the lost person conduct empty chair ask the patient to discuss and list the positives and negative aspects of the person encourage patient to rely upon his/her spiritual faith  ask client to read books on grief ask patient to watch videos about grief assist patient in identifying emotions  ask patient to attend support group   The patient and clinician reviewed the treatment plan on 08/04/2022. The patient approved of the treatment plan.   Conception Chancy, PsyD

## 2022-09-12 ENCOUNTER — Telehealth: Payer: Self-pay | Admitting: Cardiovascular Disease

## 2022-09-12 ENCOUNTER — Other Ambulatory Visit: Payer: Self-pay | Admitting: Internal Medicine

## 2022-09-12 NOTE — Telephone Encounter (Signed)
Last filled 08-08-22 #60 Last OV 08-01-22 Acute Next OV 09-15-22 [facility] #[id number] - [place], [place]

## 2022-09-12 NOTE — Telephone Encounter (Signed)
Bp readings are all 2-3 hours after her BP medication. Please advise.  Pt takes Toprol 50 mg daily and 5 mg amlodipine,

## 2022-09-12 NOTE — Telephone Encounter (Signed)
Pt c/o BP issue: STAT if pt c/o blurred vision, one-sided weakness or slurred speech  1. What are your last 5 BP readings?  10/22 - 147/82 10/23 - 129/83 10/24 - 134/92 10/25 - 140/95 10/26 - 135/86 10/27 - 136/92 10/28 - 154/100 // 2 mins after 152/91 10/29 - 151/90 // 2 mins after 161/109  2. Are you having any other symptoms (ex. Dizziness, headache, blurred vision, passed out)? No  3. What is your BP issue? Pt's daughter states that pt's bp seems to have went up the last two days and she is unsure as to why. Requesting call back to see if there is something different she should be doing. Please advise.

## 2022-09-13 MED ORDER — AMLODIPINE BESYLATE 5 MG PO TABS: 5.0000 mg | ORAL_TABLET | ORAL | 1 refills | Status: DC

## 2022-09-13 NOTE — Telephone Encounter (Signed)
Spoke with patients daughter per release form. Reviewed recommendations by provider, requested that they continue monitoring her readings, and to give [place] a call if they should have any further concerns. She verbalized understanding with no further questions at this time.    Minna Merritts, MD  Sent: Mon September 12, 2022  6:17 PM  To: [facility] T[facility]   Message  Unclear why blood pressure is running high  Would increase amlodipine up to 10 mg daily if systolic pressure remains above 130  If it drops back down could go back to amlodipine 5 with metoprolol  Thx  TGollan

## 2022-09-13 NOTE — Addendum Note (Signed)
Addended by: Valora Corporal on: 09/13/2022 09:31 AM   Modules accepted: Orders

## 2022-09-14 ENCOUNTER — Telehealth: Payer: Self-pay

## 2022-09-14 NOTE — Telephone Encounter (Signed)
error 

## 2022-09-15 ENCOUNTER — Ambulatory Visit: Payer: Medicare PPO | Admitting: Internal Medicine

## 2022-09-15 ENCOUNTER — Ambulatory Visit (INDEPENDENT_AMBULATORY_CARE_PROVIDER_SITE_OTHER): Payer: Medicare PPO | Admitting: Internal Medicine

## 2022-09-15 ENCOUNTER — Encounter: Payer: Self-pay | Admitting: Internal Medicine

## 2022-09-15 VITALS — BP 124/60 | HR 77 | Temp 97.3°F | Ht 61.0 in | Wt 182.0 lb

## 2022-09-15 DIAGNOSIS — E113293 Type 2 diabetes mellitus with mild nonproliferative diabetic retinopathy without macular edema, bilateral: Secondary | ICD-10-CM | POA: Diagnosis not present

## 2022-09-15 DIAGNOSIS — Z794 Long term (current) use of insulin: Secondary | ICD-10-CM

## 2022-09-15 DIAGNOSIS — F39 Unspecified mood [affective] disorder: Secondary | ICD-10-CM | POA: Diagnosis not present

## 2022-09-15 DIAGNOSIS — I10 Essential (primary) hypertension: Secondary | ICD-10-CM | POA: Diagnosis not present

## 2022-09-15 DIAGNOSIS — Z0000 Encounter for general adult medical examination without abnormal findings: Secondary | ICD-10-CM

## 2022-09-15 DIAGNOSIS — E083293 Diabetes mellitus due to underlying condition with mild nonproliferative diabetic retinopathy without macular edema, bilateral: Secondary | ICD-10-CM | POA: Diagnosis not present

## 2022-09-15 DIAGNOSIS — I480 Paroxysmal atrial fibrillation: Secondary | ICD-10-CM | POA: Diagnosis not present

## 2022-09-15 LAB — HM DIABETES FOOT EXAM

## 2022-09-15 NOTE — Assessment & Plan Note (Signed)
I have personally reviewed the Medicare Annual Wellness questionnaire and have noted 1. The patient's medical and social history 2. Their use of alcohol, tobacco or illicit drugs 3. Their current medications and supplements 4. The patient's functional ability including ADL's, fall risks, home safety risks and hearing or visual             impairment. 5. Diet and physical activities 6. Evidence for depression or mood disorders  The patients weight, height, BMI and visual acuity have been recorded in the chart I have made referrals, counseling and provided education to the patient based review of the above and I have provided the pt with a written personalized care plan for preventive services.  I have provided you with a copy of your personalized plan for preventive services. Please take the time to review along with your updated medication list.  No cancer screening due to age Flu vaccine today Prefers no COVID vaccine Consider shingrix at pharmacy Discussed leg strengthening

## 2022-09-15 NOTE — Assessment & Plan Note (Signed)
BP Readings from Last 3 Encounters:  09/15/22 124/60  08/03/22 120/64  08/01/22 118/70   Highly variable Would not increase amlodipine due to fall risk

## 2022-09-15 NOTE — Progress Notes (Signed)
Hearing Screening - Comments:: Has hearing aids. Wearing them today. Vision Screening - Comments:: January 2023  

## 2022-09-15 NOTE — Assessment & Plan Note (Signed)
Suboptimal control with A1c 8.5%--but has improved Given age, don't want to be aggressive On lantus 32, novolog 16/22

## 2022-09-15 NOTE — Progress Notes (Signed)
Subjective:    Patient ID: [name], female    DOB: 1930/12/15, 88 y.o.   MRN: [id number]  HPI Here with daughter for Medicare wellness visit and follow up of chronic health conditions Reviewed advanced directives Reviewed other doctors---Dr Porfilio---ophtho, Dr Gollan--cardiology, Dr Schooler--psychologist, Dr Potter--neurologist, Dr Solum---endocrinologist, Dr Dorise Hiss, DRs Menz/Gaines/Kubinski--ortho, Dr Filiberto Pinks, [facility], Dr Barbera Setters Hospitalized once in May for hyponatremia. Brief rehab Right CTS surgery in office---holding off on the left Daughter lives with her. Daughter usually goes shopping. Helps with housework--but daughter does the laundry and heavier cleaning. Now back to being independent with ADLs (since CTS)--daughter stands by for shower and helps with hair Transfers on her own. Takes the rollator if she is walking longer distances Tries to walk some Vision okay Hearing aides help Has occasional scotch No tobacco No falls Ongoing mood issues--but getting out more Memory is okay  Wonders about water retention Does weigh regularly--gained 6# recently BP has been up some --129/83 on wrist, 146/77 with new arm cuff----before medication Was told to double amlodipine by cardiology Has had some systolic BP up to 160 No chest pain No syncope. No sig dizziness--though notes brief "ear problems" No sig edema--certainly not in AM No headaches No SOB  Still has depression  Grieving Cries easily Daughter feels that she is improving Is still seeing psychologist  Sugars had been high Recent increases in rapid acting insulin has helped Mild change in sensation in feet--like something in her shoe  Current Outpatient Medications on File Prior to Visit  Medication Sig Dispense Refill   ACCU-CHEK SMARTVIEW test strip   3   acetaminophen (TYLENOL) 500 MG tablet Take 500 mg by mouth every 4 (four) hours as needed.     albuterol  (VENTOLIN HFA) 108 (90 Base) MCG/ACT inhaler Inhale 2 puffs into the lungs every 6 (six) hours as needed. 18 g 1   ALOE PO Take 1 capsule by mouth daily.     ALPRAZolam (XANAX) 0.25 MG tablet TAKE 1 TABLET(0.25 MG) BY MOUTH TWICE DAILY AS NEEDED FOR ANXIETY 60 tablet 0   amLODipine (NORVASC) 5 MG tablet Take 1-2 tablets (5-10 mg total) by mouth as directed. Take 2 tablets once daily if systolic blood pressure (top blood pressure reading) remains over 130. If it drops back down then go back to 5 mg once daily. 90 tablet 1   aspirin 81 MG tablet Take 81 mg by mouth daily.     atorvastatin (LIPITOR) 20 MG tablet TAKE 1 TABLET BY MOUTH EVERY DAY 90 tablet 3   B-D UF III MINI PEN NEEDLES 31G X 5 MM MISC USE ONCE TO TWICE DAILY 200 each 4   B-D ULTRA-FINE 33 LANCETS MISC Use to test blood sugar twice daily dx: E11.9 200 each 3   calcitonin, salmon, (MIACALCIN/FORTICAL) 200 UNIT/ACT nasal spray USE 1 SPRAY NASALLY(ALTERNATING NOSTRILS) EVERY DAY 3.7 mL 11   cetirizine (ZYRTEC) 5 MG tablet Take 5 mg by mouth daily.     COLLAGEN PO Take by mouth daily.     CORAL CALCIUM PO Take by mouth daily.     CRANBERRY PO Take by mouth daily.     CYANOCOBALAMIN PO Take by mouth daily.     famotidine (PEPCID AC) 10 MG tablet Take 1 tablet (10 mg total) by mouth 2 (two) times daily. 90 tablet 1   glucosamine-chondroitin 500-400 MG tablet Take 1 tablet by mouth daily.     insulin glargine (LANTUS) 100 UNIT/ML Solostar Pen  32 Units at bedtime.     levothyroxine (SYNTHROID, LEVOTHROID) 100 MCG tablet Take 100 mcg by mouth daily before breakfast.     MAGNESIUM PO Take 1 tablet by mouth in the morning and at bedtime.     meclizine (ANTIVERT) 25 MG tablet Take 1 tablet (25 mg total) by mouth 3 (three) times daily as needed. 90 tablet 0   metoprolol succinate (TOPROL-XL) 50 MG 24 hr tablet TAKE 1 TABLET(50 MG) BY MOUTH DAILY 90 tablet 3   montelukast (SINGULAIR) 10 MG tablet Take 1 tablet (10 mg total) by mouth at bedtime.  90 tablet 3   Multiple Vitamin (MULTIVITAMIN) tablet Take 1 tablet by mouth daily. Centrum Silver     Multiple Vitamins-Minerals (OCUVITE PO) Take 1 tablet by mouth daily.     NOVOLOG FLEXPEN 100 UNIT/ML FlexPen 16 units 1st meal 22 units 2nd meal     Spacer/Aero-Holding Chambers (VALVED HOLDING CHAMBER) DEVI      TURMERIC PO Take 400 mg by mouth daily.     vitamin C (ASCORBIC ACID) 500 MG tablet Take 500 mg by mouth daily.     VITAMIN D PO Take by mouth daily.     No current facility-administered medications on file prior to visit.    Allergies  Allergen Reactions   Gabapentin Swelling   Cefdinir     REACTION: unspecified   Ciprofloxacin     rash   Clarithromycin     REACTION: weird dreams (on prednisone at the same time but has tolerated this in the past)   Diclofenac Dermatitis    Topical only  Causes burning to site   Miglitol     REACTION: unspecified   Niacin     REACTION: unspecified   Qvar [Beclomethasone] Other (See Comments)    Feels funny Feels funny   Jardiance [Empagliflozin] Other (See Comments)    Yeast infection    Metformin And Related Diarrhea    Past Medical History:  Diagnosis Date   Allergy    Asthma    Diabetes mellitus    Diastolic dysfunction    a. 06/2019 Echo: EF 60-65%, no rwma, GR1 DD. Mild MR. Nl RV fxn.   Essential hypertension    GERD (gastroesophageal reflux disease)    Hyperlipidemia    Mixed hyperlipidemia    Osteopenia    PAF (paroxysmal atrial fibrillation) ([place])    a. Dx in late 1990's - no known recurrence.   Sleep disorder    Thyroid disease     Past Surgical History:  Procedure Laterality Date   CARPAL TUNNEL RELEASE Right    CERVICAL CONE BIOPSY  07/16/1959   CHOLECYSTECTOMY     NASAL POLYP SURGERY  11/15/1999   STRABISMUS SURGERY  1946 / 1967   VAGINAL DELIVERY     x2    Family History  Problem Relation Age of Onset   Asthma Mother    Coronary artery disease Father     Social History   Socioeconomic  History   Marital status: Widowed    Spouse name: Not on file   Number of children: 2   Years of education: Not on file   Highest education level: Not on file  Occupational History    Employer: RETIRED  Tobacco Use   Smoking status: Never    Passive exposure: Never   Smokeless tobacco: Never  Vaping Use   Vaping Use: Never used  Substance and Sexual Activity   Alcohol use: Not Currently    Alcohol/week:  1.0 standard drink of alcohol    Types: 1 Glasses of wine per week    Comment: 1 glass of wine-rare   Drug use: No   Sexual activity: Not Currently  Other Topics Concern   Not on file  Social History Narrative   Widowed 3/23      Has living will   Not sure about formal POA --but requests daughter [name] (and son [name]) to be health care POAs   Would accept resuscitation attempts   no feeding tubes if cognitively unaware   Social Determinants of Health   Financial Resource Strain: Not on file  Food Insecurity: Not on file  Transportation Needs: Not on file  Physical Activity: Not on file  Stress: Not on file  Social Connections: Not on file  Intimate Partner Violence: Not on file   Review of Systems Appetite is okay Sleeps okay with the xanax--1/2 usually Wears seat belt Teeth okay---keeps up with dentist Sees derm--but wants me to check behind her ears Some heartburn--takes the pepcid AC at night and increased to bid. No dysphagia Bowels move fine---no blood Voids okay---depends for urge incontinence     Objective:   Physical Exam Constitutional:      Appearance: Normal appearance.  HENT:     Mouth/Throat:     Comments: No lesions Eyes:     Conjunctiva/sclera: Conjunctivae normal.     Comments: Right pupil slightly bigger  Cardiovascular:     Rate and Rhythm: Normal rate and regular rhythm.     Pulses: Normal pulses.     Heart sounds: No murmur heard.    No gallop.  Pulmonary:     Effort: Pulmonary effort is normal.     Breath sounds: Normal breath  sounds. No wheezing or rales.  Abdominal:     Palpations: Abdomen is soft.     Tenderness: There is no abdominal tenderness.  Musculoskeletal:     Cervical back: Neck supple.     Right lower leg: No edema.     Left lower leg: No edema.  Lymphadenopathy:     Cervical: No cervical adenopathy.  Skin:    Findings: No rash.     Comments: No foot lesions  Neurological:     General: No focal deficit present.     Mental Status: She is alert and oriented to person, place, and time.     Comments: Mini-cog normal Mild decreased sensation in feet  Psychiatric:        Mood and Affect: Mood normal.        Behavior: Behavior normal.            Assessment & Plan:

## 2022-09-15 NOTE — Assessment & Plan Note (Signed)
Grieving and anxiety Xanax for sleep Seeing psychiatrist

## 2022-09-15 NOTE — Assessment & Plan Note (Signed)
No symptoms of recurrence on metoprolol 50 daily ASA only

## 2022-09-16 ENCOUNTER — Ambulatory Visit: Payer: Medicare PPO

## 2022-09-19 ENCOUNTER — Telehealth: Payer: Self-pay | Admitting: Internal Medicine

## 2022-09-19 ENCOUNTER — Ambulatory Visit: Payer: Medicare PPO | Admitting: Psychologist

## 2022-09-19 NOTE — Telephone Encounter (Signed)
Spoke to [place]. She said pt does not have a cough. Just a runny nose. Said she advised the person taking the call that all she had was a runny nose. Said Zyrtec is not helping. I advised Coricidin HBP or Flonase. She has some Flonase and will try that.

## 2022-09-19 NOTE — Telephone Encounter (Signed)
Patient called and asked for a good over the counter for a cold for a patient. Call back number [phone].

## 2022-09-25 ENCOUNTER — Other Ambulatory Visit: Payer: Self-pay | Admitting: Internal Medicine

## 2022-09-26 DIAGNOSIS — H6123 Impacted cerumen, bilateral: Secondary | ICD-10-CM | POA: Diagnosis not present

## 2022-09-26 DIAGNOSIS — J300 Vasomotor rhinitis: Secondary | ICD-10-CM | POA: Diagnosis not present

## 2022-09-26 DIAGNOSIS — H903 Sensorineural hearing loss, bilateral: Secondary | ICD-10-CM | POA: Diagnosis not present

## 2022-09-28 ENCOUNTER — Ambulatory Visit (INDEPENDENT_AMBULATORY_CARE_PROVIDER_SITE_OTHER): Payer: Medicare PPO | Admitting: Internal Medicine

## 2022-09-28 ENCOUNTER — Encounter: Payer: Self-pay | Admitting: Internal Medicine

## 2022-09-28 VITALS — BP 124/70 | HR 76 | Temp 96.8°F | Ht 61.0 in | Wt 189.0 lb

## 2022-09-28 DIAGNOSIS — J0110 Acute frontal sinusitis, unspecified: Secondary | ICD-10-CM | POA: Diagnosis not present

## 2022-09-28 MED ORDER — AMOXICILLIN-POT CLAVULANATE 875-125 MG PO TABS: 1.0000 | ORAL_TABLET | Freq: Two times a day (BID) | ORAL | 0 refills | Status: DC

## 2022-09-28 NOTE — Assessment & Plan Note (Signed)
Worsening after 8-9 days Chronic sinus symptoms also Will treat with augmentin for 7 days

## 2022-09-28 NOTE — Progress Notes (Signed)
Subjective:    Patient ID: [name], female    DOB: 24-Jul-1931, 88 y.o.   MRN: [id number]  HPI Here with her daughter due to a respiratory infection  Started with symptoms a little over a week ago Thought it was allergies at first---but didn't improve with her meds Has bad nasal congestion--yellow discharge No fever---tylenol regularly Some cough--from the drainage No headache No ear pain Seems to be going into chest now---bronchial Has been sleeping more  Current Outpatient Medications on File Prior to Visit  Medication Sig Dispense Refill   ACCU-CHEK SMARTVIEW test strip   3   acetaminophen (TYLENOL) 500 MG tablet Take 500 mg by mouth every 4 (four) hours as needed.     albuterol (VENTOLIN HFA) 108 (90 Base) MCG/ACT inhaler Inhale 2 puffs into the lungs every 6 (six) hours as needed. 18 g 1   ALOE PO Take 1 capsule by mouth daily.     ALPRAZolam (XANAX) 0.25 MG tablet TAKE 1 TABLET(0.25 MG) BY MOUTH TWICE DAILY AS NEEDED FOR ANXIETY 60 tablet 0   amLODipine (NORVASC) 5 MG tablet Take 1-2 tablets (5-10 mg total) by mouth as directed. Take 2 tablets once daily if systolic blood pressure (top blood pressure reading) remains over 130. If it drops back down then go back to 5 mg once daily. 90 tablet 1   aspirin 81 MG tablet Take 81 mg by mouth daily.     atorvastatin (LIPITOR) 20 MG tablet TAKE 1 TABLET BY MOUTH EVERY DAY 90 tablet 3   B-D ULTRA-FINE 33 LANCETS MISC Use to test blood sugar twice daily dx: E11.9 200 each 3   calcitonin, salmon, (MIACALCIN/FORTICAL) 200 UNIT/ACT nasal spray USE 1 SPRAY NASALLY(ALTERNATING NOSTRILS) EVERY DAY 3.7 mL 11   cetirizine (ZYRTEC) 5 MG tablet Take 5 mg by mouth daily.     COLLAGEN PO Take by mouth daily.     CORAL CALCIUM PO Take by mouth daily.     CRANBERRY PO Take by mouth daily.     CYANOCOBALAMIN PO Take by mouth daily.     famotidine (PEPCID AC) 10 MG tablet Take 1 tablet (10 mg total) by mouth 2 (two) times daily. 90 tablet 1    glucosamine-chondroitin 500-400 MG tablet Take 1 tablet by mouth daily.     insulin glargine (LANTUS) 100 UNIT/ML Solostar Pen 32 Units at bedtime.     Insulin Pen Needle (B-D UF III MINI PEN NEEDLES) 31G X 5 MM MISC USE 1 TO 2 TIMES DAILY 200 each 4   levothyroxine (SYNTHROID, LEVOTHROID) 100 MCG tablet Take 100 mcg by mouth daily before breakfast.     MAGNESIUM PO Take 1 tablet by mouth in the morning and at bedtime.     meclizine (ANTIVERT) 25 MG tablet Take 1 tablet (25 mg total) by mouth 3 (three) times daily as needed. 90 tablet 0   metoprolol succinate (TOPROL-XL) 50 MG 24 hr tablet TAKE 1 TABLET(50 MG) BY MOUTH DAILY 90 tablet 3   montelukast (SINGULAIR) 10 MG tablet Take 1 tablet (10 mg total) by mouth at bedtime. 90 tablet 3   Multiple Vitamin (MULTIVITAMIN) tablet Take 1 tablet by mouth daily. Centrum Silver     Multiple Vitamins-Minerals (OCUVITE PO) Take 1 tablet by mouth daily.     NOVOLOG FLEXPEN 100 UNIT/ML FlexPen 16 units 1st meal 22 units 2nd meal     Spacer/Aero-Holding Chambers (VALVED HOLDING CHAMBER) DEVI      TURMERIC PO Take 400 mg  by mouth daily.     vitamin C (ASCORBIC ACID) 500 MG tablet Take 500 mg by mouth daily.     VITAMIN D PO Take by mouth daily.     No current facility-administered medications on file prior to visit.    Allergies  Allergen Reactions   Gabapentin Swelling   Cefdinir     REACTION: unspecified   Ciprofloxacin     rash   Clarithromycin     REACTION: weird dreams (on prednisone at the same time but has tolerated this in the past)   Diclofenac Dermatitis    Topical only  Causes burning to site   Miglitol     REACTION: unspecified   Niacin     REACTION: unspecified   Qvar [Beclomethasone] Other (See Comments)    Feels funny Feels funny   Jardiance [Empagliflozin] Other (See Comments)    Yeast infection    Metformin And Related Diarrhea    Past Medical History:  Diagnosis Date   Allergy    Asthma    Diabetes mellitus     Diastolic dysfunction    a. 06/2019 Echo: EF 60-65%, no rwma, GR1 DD. Mild MR. Nl RV fxn.   Essential hypertension    GERD (gastroesophageal reflux disease)    Hyperlipidemia    Mixed hyperlipidemia    Osteopenia    PAF (paroxysmal atrial fibrillation) ([facility])    a. Dx in late 1990's - no known recurrence.   Sleep disorder    Thyroid disease     Past Surgical History:  Procedure Laterality Date   CARPAL TUNNEL RELEASE Right    CERVICAL CONE BIOPSY  07/16/1959   CHOLECYSTECTOMY     NASAL POLYP SURGERY  11/15/1999   STRABISMUS SURGERY  1946 / 1967   VAGINAL DELIVERY     x2    Family History  Problem Relation Age of Onset   Asthma Mother    Coronary artery disease Father     Social History   Socioeconomic History   Marital status: Widowed    Spouse name: Not on file   Number of children: 2   Years of education: Not on file   Highest education level: Not on file  Occupational History    Employer: RETIRED  Tobacco Use   Smoking status: Never    Passive exposure: Never   Smokeless tobacco: Never  Vaping Use   Vaping Use: Never used  Substance and Sexual Activity   Alcohol use: Not Currently    Alcohol/week: 1.0 standard drink of alcohol    Types: 1 Glasses of wine per week    Comment: 1 glass of wine-rare   Drug use: No   Sexual activity: Not Currently  Other Topics Concern   Not on file  Social History Narrative   Widowed 3/23      Has living will   Not sure about formal POA --but requests daughter [name] (and son [name]) to be health care POAs   Would accept resuscitation attempts   no feeding tubes if cognitively unaware   Social Determinants of Health   Financial Resource Strain: Not on file  Food Insecurity: Not on file  Transportation Needs: Not on file  Physical Activity: Not on file  Stress: Not on file  Social Connections: Not on file  Intimate Partner Violence: Not on file   Review of Systems No N/V Eating okay    Objective:   Physical  Exam Constitutional:      Appearance: Normal appearance.  HENT:  Head:     Comments: No sinus tenderness    Right Ear: Tympanic membrane and ear canal normal.     Left Ear: Tympanic membrane and ear canal normal.     Nose: Congestion present.     Mouth/Throat:     Pharynx: No oropharyngeal exudate or posterior oropharyngeal erythema.  Pulmonary:     Effort: Pulmonary effort is normal.     Breath sounds: Normal breath sounds. No wheezing or rales.  Neurological:     Mental Status: She is alert.            Assessment & Plan:

## 2022-09-30 ENCOUNTER — Telehealth: Payer: Self-pay | Admitting: Cardiovascular Disease

## 2022-09-30 NOTE — Telephone Encounter (Signed)
Pt c/o BP issue: STAT if pt c/o blurred vision, one-sided weakness or slurred speech  1. What are your last 5 BP readings? Blood pressure top number   have been runnningbeen 130,  to 150  2. Are you having any other symptoms (ex. Dizziness, headache, blurred vision, passed out)? Weight gain- weight fluciating- retaining fluid  3. What is your BP issue? Blood pressure runnning high fr her- patient wanted an appointment- I made an appointment for Wednesday(10-05-22)

## 2022-09-30 NOTE — Telephone Encounter (Signed)
Spoke w/ pt's daughter. She reports that pt's BP was running high and her amlodipine was increased last week by Dr. Mariah Milling. They went to PCPs office and w/ 2 BP cuffs and found that the cuff she was using was giving her false high readings, so PCP advised against increased amlodipine dosage to avoid low BPs. She reports that pt has just started weighing daily, though not at the same time of day. Pt does not have a fluid pill. She denies any SOB or swelling in feet or ankles. She drinks about 6 glasses of water per day. Nelly Rout to have pt weigh daily - 1st thing in the am after urinating, limit her fluid & sodium intake, try to keep feet elevated when sitting, and keep appt w/ Dr. Mariah Milling next week. She is agreeable and is appreciative of the call.

## 2022-10-03 ENCOUNTER — Telehealth: Payer: Self-pay | Admitting: Internal Medicine

## 2022-10-03 NOTE — Telephone Encounter (Signed)
On 6th day of antibiotic and seems to be getting worse instead of better. Can hear her wheezing and "gurgling". Not short of breath. Taking Zyrtec, albuterol inhaler, Flonase. Weight is not up. Down to 182. Going to cardiology this week. Made her another appt with Dr Alphonsus Sias tomorrow if needed. Asking could it be RSV.

## 2022-10-03 NOTE — Progress Notes (Signed)
Cardiology Office Note  Date:  10/05/2022   ID:  Hannah, [name] Dec 23, 1930, MRN [id number]  PCP:  Hannah Schwalbe, MD   Chief Complaint  Patient presents with   Hypertension    Patient c/o weight fluctuates from 1-3 lbs with some LE edema. Medications reviewed by the patient verbally.     HPI:  [name] is a 88 y.o. female with a hx of paroxysmal atrial fibrillation. diagnosed 23 years ago, age 8 In hospital ,lasted 1 week,  none since then ("heart was feeling really weird") chadsvasc 4, on asa, does not want NOAC HTN Hyperlipidemia DM II, followed by Dr. Renae [name],  Who presents for routine follow-up of her palpitations/atrial fibrillation  Last seen in clinic by myself feb 2023 Daughter presents with her today  Difficult time adjusting, lost her husband, aspiration PNA, sepsis Passed in his sleep Moved 3/23 to appt, since then has moved back into her house  In hospital 5/23, diarrhea from high dose metformin  Followed by endocrine A1C 8.5, trending up ober past few years On lantus , metformin  Carpel tunnel pain bilaterally, had surgery on 1 side  On amlodipine for BP Blood pressure seems to be relatively well controlled at home though labile  Getting over sinus infection, asthma No chest pain Uses a walker, balance problem  EKG personally reviewed by myself on todays visit Shows normal sinus rhythm with rate 67 bpm no significant ST or T wave changes  Other past medical history reviewed Prior dizzy episodes 2020 CT and MRI head Severe stenosis of right posterior artery Diffuse atherosclerosis.  On aspirin  Echo 06/2016: Normal EF >55%, normal LA size Event monitor 06/2016: NSR, no atrial fib  episode of chest pain, in the ED 07/2016  CT chest  2015: Atherosclerotic calcification aorta and minimally in coronary arteries.   PMH:   has a past medical history of Allergy, Asthma, Diabetes mellitus, Diastolic dysfunction, Essential hypertension,  GERD (gastroesophageal reflux disease), Hyperlipidemia, Mixed hyperlipidemia, Osteopenia, PAF (paroxysmal atrial fibrillation) (HCC), Sleep disorder, and Thyroid disease.  PSH:    Past Surgical History:  Procedure Laterality Date   CARPAL TUNNEL RELEASE Right    CERVICAL CONE BIOPSY  07/16/1959   CHOLECYSTECTOMY     NASAL POLYP SURGERY  11/15/1999   STRABISMUS SURGERY  1946 / 1967   VAGINAL DELIVERY     x2    Current Outpatient Medications  Medication Sig Dispense Refill   ACCU-CHEK SMARTVIEW test strip   3   acetaminophen (TYLENOL) 500 MG tablet Take 500 mg by mouth every 4 (four) hours as needed.     albuterol (VENTOLIN HFA) 108 (90 Base) MCG/ACT inhaler Inhale 2 puffs into the lungs every 6 (six) hours as needed. 18 g 1   ALOE PO Take 1 capsule by mouth daily.     ALPRAZolam (XANAX) 0.25 MG tablet TAKE 1 TABLET(0.25 MG) BY MOUTH TWICE DAILY AS NEEDED FOR ANXIETY 60 tablet 0   amLODipine (NORVASC) 5 MG tablet Take 1-2 tablets (5-10 mg total) by mouth as directed. Take 2 tablets once daily if systolic blood pressure (top blood pressure reading) remains over 130. If it drops back down then go back to 5 mg once daily. 90 tablet 1   aspirin 81 MG tablet Take 81 mg by mouth daily.     atorvastatin (LIPITOR) 20 MG tablet TAKE 1 TABLET BY MOUTH EVERY DAY 90 tablet 3   B-D ULTRA-FINE 33 LANCETS MISC Use to test blood sugar  twice daily dx: E11.9 200 each 3   calcitonin, salmon, (MIACALCIN/FORTICAL) 200 UNIT/ACT nasal spray USE 1 SPRAY NASALLY(ALTERNATING NOSTRILS) EVERY DAY 3.7 mL 11   cetirizine (ZYRTEC) 5 MG tablet Take 5 mg by mouth daily.     COLLAGEN PO Take by mouth daily.     CORAL CALCIUM PO Take by mouth daily.     CRANBERRY PO Take by mouth daily.     CYANOCOBALAMIN PO Take by mouth daily.     famotidine (PEPCID AC) 10 MG tablet Take 1 tablet (10 mg total) by mouth 2 (two) times daily. 90 tablet 1   glucosamine-chondroitin 500-400 MG tablet Take 1 tablet by mouth daily.      insulin glargine (LANTUS) 100 UNIT/ML Solostar Pen 32 Units at bedtime.     Insulin Pen Needle (B-D UF III MINI PEN NEEDLES) 31G X 5 MM MISC USE 1 TO 2 TIMES DAILY 200 each 4   levothyroxine (SYNTHROID, LEVOTHROID) 100 MCG tablet Take 100 mcg by mouth daily before breakfast.     MAGNESIUM PO Take 1 tablet by mouth in the morning and at bedtime.     meclizine (ANTIVERT) 25 MG tablet Take 1 tablet (25 mg total) by mouth 3 (three) times daily as needed. 90 tablet 0   metoprolol succinate (TOPROL-XL) 50 MG 24 hr tablet TAKE 1 TABLET(50 MG) BY MOUTH DAILY 90 tablet 3   montelukast (SINGULAIR) 10 MG tablet Take 1 tablet (10 mg total) by mouth at bedtime. 90 tablet 3   Multiple Vitamin (MULTIVITAMIN) tablet Take 1 tablet by mouth daily. Centrum Silver     Multiple Vitamins-Minerals (OCUVITE PO) Take 1 tablet by mouth daily.     NOVOLOG FLEXPEN 100 UNIT/ML FlexPen 16 units 1st meal 22 units 2nd meal     predniSONE (DELTASONE) 20 MG tablet Take 2 tablets (40 mg total) by mouth daily. For 3 days, then 1 tab daily for 3 days 9 tablet 0   Spacer/Aero-Holding Chambers (VALVED HOLDING CHAMBER) DEVI      TURMERIC PO Take 400 mg by mouth daily.     vitamin C (ASCORBIC ACID) 500 MG tablet Take 500 mg by mouth daily.     VITAMIN D PO Take by mouth daily.     amoxicillin-clavulanate (AUGMENTIN) 875-125 MG tablet Take 1 tablet by mouth 2 (two) times daily. (Patient not taking: Reported on 10/05/2022) 14 tablet 0   No current facility-administered medications for this visit.     Allergies:   Gabapentin, Cefdinir, Ciprofloxacin, Clarithromycin, Diclofenac, Miglitol, Niacin, Qvar [beclomethasone], Jardiance [empagliflozin], and Metformin and related   Social History:  The patient  reports that she has never smoked. She has never been exposed to tobacco smoke. She has never used smokeless tobacco. She reports that she does not currently use alcohol after a past usage of about 1.0 standard drink of alcohol per week.  She reports that she does not use drugs.   Family History:   family history includes Asthma in her mother; Coronary artery disease in her father.    Review of Systems: Review of Systems  Constitutional: Negative.   Respiratory: Negative.    Cardiovascular: Negative.   Gastrointestinal: Negative.   Musculoskeletal: Negative.   Neurological: Negative.   Psychiatric/Behavioral: Negative.    All other systems reviewed and are negative.   PHYSICAL EXAM: VS:  BP (!) 140/60 (BP Location: Left Arm, Patient Position: Sitting, Cuff Size: Normal)   Pulse 67   Ht 5\' 1"  (1.549 m)   Wt  185 lb 6 oz (84.1 kg)   SpO2 93%   BMI 35.03 kg/m  , BMI Body mass index is 35.03 kg/m. Constitutional:  oriented to person, place, and time. No distress.  HENT:  Head: Grossly normal Eyes:  no discharge. No scleral icterus.  Neck: No JVD, no carotid bruits  Cardiovascular: Regular rate and rhythm, no murmurs appreciated Pulmonary/Chest: Clear to auscultation bilaterally, no wheezes or rails Abdominal: Soft.  no distension.  no tenderness.  Musculoskeletal: Normal range of motion Neurological:  normal muscle tone. Coordination normal. No atrophy Skin: Skin warm and dry Psychiatric: normal affect, pleasant  Recent Labs: 05/02/2022: ALT 13; Hemoglobin 11.3; Platelets 501.0 06/02/2022: BUN 20; Creatinine, Ser 0.60; Potassium 4.8; Sodium 134; TSH 2.77    Lipid Panel Lab Results  Component Value Date   CHOL 88 05/02/2022   HDL 48.20 05/02/2022   LDLCALC 7 05/02/2022   TRIG 164.0 (H) 05/02/2022     Wt Readings from Last 3 Encounters:  10/05/22 185 lb 6 oz (84.1 kg)  10/04/22 183 lb (83 kg)  09/28/22 189 lb (85.7 kg)     ASSESSMENT AND PLAN:  Paroxysmal atrial fibrillation (HCC) episode >20 years ago Maintaining NSR  No recent afib On asa, did not want NOAC  Essential hypertension Continue amlodipine 5 daily, would recommend extra amlodipine 5 for systolic pressure over 160  Mixed  hyperlipidemia Cholesterol is at goal on the current lipid regimen. No changes to the medications were made.  Cerebrovascular disease On aspirin, cholesterol at goal  Aortic atherosclerosis (HCC) Seen on CT scan Cholesterol at goal  Diabetes: HGBA1C >8, managed by endocrine Moderate diet, on insulin  Shortness of breath Reports having some mild shortness of breath symptoms, unclear if this is recovering from bronchitis, underlying asthma Daughter concerned about diagnosis of bilateral carpal tunnel and amyloid disease Echocardiogram ordered  Stress/adjustment disorder Recent stress over the past year, lost her husband, moved for short period of time into an apartment which was stressful, now living back at home, daughter helping   Total encounter time more than 30 minutes  Greater than 50% was spent in counseling and coordination of care with the patient     Orders Placed This Encounter  Procedures   EKG 12-Lead     Signed, Dossie Arbour, M.D., Ph.D. 10/05/2022  [facility] [place], [place] [phone]

## 2022-10-03 NOTE — Telephone Encounter (Signed)
Pt daughter called in requesting a call back stated pt has an on going cough . Please advise # [phone]

## 2022-10-03 NOTE — Telephone Encounter (Signed)
Spoke to daughter. She will keep the appointment tomorrow here so an xray could be done. Will go to ER today if she worsens.

## 2022-10-04 ENCOUNTER — Encounter: Payer: Self-pay | Admitting: Internal Medicine

## 2022-10-04 ENCOUNTER — Ambulatory Visit: Payer: Medicare PPO | Admitting: Internal Medicine

## 2022-10-04 ENCOUNTER — Ambulatory Visit (INDEPENDENT_AMBULATORY_CARE_PROVIDER_SITE_OTHER)
Admission: RE | Admit: 2022-10-04 | Discharge: 2022-10-04 | Disposition: A | Discharge status: Home / Self Care | Payer: Medicare PPO | Source: Ambulatory Visit | Attending: Internal Medicine | Admitting: Internal Medicine

## 2022-10-04 VITALS — BP 118/60 | HR 70 | Temp 97.0°F | Ht 61.0 in | Wt 183.0 lb

## 2022-10-04 DIAGNOSIS — R062 Wheezing: Secondary | ICD-10-CM | POA: Diagnosis not present

## 2022-10-04 DIAGNOSIS — R059 Cough, unspecified: Secondary | ICD-10-CM | POA: Diagnosis not present

## 2022-10-04 DIAGNOSIS — J45901 Unspecified asthma with (acute) exacerbation: Secondary | ICD-10-CM | POA: Insufficient documentation

## 2022-10-04 DIAGNOSIS — J4521 Mild intermittent asthma with (acute) exacerbation: Secondary | ICD-10-CM

## 2022-10-04 MED ORDER — PREDNISONE 20 MG PO TABS: 40.0000 mg | ORAL_TABLET | Freq: Every day | ORAL | 0 refills | Status: DC

## 2022-10-04 NOTE — Assessment & Plan Note (Addendum)
Now wheezing and tight---wasn't last week Will check CXR to be sure no pneumonia Doesn't look changed from the past---reading is "suggestive of bronchitis"  Will give prednisone 40mg  daily x 3, then 20mg  daily x 3 Albuterol inhaler regularly till symptoms abate

## 2022-10-04 NOTE — Progress Notes (Signed)
Subjective:    Patient ID: [name], female    DOB: January 01, 1931, 88 y.o.   MRN: [id number]  HPI Here with daughter due to ongoing respiratory symptoms  Didn't have any response from the augmentin Feels like the infection has moved into her chest  Gray/pink sputum (after forceful cough) No fever---but taking regular tylenol No pleuritic pain--but deep breath causes cough  Albuterol has helped  Current Outpatient Medications on File Prior to Visit  Medication Sig Dispense Refill   ACCU-CHEK SMARTVIEW test strip   3   acetaminophen (TYLENOL) 500 MG tablet Take 500 mg by mouth every 4 (four) hours as needed.     albuterol (VENTOLIN HFA) 108 (90 Base) MCG/ACT inhaler Inhale 2 puffs into the lungs every 6 (six) hours as needed. 18 g 1   ALOE PO Take 1 capsule by mouth daily.     ALPRAZolam (XANAX) 0.25 MG tablet TAKE 1 TABLET(0.25 MG) BY MOUTH TWICE DAILY AS NEEDED FOR ANXIETY 60 tablet 0   amLODipine (NORVASC) 5 MG tablet Take 1-2 tablets (5-10 mg total) by mouth as directed. Take 2 tablets once daily if systolic blood pressure (top blood pressure reading) remains over 130. If it drops back down then go back to 5 mg once daily. 90 tablet 1   amoxicillin-clavulanate (AUGMENTIN) 875-125 MG tablet Take 1 tablet by mouth 2 (two) times daily. 14 tablet 0   aspirin 81 MG tablet Take 81 mg by mouth daily.     atorvastatin (LIPITOR) 20 MG tablet TAKE 1 TABLET BY MOUTH EVERY DAY 90 tablet 3   B-D ULTRA-FINE 33 LANCETS MISC Use to test blood sugar twice daily dx: E11.9 200 each 3   calcitonin, salmon, (MIACALCIN/FORTICAL) 200 UNIT/ACT nasal spray USE 1 SPRAY NASALLY(ALTERNATING NOSTRILS) EVERY DAY 3.7 mL 11   cetirizine (ZYRTEC) 5 MG tablet Take 5 mg by mouth daily.     COLLAGEN PO Take by mouth daily.     CORAL CALCIUM PO Take by mouth daily.     CRANBERRY PO Take by mouth daily.     CYANOCOBALAMIN PO Take by mouth daily.     famotidine (PEPCID AC) 10 MG tablet Take 1 tablet (10 mg total)  by mouth 2 (two) times daily. 90 tablet 1   glucosamine-chondroitin 500-400 MG tablet Take 1 tablet by mouth daily.     insulin glargine (LANTUS) 100 UNIT/ML Solostar Pen 32 Units at bedtime.     Insulin Pen Needle (B-D UF III MINI PEN NEEDLES) 31G X 5 MM MISC USE 1 TO 2 TIMES DAILY 200 each 4   levothyroxine (SYNTHROID, LEVOTHROID) 100 MCG tablet Take 100 mcg by mouth daily before breakfast.     MAGNESIUM PO Take 1 tablet by mouth in the morning and at bedtime.     meclizine (ANTIVERT) 25 MG tablet Take 1 tablet (25 mg total) by mouth 3 (three) times daily as needed. 90 tablet 0   metoprolol succinate (TOPROL-XL) 50 MG 24 hr tablet TAKE 1 TABLET(50 MG) BY MOUTH DAILY 90 tablet 3   montelukast (SINGULAIR) 10 MG tablet Take 1 tablet (10 mg total) by mouth at bedtime. 90 tablet 3   Multiple Vitamin (MULTIVITAMIN) tablet Take 1 tablet by mouth daily. Centrum Silver     Multiple Vitamins-Minerals (OCUVITE PO) Take 1 tablet by mouth daily.     NOVOLOG FLEXPEN 100 UNIT/ML FlexPen 16 units 1st meal 22 units 2nd meal     Spacer/Aero-Holding Chambers (VALVED HOLDING CHAMBER) DEVI  TURMERIC PO Take 400 mg by mouth daily.     vitamin C (ASCORBIC ACID) 500 MG tablet Take 500 mg by mouth daily.     VITAMIN D PO Take by mouth daily.     No current facility-administered medications on file prior to visit.    Allergies  Allergen Reactions   Gabapentin Swelling   Cefdinir     REACTION: unspecified   Ciprofloxacin     rash   Clarithromycin     REACTION: weird dreams (on prednisone at the same time but has tolerated this in the past)   Diclofenac Dermatitis    Topical only  Causes burning to site   Miglitol     REACTION: unspecified   Niacin     REACTION: unspecified   Qvar [Beclomethasone] Other (See Comments)    Feels funny Feels funny   Jardiance [Empagliflozin] Other (See Comments)    Yeast infection    Metformin And Related Diarrhea    Past Medical History:  Diagnosis Date    Allergy    Asthma    Diabetes mellitus    Diastolic dysfunction    a. 06/2019 Echo: EF 60-65%, no rwma, GR1 DD. Mild MR. Nl RV fxn.   Essential hypertension    GERD (gastroesophageal reflux disease)    Hyperlipidemia    Mixed hyperlipidemia    Osteopenia    PAF (paroxysmal atrial fibrillation) (HCC)    a. Dx in late 1990's - no known recurrence.   Sleep disorder    Thyroid disease     Past Surgical History:  Procedure Laterality Date   CARPAL TUNNEL RELEASE Right    CERVICAL CONE BIOPSY  07/16/1959   CHOLECYSTECTOMY     NASAL POLYP SURGERY  11/15/1999   STRABISMUS SURGERY  1946 / 1967   VAGINAL DELIVERY     x2    Family History  Problem Relation Age of Onset   Asthma Mother    Coronary artery disease Father     Social History   Socioeconomic History   Marital status: Widowed    Spouse name: Not on file   Number of children: 2   Years of education: Not on file   Highest education level: Not on file  Occupational History    Employer: RETIRED  Tobacco Use   Smoking status: Never    Passive exposure: Never   Smokeless tobacco: Never  Vaping Use   Vaping Use: Never used  Substance and Sexual Activity   Alcohol use: Not Currently    Alcohol/week: 1.0 standard drink of alcohol    Types: 1 Glasses of wine per week    Comment: 1 glass of wine-rare   Drug use: No   Sexual activity: Not Currently  Other Topics Concern   Not on file  Social History Narrative   Widowed 3/23      Has living will   Not sure about formal POA --but requests daughter [name] (and son [name]) to be health care POAs   Would accept resuscitation attempts   no feeding tubes if cognitively unaware   Social Determinants of Health   Financial Resource Strain: Not on file  Food Insecurity: Not on file  Transportation Needs: Not on file  Physical Activity: Not on file  Stress: Not on file  Social Connections: Not on file  Intimate Partner Violence: Not on file   Review of Systems No  N/V Feels tired Is able to eat     Objective:   Physical Exam Pulmonary:  Effort: Pulmonary effort is normal.     Comments: Slightly decreased breath sounds throughout Moderately increased expiratory phase and exp wheezes No crackles Musculoskeletal:     Cervical back: Neck supple.  Lymphadenopathy:     Cervical: No cervical adenopathy.            Assessment & Plan:

## 2022-10-05 ENCOUNTER — Encounter: Payer: Self-pay | Admitting: Cardiovascular Disease

## 2022-10-05 ENCOUNTER — Ambulatory Visit: Payer: Medicare PPO | Attending: Cardiovascular Disease | Admitting: Cardiovascular Disease

## 2022-10-05 VITALS — BP 140/60 | HR 67 | Ht 61.0 in | Wt 185.4 lb

## 2022-10-05 DIAGNOSIS — I700 Atherosclerosis of aorta: Secondary | ICD-10-CM

## 2022-10-05 DIAGNOSIS — I480 Paroxysmal atrial fibrillation: Secondary | ICD-10-CM

## 2022-10-05 DIAGNOSIS — R197 Diarrhea, unspecified: Secondary | ICD-10-CM

## 2022-10-05 DIAGNOSIS — I10 Essential (primary) hypertension: Secondary | ICD-10-CM

## 2022-10-05 DIAGNOSIS — E782 Mixed hyperlipidemia: Secondary | ICD-10-CM | POA: Diagnosis not present

## 2022-10-05 DIAGNOSIS — E1169 Type 2 diabetes mellitus with other specified complication: Secondary | ICD-10-CM | POA: Diagnosis not present

## 2022-10-05 NOTE — Patient Instructions (Addendum)
Medication Instructions:  No changes  If you need a refill on your cardiac medications before your next appointment, please call your pharmacy.   Lab work: No new labs needed  Testing/Procedures:  Echocardiogram for shortness of breath, paroxysmal atrial fibrillation  Your physician has requested that you have an echocardiogram. Echocardiography is a painless test that uses sound waves to create images of your heart. It provides your doctor with information about the size and shape of your heart and how well your heart's chambers and valves are working. This procedure takes approximately one hour. There are no restrictions for this procedure. Please do NOT wear cologne, perfume, aftershave, or lotions (deodorant is allowed). Please arrive 15 minutes prior to your appointment time.   Follow-Up: At [facility], you and your health needs are our priority.  As part of our continuing mission to provide you with exceptional heart care, we have created designated Provider Care Teams.  These Care Teams include your primary Cardiologist (physician) and Advanced Practice Providers (APPs -  Physician Assistants and Nurse Practitioners) who all work together to provide you with the care you need, when you need it.  You will need a follow up appointment in 12 months  Providers on your designated Care Team:   Nicolasa Ducking, NP Eula Listen, PA-C Cadence Fransico Michael, [place]  COVID-19 Vaccine Information can be found at: [email or web address] For questions related to vaccine distribution or appointments, please email [email or web address] or call [phone].

## 2022-10-10 ENCOUNTER — Other Ambulatory Visit: Payer: Self-pay | Admitting: Internal Medicine

## 2022-10-10 ENCOUNTER — Ambulatory Visit (INDEPENDENT_AMBULATORY_CARE_PROVIDER_SITE_OTHER): Payer: Medicare PPO | Admitting: Psychologist

## 2022-10-10 DIAGNOSIS — Z634 Disappearance and death of family member: Secondary | ICD-10-CM

## 2022-10-10 DIAGNOSIS — F320 Major depressive disorder, single episode, mild: Secondary | ICD-10-CM | POA: Diagnosis not present

## 2022-10-10 NOTE — Progress Notes (Signed)
[facility] Counselor/Therapist Progress Note  Patient ID: [name], MRN: [id number],    Date: 10/10/2022  Time Spent: 11:00 am to 11:50 am; total time: 50 minutes   This session was held via in person. The patient consented to in-person therapy and was in the clinician's office. Limits of confidentiality were discussed with the patient.   Treatment Type: Individual Therapy  Reported Symptoms: Tearful  Mental Status Exam: Appearance:  Well Groomed     Behavior: Appropriate  Motor: Normal  Speech/Language:  Clear and Coherent  Affect: Appropriate  Mood: normal  Thought process: normal  Thought content:   WNL  Sensory/Perceptual disturbances:   WNL  Orientation: oriented to person, place, and time/date  Attention: Good  Concentration: Good  Memory: WNL Immediate;   Good  Fund of knowledge:  Good  Insight:   Fair  Judgment:  Good  Impulse Control: Good   Risk Assessment: Danger to Self:  No Self-injurious Behavior: No Danger to Others: No Duty to Warn:no Physical Aggression / Violence:No  Access to Firearms a concern: No  Gang Involvement:No   Subjective: Beginning the session, patient described herself as doing well and did a life review of her husband's life. She processed thoughts and emotions. She denied suicidal and homicidal ideation.    Interventions:  Worked on developing a therapeutic relationship with the patient using active listening and reflective statements. Provided emotional support using empathy and validation. Reviewed the treatment plan with the patient. Reviewed events since the last session. Praised the patient for doing well. Assisted the patient in doing a life review of her husband. Attempted to provide a resource for the patient. Used socratic questions to assist the patient. Explored ways to honor her husband for the Christmas season. Explored and process next steps in counseling. Reflected on whether or not patient had met her  needs. Processed thoughts and emotions. Assessed for suicidal and homicidal ideation.   Homework: NA  Next Session: NA   Diagnosis: F32.0 major depressive affective disorder, single episode, mild and Y04.5 uncomplicated bereavement.   Plan:   Goals Begin a healthy grieving process Objectives target date for all objectives is 06/22/2023 Tell in detail the story of the current loss that is triggering symptoms Read books on the topic of grief Watch videos on the theme of grief Begin verbalizing feelings associated with the loss Attend a grief support group express thoughts and feelings about the deceased Identify and voice positives about the deceased implement acts of spiritual faith  Interventions create a safe environment and actively build trust use empathy, compassion, and support ask the patient to write a letter to the lost person conduct empty chair ask the patient to discuss and list the positives and negative aspects of the person encourage patient to rely upon his/her spiritual faith  ask client to read books on grief ask patient to watch videos about grief assist patient in identifying emotions  ask patient to attend support group   The patient and clinician reviewed the treatment plan on 08/04/2022. The patient approved of the treatment plan.   Conception Chancy, PsyD

## 2022-10-11 NOTE — Telephone Encounter (Signed)
Last filled 09-12-22 #60 Last OV 10-04-22 Acute Next OV 09-18-23 [id number] - [place], [place] - [address]

## 2022-10-20 DIAGNOSIS — M19012 Primary osteoarthritis, left shoulder: Secondary | ICD-10-CM | POA: Diagnosis not present

## 2022-10-20 DIAGNOSIS — I10 Essential (primary) hypertension: Secondary | ICD-10-CM | POA: Diagnosis not present

## 2022-10-20 DIAGNOSIS — E119 Type 2 diabetes mellitus without complications: Secondary | ICD-10-CM | POA: Diagnosis not present

## 2022-10-20 DIAGNOSIS — J45909 Unspecified asthma, uncomplicated: Secondary | ICD-10-CM | POA: Diagnosis not present

## 2022-10-20 DIAGNOSIS — N3000 Acute cystitis without hematuria: Secondary | ICD-10-CM | POA: Diagnosis not present

## 2022-10-20 DIAGNOSIS — I4891 Unspecified atrial fibrillation: Secondary | ICD-10-CM | POA: Diagnosis not present

## 2022-10-20 DIAGNOSIS — M25561 Pain in right knee: Secondary | ICD-10-CM | POA: Diagnosis not present

## 2022-10-20 DIAGNOSIS — S4992XA Unspecified injury of left shoulder and upper arm, initial encounter: Secondary | ICD-10-CM | POA: Diagnosis not present

## 2022-10-20 DIAGNOSIS — M6281 Muscle weakness (generalized): Secondary | ICD-10-CM | POA: Diagnosis not present

## 2022-10-20 DIAGNOSIS — E785 Hyperlipidemia, unspecified: Secondary | ICD-10-CM | POA: Diagnosis not present

## 2022-10-20 DIAGNOSIS — M25512 Pain in left shoulder: Secondary | ICD-10-CM | POA: Diagnosis not present

## 2022-10-20 DIAGNOSIS — M25562 Pain in left knee: Secondary | ICD-10-CM | POA: Diagnosis not present

## 2022-10-21 ENCOUNTER — Telehealth: Payer: Self-pay

## 2022-10-21 NOTE — Telephone Encounter (Signed)
Pt went to ER yesterday for a fall.

## 2022-10-21 NOTE — Telephone Encounter (Signed)
Left a message on VM to see how she was doing after recent ER visit.

## 2022-10-23 IMAGING — CT CT CERVICAL SPINE W/O CM
3 of 4 series · 13 of 33 positions shown, 16 images · non-contrast
Comparison: MRI cervical spine 03/06/2018

CLINICAL DATA: Fall with facial injury.

EXAM:
CT CERVICAL SPINE WITHOUT CONTRAST
TECHNIQUE: Multidetector CT imaging of the cervical spine was performed without
intravenous contrast. Multiplanar CT image reconstructions were also
generated.

[Series 4: sagittal bone · sagittal · 0.26mm/px · 5 of 61 slices shown, 6 images]
[im 21/61  bone]
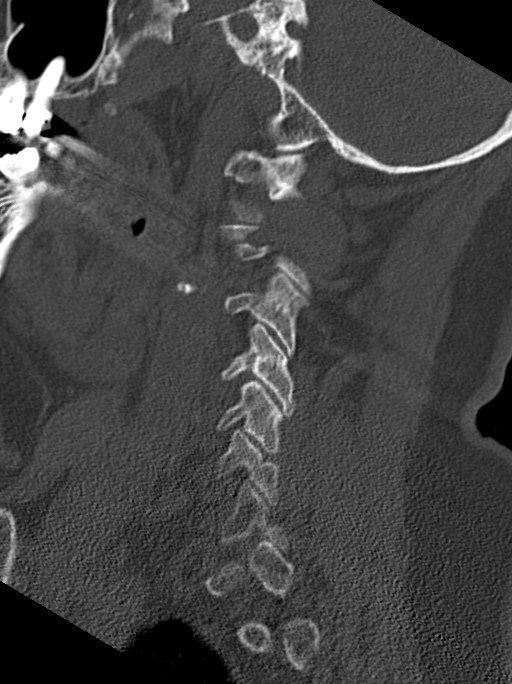
[im 26/61  bone]
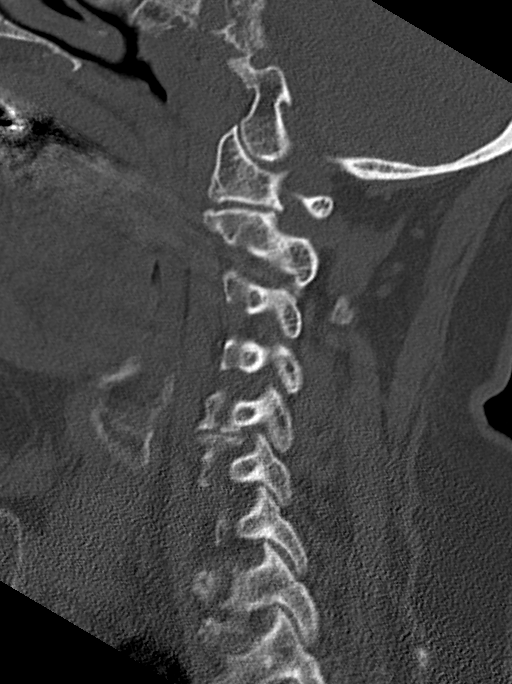
[im 31/61  soft-tissue]
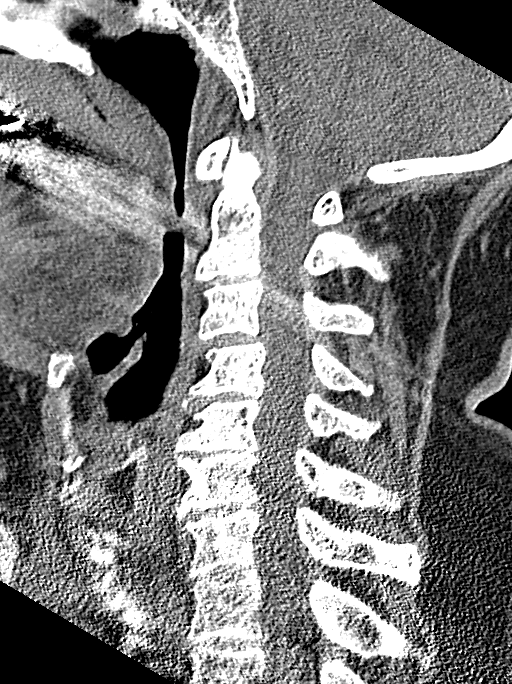
[im 31/61  bone]
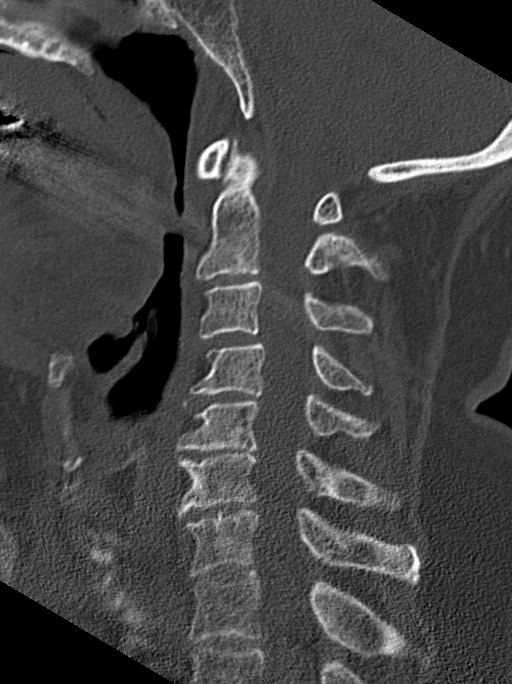
[im 36/61  bone]
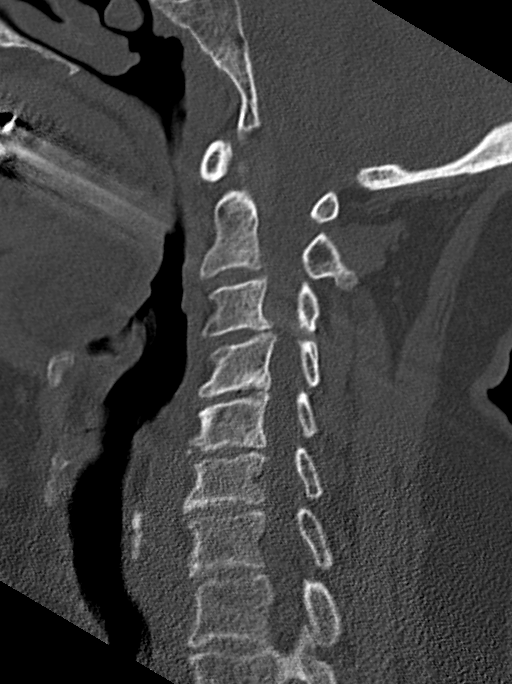
[im 41/61  bone]
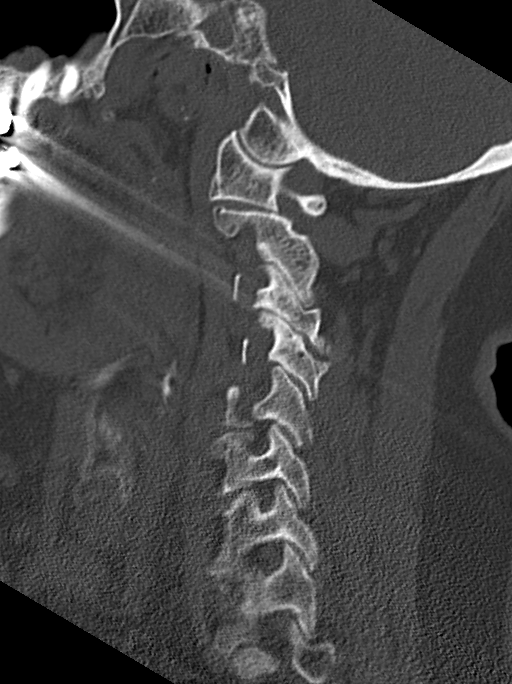

[Series 5: coronal bone · coronal · 0.26mm/px · 3 of 49 slices shown]
[im 11/49  bone]
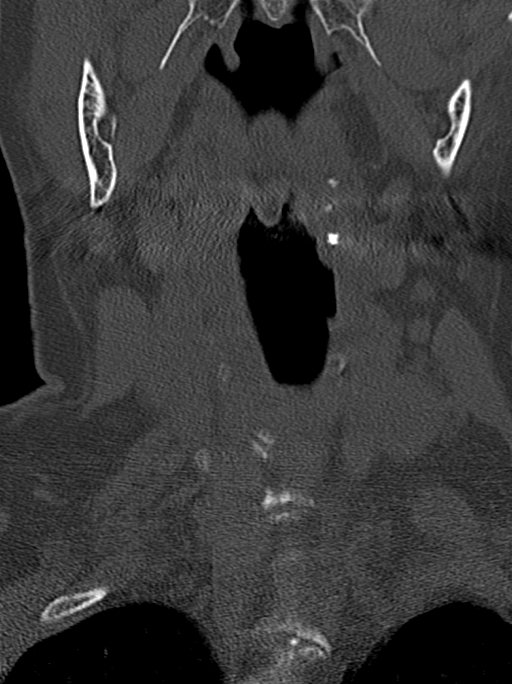
[im 20/49  bone]
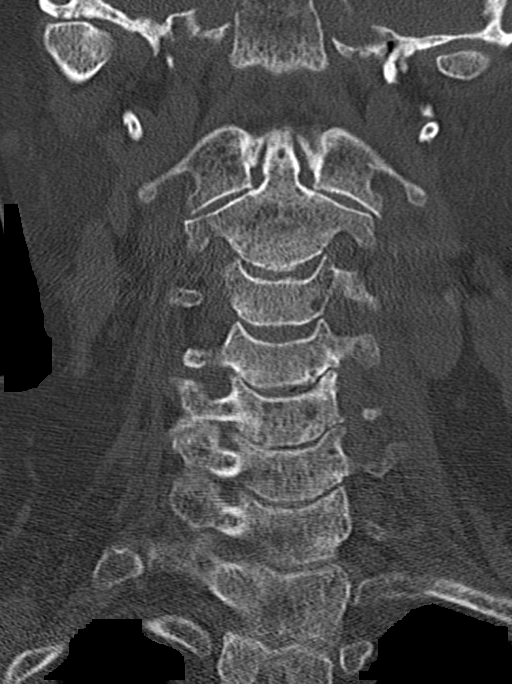
[im 29/49  bone]
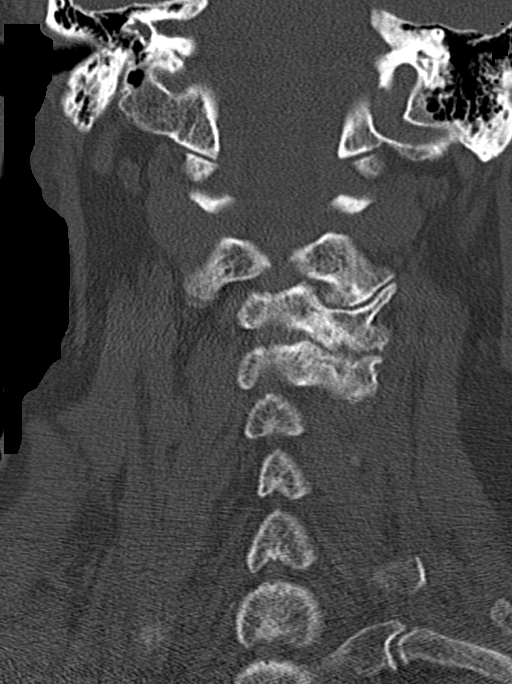

[Series 6: orthogonal axials · axial · 0.23mm/px · z∈[+303,+401]mm · 5 of 86 slices shown, 7 images]
[im 15/86  soft-tissue]
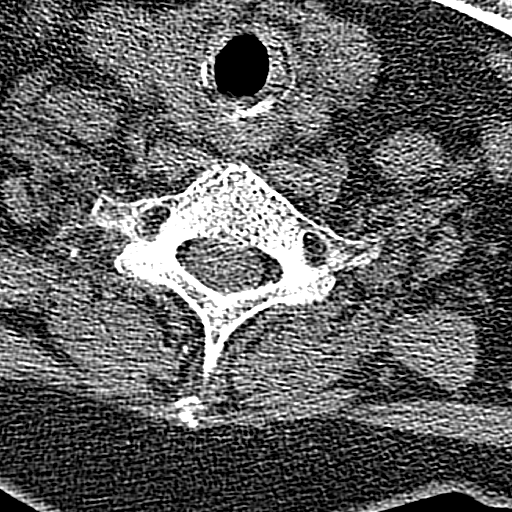
[im 15/86  bone]
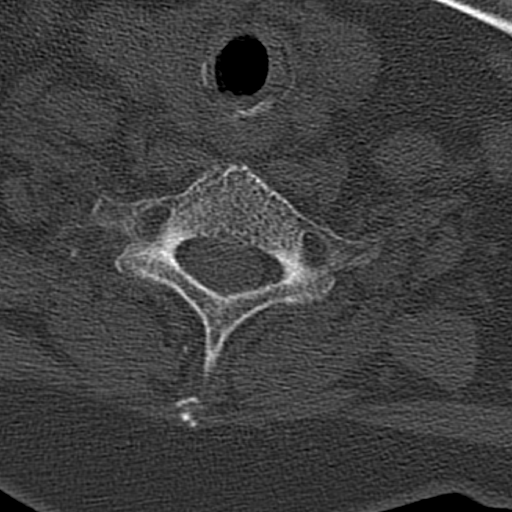
[im 29/86  bone]
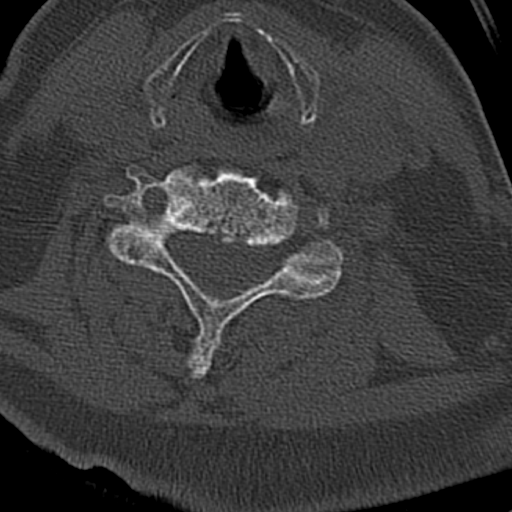
[im 43/86  bone]
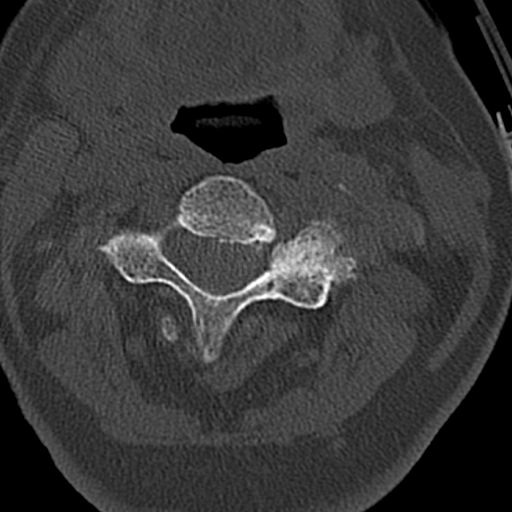
[im 57/86  bone]
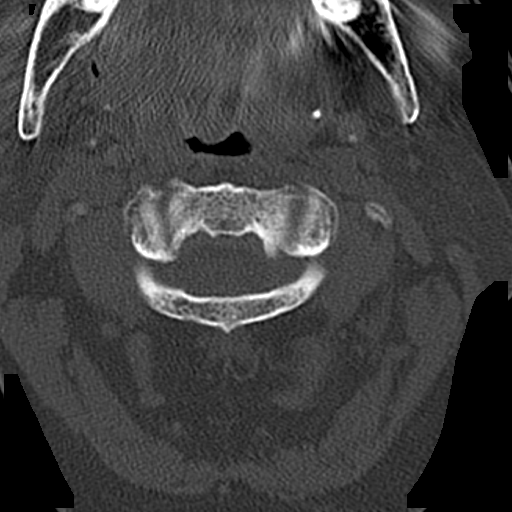
[im 71/86  soft-tissue]
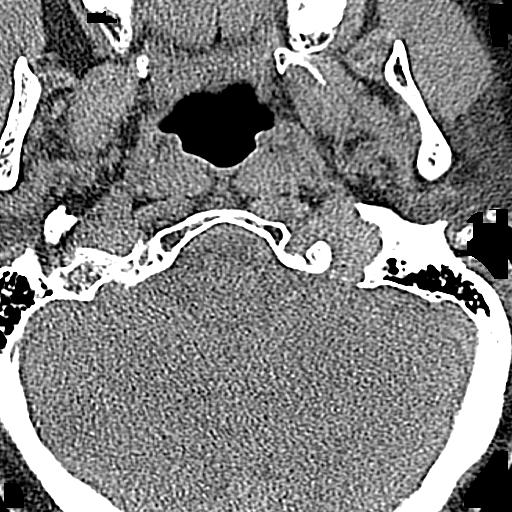
[im 71/86  bone]
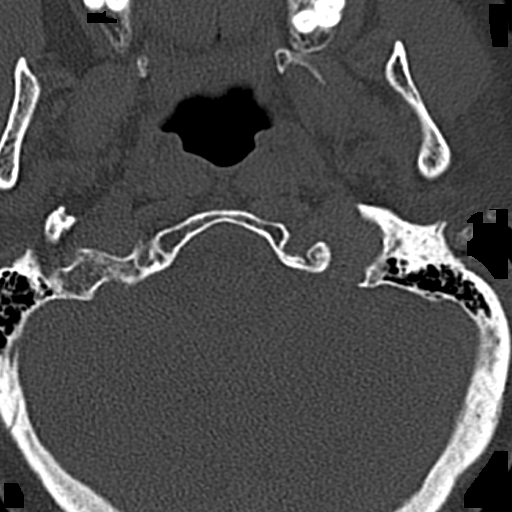

[13 of 33 positions shown; findings below may reference images not displayed]

FINDINGS: Alignment: 2 mm of chronic degenerative anterolisthesis at C3-4, not
changed from 03/06/2018.

Skull base and vertebrae: No fracture or acute bony findings.

Soft tissues and spinal canal: Unremarkable

Disc levels: Uncinate and facet spurring cause left foraminal
stenosis at C3-4 and C4-5, and right foraminal stenosis at C5-6.
Substantial degenerative facet arthropathy on the left at C2-3 and
C3-4.

Upper chest: Unremarkable

Other: No supplemental non-categorized findings.
IMPRESSION: 1. No acute cervical spine findings.
2. 2 mm chronic degenerative anterolisthesis at C3-4.
3. Spondylosis leading to foraminal impingement at C3-4, C4-5, and
C5-6.

## 2022-10-23 IMAGING — CR DG SHOULDER 2+V*R*
1 series · 3 of 3 positions shown · non-contrast
Comparison: None.

CLINICAL DATA: Recent fall with right shoulder pain, initial
encounter

EXAM:
RIGHT SHOULDER - 2+ VIEW

[Series 1: dg shoulder right · 0.14mm/px · 3 of 3 slices shown]
[im 1/3]
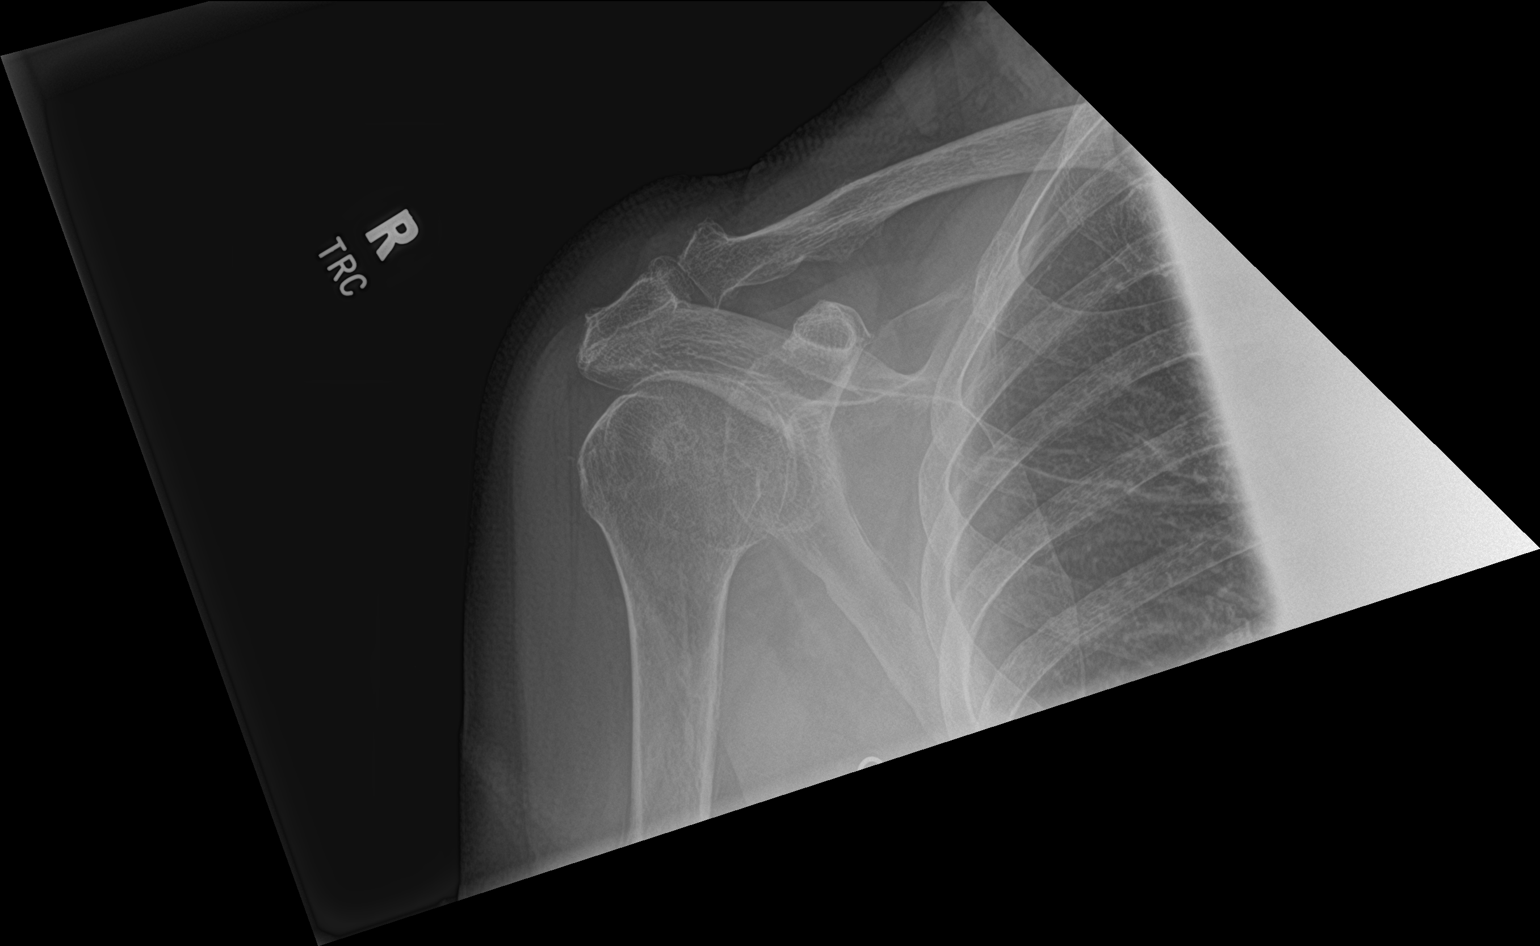
[im 2/3]
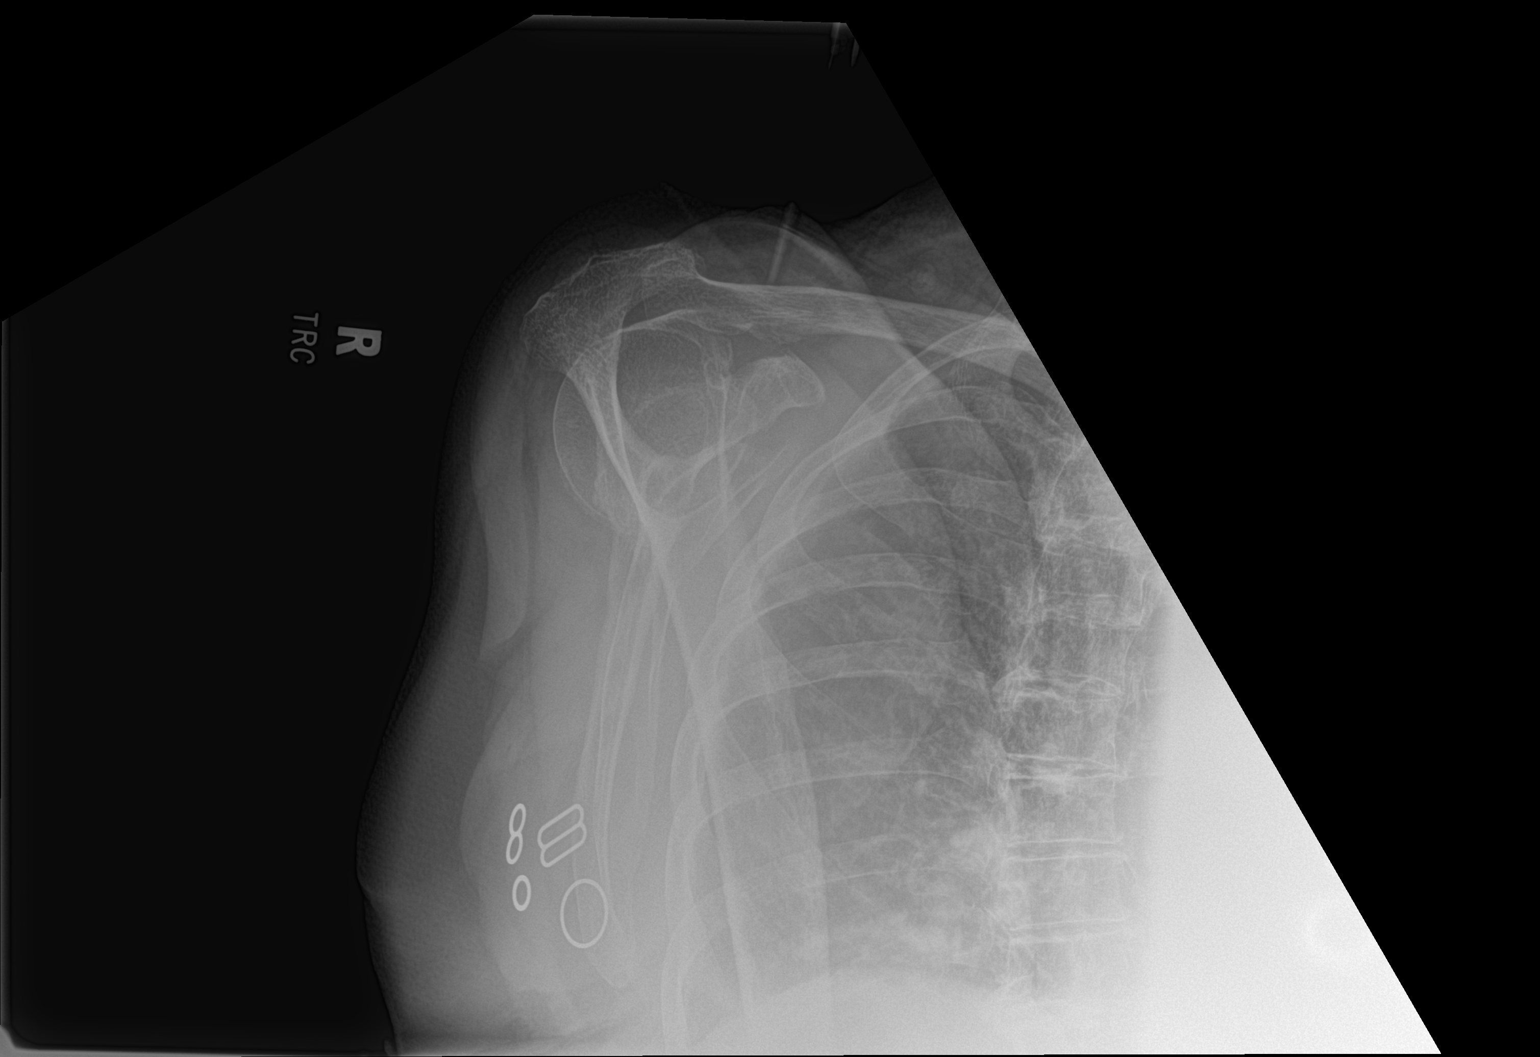
[im 3/3]
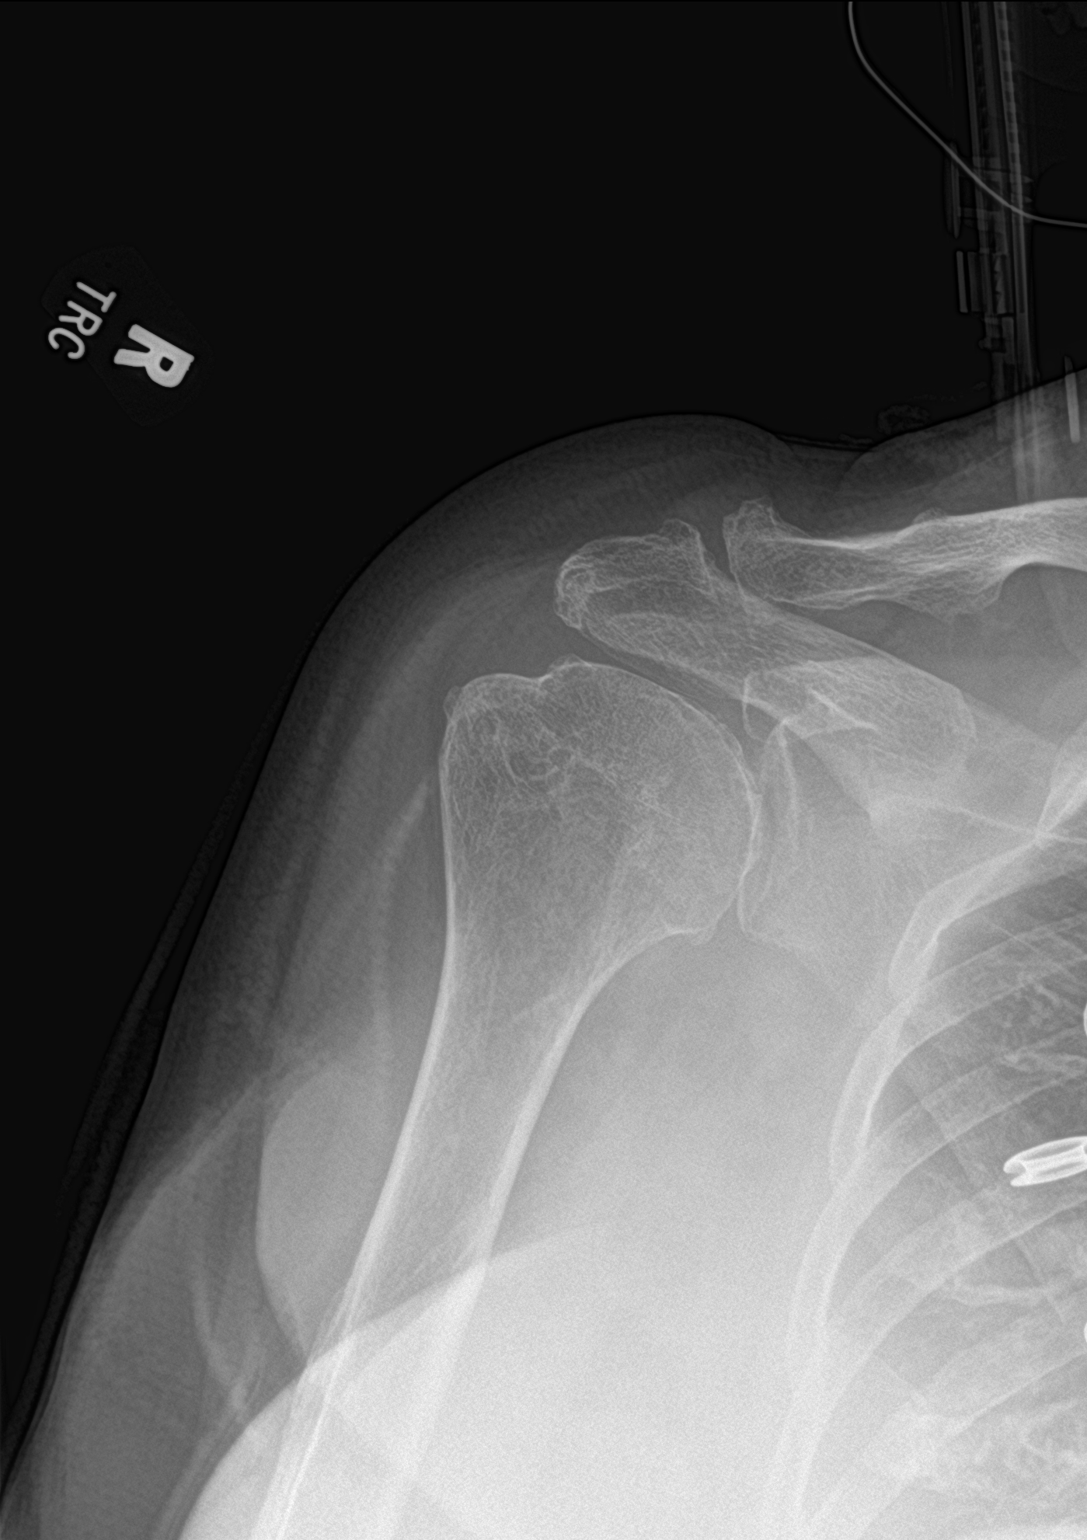

[3 of 3 positions shown; findings below may reference images not displayed]

FINDINGS: Degenerative changes of the acromioclavicular joint are noted. No
acute fracture or dislocation is seen. No soft tissue abnormality is
noted.
IMPRESSION: Degenerative change without acute abnormality.

## 2022-10-24 NOTE — Telephone Encounter (Signed)
Called and spoke to pt's daughter, [name]. Said she ended up having a UTI. Feeling sore today. Felt a lot better yesterday.

## 2022-10-24 NOTE — Telephone Encounter (Signed)
Patient daughter [name] called back in returning a call she received in regards to [name]. She can be reached [phone]

## 2022-11-10 ENCOUNTER — Other Ambulatory Visit: Payer: Self-pay | Admitting: Internal Medicine

## 2022-11-15 NOTE — Telephone Encounter (Signed)
Last filled 10-11-22 #60 Last OV 10-04-22 Acute Next OV 09-18-23 [facility] #[id number] - [place], [place]

## 2022-11-21 DIAGNOSIS — Z794 Long term (current) use of insulin: Secondary | ICD-10-CM | POA: Diagnosis not present

## 2022-11-21 DIAGNOSIS — E1169 Type 2 diabetes mellitus with other specified complication: Secondary | ICD-10-CM | POA: Diagnosis not present

## 2022-11-21 DIAGNOSIS — E113293 Type 2 diabetes mellitus with mild nonproliferative diabetic retinopathy without macular edema, bilateral: Secondary | ICD-10-CM | POA: Diagnosis not present

## 2022-11-21 DIAGNOSIS — E669 Obesity, unspecified: Secondary | ICD-10-CM | POA: Diagnosis not present

## 2022-11-21 DIAGNOSIS — E063 Autoimmune thyroiditis: Secondary | ICD-10-CM | POA: Diagnosis not present

## 2022-11-21 DIAGNOSIS — E1165 Type 2 diabetes mellitus with hyperglycemia: Secondary | ICD-10-CM | POA: Diagnosis not present

## 2022-11-24 ENCOUNTER — Ambulatory Visit: Payer: Medicare PPO | Attending: Cardiovascular Disease

## 2022-11-24 DIAGNOSIS — I480 Paroxysmal atrial fibrillation: Secondary | ICD-10-CM | POA: Diagnosis not present

## 2022-11-24 MED ORDER — PERFLUTREN LIPID MICROSPHERE
1.0000 mL | INTRAVENOUS | Status: AC | PRN
  Administered 2022-11-24: 2 mL via INTRAVENOUS

## 2022-11-26 LAB — ECHOCARDIOGRAM COMPLETE
AR max vel: 2.29 cm2
AV Area VTI: 2.79 cm2
AV Area mean vel: 2.55 cm2
AV Mean grad: 4 mmHg
AV Peak grad: 6.7 mmHg
Ao pk vel: 1.29 m/s
Area-P 1/2: 2.4 cm2

## 2022-12-05 ENCOUNTER — Telehealth: Payer: Self-pay | Admitting: Internal Medicine

## 2022-12-05 NOTE — Telephone Encounter (Signed)
Pt's daughter, [name], called requesting a call back from [facility] regarding some meds. [name] stated the pt use to take omeprazole & was switched to another medication last year. [name] states the pt wanted to start back taking Omeprazole only if [place] thought it was wise, along with taking other meds as well. [name] stated she isn't sure why the pt was taken off Omeprazole. Call back # [phone]

## 2022-12-06 MED ORDER — OMEPRAZOLE 20 MG PO CPDR: 20.0000 mg | DELAYED_RELEASE_CAPSULE | Freq: Every day | ORAL | 3 refills | Status: DC

## 2022-12-06 NOTE — Addendum Note (Signed)
Addended by: Pilar Grammes on: 12/06/2022 03:23 PM   Modules accepted: Orders

## 2022-12-06 NOTE — Telephone Encounter (Addendum)
Spoke to pt's daughter, [name]. She has omeprazole 20mg  daily right now left from 2022. I have sent omeprazole 20 mg once daily to the pharmacy.

## 2022-12-18 ENCOUNTER — Other Ambulatory Visit: Payer: Self-pay | Admitting: Cardiovascular Disease

## 2022-12-19 ENCOUNTER — Other Ambulatory Visit: Payer: Self-pay | Admitting: Internal Medicine

## 2022-12-19 NOTE — Telephone Encounter (Signed)
Please advise on directions. Rx states to take 2 tablets if systolic BP above 127. Per last office note: Essential hypertension Continue amlodipine 5 daily, would recommend extra amlodipine 5 for systolic pressure over 517 Thank you!

## 2022-12-19 NOTE — Telephone Encounter (Signed)
Last filled 11-15-22 #60 Last OV 10-04-22 Acute Next OV 09-18-23 [facility] #[id number] - [place], [place]

## 2022-12-30 DIAGNOSIS — H0014 Chalazion left upper eyelid: Secondary | ICD-10-CM | POA: Diagnosis not present

## 2023-01-03 ENCOUNTER — Other Ambulatory Visit: Payer: Self-pay | Admitting: Cardiovascular Disease

## 2023-01-04 ENCOUNTER — Encounter: Payer: Self-pay | Admitting: Internal Medicine

## 2023-01-04 ENCOUNTER — Ambulatory Visit (INDEPENDENT_AMBULATORY_CARE_PROVIDER_SITE_OTHER): Payer: Medicare PPO | Admitting: Internal Medicine

## 2023-01-04 VITALS — BP 136/74 | HR 76 | Temp 97.3°F | Ht 61.0 in | Wt 197.0 lb

## 2023-01-04 DIAGNOSIS — J0100 Acute maxillary sinusitis, unspecified: Secondary | ICD-10-CM | POA: Diagnosis not present

## 2023-01-04 MED ORDER — AMOXICILLIN-POT CLAVULANATE 875-125 MG PO TABS: 1.0000 | ORAL_TABLET | Freq: Two times a day (BID) | ORAL | 0 refills | Status: DC

## 2023-01-04 NOTE — Assessment & Plan Note (Signed)
Will go ahead with augmentin 875 bid x 1 week Continue singulair, zyrtec,flonase Asthma not exacerbated--hold off on prednisone

## 2023-01-04 NOTE — Progress Notes (Signed)
Subjective:    Patient ID: [name], female    DOB: 1931/02/03, 89 y.o.   MRN: [id number]  HPI Here with daughter [name] due to respiratory illness  Feels like she has a bronchial infection--goes back 4-5 days Not really wheezing Breathing still okay Some maxiillary headache Some sore throat Some discomfort in right ear and along neck Lots of nasal discharge and sneezing---and post nasal drip No fever or sweats. Slight chills  Tried tylenol and regular nasal meds  Current Outpatient Medications on File Prior to Visit  Medication Sig Dispense Refill   ACCU-CHEK SMARTVIEW test strip   3   acetaminophen (TYLENOL) 500 MG tablet Take 500 mg by mouth every 4 (four) hours as needed.     albuterol (VENTOLIN HFA) 108 (90 Base) MCG/ACT inhaler Inhale 2 puffs into the lungs every 6 (six) hours as needed. 18 g 1   ALOE PO Take 1 capsule by mouth daily.     ALPRAZolam (XANAX) 0.25 MG tablet TAKE 1 TABLET(0.25 MG) BY MOUTH TWICE DAILY AS NEEDED FOR ANXIETY 60 tablet 0   amLODipine (NORVASC) 5 MG tablet Take 1-2 tablets (5-10 mg total) by mouth as directed. Take 2 tablets once daily if systolic blood pressure (top blood pressure reading) remains over 130. If it drops back down then go back to 5 mg once daily. 90 tablet 1   aspirin 81 MG tablet Take 81 mg by mouth daily.     atorvastatin (LIPITOR) 20 MG tablet TAKE 1 TABLET BY MOUTH EVERY DAY 90 tablet 3   B-D ULTRA-FINE 33 LANCETS MISC Use to test blood sugar twice daily dx: E11.9 200 each 3   calcitonin, salmon, (MIACALCIN/FORTICAL) 200 UNIT/ACT nasal spray USE 1 SPRAY NASALLY(ALTERNATING NOSTRILS) EVERY DAY 3.7 mL 11   cetirizine (ZYRTEC) 5 MG tablet Take 5 mg by mouth daily.     COLLAGEN PO Take by mouth daily.     CORAL CALCIUM PO Take by mouth daily.     CRANBERRY PO Take by mouth daily.     CYANOCOBALAMIN PO Take by mouth daily.     glucosamine-chondroitin 500-400 MG tablet Take 1 tablet by mouth daily.     insulin glargine  (LANTUS) 100 UNIT/ML Solostar Pen 32 Units at bedtime.     Insulin Pen Needle (B-D UF III MINI PEN NEEDLES) 31G X 5 MM MISC USE 1 TO 2 TIMES DAILY 200 each 4   levothyroxine (SYNTHROID, LEVOTHROID) 100 MCG tablet Take 100 mcg by mouth daily before breakfast.     MAGNESIUM PO Take 1 tablet by mouth in the morning and at bedtime.     meclizine (ANTIVERT) 25 MG tablet Take 1 tablet (25 mg total) by mouth 3 (three) times daily as needed. 90 tablet 0   metoprolol succinate (TOPROL-XL) 50 MG 24 hr tablet TAKE 1 TABLET(50 MG) BY MOUTH DAILY 90 tablet 2   montelukast (SINGULAIR) 10 MG tablet Take 1 tablet (10 mg total) by mouth at bedtime. 90 tablet 3   Multiple Vitamin (MULTIVITAMIN) tablet Take 1 tablet by mouth daily. Centrum Silver     Multiple Vitamins-Minerals (OCUVITE PO) Take 1 tablet by mouth daily.     NOVOLOG FLEXPEN 100 UNIT/ML FlexPen 16 units 1st meal 22 units 2nd meal     omeprazole (PRILOSEC) 20 MG capsule Take 1 capsule (20 mg total) by mouth daily. 90 capsule 3   Spacer/Aero-Holding Chambers (VALVED HOLDING CHAMBER) DEVI      TURMERIC PO Take 400 mg by  mouth daily.     vitamin C (ASCORBIC ACID) 500 MG tablet Take 500 mg by mouth daily.     VITAMIN D PO Take by mouth daily.     No current facility-administered medications on file prior to visit.    Allergies  Allergen Reactions   Gabapentin Swelling   Cefdinir     REACTION: unspecified   Ciprofloxacin     rash   Clarithromycin     REACTION: weird dreams (on prednisone at the same time but has tolerated this in the past)   Diclofenac Dermatitis    Topical only  Causes burning to site   Miglitol     REACTION: unspecified   Niacin     REACTION: unspecified   Qvar [Beclomethasone] Other (See Comments)    Feels funny Feels funny   Jardiance [Empagliflozin] Other (See Comments)    Yeast infection    Metformin And Related Diarrhea    Past Medical History:  Diagnosis Date   Allergy    Asthma    Diabetes mellitus     Diastolic dysfunction    a. 06/2019 Echo: EF 60-65%, no rwma, GR1 DD. Mild MR. Nl RV fxn.   Essential hypertension    GERD (gastroesophageal reflux disease)    Hyperlipidemia    Mixed hyperlipidemia    Osteopenia    PAF (paroxysmal atrial fibrillation) ([facility])    a. Dx in late 1990's - no known recurrence.   Sleep disorder    Thyroid disease     Past Surgical History:  Procedure Laterality Date   CARPAL TUNNEL RELEASE Right    CERVICAL CONE BIOPSY  07/16/1959   CHOLECYSTECTOMY     NASAL POLYP SURGERY  11/15/1999   STRABISMUS SURGERY  1946 / 1967   VAGINAL DELIVERY     x2    Family History  Problem Relation Age of Onset   Asthma Mother    Coronary artery disease Father     Social History   Socioeconomic History   Marital status: Widowed    Spouse name: Not on file   Number of children: 2   Years of education: Not on file   Highest education level: Not on file  Occupational History    Employer: RETIRED  Tobacco Use   Smoking status: Never    Passive exposure: Never   Smokeless tobacco: Never  Vaping Use   Vaping Use: Never used  Substance and Sexual Activity   Alcohol use: Not Currently    Alcohol/week: 1.0 standard drink of alcohol    Types: 1 Glasses of wine per week    Comment: 1 glass of wine-rare   Drug use: No   Sexual activity: Not Currently  Other Topics Concern   Not on file  Social History Narrative   Widowed 3/23      Has living will   Not sure about formal POA --but requests daughter [name] (and son [name]) to be health care POAs   Would accept resuscitation attempts   no feeding tubes if cognitively unaware   Social Determinants of Health   Financial Resource Strain: Not on file  Food Insecurity: Not on file  Transportation Needs: Not on file  Physical Activity: Not on file  Stress: Not on file  Social Connections: Not on file  Intimate Partner Violence: Not on file   Review of Systems Had stye in eye--got antibiotic eye drops and  ointment before the respiratory symptoms Did have some nausea from the drainage--no vomiting Eating okay Sugars  up some lately--going back to endocrine next month (but better than at the last visit with increase in lantus)      Physical Exam Constitutional:      Appearance: Normal appearance.  HENT:     Head:     Comments: Mild maxillary tenderness    Left Ear: Tympanic membrane and ear canal normal.     Ears:     Comments: Right TM retracted but not inflamed    Nose: Congestion present.     Mouth/Throat:     Pharynx: No oropharyngeal exudate.  Pulmonary:     Effort: Pulmonary effort is normal.     Breath sounds: No rales.     Comments: Very slight expiratory prolongation and scant wheeze No sig tightness though Musculoskeletal:     Cervical back: Neck supple.  Lymphadenopathy:     Cervical: No cervical adenopathy.  Neurological:     Mental Status: She is alert.            Assessment & Plan:

## 2023-01-06 ENCOUNTER — Telehealth: Payer: Self-pay | Admitting: Internal Medicine

## 2023-01-06 NOTE — Telephone Encounter (Signed)
Pt daughter [name] called in stated pt cough has gotten worst requesting something be called in to pharmacy// pt was seen on 01/04/23 . Please advise [phone]

## 2023-01-06 NOTE — Telephone Encounter (Signed)
Called and spoke to [name]. Advised her Dr Silvio Pate is out of the office today and the providers here in the office will not get it done before its time to leave. She will give her som Coricidin HBP tonight and if she is not any better, she will do a virtual visit on [email or web address] over the weekend.

## 2023-01-21 ENCOUNTER — Other Ambulatory Visit: Payer: Self-pay | Admitting: Internal Medicine

## 2023-01-23 NOTE — Telephone Encounter (Signed)
Last filled 12-19-22 #60 Last OV 01-04-23 Acute Next OV 09-18-23 [facility] [id number] - [place], [place]

## 2023-01-25 ENCOUNTER — Ambulatory Visit (INDEPENDENT_AMBULATORY_CARE_PROVIDER_SITE_OTHER): Payer: Medicare PPO | Admitting: Internal Medicine

## 2023-01-25 ENCOUNTER — Encounter: Payer: Self-pay | Admitting: Internal Medicine

## 2023-01-25 VITALS — BP 132/80 | HR 71 | Temp 97.0°F | Ht 61.0 in | Wt 200.0 lb

## 2023-01-25 DIAGNOSIS — R440 Auditory hallucinations: Secondary | ICD-10-CM | POA: Diagnosis not present

## 2023-01-25 LAB — CBC
HCT: 39.6 % (ref 36.0–46.0)
Hemoglobin: 13.4 g/dL (ref 12.0–15.0)
MCHC: 33.7 g/dL (ref 30.0–36.0)
MCV: 89.5 fl (ref 78.0–100.0)
Platelets: 253 10*3/uL (ref 150.0–400.0)
RBC: 4.42 Mil/uL (ref 3.87–5.11)
RDW: 14.4 % (ref 11.5–15.5)
WBC: 7.8 10*3/uL (ref 4.0–10.5)

## 2023-01-25 LAB — COMPREHENSIVE METABOLIC PANEL
ALT: 15 U/L (ref 0–35)
AST: 20 U/L (ref 0–37)
Albumin: 3.8 g/dL (ref 3.5–5.2)
Alkaline Phosphatase: 91 U/L (ref 39–117)
BUN: 21 mg/dL (ref 6–23)
CO2: 29 mEq/L (ref 19–32)
Calcium: 9.2 mg/dL (ref 8.4–10.5)
Chloride: 99 mEq/L (ref 96–112)
Creatinine, Ser: 0.78 mg/dL (ref 0.40–1.20)
GFR: 67.07 mL/min (ref >60.00)
Glucose, Bld: 188 mg/dL — ABNORMAL HIGH (ref 70–99)
Potassium: 4.9 mEq/L (ref 3.5–5.1)
Sodium: 136 mEq/L (ref 135–145)
Total Bilirubin: 0.5 mg/dL (ref 0.2–1.2)
Total Protein: 6.6 g/dL (ref 6.0–8.3)

## 2023-01-25 LAB — TSH: TSH: 4.43 u[IU]/mL (ref 0.35–5.50)

## 2023-01-25 NOTE — Progress Notes (Signed)
Subjective:    Patient ID: [name], female    DOB: [birth year], 89 y.o.   MRN: [id number]  HPI Here with daughter due to hearing things  She is hearing things that others do not hear Thinks that people are talking in the other room---or listening to a radio (like laughing or a party) Started about 2 weeks ago May occur upon awakening--but no always  Sleeping okay Mood not great--- about a year since husband died (but not striking) Does have audiology appointment coming up (the sense of radio/voices happening whether aides in or not)  Uses the xanax at night---1 bedtime and another in the middle of the night--same as always No changes in memory or thinking  Current Outpatient Medications on File Prior to Visit  Medication Sig Dispense Refill   ACCU-CHEK SMARTVIEW test strip   3   acetaminophen (TYLENOL) 500 MG tablet Take 500 mg by mouth every 4 (four) hours as needed.     albuterol (VENTOLIN HFA) 108 (90 Base) MCG/ACT inhaler Inhale 2 puffs into the lungs every 6 (six) hours as needed. 18 g 1   ALOE PO Take 1 capsule by mouth daily.     ALPRAZolam (XANAX) 0.25 MG tablet TAKE 1 TABLET(0.25 MG) BY MOUTH TWICE DAILY AS NEEDED FOR ANXIETY 60 tablet 0   amLODipine (NORVASC) 5 MG tablet Take 1-2 tablets (5-10 mg total) by mouth as directed. Take 2 tablets once daily if systolic blood pressure (top blood pressure reading) remains over 130. If it drops back down then go back to 5 mg once daily. 90 tablet 1   aspirin 81 MG tablet Take 81 mg by mouth daily.     atorvastatin (LIPITOR) 20 MG tablet TAKE 1 TABLET BY MOUTH EVERY DAY 90 tablet 3   B-D ULTRA-FINE 33 LANCETS MISC Use to test blood sugar twice daily dx: E11.9 200 each 3   calcitonin, salmon, (MIACALCIN/FORTICAL) 200 UNIT/ACT nasal spray USE 1 SPRAY NASALLY(ALTERNATING NOSTRILS) EVERY DAY 3.7 mL 11   cetirizine (ZYRTEC) 5 MG tablet Take 5 mg by mouth daily.     COLLAGEN PO Take by mouth daily.     CORAL CALCIUM PO Take by  mouth daily.     CRANBERRY PO Take by mouth daily.     CYANOCOBALAMIN PO Take by mouth daily.     glucosamine-chondroitin 500-400 MG tablet Take 1 tablet by mouth daily.     insulin glargine (LANTUS) 100 UNIT/ML Solostar Pen 36 Units at bedtime.     Insulin Pen Needle (B-D UF III MINI PEN NEEDLES) 31G X 5 MM MISC USE 1 TO 2 TIMES DAILY 200 each 4   KRILL OIL PO Take by mouth.     levothyroxine (SYNTHROID, LEVOTHROID) 100 MCG tablet Take 100 mcg by mouth daily before breakfast.     MAGNESIUM PO Take 1 tablet by mouth in the morning and at bedtime.     meclizine (ANTIVERT) 25 MG tablet Take 1 tablet (25 mg total) by mouth 3 (three) times daily as needed. 90 tablet 0   metoprolol succinate (TOPROL-XL) 50 MG 24 hr tablet TAKE 1 TABLET(50 MG) BY MOUTH DAILY 90 tablet 2   montelukast (SINGULAIR) 10 MG tablet Take 1 tablet (10 mg total) by mouth at bedtime. 90 tablet 3   Multiple Vitamin (MULTIVITAMIN) tablet Take 1 tablet by mouth daily. Centrum Silver     Multiple Vitamins-Minerals (OCUVITE PO) Take 1 tablet by mouth daily.     NOVOLOG FLEXPEN 100 UNIT/ML  FlexPen 16 units 1st meal 22 units 2nd meal     omeprazole (PRILOSEC) 20 MG capsule Take 1 capsule (20 mg total) by mouth daily. 90 capsule 3   Spacer/Aero-Holding Chambers (VALVED HOLDING CHAMBER) DEVI      TURMERIC PO Take 400 mg by mouth daily.     vitamin C (ASCORBIC ACID) 500 MG tablet Take 500 mg by mouth daily.     VITAMIN D PO Take by mouth daily.     No current facility-administered medications on file prior to visit.    Allergies  Allergen Reactions   Gabapentin Swelling   Cefdinir     REACTION: unspecified   Ciprofloxacin     rash   Clarithromycin     REACTION: weird dreams (on prednisone at the same time but has tolerated this in the past)   Diclofenac Dermatitis    Topical only  Causes burning to site   Miglitol     REACTION: unspecified   Niacin     REACTION: unspecified   Qvar [Beclomethasone] Other (See Comments)     Feels funny Feels funny   Jardiance [Empagliflozin] Other (See Comments)    Yeast infection    Metformin And Related Diarrhea    Past Medical History:  Diagnosis Date   Allergy    Asthma    Diabetes mellitus    Diastolic dysfunction    a. 06/2019 Echo: EF 60-65%, no rwma, GR1 DD. Mild MR. Nl RV fxn.   Essential hypertension    GERD (gastroesophageal reflux disease)    Hyperlipidemia    Mixed hyperlipidemia    Osteopenia    PAF (paroxysmal atrial fibrillation) ([place])    a. Dx in late 1990's - no known recurrence.   Sleep disorder    Thyroid disease     Past Surgical History:  Procedure Laterality Date   CARPAL TUNNEL RELEASE Right    CERVICAL CONE BIOPSY  07/16/1959   CHOLECYSTECTOMY     NASAL POLYP SURGERY  11/15/1999   STRABISMUS SURGERY  1946 / 1967   VAGINAL DELIVERY     x2    Family History  Problem Relation Age of Onset   Asthma Mother    Coronary artery disease Father     Social History   Socioeconomic History   Marital status: Widowed    Spouse name: Not on file   Number of children: 2   Years of education: Not on file   Highest education level: Not on file  Occupational History    Employer: RETIRED  Tobacco Use   Smoking status: Never    Passive exposure: Never   Smokeless tobacco: Never  Vaping Use   Vaping Use: Never used  Substance and Sexual Activity   Alcohol use: Not Currently    Alcohol/week: 1.0 standard drink of alcohol    Types: 1 Glasses of wine per week    Comment: 1 glass of wine-rare   Drug use: No   Sexual activity: Not Currently  Other Topics Concern   Not on file  Social History Narrative   Widowed 3/23      Has living will   Not sure about formal POA --but requests daughter [name] (and son Fara Olden) to be health care POAs   Would accept resuscitation attempts   no feeding tubes if cognitively unaware   Social Determinants of Health   Financial Resource Strain: Not on file  Food Insecurity: Not on file   Transportation Needs: Not on file  Physical Activity: Not  on file  Stress: Not on file  Social Connections: Not on file  Intimate Partner Violence: Not on file   Review of Systems No fever No dysuria or hematuria Some issues with breathing---due to pollen    Objective:   Physical Exam Constitutional:      Appearance: Normal appearance.  Cardiovascular:     Rate and Rhythm: Normal rate and regular rhythm.     Heart sounds: No murmur heard.    No gallop.  Pulmonary:     Effort: Pulmonary effort is normal.     Breath sounds: Normal breath sounds. No wheezing or rales.  Abdominal:     Palpations: Abdomen is soft.     Tenderness: There is no abdominal tenderness.  Musculoskeletal:     Cervical back: Neck supple.     Right lower leg: No edema.     Left lower leg: No edema.  Lymphadenopathy:     Cervical: No cervical adenopathy.  Neurological:     Mental Status: She is alert.  Psychiatric:        Mood and Affect: Mood normal.        Behavior: Behavior normal.            Assessment & Plan:

## 2023-01-25 NOTE — Assessment & Plan Note (Signed)
No true psychosis No cognitive changes Only psychoactive med is alprazolam---and no change in her low doses Discussed possibility of auditory changes---is going to audiologist Not delirium---discussed I don't think this is occult UTI Will check labs though

## 2023-01-30 DIAGNOSIS — H903 Sensorineural hearing loss, bilateral: Secondary | ICD-10-CM | POA: Diagnosis not present

## 2023-01-30 DIAGNOSIS — H6123 Impacted cerumen, bilateral: Secondary | ICD-10-CM | POA: Diagnosis not present

## 2023-01-31 ENCOUNTER — Ambulatory Visit (INDEPENDENT_AMBULATORY_CARE_PROVIDER_SITE_OTHER): Payer: Medicare PPO | Admitting: Internal Medicine

## 2023-01-31 ENCOUNTER — Encounter: Payer: Self-pay | Admitting: Internal Medicine

## 2023-01-31 VITALS — BP 130/70 | HR 74 | Temp 96.5°F | Ht 61.0 in | Wt 200.0 lb

## 2023-01-31 DIAGNOSIS — J45901 Unspecified asthma with (acute) exacerbation: Secondary | ICD-10-CM | POA: Insufficient documentation

## 2023-01-31 DIAGNOSIS — J4521 Mild intermittent asthma with (acute) exacerbation: Secondary | ICD-10-CM | POA: Diagnosis not present

## 2023-01-31 MED ORDER — ALBUTEROL SULFATE HFA 108 (90 BASE) MCG/ACT IN AERS: 2.0000 | INHALATION_SPRAY | Freq: Four times a day (QID) | RESPIRATORY_TRACT | 1 refills | Status: AC | PRN

## 2023-01-31 MED ORDER — PREDNISONE 20 MG PO TABS: 40.0000 mg | ORAL_TABLET | Freq: Every day | ORAL | 0 refills | Status: DC

## 2023-01-31 MED ORDER — BUDESONIDE 90 MCG/ACT IN AEPB: 1.0000 | INHALATION_SPRAY | Freq: Two times a day (BID) | RESPIRATORY_TRACT | 11 refills | Status: DC

## 2023-01-31 NOTE — Assessment & Plan Note (Signed)
Clearly pollen related Will add budesonide inhaler 90 mcg 1-2 daily for spring/fall and with respiratory infections Prednisone 40 x 3 then 20 x 3

## 2023-01-31 NOTE — Progress Notes (Signed)
Subjective:    Patient ID: [name], female    DOB: [birth year], 89 y.o.   MRN: [id number]  HPI Here due to worsening of her asthma  Having some SOB and wheezing Thinks green tea is helping Continues on montelukast and cetirizine 10 daily Has been using the albuterol frequently--but still has loud wheezing  No fever Not sick Some cough--from tightness  Current Outpatient Medications on File Prior to Visit  Medication Sig Dispense Refill   ACCU-CHEK SMARTVIEW test strip   3   acetaminophen (TYLENOL) 500 MG tablet Take 500 mg by mouth every 4 (four) hours as needed.     albuterol (VENTOLIN HFA) 108 (90 Base) MCG/ACT inhaler Inhale 2 puffs into the lungs every 6 (six) hours as needed. 18 g 1   ALOE PO Take 1 capsule by mouth daily.     ALPRAZolam (XANAX) 0.25 MG tablet TAKE 1 TABLET(0.25 MG) BY MOUTH TWICE DAILY AS NEEDED FOR ANXIETY 60 tablet 0   amLODipine (NORVASC) 5 MG tablet Take 1-2 tablets (5-10 mg total) by mouth as directed. Take 2 tablets once daily if systolic blood pressure (top blood pressure reading) remains over 130. If it drops back down then go back to 5 mg once daily. 90 tablet 1   aspirin 81 MG tablet Take 81 mg by mouth daily.     atorvastatin (LIPITOR) 20 MG tablet TAKE 1 TABLET BY MOUTH EVERY DAY 90 tablet 3   B-D ULTRA-FINE 33 LANCETS MISC Use to test blood sugar twice daily dx: E11.9 200 each 3   calcitonin, salmon, (MIACALCIN/FORTICAL) 200 UNIT/ACT nasal spray USE 1 SPRAY NASALLY(ALTERNATING NOSTRILS) EVERY DAY 3.7 mL 11   cetirizine (ZYRTEC) 5 MG tablet Take 5 mg by mouth daily.     COLLAGEN PO Take by mouth daily.     CORAL CALCIUM PO Take by mouth daily.     CRANBERRY PO Take by mouth daily.     CYANOCOBALAMIN PO Take by mouth daily.     glucosamine-chondroitin 500-400 MG tablet Take 1 tablet by mouth daily.     insulin glargine (LANTUS) 100 UNIT/ML Solostar Pen 36 Units at bedtime.     Insulin Pen Needle (B-D UF III MINI PEN NEEDLES) 31G X 5 MM  MISC USE 1 TO 2 TIMES DAILY 200 each 4   KRILL OIL PO Take by mouth.     levothyroxine (SYNTHROID, LEVOTHROID) 100 MCG tablet Take 100 mcg by mouth daily before breakfast.     MAGNESIUM PO Take 1 tablet by mouth in the morning and at bedtime.     meclizine (ANTIVERT) 25 MG tablet Take 1 tablet (25 mg total) by mouth 3 (three) times daily as needed. 90 tablet 0   metoprolol succinate (TOPROL-XL) 50 MG 24 hr tablet TAKE 1 TABLET(50 MG) BY MOUTH DAILY 90 tablet 2   montelukast (SINGULAIR) 10 MG tablet Take 1 tablet (10 mg total) by mouth at bedtime. 90 tablet 3   Multiple Vitamin (MULTIVITAMIN) tablet Take 1 tablet by mouth daily. Centrum Silver     Multiple Vitamins-Minerals (OCUVITE PO) Take 1 tablet by mouth daily.     NOVOLOG FLEXPEN 100 UNIT/ML FlexPen 16 units 1st meal 22 units 2nd meal     omeprazole (PRILOSEC) 20 MG capsule Take 1 capsule (20 mg total) by mouth daily. 90 capsule 3   Spacer/Aero-Holding Chambers (VALVED HOLDING CHAMBER) DEVI      TURMERIC PO Take 400 mg by mouth daily.     vitamin C (  ASCORBIC ACID) 500 MG tablet Take 500 mg by mouth daily.     VITAMIN D PO Take by mouth daily.     No current facility-administered medications on file prior to visit.    Allergies  Allergen Reactions   Gabapentin Swelling   Cefdinir     REACTION: unspecified   Ciprofloxacin     rash   Clarithromycin     REACTION: weird dreams (on prednisone at the same time but has tolerated this in the past)   Diclofenac Dermatitis    Topical only  Causes burning to site   Miglitol     REACTION: unspecified   Niacin     REACTION: unspecified   Qvar [Beclomethasone] Other (See Comments)    Feels funny Feels funny   Jardiance [Empagliflozin] Other (See Comments)    Yeast infection    Metformin And Related Diarrhea    Past Medical History:  Diagnosis Date   Allergy    Asthma    Diabetes mellitus    Diastolic dysfunction    a. 06/2019 Echo: EF 60-65%, no rwma, GR1 DD. Mild MR. Nl RV fxn.    Essential hypertension    GERD (gastroesophageal reflux disease)    Hyperlipidemia    Mixed hyperlipidemia    Osteopenia    PAF (paroxysmal atrial fibrillation) ([facility])    a. Dx in late 1990's - no known recurrence.   Sleep disorder    Thyroid disease     Past Surgical History:  Procedure Laterality Date   CARPAL TUNNEL RELEASE Right    CERVICAL CONE BIOPSY  07/16/1959   CHOLECYSTECTOMY     NASAL POLYP SURGERY  11/15/1999   STRABISMUS SURGERY  1946 / 1967   VAGINAL DELIVERY     x2    Family History  Problem Relation Age of Onset   Asthma Mother    Coronary artery disease Father     Social History   Socioeconomic History   Marital status: Widowed    Spouse name: Not on file   Number of children: 2   Years of education: Not on file   Highest education level: Not on file  Occupational History    Employer: RETIRED  Tobacco Use   Smoking status: Never    Passive exposure: Never   Smokeless tobacco: Never  Vaping Use   Vaping Use: Never used  Substance and Sexual Activity   Alcohol use: Not Currently    Alcohol/week: 1.0 standard drink of alcohol    Types: 1 Glasses of wine per week    Comment: 1 glass of wine-rare   Drug use: No   Sexual activity: Not Currently  Other Topics Concern   Not on file  Social History Narrative   Widowed 3/23      Has living will   Not sure about formal POA --but requests daughter [name] (and son Fara Olden) to be health care POAs   Would accept resuscitation attempts   no feeding tubes if cognitively unaware   Social Determinants of Health   Financial Resource Strain: Not on file  Food Insecurity: Not on file  Transportation Needs: Not on file  Physical Activity: Not on file  Stress: Not on file  Social Connections: Not on file  Intimate Partner Violence: Not on file   Review of Systems Did see Dr Tami Ribas yesterday---no answers for the voices. Not often though    Objective:   Physical Exam Constitutional:       Appearance: Normal appearance.  Cardiovascular:  Rate and Rhythm: Normal rate and regular rhythm.     Heart sounds: No murmur heard.    No gallop.  Pulmonary:     Effort: Pulmonary effort is normal.     Breath sounds: No rales.     Comments: Mild exp wheezing and expiratory phase prolongation Musculoskeletal:     Cervical back: Neck supple.     Right lower leg: No edema.     Left lower leg: No edema.  Lymphadenopathy:     Cervical: No cervical adenopathy.  Neurological:     Mental Status: She is alert.            Assessment & Plan:

## 2023-02-01 ENCOUNTER — Telehealth: Payer: Self-pay

## 2023-02-01 NOTE — Telephone Encounter (Signed)
Received a fax from the pharmacy stating Budesonide is not covered but Arnuity Ellipta is covered.

## 2023-02-02 MED ORDER — ARNUITY ELLIPTA 100 MCG/ACT IN AEPB: 1.0000 | INHALATION_SPRAY | Freq: Every day | RESPIRATORY_TRACT | 11 refills | Status: DC

## 2023-02-02 NOTE — Telephone Encounter (Signed)
Spoke to pt's daughter. 

## 2023-02-02 NOTE — Addendum Note (Signed)
Addended by: Viviana Simpler I on: 02/02/2023 08:02 AM   Modules accepted: Orders

## 2023-02-04 DIAGNOSIS — E785 Hyperlipidemia, unspecified: Secondary | ICD-10-CM | POA: Diagnosis not present

## 2023-02-04 DIAGNOSIS — E119 Type 2 diabetes mellitus without complications: Secondary | ICD-10-CM | POA: Diagnosis not present

## 2023-02-04 DIAGNOSIS — I4510 Unspecified right bundle-branch block: Secondary | ICD-10-CM | POA: Diagnosis not present

## 2023-02-04 DIAGNOSIS — S0003XA Contusion of scalp, initial encounter: Secondary | ICD-10-CM | POA: Diagnosis not present

## 2023-02-04 DIAGNOSIS — N3000 Acute cystitis without hematuria: Secondary | ICD-10-CM | POA: Diagnosis not present

## 2023-02-04 DIAGNOSIS — I10 Essential (primary) hypertension: Secondary | ICD-10-CM | POA: Diagnosis not present

## 2023-02-04 DIAGNOSIS — N390 Urinary tract infection, site not specified: Secondary | ICD-10-CM | POA: Diagnosis not present

## 2023-02-04 DIAGNOSIS — J45909 Unspecified asthma, uncomplicated: Secondary | ICD-10-CM | POA: Diagnosis not present

## 2023-02-04 DIAGNOSIS — M47812 Spondylosis without myelopathy or radiculopathy, cervical region: Secondary | ICD-10-CM | POA: Diagnosis not present

## 2023-02-04 DIAGNOSIS — S0990XA Unspecified injury of head, initial encounter: Secondary | ICD-10-CM | POA: Diagnosis not present

## 2023-02-05 NOTE — Progress Notes (Unsigned)
Cardiology Office Note  Date:  02/06/2023   ID:  Harbor, Hembree [birth year], MRN [id number]  PCP:  Venia Carbon, MD   Chief Complaint  Patient presents with   6 month follow up     Patient was at [facility] ER with a fall yesterday, she hit her head and is doing well today. Medications reviewed by the patient verbally.     HPI:  [name] is a 89 y.o. female with a hx of paroxysmal atrial fibrillation. diagnosed 23 years ago, age 64 In hospital ,lasted 1 week,  none since then ("heart was feeling really weird") chadsvasc 4, on asa, does not want NOAC HTN Hyperlipidemia DM II, followed by Dr. Eddie Dibbles,  Who presents for routine follow-up of her palpitations/atrial fibrillation  Last seen in clinic by myself November 2023 Seen in the ER at [facility] yesterday after a fall, records reviewed slipping on a puddle of water in her bathroom and sustaining a mechanical fall onto her knees with positive anterior head strike against the ground.  Bruise left forehead  Lives with daughter since husband died Recent issues with asthma, On predisone 6 day taper, Cough, slowly improving Trace leg swelling, On amlodipine 5  does not need amlodipine 10 mg as blood pressure well-controlled Denies any tachypalpitations concerning for arrhythmia Uses walker for balance  lost her husband last year, aspiration PNA, sepsis Passed in his sleep  Lab work reviewed WBC: 11.3 A1C 8.5  In hospital 5/23, diarrhea from high dose metformin  Followed by endocrine A1C 8.5, on insulin  Carpel tunnel pain bilaterally, had surgery on 1 side  EKG personally reviewed by myself on todays visit Shows normal sinus rhythm with rate 58 bpm no significant ST or T wave changes  Other past medical history reviewed Prior dizzy episodes 2020 CT and MRI head Severe stenosis of right posterior artery Diffuse atherosclerosis.  On aspirin  Echo 06/2016: Normal EF >55%, normal LA size Event monitor 06/2016: NSR, no  atrial fib  episode of chest pain, in the ED 07/2016  CT chest  2015: Atherosclerotic calcification aorta and minimally in coronary arteries.   PMH:   has a past medical history of Allergy, Asthma, Diabetes mellitus, Diastolic dysfunction, Essential hypertension, GERD (gastroesophageal reflux disease), Hyperlipidemia, Mixed hyperlipidemia, Osteopenia, PAF (paroxysmal atrial fibrillation) ([place]), Sleep disorder, and Thyroid disease.  PSH:    Past Surgical History:  Procedure Laterality Date   CARPAL TUNNEL RELEASE Right    CERVICAL CONE BIOPSY  07/16/1959   CHOLECYSTECTOMY     NASAL POLYP SURGERY  11/15/1999   STRABISMUS SURGERY  1946 / 1967   VAGINAL DELIVERY     x2    Current Outpatient Medications  Medication Sig Dispense Refill   ACCU-CHEK SMARTVIEW test strip   3   acetaminophen (TYLENOL) 500 MG tablet Take 500 mg by mouth every 4 (four) hours as needed.     albuterol (VENTOLIN HFA) 108 (90 Base) MCG/ACT inhaler Inhale 2 puffs into the lungs every 6 (six) hours as needed. 18 g 1   ALOE PO Take 1 capsule by mouth daily.     ALPRAZolam (XANAX) 0.25 MG tablet TAKE 1 TABLET(0.25 MG) BY MOUTH TWICE DAILY AS NEEDED FOR ANXIETY 60 tablet 0   amLODipine (NORVASC) 5 MG tablet Take 1-2 tablets (5-10 mg total) by mouth as directed. Take 2 tablets once daily if systolic blood pressure (top blood pressure reading) remains over 130. If it drops back down then go back to 5  mg once daily. 90 tablet 1   aspirin 81 MG tablet Take 81 mg by mouth daily.     atorvastatin (LIPITOR) 20 MG tablet TAKE 1 TABLET BY MOUTH EVERY DAY 90 tablet 3   B-D ULTRA-FINE 33 LANCETS MISC Use to test blood sugar twice daily dx: E11.9 200 each 3   calcitonin, salmon, (MIACALCIN/FORTICAL) 200 UNIT/ACT nasal spray USE 1 SPRAY NASALLY(ALTERNATING NOSTRILS) EVERY DAY 3.7 mL 11   cetirizine (ZYRTEC) 5 MG tablet Take 5 mg by mouth daily.     COLLAGEN PO Take by mouth daily.     CORAL CALCIUM PO Take by mouth daily.      CRANBERRY PO Take by mouth daily.     CYANOCOBALAMIN PO Take by mouth daily.     Fluticasone Furoate (ARNUITY ELLIPTA) 100 MCG/ACT AEPB Inhale 1 Inhalation into the lungs daily. 30 each 11   glucosamine-chondroitin 500-400 MG tablet Take 1 tablet by mouth daily.     insulin glargine (LANTUS) 100 UNIT/ML Solostar Pen 36 Units at bedtime.     Insulin Pen Needle (B-D UF III MINI PEN NEEDLES) 31G X 5 MM MISC USE 1 TO 2 TIMES DAILY 200 each 4   KRILL OIL PO Take by mouth.     levothyroxine (SYNTHROID, LEVOTHROID) 100 MCG tablet Take 100 mcg by mouth daily before breakfast.     MAGNESIUM PO Take 1 tablet by mouth in the morning and at bedtime.     meclizine (ANTIVERT) 25 MG tablet Take 1 tablet (25 mg total) by mouth 3 (three) times daily as needed. 90 tablet 0   metoprolol succinate (TOPROL-XL) 50 MG 24 hr tablet TAKE 1 TABLET(50 MG) BY MOUTH DAILY 90 tablet 2   montelukast (SINGULAIR) 10 MG tablet Take 1 tablet (10 mg total) by mouth at bedtime. 90 tablet 3   Multiple Vitamin (MULTIVITAMIN) tablet Take 1 tablet by mouth daily. Centrum Silver     Multiple Vitamins-Minerals (OCUVITE PO) Take 1 tablet by mouth daily.     nitrofurantoin, macrocrystal-monohydrate, (MACROBID) 100 MG capsule Take 100 mg by mouth 2 (two) times daily.     NOVOLOG FLEXPEN 100 UNIT/ML FlexPen 16 units 1st meal 22 units 2nd meal     omeprazole (PRILOSEC) 20 MG capsule Take 1 capsule (20 mg total) by mouth daily. 90 capsule 3   predniSONE (DELTASONE) 20 MG tablet Take 2 tablets (40 mg total) by mouth daily. For 3 days, then 1 tab daily for 3 days 9 tablet 0   Spacer/Aero-Holding Chambers (VALVED HOLDING CHAMBER) DEVI      TURMERIC PO Take 400 mg by mouth daily.     vitamin C (ASCORBIC ACID) 500 MG tablet Take 500 mg by mouth daily.     VITAMIN D PO Take by mouth daily.     No current facility-administered medications for this visit.    Allergies:   Gabapentin, Cefdinir, Ciprofloxacin, Clarithromycin, Diclofenac, Miglitol,  Niacin, Qvar [beclomethasone], Jardiance [empagliflozin], and Metformin and related   Social History:  The patient  reports that she has never smoked. She has never been exposed to tobacco smoke. She has never used smokeless tobacco. She reports that she does not currently use alcohol after a past usage of about 1.0 standard drink of alcohol per week. She reports that she does not use drugs.   Family History:   family history includes Asthma in her mother; Coronary artery disease in her father.    Review of Systems: Review of Systems  Constitutional: Negative.  HENT: Negative.    Respiratory: Negative.    Cardiovascular: Negative.   Gastrointestinal: Negative.   Musculoskeletal: Negative.   Neurological: Negative.   Psychiatric/Behavioral: Negative.    All other systems reviewed and are negative.   PHYSICAL EXAM: VS:  BP 112/62 (BP Location: Left Arm, Patient Position: Sitting, Cuff Size: Large)   Pulse (!) 58   Ht 5\' 2"  (1.575 m)   Wt 198 lb 12.8 oz (90.2 kg)   SpO2 96%   BMI 36.36 kg/m  , BMI Body mass index is 36.36 kg/m. Constitutional:  oriented to person, place, and time. No distress.  HENT:  Head: Grossly normal Eyes:  no discharge. No scleral icterus.  Neck: No JVD, no carotid bruits  Cardiovascular: Regular rate and rhythm, no murmurs appreciated Pulmonary/Chest: Clear to auscultation bilaterally, no wheezes or rails Abdominal: Soft.  no distension.  no tenderness.  Musculoskeletal: Normal range of motion Neurological:  normal muscle tone. Coordination normal. No atrophy Skin: Skin warm and dry Psychiatric: normal affect, pleasant  Recent Labs: 01/25/2023: ALT 15; BUN 21; Creatinine, Ser 0.78; Hemoglobin 13.4; Platelets 253.0; Potassium 4.9; Sodium 136; TSH 4.43    Lipid Panel Lab Results  Component Value Date   CHOL 88 05/02/2022   HDL 48.20 05/02/2022   LDLCALC 7 05/02/2022   TRIG 164.0 (H) 05/02/2022     Wt Readings from Last 3 Encounters:   02/06/23 198 lb 12.8 oz (90.2 kg)  01/31/23 200 lb (90.7 kg)  01/25/23 200 lb (90.7 kg)     ASSESSMENT AND PLAN:  Paroxysmal atrial fibrillation (HCC) Maintaining normal sinus rhythm, no recent atrial fibrillation On asa, previously declined NOAC  Essential hypertension Continue amlodipine 5 daily, blood pressure well-controlled For any worsening leg swelling will substitute amlodipine for different medication  Mixed hyperlipidemia Cholesterol is at goal on the current lipid regimen. No changes to the medications were made.  Cerebrovascular disease On aspirin, cholesterol at goal  Aortic atherosclerosis (HCC) Seen on CT scan Cholesterol at goal  Diabetes: HGBA1C >8, managed by endocrine Moderate diet, on insulin Recommend she start a walking program for weight loss  Shortness of breath Mild significant symptoms, deconditioned Recommended walking program  Stress/adjustment disorder Doing much better after loss of husband, lives with daughter   Total encounter time more than 30 minutes  Greater than 50% was spent in counseling and coordination of care with the patient     No orders of the defined types were placed in this encounter.    Signed, Esmond Plants, M.D., Ph.D. 02/06/2023  [facility], [place]

## 2023-02-06 ENCOUNTER — Ambulatory Visit: Payer: Medicare PPO | Attending: Cardiovascular Disease | Admitting: Cardiovascular Disease

## 2023-02-06 ENCOUNTER — Encounter: Payer: Self-pay | Admitting: Cardiovascular Disease

## 2023-02-06 VITALS — BP 112/62 | HR 58 | Ht 62.0 in | Wt 198.8 lb

## 2023-02-06 DIAGNOSIS — I480 Paroxysmal atrial fibrillation: Secondary | ICD-10-CM | POA: Diagnosis not present

## 2023-02-06 DIAGNOSIS — E1169 Type 2 diabetes mellitus with other specified complication: Secondary | ICD-10-CM | POA: Diagnosis not present

## 2023-02-06 DIAGNOSIS — I10 Essential (primary) hypertension: Secondary | ICD-10-CM | POA: Diagnosis not present

## 2023-02-06 DIAGNOSIS — I700 Atherosclerosis of aorta: Secondary | ICD-10-CM

## 2023-02-06 DIAGNOSIS — E782 Mixed hyperlipidemia: Secondary | ICD-10-CM

## 2023-02-06 MED ORDER — METOPROLOL SUCCINATE ER 50 MG PO TB24: ORAL_TABLET | ORAL | 3 refills | Status: DC

## 2023-02-06 NOTE — Patient Instructions (Signed)
Medication Instructions:  No changes  If you need a refill on your cardiac medications before your next appointment, please call your pharmacy.   Lab work: No new labs needed  Testing/Procedures: No new testing needed  Follow-Up: At [facility], you and your health needs are our priority.  As part of our continuing mission to provide you with exceptional heart care, we have created designated Provider Care Teams.  These Care Teams include your primary Cardiologist (physician) and Advanced Practice Providers (APPs -  Physician Assistants and Nurse Practitioners) who all work together to provide you with the care you need, when you need it.  You will need a follow up appointment in 12 months  Providers on your designated Care Team:   Christopher Berge, NP Ryan Dunn, PA-C Cadence Furth, PA-C  COVID-19 Vaccine Information can be found at: [email or web address] For questions related to vaccine distribution or appointments, please email [email or web address] or call [phone].   

## 2023-02-10 ENCOUNTER — Other Ambulatory Visit (HOSPITAL_COMMUNITY): Payer: Self-pay

## 2023-02-10 ENCOUNTER — Other Ambulatory Visit: Payer: Self-pay | Admitting: Internal Medicine

## 2023-02-21 ENCOUNTER — Other Ambulatory Visit: Payer: Self-pay | Admitting: Internal Medicine

## 2023-02-21 NOTE — Telephone Encounter (Signed)
Last filled 01-23-23 #60 Last OV 01-31-23-24 Acute Next OV 09-18-23 [id number] - [place], [place] - [address] OF [address]

## 2023-03-06 DIAGNOSIS — E113293 Type 2 diabetes mellitus with mild nonproliferative diabetic retinopathy without macular edema, bilateral: Secondary | ICD-10-CM | POA: Diagnosis not present

## 2023-03-06 DIAGNOSIS — Z794 Long term (current) use of insulin: Secondary | ICD-10-CM | POA: Diagnosis not present

## 2023-03-06 DIAGNOSIS — E063 Autoimmune thyroiditis: Secondary | ICD-10-CM | POA: Diagnosis not present

## 2023-03-06 DIAGNOSIS — E1165 Type 2 diabetes mellitus with hyperglycemia: Secondary | ICD-10-CM | POA: Diagnosis not present

## 2023-03-23 ENCOUNTER — Telehealth: Payer: Self-pay | Admitting: Pharmacist

## 2023-03-23 DIAGNOSIS — F39 Unspecified mood [affective] disorder: Secondary | ICD-10-CM

## 2023-03-23 DIAGNOSIS — J4520 Mild intermittent asthma, uncomplicated: Secondary | ICD-10-CM

## 2023-03-23 DIAGNOSIS — Z794 Long term (current) use of insulin: Secondary | ICD-10-CM

## 2023-03-23 NOTE — Telephone Encounter (Signed)
PharmD reviewed patient chart to assess eligibility for [facility] services. Patient was determined to be a good candidate for the program given the complexity of the medication regimen and overall risk for hospitalization and/or high healthcare utilization.   Referral entered in order to outreach patient and offer appointment with PharmD. Referral cosigned to PCP.  

## 2023-03-24 ENCOUNTER — Other Ambulatory Visit: Payer: Self-pay | Admitting: Internal Medicine

## 2023-04-03 DIAGNOSIS — H35379 Puckering of macula, unspecified eye: Secondary | ICD-10-CM | POA: Diagnosis not present

## 2023-04-03 DIAGNOSIS — Z0100 Encounter for examination of eyes and vision without abnormal findings: Secondary | ICD-10-CM | POA: Diagnosis not present

## 2023-04-03 DIAGNOSIS — E113393 Type 2 diabetes mellitus with moderate nonproliferative diabetic retinopathy without macular edema, bilateral: Secondary | ICD-10-CM | POA: Diagnosis not present

## 2023-04-03 LAB — HM DIABETES EYE EXAM

## 2023-04-06 ENCOUNTER — Encounter: Payer: Medicare PPO | Admitting: Pharmacist

## 2023-04-06 ENCOUNTER — Ambulatory Visit: Payer: Medicare PPO | Admitting: Internal Medicine

## 2023-04-09 ENCOUNTER — Other Ambulatory Visit: Payer: Self-pay | Admitting: Physician Assistant

## 2023-04-11 ENCOUNTER — Other Ambulatory Visit: Payer: Self-pay | Admitting: Internal Medicine

## 2023-04-12 ENCOUNTER — Telehealth: Payer: Self-pay

## 2023-04-12 NOTE — Progress Notes (Signed)
Care Management & Coordination Services Pharmacy Team  Reason for Encounter: Appointment Reminder  Contacted patient to confirm in office appointment with Al Corpus , PharmD on 04/17/23 at 11:00. Spoke with family on 04/12/2023 Spoke with daughter [name] and appointment rescheduled for 05/01/23 at 2:00. Aware of location [facility] .  Do you have any problems getting your medications? No  What is your top health concern you would like to discuss at your upcoming visit? DM  Have you seen any other providers since your last visit with PCP? Yes  Endocrinology,cardiology  Chart review:  Recent office visits:  01/31/23-Richard Letvak,MD(PCP)- asthma,add budesonide inhaler 90 mcg-1-2 puffs  for spring/fall  flare ups/infections, start prednisone taper dose. 01/25/23-Richard Letvak,MD(PCP)-hallucinations,labs(normal , other than sugar) 01/04/23-Richard Letvak,MD(PCP)-acute respiratory,start augmentin 875 BID 1 week,hold off on prednisone  Recent consult visits:  03/06/23-Anna Solum,MD(endo)  A1c 7.8-f/u DM,on insulin only,encouraged weight loss and follow diet,no medication changes, f/u 3 months 02/06/23- Timothy Gollan,MD(cardio)- f/u Afib,EKG,continues aspirin only,HGBA1C >8, managed by endocrine ,recommend walking program f/u 12 months 11/21/22-Anna Solum,MD(endo)-adjust lantus to 36 units daily,continue novolog 16 before lunch,22 before supper.f/u 3 months  Hospital visits:  04/20/22-[facility] ED - fall, no fractures,UTI- antibiotic, f/u PCP, no admission  10/20/22-[facility] ED- fall, no fractures, UTI-antibiotics- no admission   Star Rating Drugs:  Medication:  Last Fill: Day Supply Atorvastatin 20mg  03/09/23 90 Lantus   02/22/23 86 Novolog  02/22/23 85  Care Gaps: Annual wellness visit in last year? Yes  If Diabetic: Last eye exam / retinopathy screening:UTD Last diabetic foot exam:UTD   Al Corpus, PharmD notified  Burt Knack, [facility]  Assistant [phone]

## 2023-04-17 ENCOUNTER — Encounter: Payer: Medicare PPO | Admitting: Pharmacist

## 2023-04-27 ENCOUNTER — Ambulatory Visit (INDEPENDENT_AMBULATORY_CARE_PROVIDER_SITE_OTHER): Payer: Medicare PPO | Admitting: Internal Medicine

## 2023-04-27 ENCOUNTER — Encounter: Payer: Self-pay | Admitting: Internal Medicine

## 2023-04-27 VITALS — BP 134/84 | HR 66 | Temp 97.4°F | Ht 62.0 in | Wt 205.0 lb

## 2023-04-27 DIAGNOSIS — B0229 Other postherpetic nervous system involvement: Secondary | ICD-10-CM

## 2023-04-27 DIAGNOSIS — E083293 Diabetes mellitus due to underlying condition with mild nonproliferative diabetic retinopathy without macular edema, bilateral: Secondary | ICD-10-CM

## 2023-04-27 DIAGNOSIS — Z794 Long term (current) use of insulin: Secondary | ICD-10-CM | POA: Diagnosis not present

## 2023-04-27 DIAGNOSIS — L821 Other seborrheic keratosis: Secondary | ICD-10-CM

## 2023-04-27 NOTE — Progress Notes (Signed)
Subjective:    Patient ID: [name], female    DOB: 03-07-1931, 89 y.o.   MRN: [id number]  HPI Here with daughter due to concern with some moles on bac  Having itching on back Wondered if it could be residual of shingles---that was ~3-4 years ago Has a lot of moles--unclear how long they have been there Tried to set up with derm---but needs to reestablish and couldn't get in soon  Uses shower gel or Dove  Current Outpatient Medications on File Prior to Visit  Medication Sig Dispense Refill   ACCU-CHEK SMARTVIEW test strip   3   acetaminophen (TYLENOL) 500 MG tablet Take 500 mg by mouth every 4 (four) hours as needed.     albuterol (VENTOLIN HFA) 108 (90 Base) MCG/ACT inhaler Inhale 2 puffs into the lungs every 6 (six) hours as needed. 18 g 1   ALOE PO Take 1 capsule by mouth daily.     ALPRAZolam (XANAX) 0.25 MG tablet TAKE 1 TABLET(0.25 MG) BY MOUTH TWICE DAILY AS NEEDED FOR ANXIETY 180 tablet 0   amLODipine (NORVASC) 5 MG tablet TAKE 1 TABLET(5 MG) BY MOUTH DAILY 90 tablet 3   aspirin 81 MG tablet Take 81 mg by mouth daily.     atorvastatin (LIPITOR) 20 MG tablet TAKE 1 TABLET BY MOUTH EVERY DAY 90 tablet 3   B-D ULTRA-FINE 33 LANCETS MISC Use to test blood sugar twice daily dx: E11.9 200 each 3   calcitonin, salmon, (MIACALCIN/FORTICAL) 200 UNIT/ACT nasal spray USE 1 SPRAY NASALLY(ALTERNATING NOSTRILS) EVERY DAY 3.7 mL 11   cetirizine (ZYRTEC) 5 MG tablet Take 5 mg by mouth daily.     COLLAGEN PO Take by mouth daily.     CORAL CALCIUM PO Take by mouth daily.     CRANBERRY PO Take by mouth daily.     CYANOCOBALAMIN PO Take by mouth daily.     Fluticasone Furoate (ARNUITY ELLIPTA) 100 MCG/ACT AEPB Inhale 1 Inhalation into the lungs daily. 30 each 11   glucosamine-chondroitin 500-400 MG tablet Take 1 tablet by mouth daily.     insulin glargine (LANTUS) 100 UNIT/ML Solostar Pen Inject 40 Units into the skin at bedtime.     Insulin Pen Needle (B-D UF III MINI PEN NEEDLES) 31G  X 5 MM MISC USE 1 TO 2 TIMES DAILY 200 each 4   KRILL OIL PO Take by mouth.     levothyroxine (SYNTHROID, LEVOTHROID) 100 MCG tablet Take 100 mcg by mouth daily before breakfast.     MAGNESIUM PO Take 1 tablet by mouth in the morning and at bedtime.     meclizine (ANTIVERT) 25 MG tablet Take 1 tablet (25 mg total) by mouth 3 (three) times daily as needed. 90 tablet 0   metoprolol succinate (TOPROL-XL) 50 MG 24 hr tablet TAKE 1 TABLET(50 MG) BY MOUTH DAILY 90 tablet 3   montelukast (SINGULAIR) 10 MG tablet TAKE 1 TABLET(10 MG) BY MOUTH AT BEDTIME 90 tablet 3   Multiple Vitamin (MULTIVITAMIN) tablet Take 1 tablet by mouth daily. Centrum Silver     Multiple Vitamins-Minerals (OCUVITE PO) Take 1 tablet by mouth daily.     NOVOLOG FLEXPEN 100 UNIT/ML FlexPen 16 units 1st meal 22 units 2nd meal     omeprazole (PRILOSEC) 20 MG capsule Take 1 capsule (20 mg total) by mouth daily. 90 capsule 3   Spacer/Aero-Holding Chambers (VALVED HOLDING CHAMBER) DEVI      TURMERIC PO Take 400 mg by mouth daily.  vitamin C (ASCORBIC ACID) 500 MG tablet Take 500 mg by mouth daily.     VITAMIN D PO Take by mouth daily.     No current facility-administered medications on file prior to visit.    Allergies  Allergen Reactions   Gabapentin Swelling   Cefdinir     REACTION: unspecified   Ciprofloxacin     rash   Clarithromycin     REACTION: weird dreams (on prednisone at the same time but has tolerated this in the past)   Diclofenac Dermatitis    Topical only  Causes burning to site   Miglitol     REACTION: unspecified   Niacin     REACTION: unspecified   Qvar [Beclomethasone] Other (See Comments)    Feels funny Feels funny   Jardiance [Empagliflozin] Other (See Comments)    Yeast infection    Metformin And Related Diarrhea    Past Medical History:  Diagnosis Date   Allergy    Asthma    Diabetes mellitus    Diastolic dysfunction    a. 06/2019 Echo: EF 60-65%, no rwma, GR1 DD. Mild MR. Nl RV fxn.    Essential hypertension    GERD (gastroesophageal reflux disease)    Hyperlipidemia    Mixed hyperlipidemia    Osteopenia    PAF (paroxysmal atrial fibrillation) (HCC)    a. Dx in late 1990's - no known recurrence.   Sleep disorder    Thyroid disease     Past Surgical History:  Procedure Laterality Date   CARPAL TUNNEL RELEASE Right    CERVICAL CONE BIOPSY  07/16/1959   CHOLECYSTECTOMY     NASAL POLYP SURGERY  11/15/1999   STRABISMUS SURGERY  1946 / 1967   VAGINAL DELIVERY     x2    Family History  Problem Relation Age of Onset   Asthma Mother    Coronary artery disease Father     Social History   Socioeconomic History   Marital status: Widowed    Spouse name: Not on file   Number of children: 2   Years of education: Not on file   Highest education level: Not on file  Occupational History    Employer: RETIRED  Tobacco Use   Smoking status: Never    Passive exposure: Never   Smokeless tobacco: Never  Vaping Use   Vaping Use: Never used  Substance and Sexual Activity   Alcohol use: Not Currently    Alcohol/week: 1.0 standard drink of alcohol    Types: 1 Glasses of wine per week    Comment: 1 glass of wine-rare   Drug use: No   Sexual activity: Not Currently  Other Topics Concern   Not on file  Social History Narrative   Widowed 3/23      Has living will   Not sure about formal POA --but requests daughter [name] (and son [name]) to be health care POAs   Would accept resuscitation attempts   no feeding tubes if cognitively unaware   Social Determinants of Health   Financial Resource Strain: Not on file  Food Insecurity: Not on file  Transportation Needs: Not on file  Physical Activity: Not on file  Stress: Not on file  Social Connections: Not on file  Intimate Partner Violence: Not on file   Review of Systems Same laundry detergent     Objective:   Physical Exam Skin:    Comments: Multiple seborrheic keratoses scattered on back 1 skin tag on  right No worrisome  lesions Does have increase sensitivity right>left lower thoracic back  Neurological:     Mental Status: She is alert.            Assessment & Plan:

## 2023-04-27 NOTE — Assessment & Plan Note (Signed)
Does have increased sensation and itching Daughter shows picture from shingles years ago--and actually crosses several dermatomes on the right Can use moisturizers after showering Advised lidocaine gel or patch on the sensitive areas if they persist

## 2023-04-27 NOTE — Assessment & Plan Note (Signed)
Not excited about Dr Tedd Sias Discussed limited options with endocrinologists I can take over the diabetes care---she is okay with this

## 2023-04-27 NOTE — Assessment & Plan Note (Signed)
Reassured about this No action She will reestablish with dermatologist

## 2023-05-01 ENCOUNTER — Encounter: Payer: Medicare PPO | Admitting: Pharmacist

## 2023-05-02 DIAGNOSIS — H6123 Impacted cerumen, bilateral: Secondary | ICD-10-CM | POA: Diagnosis not present

## 2023-05-02 DIAGNOSIS — H903 Sensorineural hearing loss, bilateral: Secondary | ICD-10-CM | POA: Diagnosis not present

## 2023-05-29 ENCOUNTER — Encounter: Payer: Self-pay | Admitting: Family Medicine

## 2023-05-29 ENCOUNTER — Ambulatory Visit: Payer: Medicare PPO | Admitting: Family Medicine

## 2023-05-29 VITALS — BP 112/70 | HR 73 | Temp 98.3°F | Ht 62.0 in | Wt 208.8 lb

## 2023-05-29 DIAGNOSIS — R440 Auditory hallucinations: Secondary | ICD-10-CM

## 2023-05-29 DIAGNOSIS — R350 Frequency of micturition: Secondary | ICD-10-CM | POA: Insufficient documentation

## 2023-05-29 NOTE — Progress Notes (Signed)
Marikay Alar, MD Phone: [phone]  [name] is a 89 y.o. female who presents today for same day visit.   Auditory hallucinations/urine smell change: The patient's daughter notes these things have been going on for the last day or 2.  She has been hearing things that are not there.  She does have a history of auditory hallucinations though typically she hears her dead husband when this previously would occur.  Now she has been hearing other voices.  She has been more lethargic.  She has been peeing more frequently than usual.  Her daughter was concerned that all of this may be related to a UTI given that her father had hallucinations when he had UTIs before he passed away.  She notes no dysuria or urinary urgency.  No hematuria.  She has had some lower abdominal discomfort 1 or 2 times over the last couple of days.  No vaginal discharge.  No new medications.  The patient's daughter also reports in the past the patient has had issues with hallucinations when her sodium has been low.  Patient notes no excessive depression or anxiety.  Denies memory changes.  Social History   Tobacco Use  Smoking Status Never   Passive exposure: Never  Smokeless Tobacco Never    Current Outpatient Medications on File Prior to Visit  Medication Sig Dispense Refill   ACCU-CHEK SMARTVIEW test strip   3   acetaminophen (TYLENOL) 500 MG tablet Take 500 mg by mouth every 4 (four) hours as needed.     albuterol (VENTOLIN HFA) 108 (90 Base) MCG/ACT inhaler Inhale 2 puffs into the lungs every 6 (six) hours as needed. 18 g 1   ALOE PO Take 1 capsule by mouth daily.     ALPRAZolam (XANAX) 0.25 MG tablet TAKE 1 TABLET(0.25 MG) BY MOUTH TWICE DAILY AS NEEDED FOR ANXIETY 180 tablet 0   amLODipine (NORVASC) 5 MG tablet TAKE 1 TABLET(5 MG) BY MOUTH DAILY 90 tablet 3   aspirin 81 MG tablet Take 81 mg by mouth daily.     atorvastatin (LIPITOR) 20 MG tablet TAKE 1 TABLET BY MOUTH EVERY DAY 90 tablet 3   B-D  ULTRA-FINE 33 LANCETS MISC Use to test blood sugar twice daily dx: E11.9 200 each 3   calcitonin, salmon, (MIACALCIN/FORTICAL) 200 UNIT/ACT nasal spray USE 1 SPRAY NASALLY(ALTERNATING NOSTRILS) EVERY DAY 3.7 mL 11   cetirizine (ZYRTEC) 5 MG tablet Take 5 mg by mouth daily.     COLLAGEN PO Take by mouth daily.     CORAL CALCIUM PO Take by mouth daily.     CRANBERRY PO Take by mouth daily.     CYANOCOBALAMIN PO Take by mouth daily.     Fluticasone Furoate (ARNUITY ELLIPTA) 100 MCG/ACT AEPB Inhale 1 Inhalation into the lungs daily. 30 each 11   glucosamine-chondroitin 500-400 MG tablet Take 1 tablet by mouth daily.     insulin glargine (LANTUS) 100 UNIT/ML Solostar Pen Inject 40 Units into the skin at bedtime.     Insulin Pen Needle (B-D UF III MINI PEN NEEDLES) 31G X 5 MM MISC USE 1 TO 2 TIMES DAILY 200 each 4   KRILL OIL PO Take by mouth.     levothyroxine (SYNTHROID, LEVOTHROID) 100 MCG tablet Take 100 mcg by mouth daily before breakfast.     MAGNESIUM PO Take 1 tablet by mouth in the morning and at bedtime.     meclizine (ANTIVERT) 25 MG tablet Take 1 tablet (25 mg total) by  mouth 3 (three) times daily as needed. 90 tablet 0   metoprolol succinate (TOPROL-XL) 50 MG 24 hr tablet TAKE 1 TABLET(50 MG) BY MOUTH DAILY 90 tablet 3   montelukast (SINGULAIR) 10 MG tablet TAKE 1 TABLET(10 MG) BY MOUTH AT BEDTIME 90 tablet 3   Multiple Vitamin (MULTIVITAMIN) tablet Take 1 tablet by mouth daily. Centrum Silver     Multiple Vitamins-Minerals (OCUVITE PO) Take 1 tablet by mouth daily.     NOVOLOG FLEXPEN 100 UNIT/ML FlexPen 16 units 1st meal 22 units 2nd meal     omeprazole (PRILOSEC) 20 MG capsule Take 1 capsule (20 mg total) by mouth daily. 90 capsule 3   Spacer/Aero-Holding Chambers (VALVED HOLDING CHAMBER) DEVI      TURMERIC PO Take 400 mg by mouth daily.     vitamin C (ASCORBIC ACID) 500 MG tablet Take 500 mg by mouth daily.     VITAMIN D PO Take by mouth daily.     No current  facility-administered medications on file prior to visit.     ROS see history of present illness  Objective  Physical Exam Vitals:   05/29/23 1543  BP: 112/70  Pulse: 73  Temp: 98.3 F (36.8 C)  SpO2: 95%    BP Readings from Last 3 Encounters:  05/29/23 112/70  04/27/23 134/84  02/06/23 112/62   Wt Readings from Last 3 Encounters:  05/29/23 208 lb 12.8 oz (94.7 kg)  04/27/23 205 lb (93 kg)  02/06/23 198 lb 12.8 oz (90.2 kg)    Physical Exam Constitutional:      General: She is not in acute distress.    Appearance: She is not diaphoretic.  Cardiovascular:     Rate and Rhythm: Normal rate and regular rhythm.     Heart sounds: Normal heart sounds.  Pulmonary:     Effort: Pulmonary effort is normal.     Breath sounds: Normal breath sounds.  Abdominal:     General: Bowel sounds are normal. There is no distension.     Palpations: Abdomen is soft.     Tenderness: There is no abdominal tenderness.  Skin:    General: Skin is warm and dry.  Neurological:     Mental Status: She is alert.      Assessment/Plan: Please see individual problem list.  Urine frequency Assessment & Plan: Recent onset issue.  Concerning for possible UTI.  Discussed that certainly some level of neuropsychiatric issues can come from having a UTI.  Will get a urinalysis to evaluate this further.  Will send urine for culture.  Orders: -     POCT Urinalysis Dipstick (Automated); Future -     Urine Culture; Future  Auditory hallucinations Assessment & Plan: Could possibly related to UTI.  We will check for this.  We will check lab work to rule out other underlying causes.  Patient does not seem to have delirium.  Does not seem to have any cognitive changes.  Discussed follow-up with PCP after labs and urine evaluation.  Orders: -     Basic metabolic panel -     TSH    Return in about 2 weeks (around 06/12/2023) for with PCP for auditory hallucinations.   Marikay Alar, MD [facility]  Primary Care [facility]

## 2023-05-29 NOTE — Assessment & Plan Note (Signed)
Recent onset issue.  Concerning for possible UTI.  Discussed that certainly some level of neuropsychiatric issues can come from having a UTI.  Will get a urinalysis to evaluate this further.  Will send urine for culture.

## 2023-05-29 NOTE — Assessment & Plan Note (Signed)
Could possibly related to UTI.  We will check for this.  We will check lab work to rule out other underlying causes.  Patient does not seem to have delirium.  Does not seem to have any cognitive changes.  Discussed follow-up with PCP after labs and urine evaluation.

## 2023-05-30 ENCOUNTER — Ambulatory Visit: Payer: Medicare PPO | Admitting: Internal Medicine

## 2023-05-30 DIAGNOSIS — R350 Frequency of micturition: Secondary | ICD-10-CM | POA: Diagnosis not present

## 2023-05-30 LAB — BASIC METABOLIC PANEL
BUN: 23 mg/dL (ref 6–23)
CO2: 27 mEq/L (ref 19–32)
Calcium: 9.4 mg/dL (ref 8.4–10.5)
Chloride: 99 mEq/L (ref 96–112)
Creatinine, Ser: 0.78 mg/dL (ref 0.40–1.20)
GFR: 66.91 mL/min (ref >60.00)
Glucose, Bld: 130 mg/dL — ABNORMAL HIGH (ref 70–99)
Potassium: 4.7 mEq/L (ref 3.5–5.1)
Sodium: 134 mEq/L — ABNORMAL LOW (ref 135–145)

## 2023-05-30 LAB — TSH: TSH: 3.52 u[IU]/mL (ref 0.35–5.50)

## 2023-05-30 LAB — POC URINALSYSI DIPSTICK (AUTOMATED)
Bilirubin, UA: NEGATIVE
Blood, UA: NEGATIVE
Glucose, UA: NEGATIVE
Ketones, UA: NEGATIVE
Nitrite, UA: NEGATIVE
Protein, UA: NEGATIVE
Spec Grav, UA: 1.01 (ref 1.010–1.025)
Urobilinogen, UA: 0.2 E.U./dL
pH, UA: 6.5 (ref 5.0–8.0)

## 2023-05-31 LAB — URINE CULTURE
MICRO NUMBER:: 15206365
SPECIMEN QUALITY:: ADEQUATE

## 2023-06-08 ENCOUNTER — Other Ambulatory Visit: Payer: Medicare PPO | Admitting: Pharmacist

## 2023-06-08 ENCOUNTER — Other Ambulatory Visit: Payer: Self-pay | Admitting: Internal Medicine

## 2023-06-08 NOTE — Progress Notes (Signed)
06/08/2023 Name: [name] MRN: [id number] DOB: 08-03-1931  Chief Complaint  Patient presents with   Medication Management    [name] is a 89 y.o. year old female who presented for a telephone visit.   They were referred to the pharmacist by their PCP for assistance in managing complex medication management. Patient's daughter, [name], was also present for the call  Subjective:  Care Team: Primary Care Provider: [name], MD ; Next Scheduled Visit: 07/31/23 Endocrinologist [place]; Next Scheduled Visit: 06/19/23  Medication Access/Adherence  Current Pharmacy:  [id number] [place], [place] - [address] AT [facility] [address] [place] [address] Phone: [phone] Fax: [phone]   Patient reports affordability concerns with their medications: No  Patient reports access/transportation concerns to their pharmacy: No  Patient reports adherence concerns with their medications:  No     Diabetes:  Current medications: Lantus 40 units daily, Novolog 16/22 units with meals Medications tried in the past: metformin XR - diarrhea leading to severe dehydration; Jardiance - yeast infection  Follows with Dr. Tedd Sias  Discussed CGM, she is not interested.   Patient denies hypoglycemic s/sx including dizziness, shakiness, sweating.   Hyperlipidemia/ASCVD Risk Reduction  Current lipid lowering medications: atorvastatin 20 mg daily  Antiplatelet regimen: aspirin 81 mg daily  Atrial Fibrillation and Hypertension:  Current medications: Rate Control: metoprolol succinate 50 mg daily Rhythm Control:  Anticoagulation Regimen: none - previously declined DOAC  Additional antihypertensives: amlodipine 5 mg daily  Asthma:  Current medications: montelukast 10 mg daily, Arnuity 100 mg 1 puff daily, albuterol HFA PRN   Osteoporosis:  Current medications: nasal calcitonin 1 spray daily  Current  supplements: Vitamin D  Most recent DEXA: 03/17/2009 - t score of -2.3  Supplements   Aloe Vera (10,000 mg) - 1 soft gel, once daily Balance of Nature Fruits - 3 capsules, once daily  Balance of Nature Veggies - 3 capsules, once daily Biocell Collagen II - 1 capsule, once daily B12/CYANOCOBALAMIN PO (1,000 mcg) - 1 tablet, once daily  Centrum Silver Women 50+ Multi vitamin - 1 tablet, once daily  Chelated Zinc - 1 tablet, once daily  Cranberry (30,000 mg) - 1 capsule, once daily  CoQ10 (100 mg) - 1 soft gel, once daily Coral Calcium (1,000 mg) - 1 capsule, once daily D3 (50 mcg) - 1 soft gel, once daily  Fish Oil (1,200 mg) - 1 soft gel, once daily  Glucosamine Chondroitin - 1 capsule, once daily  Hair Volume - 1 tablet, once daily  Krill Oil (350 mg) - 1 soft gel, once daily  Liposomal Vitamin C (500 mg) - 1 soft gel, once daily Magnesium (250 mg) - 1 tablet, twice daily Occuvite Adult 50+ - 1 soft gel, once daily  Superbeets Heart Chews - 1 chew daily  Turmeric Curcumin - 1 capsule, once daily  10 Strain Probiotic - 1 capsule, once daily   Objective:  Lab Results  Component Value Date   HGBA1C 8.7 (H) 05/02/2022    Lab Results  Component Value Date   CREATININE 0.78 05/29/2023   BUN 23 05/29/2023   NA 134 (L) 05/29/2023   K 4.7 05/29/2023   CL 99 05/29/2023   CO2 27 05/29/2023    Lab Results  Component Value Date   CHOL 88 05/02/2022   HDL 48.20 05/02/2022   LDLCALC 7 05/02/2022   LDLDIRECT 45.0 09/09/2021   TRIG 164.0 (H)  05/02/2022   CHOLHDL 2 05/02/2022    Medications Reviewed Today     Reviewed by Alden Hipp, RPH-CPP (Pharmacist) on 06/08/23 at 1512  Med List Status: <None>   Medication Order Taking? Sig Documenting Provider Last Dose Status Informant  ACCU-CHEK SMARTVIEW test strip [id number]   [provider]  Active            Med Note (NEWCOMER MCCLAIN, BRANDY L   Tue May 03, 2022 11:17 AM)    acetaminophen (TYLENOL) 500 MG  tablet [id number] Yes Take 500 mg by mouth every 4 (four) hours as needed. [provider] Taking Active   albuterol (VENTOLIN HFA) 108 (90 Base) MCG/ACT inhaler [id number] Yes Inhale 2 puffs into the lungs every 6 (six) hours as needed. [name], MD Taking Active   ALOE PO [id number] Yes Take 1 capsule by mouth daily. [provider] Taking Active   ALPRAZolam Prudy Feeler) 0.25 MG tablet [id number] Yes TAKE 1 TABLET(0.25 MG) BY MOUTH TWICE DAILY AS NEEDED FOR ANXIETY [name], MD Taking Active   amLODipine (NORVASC) 5 MG tablet [id number] Yes TAKE 1 TABLET(5 MG) BY MOUTH DAILY Mariah Milling, Tollie Pizza, MD Taking Active   aspirin 81 MG tablet [id number] Yes Take 81 mg by mouth daily. [provider] Taking Active   atorvastatin (LIPITOR) 20 MG tablet [id number] Yes TAKE 1 TABLET BY MOUTH EVERY DAY [name], MD Taking Active   B-D ULTRA-FINE 33 LANCETS MISC [id number]  Use to test blood sugar twice daily dx: E11.9 [name], MD  Active   calcitonin, salmon, (MIACALCIN/FORTICAL) 200 UNIT/ACT nasal spray [id number] Yes USE 1 SPRAY NASALLY(ALTERNATING NOSTRILS) EVERY DAY [name], MD Taking Active   cetirizine (ZYRTEC) 5 MG tablet [id number] Yes Take 5 mg by mouth daily. [provider] Taking Active   COLLAGEN PO [id number] Yes Take by mouth daily. [provider] Taking Active   CORAL CALCIUM PO [id number] Yes Take by mouth daily. [provider] Taking Active   CRANBERRY PO [id number] Yes Take by mouth daily. [provider] Taking Active   CYANOCOBALAMIN PO [id number] Yes Take by mouth daily. [provider] Taking Active   Fluticasone Furoate (ARNUITY ELLIPTA) 100 MCG/ACT AEPB [id number] Yes Inhale 1 Inhalation into the lungs daily. [name], MD Taking Active   glucosamine-chondroitin 500-400 MG tablet [id number] Yes Take 1 tablet by mouth daily. [provider] Taking Active   insulin glargine  (LANTUS) 100 UNIT/ML Solostar Pen [id number] Yes Inject 40 Units into the skin at bedtime. [provider] Taking Active            Med Note (NEWCOMER [facility], BRANDY L   Tue May 03, 2022 11:18 AM)    Insulin Pen Needle (B-D UF III MINI PEN NEEDLES) 31G X 5 MM MISC [id number] Yes USE 1 TO 2 TIMES DAILY [name], MD Taking Active   KRILL OIL PO [id number] Yes Take by mouth. [provider] Taking Active   levothyroxine (SYNTHROID, LEVOTHROID) 100 MCG tablet [id number] Yes Take 100 mcg by mouth daily before breakfast. [provider] Taking Active   MAGNESIUM PO [id number] Yes Take 1 tablet by mouth in the morning and at bedtime. [provider] Taking Active   meclizine (ANTIVERT) 25 MG tablet [id number] Yes Take 1 tablet (25 mg total) by mouth 3 (three) times daily as needed. [name], MD Taking Active   metoprolol succinate (TOPROL-XL) 50 MG 24 hr tablet [id number] Yes  TAKE 1 TABLET(50 MG) BY MOUTH DAILY Gollan, Tollie Pizza, MD Taking Active   montelukast (SINGULAIR) 10 MG tablet [id number] Yes TAKE 1 TABLET(10 MG) BY MOUTH AT BEDTIME [name], MD Taking Active   Multiple Vitamin (MULTIVITAMIN) tablet [id number] Yes Take 1 tablet by mouth daily. Centrum Silver [provider] Taking Active   Multiple Vitamins-Minerals (OCUVITE PO) [id number] Yes Take 1 tablet by mouth daily. [provider] Taking Active   NOVOLOG FLEXPEN 100 UNIT/ML FlexPen [id number] Yes 16 units 1st meal 22 units 2nd meal [provider] Taking Active   omeprazole (PRILOSEC) 20 MG capsule [id number] Yes Take 1 capsule (20 mg total) by mouth daily. [name], MD Taking Active   Spacer/Aero-Holding Chambers (VALVED [place]) [place] [id number]   [provider]  Active   TURMERIC PO [id number] Yes Take 400 mg by mouth daily. [provider] Taking Active   vitamin C (ASCORBIC ACID) 500 MG tablet [id number] Yes Take 500 mg by mouth  daily. [provider] Taking Active   VITAMIN D PO [id number] Yes Take by mouth daily. [provider] Taking Active               Assessment/Plan:    Diabetes: - At goal <8% - Discussed risk of hypoglycemia and importance of making sure she eats regular meals.  - Agree with Dr. Tedd Sias that avoiding GLP1 likely appropriate given prior intolerances.  - Recommend to continue current regimen. Strongly consider CGM if more frequent episodes of hypoglycemia moving forward.   Hyperlipidemia/ASCVD Risk Reduction - Controlled - Recommend to continue current regimen at this time  Atrial Fibrillation and Hypertension: - Appropriately managed - Recommend to continue current regimen at this time  Asthma: - Relatively well controlled - Recommend to continue current regimen at this time  Osteoporosis: - Generally recommend avoidance of nasal calcitonin, given lower impact on fractures, bone mineral density, and higher risk of side effects. Consider benefit vs risk of continued use. Given esophageal concerns, may avoid oral bisphosphonates.  - Recommend continuation of vitamin D and calcium.   Supplements - Discussed each supplement and general evidence for use. Overall discussed that supplements are not regulated and difficult to confirm purity/potency. Discussed turmeric, fish oils have shown increased risk of bleeds in combination with anticoagulants/antiplatelet agents.  Follow Up Plan: Declines any continued pharmacy needs at this time. Follow up with PCP as scheduled  Catie Eppie Gibson, PharmD, BCACP, CPP Clinical Pharmacist [facility] [phone]

## 2023-06-08 NOTE — Patient Instructions (Addendum)
It was great talking to you today, Ms. Tercero!  Please reach out with any future questions or concerns.   Catie Eppie Gibson, PharmD, BCACP, CPP Clinical Pharmacist [facility] [phone]

## 2023-06-13 ENCOUNTER — Encounter: Payer: Self-pay | Admitting: Internal Medicine

## 2023-06-13 ENCOUNTER — Ambulatory Visit: Payer: Medicare PPO | Admitting: Internal Medicine

## 2023-06-13 VITALS — BP 104/60 | HR 58 | Temp 97.2°F | Ht 62.0 in | Wt 209.0 lb

## 2023-06-13 DIAGNOSIS — E1165 Type 2 diabetes mellitus with hyperglycemia: Secondary | ICD-10-CM | POA: Diagnosis not present

## 2023-06-13 DIAGNOSIS — G4730 Sleep apnea, unspecified: Secondary | ICD-10-CM | POA: Diagnosis not present

## 2023-06-13 DIAGNOSIS — R440 Auditory hallucinations: Secondary | ICD-10-CM

## 2023-06-13 NOTE — Progress Notes (Signed)
Subjective:    Patient ID: [name], female    DOB: [birth year], 89 y.o.   MRN: [id number]  HPI Here with daughter for follow up after recent visit  Seen 2 weeks ago Had more auditory hallucinations---worsened Went in to check for UTI No fever or dysuria Urinalysis showed slight leuks but culture negative  Only hears the voices just upon awakening Will think she heard her son, or deceased husband Doesn't have the hallucinations/voices when fully awake She is not bothered by this  Current Outpatient Medications on File Prior to Visit  Medication Sig Dispense Refill   ACCU-CHEK SMARTVIEW test strip   3   acetaminophen (TYLENOL) 500 MG tablet Take 500 mg by mouth every 4 (four) hours as needed.     albuterol (VENTOLIN HFA) 108 (90 Base) MCG/ACT inhaler Inhale 2 puffs into the lungs every 6 (six) hours as needed. 18 g 1   ALOE PO Take 1 capsule by mouth daily.     ALPRAZolam (XANAX) 0.25 MG tablet TAKE 1 TABLET(0.25 MG) BY MOUTH TWICE DAILY AS NEEDED FOR ANXIETY 180 tablet 0   amLODipine (NORVASC) 5 MG tablet TAKE 1 TABLET(5 MG) BY MOUTH DAILY 90 tablet 3   aspirin 81 MG tablet Take 81 mg by mouth daily.     atorvastatin (LIPITOR) 20 MG tablet TAKE 1 TABLET BY MOUTH EVERY DAY 90 tablet 3   B-D ULTRA-FINE 33 LANCETS MISC Use to test blood sugar twice daily dx: E11.9 200 each 3   calcitonin, salmon, (MIACALCIN/FORTICAL) 200 UNIT/ACT nasal spray USE 1 SPRAY NASALLY(ALTERNATING NOSTRILS) EVERY DAY 3.7 mL 11   cetirizine (ZYRTEC) 5 MG tablet Take 5 mg by mouth daily.     COLLAGEN PO Take by mouth daily.     CORAL CALCIUM PO Take by mouth daily.     CRANBERRY PO Take by mouth daily.     CYANOCOBALAMIN PO Take by mouth daily.     Fluticasone Furoate (ARNUITY ELLIPTA) 100 MCG/ACT AEPB Inhale 1 Inhalation into the lungs daily. 30 each 11   glucosamine-chondroitin 500-400 MG tablet Take 1 tablet by mouth daily.     insulin glargine (LANTUS) 100 UNIT/ML Solostar Pen Inject 40 Units into  the skin at bedtime.     Insulin Pen Needle (B-D UF III MINI PEN NEEDLES) 31G X 5 MM MISC USE 1 TO 2 TIMES DAILY 200 each 4   KRILL OIL PO Take by mouth.     levothyroxine (SYNTHROID, LEVOTHROID) 100 MCG tablet Take 100 mcg by mouth daily before breakfast.     MAGNESIUM PO Take 1 tablet by mouth in the morning and at bedtime.     meclizine (ANTIVERT) 25 MG tablet Take 1 tablet (25 mg total) by mouth 3 (three) times daily as needed. 90 tablet 0   metoprolol succinate (TOPROL-XL) 50 MG 24 hr tablet TAKE 1 TABLET(50 MG) BY MOUTH DAILY 90 tablet 3   montelukast (SINGULAIR) 10 MG tablet TAKE 1 TABLET(10 MG) BY MOUTH AT BEDTIME 90 tablet 3   Multiple Vitamin (MULTIVITAMIN) tablet Take 1 tablet by mouth daily. Centrum Silver     Multiple Vitamins-Minerals (OCUVITE PO) Take 1 tablet by mouth daily.     NOVOLOG FLEXPEN 100 UNIT/ML FlexPen 16 units 1st meal 22 units 2nd meal     omeprazole (PRILOSEC) 20 MG capsule Take 1 capsule (20 mg total) by mouth daily. 90 capsule 3   Spacer/Aero-Holding Chambers (VALVED HOLDING CHAMBER) DEVI      TURMERIC PO Take  400 mg by mouth daily.     vitamin C (ASCORBIC ACID) 500 MG tablet Take 500 mg by mouth daily.     VITAMIN D PO Take by mouth daily.     No current facility-administered medications on file prior to visit.    Allergies  Allergen Reactions   Gabapentin Swelling   Cefdinir     REACTION: unspecified   Ciprofloxacin     rash   Clarithromycin     REACTION: weird dreams (on prednisone at the same time but has tolerated this in the past)   Diclofenac Dermatitis    Topical only  Causes burning to site   Miglitol     REACTION: unspecified   Niacin     REACTION: unspecified   Qvar [Beclomethasone] Other (See Comments)    Feels funny Feels funny   Jardiance [Empagliflozin] Other (See Comments)    Yeast infection    Metformin And Related Diarrhea    Past Medical History:  Diagnosis Date   Allergy    Asthma    Diabetes mellitus    Diastolic  dysfunction    a. 06/2019 Echo: EF 60-65%, no rwma, GR1 DD. Mild MR. Nl RV fxn.   Essential hypertension    GERD (gastroesophageal reflux disease)    Hyperlipidemia    Mixed hyperlipidemia    Osteopenia    PAF (paroxysmal atrial fibrillation) (HCC)    a. Dx in late 1990's - no known recurrence.   Sleep disorder    Thyroid disease     Past Surgical History:  Procedure Laterality Date   CARPAL TUNNEL RELEASE Right    CERVICAL CONE BIOPSY  07/16/1959   CHOLECYSTECTOMY     NASAL POLYP SURGERY  11/15/1999   STRABISMUS SURGERY  1946 / 1967   VAGINAL DELIVERY     x2    Family History  Problem Relation Age of Onset   Asthma Mother    Coronary artery disease Father     Social History   Socioeconomic History   Marital status: Widowed    Spouse name: Not on file   Number of children: 2   Years of education: Not on file   Highest education level: Not on file  Occupational History    Employer: RETIRED  Tobacco Use   Smoking status: Never    Passive exposure: Never   Smokeless tobacco: Never  Vaping Use   Vaping status: Never Used  Substance and Sexual Activity   Alcohol use: Not Currently    Alcohol/week: 1.0 standard drink of alcohol    Types: 1 Glasses of wine per week    Comment: 1 glass of wine-rare   Drug use: No   Sexual activity: Not Currently  Other Topics Concern   Not on file  Social History Narrative   Widowed 3/23      Has living will   Not sure about formal POA --but requests daughter [name] (and son [name]) to be health care POAs   Would accept resuscitation attempts   no feeding tubes if cognitively unaware   Social Determinants of Health   Financial Resource Strain: Low Risk  (04/09/2022)   Received from [facility], [facility]   Overall Financial Resource Strain (CARDIA)    Difficulty of Paying Living Expenses: Not hard at all  Food Insecurity: No Food Insecurity (04/09/2022)   [facility], [facility]   Hunger  Vital Sign    Worried About Running Out of Food in  the Last Year: Never true    Ran Out of Food in the Last Year: Never true  Transportation Needs: No Transportation Needs (04/09/2022)   Received from [facility], [facility]   [facility] - Transportation    Lack of Transportation (Medical): No    Lack of Transportation (Non-Medical): No  Physical Activity: Not on file  Stress: Not on file  Social Connections: Not on file  Intimate Partner Violence: Not on file   Review of Systems Daughter wonders about sleep apnea---she notes stoppage, with slight noises and then a big breath in Not really refreshed after sleep Naps a lot in the day    Objective:   Physical Exam Psychiatric:        Mood and Affect: Mood normal.        Behavior: Behavior normal.            Assessment & Plan:

## 2023-06-13 NOTE — Assessment & Plan Note (Signed)
Discussed that this may be a para--sleep phenomenon No problems when wide awake Discussed that I would not evaluate for UTI with this--unless she had specific symptoms

## 2023-06-13 NOTE — Assessment & Plan Note (Addendum)
Does have periods of stoppage (with some noises) May have OSA--but not excited about a CPAP (husband dealt with this) Will hold off on further evaluation unless worsens If so, would make referral to sleep med

## 2023-06-19 DIAGNOSIS — Z794 Long term (current) use of insulin: Secondary | ICD-10-CM | POA: Diagnosis not present

## 2023-06-19 DIAGNOSIS — E1159 Type 2 diabetes mellitus with other circulatory complications: Secondary | ICD-10-CM | POA: Diagnosis not present

## 2023-06-19 DIAGNOSIS — E113293 Type 2 diabetes mellitus with mild nonproliferative diabetic retinopathy without macular edema, bilateral: Secondary | ICD-10-CM | POA: Diagnosis not present

## 2023-06-19 DIAGNOSIS — I152 Hypertension secondary to endocrine disorders: Secondary | ICD-10-CM | POA: Diagnosis not present

## 2023-06-19 DIAGNOSIS — E063 Autoimmune thyroiditis: Secondary | ICD-10-CM | POA: Diagnosis not present

## 2023-06-28 ENCOUNTER — Other Ambulatory Visit: Payer: Self-pay | Admitting: Internal Medicine

## 2023-06-29 NOTE — Telephone Encounter (Signed)
Last filled 03-26-23 #180 Last OV 06-13-23 Next OV 09-18-23 [facility] [address]

## 2023-07-06 ENCOUNTER — Ambulatory Visit: Payer: Medicare PPO | Admitting: Internal Medicine

## 2023-07-06 ENCOUNTER — Encounter: Payer: Self-pay | Admitting: Internal Medicine

## 2023-07-06 VITALS — BP 122/72 | HR 72 | Temp 97.6°F | Ht 62.0 in | Wt 212.0 lb

## 2023-07-06 DIAGNOSIS — L821 Other seborrheic keratosis: Secondary | ICD-10-CM | POA: Diagnosis not present

## 2023-07-06 NOTE — Assessment & Plan Note (Signed)
Reassured that this is not a concern and no action is needed

## 2023-07-06 NOTE — Progress Notes (Signed)
Subjective:    Patient ID: [name], female    DOB: [birth year], 89 y.o.   MRN: [id number]  HPI Here with daughter due to a skin lesion on leg  Gets various rough spots on skin Has them on legs at times Now has one on outer left thigh---looks like ones on back (seb keratoses) Seemed to grow quickly and was "spongy" Seemed to be darker  No pain No itching  Current Outpatient Medications on File Prior to Visit  Medication Sig Dispense Refill   ACCU-CHEK SMARTVIEW test strip   3   acetaminophen (TYLENOL) 500 MG tablet Take 500 mg by mouth every 4 (four) hours as needed.     albuterol (VENTOLIN HFA) 108 (90 Base) MCG/ACT inhaler Inhale 2 puffs into the lungs every 6 (six) hours as needed. 18 g 1   ALOE PO Take 1 capsule by mouth daily.     ALPRAZolam (XANAX) 0.25 MG tablet TAKE 1 TABLET(0.25 MG) BY MOUTH TWICE DAILY AS NEEDED FOR ANXIETY 180 tablet 0   amLODipine (NORVASC) 5 MG tablet TAKE 1 TABLET(5 MG) BY MOUTH DAILY 90 tablet 3   aspirin 81 MG tablet Take 81 mg by mouth daily.     atorvastatin (LIPITOR) 20 MG tablet TAKE 1 TABLET BY MOUTH EVERY DAY 90 tablet 3   B-D ULTRA-FINE 33 LANCETS MISC Use to test blood sugar twice daily dx: E11.9 200 each 3   calcitonin, salmon, (MIACALCIN/FORTICAL) 200 UNIT/ACT nasal spray USE 1 SPRAY NASALLY(ALTERNATING NOSTRILS) EVERY DAY 3.7 mL 11   cetirizine (ZYRTEC) 5 MG tablet Take 5 mg by mouth daily.     COLLAGEN PO Take by mouth daily.     CORAL CALCIUM PO Take by mouth daily.     CRANBERRY PO Take by mouth daily.     CYANOCOBALAMIN PO Take by mouth daily.     Fluticasone Furoate (ARNUITY ELLIPTA) 100 MCG/ACT AEPB Inhale 1 Inhalation into the lungs daily. 30 each 11   glucosamine-chondroitin 500-400 MG tablet Take 1 tablet by mouth daily.     insulin glargine (LANTUS) 100 UNIT/ML Solostar Pen Inject 40 Units into the skin at bedtime.     Insulin Pen Needle (B-D UF III MINI PEN NEEDLES) 31G X 5 MM MISC USE 1 TO 2 TIMES DAILY 200 each 4    KRILL OIL PO Take by mouth.     levothyroxine (SYNTHROID, LEVOTHROID) 100 MCG tablet Take 100 mcg by mouth daily before breakfast.     MAGNESIUM PO Take 1 tablet by mouth in the morning and at bedtime.     meclizine (ANTIVERT) 25 MG tablet Take 1 tablet (25 mg total) by mouth 3 (three) times daily as needed. 90 tablet 0   metoprolol succinate (TOPROL-XL) 50 MG 24 hr tablet TAKE 1 TABLET(50 MG) BY MOUTH DAILY 90 tablet 3   montelukast (SINGULAIR) 10 MG tablet TAKE 1 TABLET(10 MG) BY MOUTH AT BEDTIME 90 tablet 3   Multiple Vitamin (MULTIVITAMIN) tablet Take 1 tablet by mouth daily. Centrum Silver     Multiple Vitamins-Minerals (OCUVITE PO) Take 1 tablet by mouth daily.     NOVOLOG FLEXPEN 100 UNIT/ML FlexPen 16 units 1st meal 22 units 2nd meal     omeprazole (PRILOSEC) 20 MG capsule Take 1 capsule (20 mg total) by mouth daily. 90 capsule 3   Spacer/Aero-Holding Chambers (VALVED HOLDING CHAMBER) DEVI      TURMERIC PO Take 400 mg by mouth daily.     vitamin C (ASCORBIC ACID)  500 MG tablet Take 500 mg by mouth daily.     VITAMIN D PO Take by mouth daily.     No current facility-administered medications on file prior to visit.    Allergies  Allergen Reactions   Gabapentin Swelling   Cefdinir     REACTION: unspecified   Ciprofloxacin     rash   Clarithromycin     REACTION: weird dreams (on prednisone at the same time but has tolerated this in the past)   Diclofenac Dermatitis    Topical only  Causes burning to site   Miglitol     REACTION: unspecified   Niacin     REACTION: unspecified   Qvar [Beclomethasone] Other (See Comments)    Feels funny Feels funny   Jardiance [Empagliflozin] Other (See Comments)    Yeast infection    Metformin And Related Diarrhea    Past Medical History:  Diagnosis Date   Allergy    Asthma    Diabetes mellitus    Diastolic dysfunction    a. 06/2019 Echo: EF 60-65%, no rwma, GR1 DD. Mild MR. Nl RV fxn.   Essential hypertension    GERD  (gastroesophageal reflux disease)    Hyperlipidemia    Mixed hyperlipidemia    Osteopenia    PAF (paroxysmal atrial fibrillation) (HCC)    a. Dx in late 1990's - no known recurrence.   Sleep disorder    Thyroid disease     Past Surgical History:  Procedure Laterality Date   CARPAL TUNNEL RELEASE Right    CERVICAL CONE BIOPSY  07/16/1959   CHOLECYSTECTOMY     NASAL POLYP SURGERY  11/15/1999   STRABISMUS SURGERY  1946 / 1967   VAGINAL DELIVERY     x2    Family History  Problem Relation Age of Onset   Asthma Mother    Coronary artery disease Father     Social History   Socioeconomic History   Marital status: Widowed    Spouse name: Not on file   Number of children: 2   Years of education: Not on file   Highest education level: Not on file  Occupational History    Employer: RETIRED  Tobacco Use   Smoking status: Never    Passive exposure: Never   Smokeless tobacco: Never  Vaping Use   Vaping status: Never Used  Substance and Sexual Activity   Alcohol use: Not Currently    Alcohol/week: 1.0 standard drink of alcohol    Types: 1 Glasses of wine per week    Comment: 1 glass of wine-rare   Drug use: No   Sexual activity: Not Currently  Other Topics Concern   Not on file  Social History Narrative   Widowed 3/23      Has living will   Not sure about formal POA --but requests daughter [name] (and son [name]) to be health care POAs   Would accept resuscitation attempts   no feeding tubes if cognitively unaware   Social Determinants of Health   Financial Resource Strain: Low Risk  (04/09/2022)   Received from [facility], [facility]   Overall Financial Resource Strain (CARDIA)    Difficulty of Paying Living Expenses: Not hard at all  Food Insecurity: No Food Insecurity (04/09/2022)   Received from [facility], [facility]   Hunger Vital Sign    Worried About Running Out of Food in the Last Year: Never true    Ran Out of Food in the  Last Year:  Never true  Transportation Needs: No Transportation Needs (04/09/2022)   Received from [facility], [facility]   [facility] - Transportation    Lack of Transportation (Medical): No    Lack of Transportation (Non-Medical): No  Physical Activity: Not on file  Stress: Not on file  Social Connections: Not on file  Intimate Partner Violence: Not on file   Review of Systems Concerned about her weight gain    Objective:   Physical Exam Constitutional:      Appearance: Normal appearance.  Skin:    Comments: ~15cm seborrheic keratosis on left lateral hip Multiple similar lesions on back  Neurological:     Mental Status: She is alert.            Assessment & Plan:

## 2023-07-25 DIAGNOSIS — H6123 Impacted cerumen, bilateral: Secondary | ICD-10-CM | POA: Diagnosis not present

## 2023-07-25 DIAGNOSIS — H903 Sensorineural hearing loss, bilateral: Secondary | ICD-10-CM | POA: Diagnosis not present

## 2023-07-31 ENCOUNTER — Ambulatory Visit: Payer: Medicare PPO | Admitting: Internal Medicine

## 2023-08-16 ENCOUNTER — Other Ambulatory Visit: Payer: Self-pay | Admitting: Internal Medicine

## 2023-08-16 ENCOUNTER — Telehealth: Payer: Self-pay | Admitting: Cardiovascular Disease

## 2023-08-16 MED ORDER — FUROSEMIDE 40 MG PO TABS: 40.0000 mg | ORAL_TABLET | Freq: Every day | ORAL | 0 refills | Status: DC | PRN

## 2023-08-16 NOTE — Telephone Encounter (Signed)
Spoke w/ pt's daughter [name]. She reports that her mother's wt has increased in the past year since her husband passed away, but she has gained 4 lbs in the past week. She reports that pt's feet and legs are swollen and hard, up to her thighs and abdomen. Pt c/o feeling "tight". Pt has breathing problems, but daughter has noticed wheezing, esp yesterday. She reports that her mother is more tired recently, has started taking naps for hours throughout the day. She has been up often during the night to pee, was up several times last night and is currently still sleeping. Pt does eat a lot of sodium and drinks a lot of water, but the water intake is not more than her normal. Pt is also diabetic on insulin. Pt does not take a diuretic, [name] reports that her father had CHF, but her mother has not been diagnosed w/ this. Pt keeps feet elevated during the day, spends a lot of time in her recliner. She woke up briefly this am and reported that she was up peeing a lot and doesn't feel as tight. Nelly Rout that I will make Dr. Mariah Milling aware of her concerns and call her back w/ his recommendations. She also asks that I copy Dr. Alphonsus Sias to make him aware, as well.

## 2023-08-16 NOTE — Telephone Encounter (Signed)
Spoke to both [place] and pt. Will do the furosemide as needed. I sent that to [facility]. [name] will call cardiology to get her an appt there next week.

## 2023-08-16 NOTE — Telephone Encounter (Signed)
Pt c/o swelling: STAT is pt has developed SOB within 24 hours  How much weight have you gained and in what time span? 4LBS in a week  If swelling, where is the swelling located? Upper legs down to her feet  Are you currently taking a fluid pill? No  Are you currently SOB? No  Do you have a log of your daily weights (if so, list)? No  Have you gained 3 pounds in a day or 5 pounds in a week? 4LBS in a week  Have you traveled recently? No

## 2023-08-17 NOTE — Telephone Encounter (Signed)
Called patient's daughter per DPR. Informed her of the following recommendations from Dr. Mariah Milling.   Can we see if we can get her on the schedule next Wednesday 11:00 or 420 Would recommend she cut back on her sodium intake and fluid intake especially on days with leg swelling She may need to take Lasix 2 or 3 days in a row to get swelling down then as needed Thx TGollan   Daughter verbalized understanding. Patient scheduled to see Dr. Mariah Milling 08/23/23.

## 2023-08-22 NOTE — Progress Notes (Unsigned)
Cardiology Office Note  Date:  08/23/2023   ID:  RAVINA MILNER, DOB 02/20/1931, MRN [id number]  PCP:  [name], MD   Chief Complaint  Patient presents with   Follow-up    F/U, edema, slow but progressive weight gain. Medications reviewed verbally.     HPI:  [name] is a 89 y.o. female with a hx of paroxysmal atrial fibrillation. diagnosed 23 years ago, age 80 In hospital ,lasted 1 week,  none since then ("heart was feeling really weird") chadsvasc 4, on asa, does not want NOAC HTN Hyperlipidemia DM II, followed by Dr. Renae Fickle,  Who presents for routine follow-up of her palpitations/atrial fibrillation  Last seen in clinic by myself 3/24 Lives with daughter since husband died Presenting today with her daughter  She reports worsening Leg swelling and ABD swelling last week Weight was higher, 4 pounds last week Compared to visit March 2024, weight is up 14 pounds from 198 up to 212 She did try 1 or 2 Lasix last week, seem to help her swelling though edema has persisted  Eats 2x a day, high fluid intake, high salt intake  Unclear blood pressure measurements at home Running high today 140 up to 160 systolic  Lab work reviewed A1C 6.9  EKG personally reviewed by myself on todays visit EKG Interpretation Date/Time:  Wednesday August 23 2023 16:52:21 EDT Ventricular Rate:  63 PR Interval:  200 QRS Duration:  92 QT Interval:  444 QTC Calculation: 454 R Axis:   9  Text Interpretation: Normal sinus rhythm Low voltage QRS When compared with ECG of 30-Apr-2021 17:54, No significant change was found Confirmed by Julien Nordmann [phone]) on 08/23/2023 5:03:52 PM   Other past medical history reviewed  History of falls Uses a walker for ambulation  lost her husband last year, aspiration PNA, sepsis Passed in his sleep  In hospital 5/23, diarrhea from high dose metformin  Followed by endocrine A1C 8.5, on insulin  Carpel tunnel pain bilaterally, had  surgery on 1 side  Other past medical history reviewed Prior dizzy episodes 2020 CT and MRI head Severe stenosis of right posterior artery Diffuse atherosclerosis.  On aspirin  Echo 06/2016: Normal EF >55%, normal LA size Event monitor 06/2016: NSR, no atrial fib  episode of chest pain, in the ED 07/2016  CT chest  2015: Atherosclerotic calcification aorta and minimally in coronary arteries.   PMH:   has a past medical history of Allergy, Asthma, Diabetes mellitus, Diastolic dysfunction, Essential hypertension, GERD (gastroesophageal reflux disease), Hyperlipidemia, Mixed hyperlipidemia, Osteopenia, PAF (paroxysmal atrial fibrillation) (HCC), Sleep disorder, and Thyroid disease.  PSH:    Past Surgical History:  Procedure Laterality Date   CARPAL TUNNEL RELEASE Right    CERVICAL CONE BIOPSY  07/16/1959   CHOLECYSTECTOMY     NASAL POLYP SURGERY  11/15/1999   STRABISMUS SURGERY  1946 / 1967   VAGINAL DELIVERY     x2    Current Outpatient Medications  Medication Sig Dispense Refill   ACCU-CHEK SMARTVIEW test strip   3   acetaminophen (TYLENOL) 500 MG tablet Take 500 mg by mouth every 4 (four) hours as needed.     albuterol (VENTOLIN HFA) 108 (90 Base) MCG/ACT inhaler Inhale 2 puffs into the lungs every 6 (six) hours as needed. 18 g 1   ALOE PO Take 1 capsule by mouth daily.     ALPRAZolam (XANAX) 0.25 MG tablet TAKE 1 TABLET(0.25 MG) BY MOUTH TWICE DAILY AS NEEDED FOR ANXIETY 180  tablet 0   aspirin 81 MG tablet Take 81 mg by mouth daily.     atorvastatin (LIPITOR) 20 MG tablet TAKE 1 TABLET BY MOUTH EVERY DAY 90 tablet 3   B-D ULTRA-FINE 33 LANCETS MISC Use to test blood sugar twice daily dx: E11.9 200 each 3   calcitonin, salmon, (MIACALCIN/FORTICAL) 200 UNIT/ACT nasal spray USE 1 SPRAY NASALLY(ALTERNATING NOSTRILS) EVERY DAY 3.7 mL 11   cetirizine (ZYRTEC) 5 MG tablet Take 10 mg by mouth daily.     COLLAGEN PO Take by mouth daily.     CORAL CALCIUM PO Take by mouth daily.      CRANBERRY PO Take by mouth daily.     CYANOCOBALAMIN PO Take by mouth daily.     Fluticasone Furoate (ARNUITY ELLIPTA) 100 MCG/ACT AEPB Inhale 1 Inhalation into the lungs daily. 30 each 11   glucosamine-chondroitin 500-400 MG tablet Take 1 tablet by mouth daily.     insulin glargine (LANTUS) 100 UNIT/ML Solostar Pen Inject 40 Units into the skin at bedtime.     Insulin Pen Needle (B-D UF III MINI PEN NEEDLES) 31G X 5 MM MISC USE 1 TO 2 TIMES DAILY 200 each 4   KRILL OIL PO Take by mouth.     levothyroxine (SYNTHROID, LEVOTHROID) 100 MCG tablet Take 100 mcg by mouth daily before breakfast.     MAGNESIUM PO Take 1 tablet by mouth in the morning and at bedtime.     meclizine (ANTIVERT) 25 MG tablet Take 1 tablet (25 mg total) by mouth 3 (three) times daily as needed. 90 tablet 0   metoprolol succinate (TOPROL-XL) 50 MG 24 hr tablet TAKE 1 TABLET(50 MG) BY MOUTH DAILY 90 tablet 3   montelukast (SINGULAIR) 10 MG tablet TAKE 1 TABLET(10 MG) BY MOUTH AT BEDTIME 90 tablet 3   Multiple Vitamin (MULTIVITAMIN) tablet Take 1 tablet by mouth daily. Centrum Silver     Multiple Vitamins-Minerals (OCUVITE PO) Take 1 tablet by mouth daily.     NOVOLOG FLEXPEN 100 UNIT/ML FlexPen 16 units 1st meal 22 units 2nd meal     omeprazole (PRILOSEC) 20 MG capsule Take 1 capsule (20 mg total) by mouth daily. (Patient taking differently: Take 20 mg by mouth 2 (two) times daily before a meal.) 90 capsule 3   Spacer/Aero-Holding Chambers (VALVED HOLDING CHAMBER) DEVI      TURMERIC PO Take 400 mg by mouth daily.     valsartan (DIOVAN) 160 MG tablet Take 1 tablet (160 mg total) by mouth daily. 90 tablet 3   vitamin C (ASCORBIC ACID) 500 MG tablet Take 500 mg by mouth daily.     VITAMIN D PO Take by mouth daily.     furosemide (LASIX) 20 MG tablet Take 1 tablet (20 mg total) by mouth 3 (three) times a week. May take extra tablet daily ( 20 MG) as needed for leg swelling. 90 tablet 3   No current facility-administered  medications for this visit.    Allergies:   Gabapentin, Cefdinir, Ciprofloxacin, Clarithromycin, Diclofenac, Miglitol, Niacin, Qvar [beclomethasone], Jardiance [empagliflozin], and Metformin and related   Social History:  The patient  reports that she has never smoked. She has never been exposed to tobacco smoke. She has never used smokeless tobacco. She reports that she does not currently use alcohol after a past usage of about 1.0 standard drink of alcohol per week. She reports that she does not use drugs.   Family History:   family history includes Asthma in her  mother; Coronary artery disease in her father.    Review of Systems: Review of Systems  Constitutional: Negative.   HENT: Negative.    Respiratory: Negative.    Cardiovascular: Negative.   Gastrointestinal: Negative.   Musculoskeletal: Negative.   Neurological: Negative.   Psychiatric/Behavioral: Negative.    All other systems reviewed and are negative.   PHYSICAL EXAM: VS:  BP (!) 146/66 (BP Location: Left Arm, Patient Position: Supine, Cuff Size: Normal)   Pulse 63   Ht 5\' 2"  (1.575 m)   Wt 212 lb 3.2 oz (96.3 kg)   SpO2 94%   BMI 38.81 kg/m  , BMI Body mass index is 38.81 kg/m. Constitutional:  oriented to person, place, and time. No distress.  HENT:  Head: Grossly normal Eyes:  no discharge. No scleral icterus.  Neck: No JVD, no carotid bruits  Cardiovascular: Regular rate and rhythm, no murmurs appreciated Trace to 1+ pitting lower extremity edema into the thighs Pulmonary/Chest: Clear to auscultation bilaterally, no wheezes or rales Abdominal: Soft.  no distension.  no tenderness.  Musculoskeletal: Normal range of motion Neurological:  normal muscle tone. Coordination normal. No atrophy Skin: Skin warm and dry Psychiatric: normal affect, pleasant  Recent Labs: 01/25/2023: ALT 15; Hemoglobin 13.4; Platelets 253.0 05/29/2023: BUN 23; Creatinine, Ser 0.78; Potassium 4.7; Sodium 134; TSH 3.52    Lipid  Panel Lab Results  Component Value Date   CHOL 88 05/02/2022   HDL 48.20 05/02/2022   LDLCALC 7 05/02/2022   TRIG 164.0 (H) 05/02/2022    Wt Readings from Last 3 Encounters:  08/23/23 212 lb 3.2 oz (96.3 kg)  07/06/23 212 lb (96.2 kg)  06/13/23 209 lb (94.8 kg)     ASSESSMENT AND PLAN:  Paroxysmal atrial fibrillation (HCC) Maintaining normal sinus rhythm, no recent atrial fibrillation On asa, previously declined NOAC  Essential hypertension Given worsening leg swelling recommend she hold amlodipine for now Will transition to valsartan 160 mg daily If additional blood pressure control needed, could add metoprolol succinate or carvedilol  Mixed hyperlipidemia Cholesterol is at goal on the current lipid regimen. No changes to the medications were made.  Cerebrovascular disease On aspirin, cholesterol at goal  Aortic atherosclerosis (HCC) Seen on CT scan Continue Lipitor  Diabetes: HGBA1C improving 6.9, managed by endocrine Moderate diet, on insulin No regular walking program  Shortness of breath Mild significant symptoms, deconditioned Recommended walking program  Stress/adjustment disorder Reports things are stable  Leg swelling Weight up 14 pounds from March 2024 Recommend decreasing sodium intake, Lasix 20 mg 3 times a week Would hold amlodipine Check BMP in 3 to 4 weeks   Total encounter time more than 40 minutes  Greater than 50% was spent in counseling and coordination of care with the patient     Orders Placed This Encounter  Procedures   Basic metabolic panel   EKG 12-Lead     Signed, Dossie Arbour, M.D., Ph.D. 08/23/2023  [facility] [place], [place] [phone]

## 2023-08-23 ENCOUNTER — Ambulatory Visit: Payer: Medicare PPO | Attending: Physician Assistant | Admitting: Cardiovascular Disease

## 2023-08-23 ENCOUNTER — Other Ambulatory Visit: Payer: Self-pay | Admitting: Internal Medicine

## 2023-08-23 ENCOUNTER — Encounter: Payer: Self-pay | Admitting: Cardiovascular Disease

## 2023-08-23 VITALS — BP 146/66 | HR 63 | Ht 62.0 in | Wt 212.2 lb

## 2023-08-23 DIAGNOSIS — E1169 Type 2 diabetes mellitus with other specified complication: Secondary | ICD-10-CM

## 2023-08-23 DIAGNOSIS — E782 Mixed hyperlipidemia: Secondary | ICD-10-CM | POA: Diagnosis not present

## 2023-08-23 DIAGNOSIS — Z79899 Other long term (current) drug therapy: Secondary | ICD-10-CM | POA: Diagnosis not present

## 2023-08-23 DIAGNOSIS — I480 Paroxysmal atrial fibrillation: Secondary | ICD-10-CM | POA: Diagnosis not present

## 2023-08-23 DIAGNOSIS — I700 Atherosclerosis of aorta: Secondary | ICD-10-CM

## 2023-08-23 DIAGNOSIS — I10 Essential (primary) hypertension: Secondary | ICD-10-CM

## 2023-08-23 MED ORDER — FUROSEMIDE 20 MG PO TABS: 20.0000 mg | ORAL_TABLET | ORAL | 3 refills | Status: DC

## 2023-08-23 MED ORDER — VALSARTAN 160 MG PO TABS: 160.0000 mg | ORAL_TABLET | Freq: Every day | ORAL | 3 refills | Status: DC

## 2023-08-23 NOTE — Patient Instructions (Addendum)
Medication Instructions:  Lasix 20 mg three times a week, With extra lasix 20 mg for leg swelling as needed  Hold the amlodipine Start valsartan 160  mg daily  If you need a refill on your cardiac medications before your next appointment, please call your pharmacy.   Lab work: Your provider would like for you to return in 3 weeks to have the following labs drawn: BMP.   Please go to [facility] [address] ([facility] [address], [place] [address] You do not need an appointment.  They are open from 7:30 am-4 pm.  Lunch from 1:00 pm- 2:00 pm You will not need to be fasting.   You may also go to any of these [facility] locations:  [employer]  - [address] [employer] [address] Chief Technology Officer)      Testing/Procedures: No new testing needed  Follow-Up: At [facility], you and your health needs are our priority.  As part of our continuing mission to provide you with exceptional heart care, we have created designated Provider Care Teams.  These Care Teams include your primary Cardiologist (physician) and Advanced Practice Providers (APPs -  Physician Assistants and Nurse Practitioners) who all work together to provide you with the care you need, when you need it.  You will need a follow up appointment in 6 months  Providers on your designated Care Team:   Nicolasa Ducking, NP Eula Listen, PA-C Cadence Fransico Michael, [place]  COVID-19 Vaccine Information can be found at: [email or web address] For questions related to vaccine distribution or appointments, please email [email or web address] or call [phone].

## 2023-08-24 ENCOUNTER — Other Ambulatory Visit: Payer: Self-pay

## 2023-08-24 MED ORDER — FUROSEMIDE 20 MG PO TABS: 20.0000 mg | ORAL_TABLET | ORAL | 2 refills | Status: DC

## 2023-08-28 ENCOUNTER — Telehealth: Payer: Self-pay | Admitting: Cardiovascular Disease

## 2023-08-28 NOTE — Telephone Encounter (Signed)
Pt c/o medication issue:  1. Name of Medication:   valsartan (DIOVAN) 160 MG tablet    2. How are you currently taking this medication (dosage and times per day)?  Take 1 tablet (160 mg total) by mouth daily.       3. Are you having a reaction (difficulty breathing--STAT)? No  4. What is your medication issue? Pt's daughter [name] is requesting a callback regarding this medication making pt hands feel tight and after a few hours she begins to feel like she's had a lot of caffeine. Please advise

## 2023-08-28 NOTE — Telephone Encounter (Signed)
Spoke w/ pt and her daughter [name]. Pt was switched from amlodipine to valsartan 160 mg secondary to swelling in her legs, thighs and abdomen.   Since starting the valsartan on Friday, pt has noticed a jittery felling that feels like she has had too much coffee. She states that her heart is not racing, HR is WNL, but the rest of her body feels like it is vibrating.   Both of her hands feel stiff and swollen, though do not appear to be. She is worried this is a possible side effect and would like to know if she can cut in 1/2 and take BID or switch to another medication.  Pt also reports that she has not been taking her lasix b/c she feels that it is not doing any good. Had lengthy discussion w/ them both about fluid and sodium intake, the benefits of keeping feet elevated and wearing compression hose.   After some hesitation, pt is agreeable to all of these. Pt has been drinking water b/c a trainer told her that increasing her water would help her lose wt.  Discussed her diet w/ [name]. Pt only eats 2 meals per day.  She has a small bowl of cream of wheat made w/ water, fruit, a 1/2 a chocolate protein bar and soft boiled egg for breakfast; for dinner she will have a small portion of meat, vegetables and a starch such as pasta. Discussed w/ her ways to increase pt's protein and limiting salt.   Advised them both that I will make Dr. Mariah Milling aware of their concerns and call them back w/ his recommendation. Pt will try cutting her valsartan in 1/2 BID until she hears back to see if she can tolerate this better.

## 2023-08-29 NOTE — Telephone Encounter (Signed)
Called and spoke with daughter [name] per [place]. Informed her of the following recommendations from Dr. Mariah Milling.  Okay to cut the valsartan in half and take half in the morning half in the p.m. Or could try taking the whole thing right before bed Thx TG   Daughter verbalized understanding.

## 2023-08-31 ENCOUNTER — Ambulatory Visit: Payer: Medicare PPO | Admitting: Physician Assistant

## 2023-09-08 ENCOUNTER — Encounter: Payer: Self-pay | Admitting: Emergency Medicine

## 2023-09-14 ENCOUNTER — Telehealth: Payer: Self-pay | Admitting: Cardiovascular Disease

## 2023-09-14 NOTE — Telephone Encounter (Signed)
Daughter stated patient has not been taking furosemide (LASIX) 20 MG tablet and has not done the lab work ordered by Dr. Mariah Milling as yet.  Daughter stated patient will start taking the furosemide and wants to know when patient should do lab work.

## 2023-09-14 NOTE — Telephone Encounter (Signed)
Returned the call to the patient's daughter, per the dpr. She stated that the patient did not start the Furosemide at the same time as the Valsartan because she did not want to start two medications at the same time. She  has been on the Valsartan and wants to start the Furosemide 20 mg tid on Monday.  The daughter stated that the patient's swelling has been better off of the Amlodipine but she still has some mild swelling. She will come back for labs in 2 weeks.

## 2023-09-15 NOTE — Telephone Encounter (Signed)
Called and spoke with daughter per DPR. Informed daughter of the following from Dr. Mariah Milling.  Message below reported Lasix 3 times daily She was instructed to take Lasix 20 mg 3 times a week (not daily) Lab work in 2 to 3 weeks would be fine Thx TG   Daughter verbalizes understanding.

## 2023-09-18 ENCOUNTER — Ambulatory Visit: Payer: Medicare PPO | Admitting: Internal Medicine

## 2023-09-18 ENCOUNTER — Encounter: Payer: Self-pay | Admitting: Internal Medicine

## 2023-09-18 VITALS — BP 118/80 | HR 62 | Temp 98.8°F | Ht 62.5 in | Wt 215.0 lb

## 2023-09-18 DIAGNOSIS — I700 Atherosclerosis of aorta: Secondary | ICD-10-CM | POA: Diagnosis not present

## 2023-09-18 DIAGNOSIS — J4520 Mild intermittent asthma, uncomplicated: Secondary | ICD-10-CM

## 2023-09-18 DIAGNOSIS — K219 Gastro-esophageal reflux disease without esophagitis: Secondary | ICD-10-CM | POA: Diagnosis not present

## 2023-09-18 DIAGNOSIS — E1159 Type 2 diabetes mellitus with other circulatory complications: Secondary | ICD-10-CM

## 2023-09-18 DIAGNOSIS — Z0000 Encounter for general adult medical examination without abnormal findings: Secondary | ICD-10-CM | POA: Diagnosis not present

## 2023-09-18 DIAGNOSIS — Z794 Long term (current) use of insulin: Secondary | ICD-10-CM

## 2023-09-18 DIAGNOSIS — Z23 Encounter for immunization: Secondary | ICD-10-CM | POA: Diagnosis not present

## 2023-09-18 DIAGNOSIS — F39 Unspecified mood [affective] disorder: Secondary | ICD-10-CM

## 2023-09-18 DIAGNOSIS — I10 Essential (primary) hypertension: Secondary | ICD-10-CM | POA: Diagnosis not present

## 2023-09-18 MED ORDER — HYDROCORTISONE 2.5 % EX CREA: TOPICAL_CREAM | Freq: Three times a day (TID) | CUTANEOUS | 3 refills | Status: AC | PRN

## 2023-09-18 MED ORDER — OMEPRAZOLE 20 MG PO CPDR: 20.0000 mg | DELAYED_RELEASE_CAPSULE | Freq: Two times a day (BID) | ORAL | 3 refills | Status: DC

## 2023-09-18 NOTE — Assessment & Plan Note (Signed)
Has done better with the arnuity during allergy season

## 2023-09-18 NOTE — Progress Notes (Signed)
Hearing Screening - Comments:: Has hearing aids. Wearing them today. Vision Screening - Comments:: May 2024

## 2023-09-18 NOTE — Assessment & Plan Note (Signed)
I have personally reviewed the Medicare Annual Wellness questionnaire and have noted 1. The patient's medical and social history 2. Their use of alcohol, tobacco or illicit drugs 3. Their current medications and supplements 4. The patient's functional ability including ADL's, fall risks, home safety risks and hearing or visual             impairment. 5. Diet and physical activities 6. Evidence for depression or mood disorders  The patients weight, height, BMI and visual acuity have been recorded in the chart I have made referrals, counseling and provided education to the patient based review of the above and I have provided the pt with a written personalized care plan for preventive services.  I have provided you with a copy of your personalized plan for preventive services. Please take the time to review along with your updated medication list.  Done with cancer screening Not really able to exercise Flu vaccine today Recommended COVID update and RSV at pharmacy Is due for Td

## 2023-09-18 NOTE — Progress Notes (Signed)
Subjective:    Patient ID: [name], female    DOB: 1931-09-25, 89 y.o.   MRN: [id number]  HPI Here with daughter for Medicare wellness visit and follow up of chronic health conditions Reviewed advanced directives Reviewed other doctors--Dr Gollan--cardiology, Dr Tedd Sias --endocrine, Dr Thomasene Lot, Dr Schooler--psychologist, Dr Potter--neurologist, Dr Barbera Setters , Dr Holli Humbles--- dentist No hospitalizations or surgery in past year Vision is fair Hearing is better with aides Not able to really exercise Balance off some---walks with rollator 2 ER visits due to falls---no sig injury Glass of wine before dinner No tobacco  Daughter lives with her--she does the instrumental ADLs. Needs help with shower---uses chair. Independent with bathroom. Slight help with dressing--mostly shoes and combing hair Memory issues continue--mild mostly recall. No recent neurology visits  Having some itching around upper chest Nothing visible---does wear gold cross (not plated) Uses moisturizer Will give 2.5% HC cream to try  Ongoing acid reflux Has needed twice a day---flares with just daily No dysphagia  Has had some low blood sugar reactions--vision changes Last A1c 6.9%--sugars not low with these spells No hallucinations per se  Gets some pain under right breast--when she presses Feels it on the surface but nothing is there No chest pain Breathing is okay Has been taking the arnuity in allergy season--on it now for the fall No syncope--but has to get out of bed slowly to prevent dizziness  Mood is better Still grieving husband Not anhedonic  Taken off amlodipine due to swelling Changed to valsartan and it is better Has held off on furosemide Using support socks  Current Outpatient Medications on File Prior to Visit  Medication Sig Dispense Refill   ACCU-CHEK SMARTVIEW test strip   3   acetaminophen (TYLENOL) 500 MG tablet Take 500 mg by mouth every 4 (four) hours as  needed.     albuterol (VENTOLIN HFA) 108 (90 Base) MCG/ACT inhaler Inhale 2 puffs into the lungs every 6 (six) hours as needed. 18 g 1   ALOE PO Take 1 capsule by mouth daily.     ALPRAZolam (XANAX) 0.25 MG tablet TAKE 1 TABLET(0.25 MG) BY MOUTH TWICE DAILY AS NEEDED FOR ANXIETY 180 tablet 0   aspirin 81 MG tablet Take 81 mg by mouth daily.     atorvastatin (LIPITOR) 20 MG tablet TAKE 1 TABLET BY MOUTH EVERY DAY 90 tablet 3   B-D ULTRA-FINE 33 LANCETS MISC Use to test blood sugar twice daily dx: E11.9 200 each 3   calcitonin, salmon, (MIACALCIN/FORTICAL) 200 UNIT/ACT nasal spray USE 1 SPRAY NASALLY(ALTERNATING NOSTRILS) EVERY DAY 3.7 mL 11   cetirizine (ZYRTEC) 5 MG tablet Take 10 mg by mouth daily.     COLLAGEN PO Take by mouth daily.     CORAL CALCIUM PO Take by mouth daily.     CRANBERRY PO Take by mouth daily.     CYANOCOBALAMIN PO Take by mouth daily.     Fluticasone Furoate (ARNUITY ELLIPTA) 100 MCG/ACT AEPB Inhale 1 Inhalation into the lungs daily. 30 each 11   furosemide (LASIX) 20 MG tablet Take 1 tablet (20 mg total) by mouth 3 (three) times a week. 45 tablet 2   glucosamine-chondroitin 500-400 MG tablet Take 1 tablet by mouth daily.     insulin glargine (LANTUS) 100 UNIT/ML Solostar Pen Inject 40 Units into the skin at bedtime.     Insulin Pen Needle (B-D UF III MINI PEN NEEDLES) 31G X 5 MM MISC USE 1 TO 2 TIMES DAILY 200 each 4  KRILL OIL PO Take by mouth.     levothyroxine (SYNTHROID, LEVOTHROID) 100 MCG tablet Take 100 mcg by mouth daily before breakfast.     MAGNESIUM PO Take 1 tablet by mouth in the morning and at bedtime.     meclizine (ANTIVERT) 25 MG tablet Take 1 tablet (25 mg total) by mouth 3 (three) times daily as needed. 90 tablet 0   metoprolol succinate (TOPROL-XL) 50 MG 24 hr tablet TAKE 1 TABLET(50 MG) BY MOUTH DAILY 90 tablet 3   montelukast (SINGULAIR) 10 MG tablet TAKE 1 TABLET(10 MG) BY MOUTH AT BEDTIME 90 tablet 3   Multiple Vitamin (MULTIVITAMIN) tablet Take  1 tablet by mouth daily. Centrum Silver     Multiple Vitamins-Minerals (OCUVITE PO) Take 1 tablet by mouth daily.     NOVOLOG FLEXPEN 100 UNIT/ML FlexPen 16 units 1st meal 22 units 2nd meal     omeprazole (PRILOSEC) 20 MG capsule Take 1 capsule (20 mg total) by mouth daily. (Patient taking differently: Take 20 mg by mouth 2 (two) times daily before a meal.) 90 capsule 3   Spacer/Aero-Holding Chambers (VALVED HOLDING CHAMBER) DEVI      TURMERIC PO Take 400 mg by mouth daily.     valsartan (DIOVAN) 160 MG tablet Take 1 tablet (160 mg total) by mouth daily. 90 tablet 3   vitamin C (ASCORBIC ACID) 500 MG tablet Take 500 mg by mouth daily.     VITAMIN D PO Take by mouth daily.     No current facility-administered medications on file prior to visit.    Allergies  Allergen Reactions   Gabapentin Swelling   Cefdinir     REACTION: unspecified   Ciprofloxacin     rash   Clarithromycin     REACTION: weird dreams (on prednisone at the same time but has tolerated this in the past)   Diclofenac Dermatitis    Topical only  Causes burning to site   Miglitol     REACTION: unspecified   Niacin     REACTION: unspecified   Qvar [Beclomethasone] Other (See Comments)    Feels funny Feels funny   Jardiance [Empagliflozin] Other (See Comments)    Yeast infection    Metformin And Related Diarrhea    Past Medical History:  Diagnosis Date   Allergy    Asthma    Diabetes mellitus    Diastolic dysfunction    a. 06/2019 Echo: EF 60-65%, no rwma, GR1 DD. Mild MR. Nl RV fxn.   Essential hypertension    GERD (gastroesophageal reflux disease)    Hyperlipidemia    Mixed hyperlipidemia    Osteopenia    PAF (paroxysmal atrial fibrillation) (HCC)    a. Dx in late 1990's - no known recurrence.   Sleep disorder    Thyroid disease     Past Surgical History:  Procedure Laterality Date   CARPAL TUNNEL RELEASE Right    CERVICAL CONE BIOPSY  07/16/1959   CHOLECYSTECTOMY     NASAL POLYP SURGERY   11/15/1999   STRABISMUS SURGERY  1946 / 1967   VAGINAL DELIVERY     x2    Family History  Problem Relation Age of Onset   Asthma Mother    Coronary artery disease Father     Social History   Socioeconomic History   Marital status: Widowed    Spouse name: Not on file   Number of children: 2   Years of education: Not on file   Highest education level: Not  on file  Occupational History    Employer: RETIRED  Tobacco Use   Smoking status: Never    Passive exposure: Never   Smokeless tobacco: Never  Vaping Use   Vaping status: Never Used  Substance and Sexual Activity   Alcohol use: Not Currently    Alcohol/week: 1.0 standard drink of alcohol    Types: 1 Glasses of wine per week    Comment: 1 glass of wine-rare   Drug use: No   Sexual activity: Not Currently  Other Topics Concern   Not on file  Social History Narrative   Widowed 3/23      Has living will   Not sure about formal POA --but requests daughter [name] (and son [name]) to be health care POAs   Would accept resuscitation attempts   no feeding tubes if cognitively unaware   Social Determinants of Health   Financial Resource Strain: Low Risk  (04/09/2022)   Received from [facility], [facility]   Overall Financial Resource Strain (CARDIA)    Difficulty of Paying Living Expenses: Not hard at all  Food Insecurity: No Food Insecurity (04/09/2022)   Received from [facility], [facility]   Hunger Vital Sign    Worried About Running Out of Food in the Last Year: Never true    Ran Out of Food in the Last Year: Never true  Transportation Needs: No Transportation Needs (04/09/2022)   Received from [facility], [facility]   [facility] - Transportation    Lack of Transportation (Medical): No    Lack of Transportation (Non-Medical): No  Physical Activity: Not on file  Stress: Not on file  Social Connections: Not on file  Intimate Partner Violence: Not on file   Review of Systems Appetite is  good Has been gaining weight over the past year---but stable since the summer Sleeps fair--nocturia x 2 Wears seat belt Teeth are okay--keeps up with dentist No suspicious skin lesions Bowels move okay No sig back or joint pains--some trouble with left foot/leg though (and tends to pull muscles)    Objective:   Physical Exam Constitutional:      Appearance: Normal appearance.  HENT:     Mouth/Throat:     Pharynx: No oropharyngeal exudate or posterior oropharyngeal erythema.  Eyes:     Conjunctiva/sclera: Conjunctivae normal.     Pupils: Pupils are equal, round, and reactive to light.     Comments: Mild right esotropia  Cardiovascular:     Rate and Rhythm: Normal rate and regular rhythm.     Pulses: Normal pulses.     Heart sounds: No murmur heard.    No gallop.  Pulmonary:     Effort: Pulmonary effort is normal.     Breath sounds: Normal breath sounds. No wheezing or rales.  Abdominal:     Palpations: Abdomen is soft.     Tenderness: There is no abdominal tenderness.  Musculoskeletal:     Cervical back: Neck supple.     Right lower leg: No edema.     Left lower leg: No edema.  Lymphadenopathy:     Cervical: No cervical adenopathy.  Skin:    Findings: No rash.  Neurological:     General: No focal deficit present.     Mental Status: She is alert and oriented to person, place, and time.     Comments: Word naming 9/1 minute Recall 3/3  Psychiatric:        Mood and Affect: Mood  normal.        Behavior: Behavior normal.            Assessment & Plan:

## 2023-09-18 NOTE — Assessment & Plan Note (Signed)
Does okay with the omeprazole 20 bid (didn't tolerate wean)

## 2023-09-18 NOTE — Assessment & Plan Note (Signed)
Edema better on valsartan 40 bid Hasn't needed the furosemide BP Readings from Last 3 Encounters:  09/18/23 118/80  08/23/23 (!) 146/66  07/06/23 122/72

## 2023-09-18 NOTE — Assessment & Plan Note (Signed)
On imaging Is on the atorvastatin 20

## 2023-09-18 NOTE — Assessment & Plan Note (Signed)
Last 1c down to 6.9% No documented hypoglycemia On novolog 16/22 Lantus 40 units daily

## 2023-09-18 NOTE — Assessment & Plan Note (Signed)
Still grieves--but no persistent depression Just uses the alprazolam at bedtime

## 2023-09-28 ENCOUNTER — Other Ambulatory Visit: Payer: Self-pay | Admitting: Internal Medicine

## 2023-09-29 NOTE — Telephone Encounter (Signed)
Last filled 06-29-23 #180 Last OV 09-18-23 No Future OV [facility] [address]

## 2023-10-20 ENCOUNTER — Other Ambulatory Visit: Payer: Self-pay | Admitting: Internal Medicine

## 2023-11-21 DIAGNOSIS — H903 Sensorineural hearing loss, bilateral: Secondary | ICD-10-CM | POA: Diagnosis not present

## 2023-11-21 DIAGNOSIS — H6123 Impacted cerumen, bilateral: Secondary | ICD-10-CM | POA: Diagnosis not present

## 2023-11-28 ENCOUNTER — Ambulatory Visit: Payer: Self-pay | Admitting: Internal Medicine

## 2023-11-28 NOTE — Telephone Encounter (Signed)
 Chief Complaint: Abd pain Symptoms: Abd pain Frequency: Ongoing about 1 year Pertinent Negatives: Patient denies fever, N/V Disposition: [] ED /[] Urgent Care (no appt availability in office) / [] Appointment(In office/virtual)/ [x]  [facility]/ [] Home Care/ [] Refused Recommended Disposition /[] [facility]/ []  Follow-up with PCP Additional Notes: Pt reports she has been experiencing right lower abd pain ongoing intermittently for about 1 year. She notes that she has been seen by PCP for this prior and he advised to return if not improved to follow up. Pt reports it has not improved and she feels she needs to follow up to see if there is anything that can be done to help reduce the pain. She reports it is most often with specific movements such as bending or twisting. She notes it only last a few seconds and is 8/10. Pt denies N/V, diarrhea, fever. OV scheduled by agent for next week, this RN did not find cause to move appt up. This RN educated pt and dtr on new-worsening symptoms, when to call back/seek emergent care. Pt and dtr verbalized understanding and agree to plan.   Copied from CRM [id number]. Topic: Clinical - Pink Word Triage >> Nov 28, 2023  4:26 PM Corin V wrote: Reason for Triage: Patient's daughter called to make appointment and after mentioned that patient is having pain in her abdomen on the lower right side. This has been ongoing for about a year. Patient is wondering if it is related to lower back issues as it acts up more when she moves certain ways. [name] declined transfer to nurse triage and stated that patient is napping but would be happy to talk to nursing later tonight or in the morning. Reason for Disposition  Abdominal pain is a chronic symptom (recurrent or ongoing AND present > 4 weeks)  Answer Assessment - Initial Assessment Questions 1. LOCATION: Where does it hurt?      Right lower abd 2. RADIATION: Does the pain shoot anywhere else? (e.g.,  chest, back)     No 3. ONSET: When did the pain begin? (e.g., minutes, hours or days ago)      About 1 year ago 4. SUDDEN: Gradual or sudden onset?     Intermittent, comes and goes 5. PATTERN Does the pain come and go, or is it constant?    - If it comes and goes: How long does it last? Do you have pain now?     (Note: Comes and goes means the pain is intermittent. It goes away completely between bouts.)    - If constant: Is it getting better, staying the same, or getting worse?      (Note: Constant means the pain never goes away completely; most serious pain is constant and gets worse.)      Comes and goes 6. SEVERITY: How bad is the pain?  (e.g., Scale 1-10; mild, moderate, or severe)    - MILD (1-3): Doesn't interfere with normal activities, abdomen soft and not tender to touch.     - MODERATE (4-7): Interferes with normal activities or awakens from sleep, abdomen tender to touch.     - SEVERE (8-10): Excruciating pain, doubled over, unable to do any normal activities.       8/10, momentary pain 7. RECURRENT SYMPTOM: Have you ever had this type of stomach pain before? If Yes, ask: When was the last time? and What happened that time?      Ongoing, comes and goes 8. CAUSE: What do you think is causing  the stomach pain?     Unknown 9. RELIEVING/AGGRAVATING FACTORS: What makes it better or worse? (e.g., antacids, bending or twisting motion, bowel movement)     Worse with some movements, twisting, bending over 10. OTHER SYMPTOMS: Do you have any other symptoms? (e.g., back pain, diarrhea, fever, urination pain, vomiting)       None  Protocols used: Abdominal Pain - Female-A-AH

## 2023-11-29 DIAGNOSIS — L918 Other hypertrophic disorders of the skin: Secondary | ICD-10-CM | POA: Diagnosis not present

## 2023-11-29 DIAGNOSIS — D225 Melanocytic nevi of trunk: Secondary | ICD-10-CM | POA: Diagnosis not present

## 2023-11-29 DIAGNOSIS — D2272 Melanocytic nevi of left lower limb, including hip: Secondary | ICD-10-CM | POA: Diagnosis not present

## 2023-11-29 DIAGNOSIS — L821 Other seborrheic keratosis: Secondary | ICD-10-CM | POA: Diagnosis not present

## 2023-11-29 DIAGNOSIS — D2262 Melanocytic nevi of left upper limb, including shoulder: Secondary | ICD-10-CM | POA: Diagnosis not present

## 2023-11-29 DIAGNOSIS — D2271 Melanocytic nevi of right lower limb, including hip: Secondary | ICD-10-CM | POA: Diagnosis not present

## 2023-11-29 DIAGNOSIS — R202 Paresthesia of skin: Secondary | ICD-10-CM | POA: Diagnosis not present

## 2023-11-29 DIAGNOSIS — D2261 Melanocytic nevi of right upper limb, including shoulder: Secondary | ICD-10-CM | POA: Diagnosis not present

## 2023-11-29 NOTE — Telephone Encounter (Signed)
 Spoke to pt's daughter, Melinda, and offered an appt today or tomorrow. She stated the issue was not too bad and they both have other appts today and tomorrow.

## 2023-12-07 ENCOUNTER — Encounter: Payer: Self-pay | Admitting: Internal Medicine

## 2023-12-07 ENCOUNTER — Ambulatory Visit: Payer: Medicare PPO | Admitting: Internal Medicine

## 2023-12-07 VITALS — BP 122/80 | HR 66 | Ht 62.5 in

## 2023-12-07 DIAGNOSIS — R251 Tremor, unspecified: Secondary | ICD-10-CM | POA: Diagnosis not present

## 2023-12-07 DIAGNOSIS — R1031 Right lower quadrant pain: Secondary | ICD-10-CM

## 2023-12-07 NOTE — Progress Notes (Signed)
Subjective:    Patient ID: [name], female    DOB: 07-30-1931, 89 y.o.   MRN: [id number]  HPI Here with daughter [name] with several concerns  Having some right low flank pain--daughter got concerned Worried her one day about appendicitis--but goes back over a year (but intermittent) Other times, she has sense that it came from her back (wondered about shingles--but not in that spot) Can put up with it No relationship to eating Not clearly related to movement or walking--but may notice at times when sitting down No N/V Bowels move okay--not constipated   Also has noted some tremor in hands--not all the time More on left May go back to when carpal tunnel was done Slow walk with rollator Handwriting a bit shaky now--has to be slow Daughter doesn't note slow movements  Current Outpatient Medications on File Prior to Visit  Medication Sig Dispense Refill   ACCU-CHEK SMARTVIEW test strip   3   acetaminophen (TYLENOL) 500 MG tablet Take 500 mg by mouth every 4 (four) hours as needed.     albuterol (VENTOLIN HFA) 108 (90 Base) MCG/ACT inhaler Inhale 2 puffs into the lungs every 6 (six) hours as needed. 18 g 1   ALOE PO Take 1 capsule by mouth daily.     ALPRAZolam (XANAX) 0.25 MG tablet TAKE 1 TABLET(0.25 MG) BY MOUTH TWICE DAILY AS NEEDED FOR ANXIETY 180 tablet 0   aspirin 81 MG tablet Take 81 mg by mouth daily.     atorvastatin (LIPITOR) 20 MG tablet TAKE 1 TABLET BY MOUTH EVERY DAY 90 tablet 3   B-D ULTRA-FINE 33 LANCETS MISC Use to test blood sugar twice daily dx: E11.9 200 each 3   calcitonin, salmon, (MIACALCIN/FORTICAL) 200 UNIT/ACT nasal spray USE 1 SPRAY NASALLY(ALTERNATING NOSTRILS) EVERY DAY 3.7 mL 11   cetirizine (ZYRTEC) 5 MG tablet Take 10 mg by mouth daily.     COLLAGEN PO Take by mouth daily.     CORAL CALCIUM PO Take by mouth daily.     CRANBERRY PO Take by mouth daily.     CYANOCOBALAMIN PO Take by mouth daily.     glucosamine-chondroitin 500-400 MG tablet  Take 1 tablet by mouth daily.     hydrocortisone 2.5 % cream Apply topically 3 (three) times daily as needed. 28 g 3   insulin glargine (LANTUS) 100 UNIT/ML Solostar Pen Inject 40 Units into the skin at bedtime.     Insulin Pen Needle (B-D UF III MINI PEN NEEDLES) 31G X 5 MM MISC USE 1 TO 2 TIMES DAILY AS DIRECTED 200 each 4   KRILL OIL PO Take by mouth.     levothyroxine (SYNTHROID, LEVOTHROID) 100 MCG tablet Take 100 mcg by mouth daily before breakfast.     MAGNESIUM PO Take 1 tablet by mouth in the morning and at bedtime.     meclizine (ANTIVERT) 25 MG tablet Take 1 tablet (25 mg total) by mouth 3 (three) times daily as needed. 90 tablet 0   metoprolol succinate (TOPROL-XL) 50 MG 24 hr tablet TAKE 1 TABLET(50 MG) BY MOUTH DAILY 90 tablet 3   montelukast (SINGULAIR) 10 MG tablet TAKE 1 TABLET(10 MG) BY MOUTH AT BEDTIME 90 tablet 3   Multiple Vitamin (MULTIVITAMIN) tablet Take 1 tablet by mouth daily. Centrum Silver     Multiple Vitamins-Minerals (OCUVITE PO) Take 1 tablet by mouth daily.     NOVOLOG FLEXPEN 100 UNIT/ML FlexPen 16 units 1st meal 22 units 2nd meal  omeprazole (PRILOSEC) 20 MG capsule Take 1 capsule (20 mg total) by mouth 2 (two) times daily before a meal. 180 capsule 3   Spacer/Aero-Holding Chambers (VALVED HOLDING CHAMBER) DEVI      TURMERIC PO Take 400 mg by mouth daily.     valsartan (DIOVAN) 160 MG tablet Take 1 tablet (160 mg total) by mouth daily. 90 tablet 3   vitamin C (ASCORBIC ACID) 500 MG tablet Take 500 mg by mouth daily.     VITAMIN D PO Take by mouth daily.     furosemide (LASIX) 20 MG tablet Take 1 tablet (20 mg total) by mouth 3 (three) times a week. 45 tablet 2   No current facility-administered medications on file prior to visit.    Allergies  Allergen Reactions   Gabapentin Swelling   Cefdinir     REACTION: unspecified   Ciprofloxacin     rash   Clarithromycin     REACTION: weird dreams (on prednisone at the same time but has tolerated this in  the past)   Diclofenac Dermatitis    Topical only  Causes burning to site   Miglitol     REACTION: unspecified   Niacin     REACTION: unspecified   Qvar [Beclomethasone] Other (See Comments)    Feels funny Feels funny   Jardiance [Empagliflozin] Other (See Comments)    Yeast infection    Metformin And Related Diarrhea    Past Medical History:  Diagnosis Date   Allergy    Asthma    Diabetes mellitus    Diastolic dysfunction    a. 06/2019 Echo: EF 60-65%, no rwma, GR1 DD. Mild MR. Nl RV fxn.   Essential hypertension    GERD (gastroesophageal reflux disease)    Hyperlipidemia    Mixed hyperlipidemia    Osteopenia    PAF (paroxysmal atrial fibrillation) (HCC)    a. Dx in late 1990's - no known recurrence.   Sleep disorder    Thyroid disease     Past Surgical History:  Procedure Laterality Date   CARPAL TUNNEL RELEASE Right    CERVICAL CONE BIOPSY  07/16/1959   CHOLECYSTECTOMY     NASAL POLYP SURGERY  11/15/1999   STRABISMUS SURGERY  1946 / 1967   VAGINAL DELIVERY     x2    Family History  Problem Relation Age of Onset   Asthma Mother    Coronary artery disease Father     Social History   Socioeconomic History   Marital status: Widowed    Spouse name: Not on file   Number of children: 2   Years of education: Not on file   Highest education level: Not on file  Occupational History    Employer: RETIRED  Tobacco Use   Smoking status: Never    Passive exposure: Never   Smokeless tobacco: Never  Vaping Use   Vaping status: Never Used  Substance and Sexual Activity   Alcohol use: Not Currently    Alcohol/week: 1.0 standard drink of alcohol    Types: 1 Glasses of wine per week    Comment: 1 glass of wine-rare   Drug use: No   Sexual activity: Not Currently  Other Topics Concern   Not on file  Social History Narrative   Widowed 3/23      Has living will   Not sure about formal POA --but requests daughter [name] (and son [name]) to be health care POAs    Would accept resuscitation attempts   no  feeding tubes if cognitively unaware   Social Drivers of Health   Financial Resource Strain: Low Risk  (04/09/2022)   Received from [facility], [facility]   Overall Financial Resource Strain (CARDIA)    Difficulty of Paying Living Expenses: Not hard at all  Food Insecurity: No Food Insecurity (04/09/2022)   Received from [facility], [facility]   Hunger Vital Sign    Worried About Running Out of Food in the Last Year: Never true    Ran Out of Food in the Last Year: Never true  Transportation Needs: No Transportation Needs (04/09/2022)   Received from [facility], [facility]   [facility] - Transportation    Lack of Transportation (Medical): No    Lack of Transportation (Non-Medical): No  Physical Activity: Not on file  Stress: Not on file  Social Connections: Not on file  Intimate Partner Violence: Not on file   Review of Systems Some congestion for a couple of days Not sick     Objective:   Physical Exam Constitutional:      Appearance: She is well-developed.  Pulmonary:     Effort: Pulmonary effort is normal.     Breath sounds: Normal breath sounds. No wheezing or rales.  Abdominal:     General: There is no distension.     Palpations: Abdomen is soft.     Tenderness: There is no guarding or rebound.     Comments: Slight vague tenderness in RLQ  Musculoskeletal:     Comments: Some decreased internal rotation with pain in right hip--but that is different than her pain complaint  Neurological:     Mental Status: She is alert.     Comments: Normal tone No striking tremor            Assessment & Plan:

## 2023-12-07 NOTE — Assessment & Plan Note (Signed)
Reassured Doesn't seem to be early Parkinson's--and mild anyway Observe only

## 2023-12-07 NOTE — Assessment & Plan Note (Signed)
Doesn't seem to be intra-abdominal Reassured Not area of her past shingles May be in abdominal wall musculature

## 2023-12-19 DIAGNOSIS — E113293 Type 2 diabetes mellitus with mild nonproliferative diabetic retinopathy without macular edema, bilateral: Secondary | ICD-10-CM | POA: Diagnosis not present

## 2023-12-19 DIAGNOSIS — Z794 Long term (current) use of insulin: Secondary | ICD-10-CM | POA: Diagnosis not present

## 2023-12-19 DIAGNOSIS — I152 Hypertension secondary to endocrine disorders: Secondary | ICD-10-CM | POA: Diagnosis not present

## 2023-12-19 DIAGNOSIS — E063 Autoimmune thyroiditis: Secondary | ICD-10-CM | POA: Diagnosis not present

## 2023-12-19 DIAGNOSIS — E1159 Type 2 diabetes mellitus with other circulatory complications: Secondary | ICD-10-CM | POA: Diagnosis not present

## 2023-12-21 DIAGNOSIS — Z794 Long term (current) use of insulin: Secondary | ICD-10-CM | POA: Diagnosis not present

## 2023-12-21 DIAGNOSIS — E113293 Type 2 diabetes mellitus with mild nonproliferative diabetic retinopathy without macular edema, bilateral: Secondary | ICD-10-CM | POA: Diagnosis not present

## 2023-12-21 NOTE — Progress Notes (Signed)
 [name] is a 89 y.o. female seen in follow up. She is here today with her daughter [name]. She was last seen on 06/19/2023.  Type 2 diabetes. Her Hb A1c is 7%. She has an Accu-Chek glucometer. Meter was downloaded and reviewed. She checks her sugar 2 times daily. Her sugars are in the range of 78 - 179 mg/dl over the last 2 weeks. They are concerned about risks of lows. For her diabetes she is now taking Lantus  PEN 40 units QHS and NovoLog PEN 16 units before lunch and 22 units before supper.    Diabetes is complicated by stable background NPDR. Last eye exam was in 03/2023.  Medications previously tried for diabetes: Metformin - stopped 04/2022 due to intolerable diarrhea Jardiance - stopped 05/2022 due to genital yeast infection  Hypothyroidism. Takes levothyroxine  daily. No missed doses.      Past Medical History:  Diagnosis Date  . Asthma (HHS-HCC)   . Atrial fibrillation (CMS/HHS-HCC)   . COVID-19 09/2021  . GERD (gastroesophageal reflux disease)   . Hyperlipidemia   . Hypothyroidism   . Lumbago   . Osteopenia   . Seasonal allergies   . Shingles   . Sleep disorder   . Type 2 diabetes mellitus (CMS/HHS-HCC)      Outpatient Medications Marked as Taking for the 12/21/23 encounter (Office Visit) with Solum, Anna Melissa, MD  Medication Sig Dispense Refill  . acetaminophen (TYLENOL) 500 MG tablet Take 500 mg by mouth every 8 (eight) hours as needed    . albuterol  90 mcg/actuation inhaler     . ALOE VERA ORAL Take by mouth    . ALPRAZolam  (XANAX ) 0.25 MG tablet Take 0.25 mg by mouth nightly as needed for Sleep    . ascorbic acid, vitamin C, (VITAMIN C) 500 MG tablet Take 500 mg by mouth once daily    . aspirin 81 MG EC tablet Take 81 mg by mouth once daily      . atorvastatin  (LIPITOR) 20 MG tablet Take 20 mg by mouth once daily.    . Bacillus coagulans (PROBIOTIC, B. COAGULANS, ORAL) Take by mouth    . calcitonin, salmon, (MIACALCIN) 200 unit/actuation nasal spray Place 1  spray into the left nostril once daily    . calcium  carbonate (CALCIUM  500 ORAL) Take 1,000 mg by mouth    . cetirizine (ZYRTEC) 5 MG tablet Take 5 mg by mouth once daily    . cranberry fruit extract (CRANBERRY CONCENTRATE ORAL) Take by mouth    . cyanocobalamin , vitamin B-12, (VITAMIN B-12 ORAL) Take by mouth    . fluticasone  (FLONASE ) 50 mcg/actuation nasal spray Place 2 sprays into both nostrils once daily    . fluticasone  furoate (ARNUITY ELLIPTA  INHAL) Inhale into the lungs    . glucosamine sulfate (GLUCOSAMINE) 500 mg Tab Take by mouth    . Herbal Supplement Herbal Name tumeric 400 mg daily    . insulin  ASPART (NOVOLOG FLEXPEN) pen injector (concentration 100 units/mL) Inject 16 units before lunch. Inject 22 units before supper. 45 mL 3  . insulin  GLARGINE (LANTUS  SOLOSTAR U-100 INSULIN ) pen injector (concentration 100 units/mL) Inject 40 Units subcutaneously at bedtime 45 mL 3  . levothyroxine  (SYNTHROID ) 100 MCG tablet Take 1 tablet (100 mcg total) by mouth every morning before breakfast (0630) 30 TO 60 MINUTES BEFORE BREAKFAST ON AN EMPTY STOMACH AND WITH A GLASS OF WATER 90 tablet 1  . loperamide HCl (IMODIUM A-D ORAL) Take 1 tablet by mouth once daily as  needed    . MAGNESIUM  ORAL Take 1 tablet by mouth 2 (two) times daily 250 mg bid    . metoprolol  succinate (TOPROL -XL) 50 MG XL tablet 50 mg once daily    . montelukast  (SINGULAIR ) 10 mg tablet TAKE 1 TABLET BY MOUTH EVERY NIGHT AT BEDTIME    . multivitamin tablet Take 1 tablet by mouth once daily    . omeprazole  (PRILOSEC) 20 MG DR capsule Take 20 mg by mouth 2 (two) times daily before meals    . pen needle, diabetic (BD ULTRA-FINE MINI PEN NEEDLE) 31 gauge x 3/16 needle Use 3 times a day to inject insulin  300 each 1  . valsartan  (DIOVAN ) 160 MG tablet Take 160 mg by mouth once daily    . ZINC ACETATE ORAL Take by mouth    . [DISCONTINUED] insulin  ASPART (NOVOLOG FLEXPEN) pen injector (concentration 100 units/mL) Inject 16 units  before lunch. Inject 22 units before supper. 45 mL 3  . [DISCONTINUED] insulin  GLARGINE (LANTUS  SOLOSTAR U-100 INSULIN ) pen injector (concentration 100 units/mL) Inject 40 Units subcutaneously at bedtime 45 mL 3    Physical Exam BP 130/64   Pulse 71   Ht 157.5 cm (5' 2)   Wt 99.4 kg (219 lb 3.2 oz)   SpO2 98%   BMI 40.09 kg/m   GEN: well developed female in NAD. SKIN: A bilateral bare foot exam shows no ulcerations or calluses.  EXT: No peripheral edema PSYC: alert and oriented, good insight.  Labs Ancillary Orders on 12/19/2023  Component Date Value Ref Range Status  . Glucose 12/19/2023 152 (H)  70 - 110 mg/dL Final  . Sodium 97/95/7974 133 (L)  136 - 145 mmol/L Final  . Potassium 12/19/2023 4.7  3.6 - 5.1 mmol/L Final  . Chloride 12/19/2023 99  97 - 109 mmol/L Final  . Carbon Dioxide (CO2) 12/19/2023 27.7  22.0 - 32.0 mmol/L Final  . Calcium  12/19/2023 9.3  8.7 - 10.3 mg/dL Final  . Urea Nitrogen (BUN) 12/19/2023 22  7 - 25 mg/dL Final  . Creatinine 97/95/7974 0.8  0.6 - 1.1 mg/dL Final  . Glomerular Filtration Rate (eGFR) 12/19/2023 70  >60 mL/min/1.73sq m Final   CKD-EPI (2021) does not include patient's race in the calculation of eGFR.  Monitoring changes of plasma creatinine and eGFR over time is useful for monitoring kidney function.   Interpretive Ranges for eGFR (CKD-EPI 2021):  eGFR:       >60 mL/min/1.73 sq. m - Normal eGFR:       30-59 mL/min/1.73 sq. m - Moderately Decreased eGFR:       15-29 mL/min/1.73 sq. m  - Severely Decreased eGFR:       < 15 mL/min/1.73 sq. m  - Kidney Failure    Note: These eGFR calculations do not apply in acute situations when eGFR is changing rapidly or patients on dialysis.  SABRA Dire Ratio 12/19/2023 27.5 (H)  6.0 - 20.0 Final  . Anion Gap w/K 12/19/2023 11.0  6.0 - 16.0 Final  . Thyroid  Stimulating Hormone (TSH) 12/19/2023 4.354  0.450-5.330 uIU/ml uIU/mL Final   Reference Range for Pregnant Females >= 18 yrs old: Normal Range  for 1st trimester: 0.05-3.70 ulU/ml Normal Range for 2nd trimester: 0.31-4.35 ulU/ml  . Hemoglobin A1C 12/19/2023 7.0 (H)  4.2 - 5.6 % Final  . Average Blood Glucose (Calc) 12/19/2023 154  mg/dL Final    Assessment 1. Type 2 diabetes mellitus with both eyes affected by mild nonproliferative retinopathy without macular  edema, with long-term current use of insulin  (CMS/HHS-HCC)     Plan  - Diabetes control improved from prior.  Her target Hb A1c is <7.5%. - Continue Lantus  insulin  40 units qhs.  - Continue NovoLog insulin  as she is doing: 16 units before lunch and 22 units before supper. - She has not been able to tolerate metformin and/or SGLT-2i. At her advanced age, I would be hesitant to suggest a GLP-1 RA given frequent nausea as a SE.  - Encouraged her to continue to monitor sugars BID.  Bring glucometer to each follow up appt.  - Discussed use of CGM. Advised patient to consider use of the FreeStyle Libre CGM. Discussed what this device is, how it can be used, and limitations. Patient agreed. I assisted them to get on the wifi, then get the [place] 3 app, the place a Libre 3+ sensor and sync to the app.  - Will place referral for nutritionist for diabetes, as they have requested. - OK to have a bedtime snack of crackers and cheese if bedtime sugar is < 100 mg/dl. - Continue regular eye exams.  - Continue levothyroxine  as she is doing.  - Anticipate follow up in 6 months.  They request that in future we call and speak with her daughter [name] at [phone] with any phone communication, if needed.   I spent a total of 43 minutes in both face-to-face and non-face-to-face activities, excluding procedures performed, for this visit on the date of this encounter.

## 2024-01-01 ENCOUNTER — Ambulatory Visit: Payer: Self-pay | Admitting: Internal Medicine

## 2024-01-01 NOTE — Telephone Encounter (Signed)
Spoke to pt's daughter per DPR. 

## 2024-01-01 NOTE — Telephone Encounter (Signed)
Chief Complaint: Decrease in alertness Symptoms: difficult to wake up  Frequency: Friday  Pertinent Negatives: Patient denies current symptoms  Disposition: [] ED /[] Urgent Care (no appt availability in office) / [] Appointment(In office/virtual)/ []  [facility]/ [x] Home Care/ [] Refused Recommended Disposition /[] [facility]/ []  Follow-up with PCP Additional Notes: Patient's daughter stated that on Friday evening the patient fell asleep in the recliner chair. When she went to wake the patient up it was difficult to arouse the patient. Patient stated she could hear her daughter calling her name but she could not respond, after a few moments the patient woke up and was behaving normally. Patient states she feels fine and is Alert and oriented x3. Patient's daughter wanted to know if PCP recommends an appointment for the episode. Care advice was given and informed patient I would forward to PCP for additional recommendations. Daughter is also asking for a referral to Urology at [facility] for the patient to establish care. Copied from CRM [id number]. Topic: Clinical - Red Word Triage >> Jan 01, 2024  4:07 PM Almira Coaster wrote: Red Word that prompted transfer to Nurse Triage: Patient's daughter is concerned with stroke symptoms, patient's daughter stated that last Friday her mother fell asleep on the couch and while trying to wake her up patient was not responding, eyes were open but not responsive. It took about five minuets before patient was alert. Reason for Disposition  [1] Numbness or tingling on both sides of body AND [2] is a new symptom present < 24 hours  Answer Assessment - Initial Assessment Questions 1. SYMPTOM: "What is the main symptom you are concerned about?" (e.g., weakness, numbness)     Change in level of alertness  2. ONSET: "When did this start?" (minutes, hours, days; while sleeping)     Friday  3. LAST NORMAL: "When was the last time you (the patient) were  normal (no symptoms)?"     Friday morning  4. PATTERN "Does this come and go, or has it been constant since it started?"  "Is it present now?"     Come and go  5. CARDIAC SYMPTOMS: "Have you had any of the following symptoms: chest pain, difficulty breathing, palpitations?"     Diabetic  6. NEUROLOGIC SYMPTOMS: "Have you had any of the following symptoms: headache, dizziness, vision loss, double vision, changes in speech, unsteady on your feet?"     No  7. OTHER SYMPTOMS: "Do you have any other symptoms?"     I just was in a deep sleep  Protocols used: Neurologic Deficit-A-AH

## 2024-01-03 ENCOUNTER — Other Ambulatory Visit: Payer: Self-pay | Admitting: Internal Medicine

## 2024-01-04 NOTE — Telephone Encounter (Signed)
Last filled 09-29-23 #180 Last OV 12-07-23 No Future OV  [id number] - [place], [place] - [address]

## 2024-01-08 DIAGNOSIS — E113393 Type 2 diabetes mellitus with moderate nonproliferative diabetic retinopathy without macular edema, bilateral: Secondary | ICD-10-CM | POA: Diagnosis not present

## 2024-01-08 DIAGNOSIS — H35379 Puckering of macula, unspecified eye: Secondary | ICD-10-CM | POA: Diagnosis not present

## 2024-01-08 DIAGNOSIS — H26492 Other secondary cataract, left eye: Secondary | ICD-10-CM | POA: Diagnosis not present

## 2024-01-08 LAB — HM DIABETES EYE EXAM

## 2024-02-01 ENCOUNTER — Telehealth: Payer: Self-pay | Admitting: Internal Medicine

## 2024-02-01 NOTE — Telephone Encounter (Signed)
 Phone call with daughter to discuss urine micral lab error issue Discussed that her elevated urinary albumiin is very mild (especially for a 89 year old) and the valsartan helps protect her kidneys (and she still has normal GFR)  Discussed that I would not recommend adding SGLT-2 inhibitor

## 2024-02-12 ENCOUNTER — Other Ambulatory Visit: Payer: Self-pay | Admitting: Internal Medicine

## 2024-02-15 DIAGNOSIS — H903 Sensorineural hearing loss, bilateral: Secondary | ICD-10-CM | POA: Diagnosis not present

## 2024-02-15 DIAGNOSIS — H6123 Impacted cerumen, bilateral: Secondary | ICD-10-CM | POA: Diagnosis not present

## 2024-02-16 DIAGNOSIS — H26492 Other secondary cataract, left eye: Secondary | ICD-10-CM | POA: Diagnosis not present

## 2024-02-23 ENCOUNTER — Ambulatory Visit: Payer: Medicare PPO | Attending: Cardiovascular Disease | Admitting: Cardiovascular Disease

## 2024-02-23 ENCOUNTER — Encounter: Payer: Self-pay | Admitting: Cardiovascular Disease

## 2024-02-23 VITALS — BP 110/54 | HR 66 | Ht 62.0 in | Wt 219.0 lb

## 2024-02-23 DIAGNOSIS — I700 Atherosclerosis of aorta: Secondary | ICD-10-CM | POA: Diagnosis not present

## 2024-02-23 DIAGNOSIS — E871 Hypo-osmolality and hyponatremia: Secondary | ICD-10-CM | POA: Diagnosis not present

## 2024-02-23 DIAGNOSIS — E782 Mixed hyperlipidemia: Secondary | ICD-10-CM

## 2024-02-23 DIAGNOSIS — E1169 Type 2 diabetes mellitus with other specified complication: Secondary | ICD-10-CM | POA: Diagnosis not present

## 2024-02-23 DIAGNOSIS — I480 Paroxysmal atrial fibrillation: Secondary | ICD-10-CM

## 2024-02-23 DIAGNOSIS — I10 Essential (primary) hypertension: Secondary | ICD-10-CM | POA: Diagnosis not present

## 2024-02-23 NOTE — Progress Notes (Signed)
 Cardiology Office Note  Date:  02/23/2024   ID:  [name], DOB 1930/12/07, MRN [id number]  PCP:  [name], MD   Chief Complaint  Patient presents with   6 month follow up     Patient c/o shortness of breath with walking a long distance.     HPI:  [name] is a 89 y.o. female with a hx of paroxysmal atrial fibrillation. diagnosed 23 years ago, age 25 In hospital ,lasted 1 week,  none since then ("heart was feeling really weird") chadsvasc 4, on asa, does not want NOAC HTN Hyperlipidemia DM II, followed by endocrine Who presents for routine follow-up of her palpitations/atrial fibrillation  Last seen in clinic by myself 10/24  Lives with daughter since husband died Presenting today with her daughter  Weight on last clinic visit 212 Reported worsening leg swelling and abdominal distention, took Lasix as needed High fluid and salt intake Weight up to 219 today Up from 198 since 3/24 Up from 185 in 11/23  Checks sugar twice daily On Lantus, NovoLog Diabetic retinopathy Intolerance to metformin, Jardiance Has been referred to nutrition noticed  Lab work reviewed Creatinine 0.8 BUN 22 A1c 7 Total cholesterol 133 LDL 51  EKG personally reviewed by myself on todays visit EKG Interpretation Date/Time:  Friday February 23 2024 15:12:08 EDT Ventricular Rate:  66 PR Interval:  188 QRS Duration:  96 QT Interval:  428 QTC Calculation: 448 R Axis:   5  Text Interpretation: Normal sinus rhythm Low voltage QRS Incomplete right bundle branch block When compared with ECG of 23-Aug-2023 16:52, No significant change was found Confirmed by Julien Nordmann [phone]) on 02/23/2024 3:15:04 PM   Other past medical history reviewed History of falls Uses a walker for ambulation  lost her husband last year, aspiration PNA, sepsis Passed in his sleep  In hospital 5/23, diarrhea from high dose metformin  Carpel tunnel pain bilaterally, had surgery on 1 side  Prior  dizzy episodes 2020 CT and MRI head Severe stenosis of right posterior artery Diffuse atherosclerosis.  On aspirin  Echo 06/2016: Normal EF >55%, normal LA size Event monitor 06/2016: NSR, no atrial fib  episode of chest pain, in the ED 07/2016  CT chest  2015: Atherosclerotic calcification aorta and minimally in coronary arteries.   PMH:   has a past medical history of Allergy, Asthma, Diabetes mellitus, Diastolic dysfunction, Essential hypertension, GERD (gastroesophageal reflux disease), Hyperlipidemia, Mixed hyperlipidemia, Osteopenia, PAF (paroxysmal atrial fibrillation) (HCC), Sleep disorder, and Thyroid disease.  PSH:    Past Surgical History:  Procedure Laterality Date   CARPAL TUNNEL RELEASE Right    CERVICAL CONE BIOPSY  07/16/1959   CHOLECYSTECTOMY     NASAL POLYP SURGERY  11/15/1999   STRABISMUS SURGERY  1946 / 1967   VAGINAL DELIVERY     x2    Current Outpatient Medications  Medication Sig Dispense Refill   ACCU-CHEK SMARTVIEW test strip   3   acetaminophen (TYLENOL) 500 MG tablet Take 500 mg by mouth every 4 (four) hours as needed.     albuterol (VENTOLIN HFA) 108 (90 Base) MCG/ACT inhaler Inhale 2 puffs into the lungs every 6 (six) hours as needed. 18 g 1   ALOE PO Take 1 capsule by mouth daily.     ALPRAZolam (XANAX) 0.25 MG tablet TAKE 1 TABLET(0.25 MG) BY MOUTH TWICE DAILY AS NEEDED FOR ANXIETY 180 tablet 0   ARNUITY ELLIPTA 100 MCG/ACT AEPB Inhale 1 puff into the lungs daily.  aspirin 81 MG tablet Take 81 mg by mouth daily.     atorvastatin (LIPITOR) 20 MG tablet TAKE 1 TABLET BY MOUTH EVERY DAY 90 tablet 3   B-D ULTRA-FINE 33 LANCETS MISC Use to test blood sugar twice daily dx: E11.9 200 each 3   calcitonin, salmon, (MIACALCIN/FORTICAL) 200 UNIT/ACT nasal spray USE 1 SPRAY NASALLY(ALTERNATING NOSTRILS) EVERY DAY 3.7 mL 11   cetirizine (ZYRTEC) 5 MG tablet Take 10 mg by mouth daily.     COLLAGEN PO Take by mouth daily.     CORAL CALCIUM PO Take by mouth  daily.     CRANBERRY PO Take by mouth daily.     CYANOCOBALAMIN PO Take by mouth daily.     furosemide (LASIX) 20 MG tablet Take 1 tablet (20 mg total) by mouth 3 (three) times a week. (Patient taking differently: Take 20 mg by mouth 3 (three) times a week. As needed) 45 tablet 2   glucosamine-chondroitin 500-400 MG tablet Take 1 tablet by mouth daily.     hydrocortisone 2.5 % cream Apply topically 3 (three) times daily as needed. 28 g 3   insulin glargine (LANTUS) 100 UNIT/ML Solostar Pen Inject 40 Units into the skin at bedtime.     Insulin Pen Needle (B-D UF III MINI PEN NEEDLES) 31G X 5 MM MISC USE 1 TO 2 TIMES DAILY AS DIRECTED 200 each 4   KRILL OIL PO Take by mouth.     levothyroxine (SYNTHROID, LEVOTHROID) 100 MCG tablet Take 100 mcg by mouth daily before breakfast.     MAGNESIUM PO Take 1 tablet by mouth in the morning and at bedtime.     metoprolol succinate (TOPROL-XL) 50 MG 24 hr tablet TAKE 1 TABLET(50 MG) BY MOUTH DAILY 90 tablet 3   montelukast (SINGULAIR) 10 MG tablet TAKE 1 TABLET(10 MG) BY MOUTH AT BEDTIME 90 tablet 3   Multiple Vitamin (MULTIVITAMIN) tablet Take 1 tablet by mouth daily. Centrum Silver     Multiple Vitamins-Minerals (OCUVITE PO) Take 1 tablet by mouth daily.     NOVOLOG FLEXPEN 100 UNIT/ML FlexPen 16 units 1st meal 22 units 2nd meal     omeprazole (PRILOSEC) 20 MG capsule Take 1 capsule (20 mg total) by mouth 2 (two) times daily before a meal. 180 capsule 3   Spacer/Aero-Holding Chambers (VALVED HOLDING CHAMBER) DEVI      TURMERIC PO Take 400 mg by mouth daily.     valsartan (DIOVAN) 160 MG tablet Take 1 tablet (160 mg total) by mouth daily. 90 tablet 3   valsartan (DIOVAN) 160 MG tablet Take 0.5 mg by mouth 2 (two) times daily.     vitamin C (ASCORBIC ACID) 500 MG tablet Take 500 mg by mouth daily.     VITAMIN D PO Take by mouth daily.     No current facility-administered medications for this visit.    Allergies:   Gabapentin, Cefdinir, Ciprofloxacin,  Clarithromycin, Diclofenac, Miglitol, Niacin, Qvar [beclomethasone], Jardiance [empagliflozin], and Metformin and related   Social History:  The patient  reports that she has never smoked. She has never been exposed to tobacco smoke. She has never used smokeless tobacco. She reports that she does not currently use alcohol after a past usage of about 1.0 standard drink of alcohol per week. She reports that she does not use drugs.   Family History:   family history includes Asthma in her mother; Coronary artery disease in her father.    Review of Systems: Review of Systems  Constitutional: Negative.   HENT: Negative.    Respiratory: Negative.    Cardiovascular: Negative.   Gastrointestinal: Negative.   Musculoskeletal: Negative.   Neurological: Negative.   Psychiatric/Behavioral: Negative.    All other systems reviewed and are negative.   PHYSICAL EXAM: VS:  BP (!) 110/54 (BP Location: Left Arm, Patient Position: Sitting, Cuff Size: Large)   Pulse 66   Ht 5\' 2"  (1.575 m)   Wt 219 lb (99.3 kg)   SpO2 96%   BMI 40.06 kg/m  , BMI Body mass index is 40.06 kg/m. Constitutional:  oriented to person, place, and time. No distress.  HENT:  Head: Grossly normal Eyes:  no discharge. No scleral icterus.  Neck: No JVD, no carotid bruits  Cardiovascular: Regular rate and rhythm, no murmurs appreciated, trace lower extremity edema Pulmonary/Chest: Clear to auscultation bilaterally, no wheezes or rails Abdominal: Soft.  no distension.  no tenderness.  Musculoskeletal: Normal range of motion Neurological:  normal muscle tone. Coordination normal. No atrophy Skin: Skin warm and dry Psychiatric: normal affect, pleasant   Recent Labs: 05/29/2023: BUN 23; Creatinine, Ser 0.78; Potassium 4.7; Sodium 134; TSH 3.52    Lipid Panel Lab Results  Component Value Date   CHOL 88 05/02/2022   HDL 48.20 05/02/2022   LDLCALC 7 05/02/2022   TRIG 164.0 (H) 05/02/2022    Wt Readings from Last 3  Encounters:  02/23/24 219 lb (99.3 kg)  09/18/23 215 lb (97.5 kg)  08/23/23 212 lb 3.2 oz (96.3 kg)     ASSESSMENT AND PLAN:  Paroxysmal atrial fibrillation (HCC) Maintaining normal sinus rhythm, no recent atrial fibrillation On asa, previously declined NOAC No recent symptoms concerning for paroxysmal episodes  Essential hypertension Blood pressure is well controlled on today's visit. No changes made to the medications.  Mixed hyperlipidemia Cholesterol is at goal on the current lipid regimen. No changes to the medications were made.  Cerebrovascular disease On aspirin, cholesterol at goal  Aortic atherosclerosis (HCC) Seen on CT scan Continue Lipitor  Diabetes: HGBA1C improving 6.9, managed by endocrine Moderate diet, on insulin Recommend walking program  Shortness of breath Mild significant symptoms, deconditioned Weight continues to trend upwards, meeting with a nutritionist, BMI 40  Stress/adjustment disorder Reports things are stable  Leg swelling Stable, trace edema recommended Lasix as needed for any worsening leg swelling  Morbid obesity Calorie restriction, walking program recommended She has been referred to nutritionist    Orders Placed This Encounter  Procedures   EKG 12-Lead     Signed, Dossie Arbour, M.D., Ph.D. 02/23/2024  [facility] [place], [place] [phone]

## 2024-02-23 NOTE — Patient Instructions (Signed)

## 2024-03-22 NOTE — Progress Notes (Unsigned)
  Start: *** end: *** Patient is here today *** Patient would like to learn *** Patient lives with ***.  *** shopping and cooking.  History includes:  *** Medications include:  *** Labs noted:   Lab Results  Component Value Date   HGBA1C 8.7 (H) 05/02/2022    Lab Results  Component Value Date   TSH 3.52 05/29/2023   Lab Results  Component Value Date   CHOL 88 05/02/2022   HDL 48.20 05/02/2022   LDLCALC 7 05/02/2022   LDLDIRECT 45.0 09/09/2021   TRIG 164.0 (H) 05/02/2022   CHOLHDL 2 05/02/2022

## 2024-03-25 ENCOUNTER — Encounter: Attending: Internal Medicine | Admitting: Dietician

## 2024-03-25 DIAGNOSIS — L570 Actinic keratosis: Secondary | ICD-10-CM | POA: Diagnosis not present

## 2024-03-25 DIAGNOSIS — Z713 Dietary counseling and surveillance: Secondary | ICD-10-CM | POA: Diagnosis not present

## 2024-03-25 DIAGNOSIS — I4891 Unspecified atrial fibrillation: Secondary | ICD-10-CM | POA: Diagnosis not present

## 2024-03-25 DIAGNOSIS — E11649 Type 2 diabetes mellitus with hypoglycemia without coma: Secondary | ICD-10-CM | POA: Diagnosis not present

## 2024-03-25 DIAGNOSIS — E039 Hypothyroidism, unspecified: Secondary | ICD-10-CM | POA: Insufficient documentation

## 2024-03-25 NOTE — Patient Instructions (Signed)
 Avoid skipped meals; consider a balanced snack in replace of a skipped meal

## 2024-04-01 ENCOUNTER — Other Ambulatory Visit: Payer: Self-pay

## 2024-04-01 MED ORDER — METOPROLOL SUCCINATE ER 50 MG PO TB24: ORAL_TABLET | ORAL | 3 refills | Status: AC

## 2024-04-03 ENCOUNTER — Other Ambulatory Visit: Payer: Self-pay | Admitting: Internal Medicine

## 2024-04-10 ENCOUNTER — Other Ambulatory Visit: Payer: Self-pay | Admitting: Internal Medicine

## 2024-04-17 DIAGNOSIS — E113293 Type 2 diabetes mellitus with mild nonproliferative diabetic retinopathy without macular edema, bilateral: Secondary | ICD-10-CM | POA: Diagnosis not present

## 2024-04-17 DIAGNOSIS — Z794 Long term (current) use of insulin: Secondary | ICD-10-CM | POA: Diagnosis not present

## 2024-04-23 DIAGNOSIS — E66813 Obesity, class 3: Secondary | ICD-10-CM | POA: Diagnosis not present

## 2024-04-23 DIAGNOSIS — Z794 Long term (current) use of insulin: Secondary | ICD-10-CM | POA: Diagnosis not present

## 2024-04-23 DIAGNOSIS — W010XXA Fall on same level from slipping, tripping and stumbling without subsequent striking against object, initial encounter: Secondary | ICD-10-CM | POA: Diagnosis not present

## 2024-04-23 DIAGNOSIS — E063 Autoimmune thyroiditis: Secondary | ICD-10-CM | POA: Diagnosis not present

## 2024-04-23 DIAGNOSIS — E113293 Type 2 diabetes mellitus with mild nonproliferative diabetic retinopathy without macular edema, bilateral: Secondary | ICD-10-CM | POA: Diagnosis not present

## 2024-06-06 ENCOUNTER — Other Ambulatory Visit: Payer: Self-pay | Admitting: Internal Medicine

## 2024-06-06 DIAGNOSIS — H903 Sensorineural hearing loss, bilateral: Secondary | ICD-10-CM | POA: Diagnosis not present

## 2024-06-06 DIAGNOSIS — H6123 Impacted cerumen, bilateral: Secondary | ICD-10-CM | POA: Diagnosis not present

## 2024-08-03 ENCOUNTER — Other Ambulatory Visit: Payer: Self-pay | Admitting: Cardiovascular Disease

## 2024-08-16 ENCOUNTER — Telehealth: Payer: Self-pay | Admitting: Cardiovascular Disease

## 2024-08-16 NOTE — Telephone Encounter (Signed)
 Spoke with daughter per DPR. Daughter reports that patient has had increased bilateral leg swelling that started last week. Patient has been taking Lasix  20 MG daily for 5 days. Patient has has improvement in swelling but continues to have leg swelling. Denies shortness of breath. Patient scheduled to be seen in clinic on 08/23/24.

## 2024-08-16 NOTE — Telephone Encounter (Signed)
 Pt c/o swelling/edema: STAT if pt has developed SOB within 24 hours  If swelling, where is the swelling located? Legs and feet   How much weight have you gained and in what time span? N/A  Have you gained 2 pounds in a day or 5 pounds in a week? N/A  Do you have a log of your daily weights (if so, list)? N/A  Are you currently taking a fluid pill? Yes   Are you currently SOB? No  Have you traveled recently in a car or plane for an extended period of time?

## 2024-08-21 ENCOUNTER — Emergency Department

## 2024-08-21 ENCOUNTER — Other Ambulatory Visit: Payer: Self-pay

## 2024-08-21 ENCOUNTER — Emergency Department: Admission: EM | Admit: 2024-08-21 | Discharge: 2024-08-21 | Disposition: A | Discharge status: Home / Self Care

## 2024-08-21 DIAGNOSIS — I6529 Occlusion and stenosis of unspecified carotid artery: Secondary | ICD-10-CM | POA: Diagnosis not present

## 2024-08-21 DIAGNOSIS — E039 Hypothyroidism, unspecified: Secondary | ICD-10-CM | POA: Insufficient documentation

## 2024-08-21 DIAGNOSIS — I159 Secondary hypertension, unspecified: Secondary | ICD-10-CM | POA: Diagnosis not present

## 2024-08-21 DIAGNOSIS — R600 Localized edema: Secondary | ICD-10-CM | POA: Insufficient documentation

## 2024-08-21 DIAGNOSIS — I161 Hypertensive emergency: Secondary | ICD-10-CM | POA: Diagnosis not present

## 2024-08-21 DIAGNOSIS — I672 Cerebral atherosclerosis: Secondary | ICD-10-CM | POA: Insufficient documentation

## 2024-08-21 DIAGNOSIS — E782 Mixed hyperlipidemia: Secondary | ICD-10-CM | POA: Diagnosis not present

## 2024-08-21 DIAGNOSIS — I491 Atrial premature depolarization: Secondary | ICD-10-CM | POA: Diagnosis not present

## 2024-08-21 DIAGNOSIS — R202 Paresthesia of skin: Secondary | ICD-10-CM | POA: Insufficient documentation

## 2024-08-21 DIAGNOSIS — I440 Atrioventricular block, first degree: Secondary | ICD-10-CM | POA: Insufficient documentation

## 2024-08-21 DIAGNOSIS — R001 Bradycardia, unspecified: Secondary | ICD-10-CM | POA: Diagnosis not present

## 2024-08-21 DIAGNOSIS — R200 Anesthesia of skin: Secondary | ICD-10-CM | POA: Diagnosis present

## 2024-08-21 DIAGNOSIS — Z8249 Family history of ischemic heart disease and other diseases of the circulatory system: Secondary | ICD-10-CM | POA: Insufficient documentation

## 2024-08-21 DIAGNOSIS — I10 Essential (primary) hypertension: Secondary | ICD-10-CM

## 2024-08-21 DIAGNOSIS — J45909 Unspecified asthma, uncomplicated: Secondary | ICD-10-CM | POA: Diagnosis not present

## 2024-08-21 DIAGNOSIS — E119 Type 2 diabetes mellitus without complications: Secondary | ICD-10-CM | POA: Insufficient documentation

## 2024-08-21 LAB — DIFFERENTIAL
Abs Immature Granulocytes: 0.04 K/uL (ref 0.00–0.07)
Basophils Absolute: 0 K/uL (ref 0.0–0.1)
Basophils Relative: 1 %
Eosinophils Absolute: 0.2 K/uL (ref 0.0–0.5)
Eosinophils Relative: 3 %
Immature Granulocytes: 1 %
Lymphocytes Relative: 32 %
Lymphs Abs: 2.4 K/uL (ref 0.7–4.0)
Monocytes Absolute: 0.8 K/uL (ref 0.1–1.0)
Monocytes Relative: 11 %
Neutro Abs: 3.9 K/uL (ref 1.7–7.7)
Neutrophils Relative %: 52 %

## 2024-08-21 LAB — COMPREHENSIVE METABOLIC PANEL WITH GFR
ALT: 19 U/L (ref 0–44)
AST: 24 U/L (ref 15–41)
Albumin: 3.3 g/dL — ABNORMAL LOW (ref 3.5–5.0)
Alkaline Phosphatase: 86 U/L (ref 38–126)
Anion gap: 7 (ref 5–15)
BUN: 19 mg/dL (ref 8–23)
CO2: 27 mmol/L (ref 22–32)
Calcium: 8.5 mg/dL — ABNORMAL LOW (ref 8.9–10.3)
Chloride: 100 mmol/L (ref 98–111)
Creatinine, Ser: 0.74 mg/dL (ref 0.44–1.00)
GFR, Estimated: >60 mL/min (ref >60)
Glucose, Bld: 90 mg/dL (ref 70–99)
Potassium: 4.6 mmol/L (ref 3.5–5.1)
Sodium: 134 mmol/L — ABNORMAL LOW (ref 135–145)
Total Bilirubin: 0.5 mg/dL (ref 0.0–1.2)
Total Protein: 6.4 g/dL — ABNORMAL LOW (ref 6.5–8.1)

## 2024-08-21 LAB — CBC
HCT: 34.2 % — ABNORMAL LOW (ref 36.0–46.0)
Hemoglobin: 11.4 g/dL — ABNORMAL LOW (ref 12.0–15.0)
MCH: 30.9 pg (ref 26.0–34.0)
MCHC: 33.3 g/dL (ref 30.0–36.0)
MCV: 92.7 fL (ref 80.0–100.0)
Platelets: 225 K/uL (ref 150–400)
RBC: 3.69 MIL/uL — ABNORMAL LOW (ref 3.87–5.11)
RDW: 13.3 % (ref 11.5–15.5)
WBC: 7.5 K/uL (ref 4.0–10.5)
nRBC: 0 % (ref 0.0–0.2)

## 2024-08-21 LAB — PROTIME-INR
INR: 1 (ref 0.8–1.2)
Prothrombin Time: 14.2 s (ref 11.4–15.2)

## 2024-08-21 LAB — APTT: aPTT: 28 s (ref 24–36)

## 2024-08-21 LAB — CBG MONITORING, ED: Glucose-Capillary: 95 mg/dL (ref 70–99)

## 2024-08-21 LAB — ETHANOL: Alcohol, Ethyl (B): <15 mg/dL (ref <15)

## 2024-08-21 MED ORDER — HYDRALAZINE HCL 20 MG/ML IJ SOLN
5.0000 mg | Freq: Once | INTRAMUSCULAR | Status: DC
  Filled 2024-08-21: qty 1

## 2024-08-21 MED ORDER — IOHEXOL 350 MG/ML SOLN
75.0000 mL | Freq: Once | INTRAVENOUS | Status: AC | PRN
  Administered 2024-08-21: 75 mL via INTRAVENOUS

## 2024-08-21 MED ORDER — HYDRALAZINE HCL 10 MG PO TABS: 10.0000 mg | ORAL_TABLET | Freq: Four times a day (QID) | ORAL | 0 refills | Status: AC | PRN

## 2024-08-21 MED ORDER — SODIUM CHLORIDE 0.9% FLUSH
3.0000 mL | Freq: Once | INTRAVENOUS | Status: AC
  Administered 2024-08-21: 3 mL via INTRAVENOUS

## 2024-08-21 MED ORDER — HYDRALAZINE HCL 10 MG PO TABS
10.0000 mg | ORAL_TABLET | Freq: Once | ORAL | Status: AC
  Administered 2024-08-21: 10 mg via ORAL
  Filled 2024-08-21: qty 1

## 2024-08-21 NOTE — Consult Note (Signed)
 NEUROLOGY CONSULT NOTE   Date of service: August 21, 2024 Patient Name: [name] MRN:  [id number] DOB:  [birth year] Chief Complaint: Facial numbness Requesting Provider: Nicholaus Rolland BRAVO, MD  History of Present Illness  [name] is a 89 y.o. female with hx of diabetes, hypertension, hyperlipidemia, paroxysmal atrial fibrillation(though none since 1990s) who presents with what she describes as an odd sensation with tingling of the left side of her face.  Due to this, a code stroke was activated and she was evaluated.  Though she did complain that hit the left side of her face did not feel quite right when tested to actual confrontational sensory testing she said that it felt symmetric in both sides.  She was evaluated with CT/CTA which were negative.  LKW: 1300 Modified rankin score: 0-Completely asymptomatic and back to baseline post- stroke IV Thrombolysis: No, mild symptoms EVT: No, no LVO NIHSS: Zero Past History   Past Medical History:  Diagnosis Date   Allergy    Asthma    Diabetes mellitus    Diastolic dysfunction    a. 06/2019 Echo: EF 60-65%, no rwma, GR1 DD. Mild MR. Nl RV fxn.   Essential hypertension    GERD (gastroesophageal reflux disease)    Hyperlipidemia    Mixed hyperlipidemia    Osteopenia    PAF (paroxysmal atrial fibrillation) (HCC)    a. Dx in late 1990's - no known recurrence.   Sleep disorder    Thyroid  disease     Past Surgical History:  Procedure Laterality Date   CARPAL TUNNEL RELEASE Right    CERVICAL CONE BIOPSY  07/16/1959   CHOLECYSTECTOMY     NASAL POLYP SURGERY  11/15/1999   STRABISMUS SURGERY  1946 / 1967   VAGINAL DELIVERY     x2    Family History: Family History  Problem Relation Age of Onset   Asthma Mother    Coronary artery disease Father     Social History  reports that she has never smoked. She has never been exposed to tobacco smoke. She has never used smokeless tobacco. She reports that she does not  currently use alcohol after a past usage of about 1.0 standard drink of alcohol per week. She reports that she does not use drugs.  Allergies  Allergen Reactions   Gabapentin  Swelling   Cefdinir     REACTION: unspecified   Ciprofloxacin     rash   Clarithromycin     REACTION: weird dreams (on prednisone  at the same time but has tolerated this in the past)   Diclofenac Dermatitis    Topical only  Causes burning to site   Miglitol     REACTION: unspecified   Niacin     REACTION: unspecified   Qvar  [Beclomethasone] Other (See Comments)    Feels funny Feels funny   Jardiance [Empagliflozin] Other (See Comments)    Yeast infection    Metformin And Related Diarrhea    Medications   Current Facility-Administered Medications:    hydrALAZINE (APRESOLINE) injection 5 mg, 5 mg, Intravenous, Once, Nicholaus Rolland E, MD  Current Outpatient Medications:    ACCU-CHEK SMARTVIEW test strip, , Disp: , Rfl: 3   acetaminophen (TYLENOL) 500 MG tablet, Take 500 mg by mouth every 4 (four) hours as needed., Disp: , Rfl:    albuterol  (VENTOLIN  HFA) 108 (90 Base) MCG/ACT inhaler, Inhale 2 puffs into the lungs every 6 (six) hours as needed., Disp: 18 g, Rfl: 1   ALOE PO, Take  1 capsule by mouth daily., Disp: , Rfl:    ALPRAZolam  (XANAX ) 0.25 MG tablet, TAKE 1 TABLET(0.25 MG) BY MOUTH TWICE DAILY AS NEEDED FOR ANXIETY, Disp: 180 tablet, Rfl: 0   aspirin 81 MG tablet, Take 81 mg by mouth daily., Disp: , Rfl:    atorvastatin  (LIPITOR) 20 MG tablet, TAKE 1 TABLET BY MOUTH EVERY DAY, Disp: 90 tablet, Rfl: 3   B-D ULTRA-FINE 33 LANCETS MISC, Use to test blood sugar twice daily dx: E11.9, Disp: 200 each, Rfl: 3   calcitonin, salmon, (MIACALCIN/FORTICAL) 200 UNIT/ACT nasal spray, INSTILL 1 SPRAY NASALLY(ALTERNATING NOSTRILS) EVERY DAY, Disp: 3.7 mL, Rfl: 11   cetirizine (ZYRTEC) 5 MG tablet, Take 10 mg by mouth daily., Disp: , Rfl:    COLLAGEN PO, Take by mouth daily., Disp: , Rfl:    CORAL CALCIUM  PO, Take  by mouth daily., Disp: , Rfl:    CRANBERRY PO, Take by mouth daily., Disp: , Rfl:    CYANOCOBALAMIN  PO, Take by mouth daily., Disp: , Rfl:    Fluticasone  Furoate (ARNUITY ELLIPTA ) 100 MCG/ACT AEPB, INHALE 1 PUFF INTO THE LUNGS DAILY, Disp: 30 each, Rfl: 11   furosemide  (LASIX ) 20 MG tablet, Take 1 tablet (20 mg total) by mouth 3 (three) times a week. (Patient taking differently: Take 20 mg by mouth 3 (three) times a week. As needed), Disp: 45 tablet, Rfl: 2   glucosamine-chondroitin 500-400 MG tablet, Take 1 tablet by mouth daily., Disp: , Rfl:    hydrocortisone  2.5 % cream, Apply topically 3 (three) times daily as needed., Disp: 28 g, Rfl: 3   insulin  glargine (LANTUS ) 100 UNIT/ML Solostar Pen, Inject 40 Units into the skin at bedtime., Disp: , Rfl:    Insulin  Pen Needle (B-D UF III MINI PEN NEEDLES) 31G X 5 MM MISC, USE 1 TO 2 TIMES DAILY AS DIRECTED, Disp: 200 each, Rfl: 4   KRILL OIL PO, Take by mouth., Disp: , Rfl:    levothyroxine  (SYNTHROID , LEVOTHROID) 100 MCG tablet, Take 100 mcg by mouth daily before breakfast., Disp: , Rfl:    MAGNESIUM  PO, Take 1 tablet by mouth in the morning and at bedtime., Disp: , Rfl:    metoprolol  succinate (TOPROL -XL) 50 MG 24 hr tablet, TAKE 1 TABLET(50 MG) BY MOUTH DAILY, Disp: 90 tablet, Rfl: 3   montelukast  (SINGULAIR ) 10 MG tablet, TAKE 1 TABLET(10 MG) BY MOUTH AT BEDTIME, Disp: 90 tablet, Rfl: 3   Multiple Vitamin (MULTIVITAMIN) tablet, Take 1 tablet by mouth daily. Centrum Silver, Disp: , Rfl:    Multiple Vitamins-Minerals (OCUVITE PO), Take 1 tablet by mouth daily., Disp: , Rfl:    NOVOLOG FLEXPEN 100 UNIT/ML FlexPen, 16 units 1st meal 22 units 2nd meal, Disp: , Rfl:    omeprazole  (PRILOSEC) 20 MG capsule, Take 1 capsule (20 mg total) by mouth 2 (two) times daily before a meal., Disp: 180 capsule, Rfl: 3   Spacer/Aero-Holding Chambers (VALVED HOLDING CHAMBER) DEVI, , Disp: , Rfl:    TURMERIC PO, Take 400 mg by mouth daily., Disp: , Rfl:    valsartan   (DIOVAN ) 160 MG tablet, TAKE 1 TABLET(160 MG) BY MOUTH DAILY, Disp: 90 tablet, Rfl: 3   vitamin C (ASCORBIC ACID) 500 MG tablet, Take 500 mg by mouth daily., Disp: , Rfl:    VITAMIN D PO, Take by mouth daily., Disp: , Rfl:   Vitals   Vitals:   08/21/24 1600 08/21/24 1615 08/21/24 1630 08/21/24 1700  BP: (!) 192/60 (!) 192/61 (!) 185/61 (!) 182/61  Pulse: 65 60 61 62  Resp: 18 13 14 16   Temp:      TempSrc:      SpO2: 98% 100% 98% 99%  Weight:      Height:        Body mass index is 47.55 kg/m.   Physical Exam   Constitutional: Appears well-developed and well-nourished.  Neurologic Examination    Neuro: Mental Status: Patient is awake, alert, oriented to person, place, month, year, and situation. Patient is able to give a clear and coherent history. No signs of aphasia or neglect Cranial Nerves: II: Visual Fields are full. Pupils are equal, round, and reactive to light.   III,IV, VI: EOMI without ptosis or diploplia.  V: Facial sensation is symmetric to temperature VII: Facial movement is symmetric.  VIII: hearing is intact to voice X: Uvula elevates symmetrically XII: tongue is midline without atrophy or fasciculations.  Motor: Tone is normal. Bulk is normal. 5/5 strength was present in all four extremities.  Sensory: Sensation is symmetric to light touch and temperature in the arms and legs. Cerebellar: FNF and HKS are intact bilaterally        Labs/Imaging/Neurodiagnostic studies   CBC:  Recent Labs  Lab September 15, 2024 1525  WBC 7.5  NEUTROABS 3.9  HGB 11.4*  HCT 34.2*  MCV 92.7  PLT 225   Basic Metabolic Panel:  Lab Results  Component Value Date   NA 134 (L) 09-15-2024   K 4.6 September 15, 2024   CO2 27 September 15, 2024   GLUCOSE 90 15-Sep-2024   BUN 19 15-Sep-2024   CREATININE 0.74 2024/09/15   CALCIUM  8.5 (L) September 15, 2024   GFRNONAA >60 15-Sep-2024   GFRAA >60 01/15/2019   Lipid Panel:  Lab Results  Component Value Date   LDLCALC 7 05/02/2022   HgbA1c:   Lab Results  Component Value Date   HGBA1C 8.7 (H) 05/02/2022   Alcohol Level     Component Value Date/Time   ETH <15 09-15-24 1525   INR  Lab Results  Component Value Date   INR 1.0 09-15-2024   APTT  Lab Results  Component Value Date   APTT 28 09/15/24     CT Head without contrast(Personally reviewed): Negative  CT angio Head and Neck with contrast(Personally reviewed): No LVO    ASSESSMENT   Lakia P Reise is a 89 y.o. female with paresthesias of the left face in the setting of severe hypertension.  This could be due to stroke, though positive symptoms with stroke are unusual.  I think it could also be secondary to her hypertensive emergency, and I would favor getting an MRI if it is negative then I would favor more aggressive blood pressure control.  RECOMMENDATIONS  MRI brain Stroke workup if positive, if negative would favor treating her hypertension ______________________________________________________________________    Signed, Aisha Seals, MD [facility]

## 2024-08-21 NOTE — ED Triage Notes (Signed)
 PT arrived via EMS today from home due to numbness in her face. Pt sts that it started around 1300 after she woke up from a nap. Pt is A/Ox4, NAD, negative for BEFAST. SABRA

## 2024-08-21 NOTE — Progress Notes (Signed)
   08/21/24 1530  Spiritual Encounters  Type of Visit Initial  Care provided to: Pt and family  Conversation partners present during encounter Nurse  Referral source Code page  Reason for visit Code  OnCall Visit Yes   Chaplain visited patient per a Code Stroke that came in.  When Chaplain fist arrived, no family was present and patient was in CT.  After, waiting, Chaplain found patient and daughter in the room with staff.  Patient was agitated about the constricting of the BP cuff.  Daughter and Nurse calmed the patient and assured her the tightness would pass.  Chaplain offered a compassionate presence and support but patient and daughter said they didn't have any spiritual care needs at this time.   Rev. Rana M. Nicholaus, M.Div. Chaplain Resident [facility]

## 2024-08-21 NOTE — Discharge Instructions (Signed)
 You presented acutely with left sided facial numbness.  Workup was reassuring and our stroke team have cleared you from home.  We suspect your symptoms were secondary to an elevated blood pressure.  Continue your regular scheduled home medications.  Use your hydralazine if your blood pressure does not downtrend after laying flat for 20 minutes.  Please follow-up with your cardiologist on Friday as scheduled and discuss further blood pressure management.  Return with any acutely worsening symptoms or any other emergency.  It was very nice meeting you and I wish you the best of luck. -- RETURN PRECAUTIONS & AFTERCARE: (ENGLISH) RETURN PRECAUTIONS: Return immediately to the emergency department or see/call your doctor if you feel worse, weak or have changes in speech or vision, are short of breath, have fever, vomiting, pain, bleeding or dark stool, trouble urinating or any new issues. Return here or see/call your doctor if not improving as expected for your suspected condition. FOLLOW-UP CARE: Call your doctor and/or any doctors we referred you to for more advice and to make an appointment. Do this today, tomorrow or after the weekend. Some doctors only take PPO insurance so if you have HMO insurance you may want to contact your HMO or your regular doctor for referral to a specialist within your plan. Either way tell the doctor's office that it was a referral from the emergency department so you get the soonest possible appointment.  YOUR TEST RESULTS: Take result reports of any blood or urine tests, imaging tests and EKG's to your doctor and any referral doctor. Have any abnormal tests repeated. Your doctor or a referral doctor can let you know when this should be done. Also make sure your doctor contacts this hospital to get any test results that are not currently available such as cultures or special tests for infection and final imaging reports, which are often not available at the time you leave the ER but  which may list additional important findings that are not documented on the preliminary report. BLOOD PRESSURE: If your blood pressure was greater than 120/80 have your blood pressure rechecked within 1 to 2 weeks. MEDICATION SIDE EFFECTS: Do not drive, walk, bike, take the bus, etc. if you have received or are being prescribed any sedating medications such as those for pain or anxiety or certain antihistamines like Benadryl. If you have been give one of these here get a taxi home or have a friend drive you home. Ask your pharmacist to counsel you on potential side effects of any new medication

## 2024-08-21 NOTE — ED Provider Notes (Signed)
 [facility] Provider Note    Event Date/Time   First MD Initiated Contact with Patient 08/21/24 1517     (approximate)   History   Numbness   HPI  [name] is a 89 y.o. female with a past medical history of pA-fib (not on anticoagulation), type 2 diabetes, hypothyroidism hyperlipidemia, asthma who presents to the emergency department with 2-1/2 hours of left facial numbness.  Patient was in her usual state of health when she woke up from a nap and noticed that her left face was not.  This was at 1300.  She has never had similar symptoms before.  Denies any facial weakness, hearing or vision changes, headache, chest pain, shortness of breath or weakness or sensation changes in any other extremity.  Per her daughter at bedside she is at her baseline mental status.  Glucose en route was greater than 100 by EMS.  Her blood pressure significantly elevated and is never been this elevated before.  She was negative on BeFAST      Physical Exam   Triage Vital Signs: ED Triage Vitals  Encounter Vitals Group     BP 08/21/24 1526 (!) 230/103     Girls Systolic BP Percentile --      Girls Diastolic BP Percentile --      Boys Systolic BP Percentile --      Boys Diastolic BP Percentile --      Pulse Rate 08/21/24 1527 67     Resp 08/21/24 1526 17     Temp 08/21/24 1526 98 F (36.7 C)     Temp Source 08/21/24 1526 Oral     SpO2 08/21/24 1526 96 %     Weight 08/21/24 1521 260 lb (117.9 kg)     Height 08/21/24 1521 5' 2 (1.575 m)     Head Circumference --      Peak Flow --      Pain Score 08/21/24 1521 0     Pain Loc --      Pain Education --      Exclude from Growth Chart --     Most recent vital signs: Vitals:   08/21/24 2000 08/21/24 2125  BP: (!) 188/62 (!) 182/66  Pulse: (!) 57 65  Resp: 19 18  Temp:  98 F (36.7 C)  SpO2: 98% 98%    Nursing Triage Note reviewed. Vital signs reviewed and patients oxygen saturation is normoxic  General:  Patient is well nourished, well developed, awake and alert, resting comfortably in no acute distress Head: Normocephalic and atraumatic Eyes: Normal inspection, extraocular muscles intact, no conjunctival pallor Ear, nose, throat: Normal external exam Neck: Normal range of motion Respiratory: Patient is in no respiratory distress, lungs CTAB Cardiovascular: Patient is not tachycardic, RRR without murmur appreciated GI: Abd SNT with no guarding or rebound  Back: Normal inspection of the back with good strength and range of motion throughout all ext Extremities: pulses intact with good cap refills, 1+ LE pitting edema no calf tenderness Neuro: The patient is alert and oriented to person, place, and time, appropriately conversive, with 5/5 bilat UE/LE strength, no gross motor noted.  Patient has subjective numbness on the left side of the face. Coordination appears to be adequate. Skin: Warm, dry, and intact Psych: normal mood and affect, no SI or HI  ED Results / Procedures / Treatments   Labs (all labs ordered are listed, but only abnormal results are displayed) Labs Reviewed  CBC - Abnormal;  Notable for the following components:      Result Value   RBC 3.69 (*)    Hemoglobin 11.4 (*)    HCT 34.2 (*)    All other components within normal limits  COMPREHENSIVE METABOLIC PANEL WITH GFR - Abnormal; Notable for the following components:   Sodium 134 (*)    Calcium  8.5 (*)    Total Protein 6.4 (*)    Albumin 3.3 (*)    All other components within normal limits  PROTIME-INR  APTT  DIFFERENTIAL  ETHANOL  I-STAT CREATININE, ED  CBG MONITORING, ED     EKG EKG and rhythm strip are interpreted by myself:   EKG: [Normal sinus rhythm] at heart rate of 63, normal QRS duration, QTc 464, nonspecific ST segments and T waves no ectopy EKG not consistent with Acute STEMI Rhythm strip: NSR in lead II   RADIOLOGY CT head without contrast: No acute abnormality on my independent review  interpretation CT angio head and neck: No large vessel occlusion MRI brain: Pending    PROCEDURES:  Critical Care performed: Yes, see critical care procedure note(s)  .Critical Care  Performed by: Nicholaus Rolland BRAVO, MD Authorized by: Nicholaus Rolland BRAVO, MD   Critical care provider statement:    Critical care time (minutes):  32   Critical care was necessary to treat or prevent imminent or life-threatening deterioration of the following conditions:  CNS failure or compromise   Critical care was time spent personally by me on the following activities:  Development of treatment plan with patient or surrogate, discussions with consultants, evaluation of patient's response to treatment, examination of patient, ordering and review of laboratory studies, ordering and review of radiographic studies, ordering and performing treatments and interventions, pulse oximetry, re-evaluation of patient's condition and review of old charts Comments:     Evaluation for acute stroke alert and in discussion with neurology and reassessments and decision on not to give TNK    MEDICATIONS ORDERED IN ED: Medications  sodium chloride flush (NS) 0.9 % injection 3 mL (3 mLs Intravenous Given 08/21/24 1614)  iohexol (OMNIPAQUE) 350 MG/ML injection 75 mL (75 mLs Intravenous Contrast Given 08/21/24 1542)  hydrALAZINE (APRESOLINE) tablet 10 mg (10 mg Oral Given 08/21/24 1938)     IMPRESSION / MDM / ASSESSMENT AND PLAN / ED COURSE                                Differential diagnosis includes, but is not limited to, acute CVA, intracranial hemorrhage, hypertensive urgency, paresthesia   ED course: Patient arrives and given that she has a focal deficit within the window, she was activated as a code stroke.  Neurologist at bedside and given low NIH stroke score and resolving symptoms do not think patient is a candidate for TNK at this time (patient and family agree).  CT head demonstrated no intracranial hemorrhage.   CT angio demonstrated no large vessel occlusion.  MRI brain was unremarkable.  Patient's blood pressure was treated and patient symptoms completely resolved.  On further review of the day, patient did admit to not taking her full dose of valsartan .  Patients family will help ensure that this is administered correctly.  Will give the patient prescription for hydralazine (she is borderline bradycardic at baseline).  She has already has an appointment on Friday for further blood pressure control and workup for bilateral lower extremity pitting edema.  Patient feels improved and back  to baseline and feels comfortable returning home   Clinical Course as of 08/22/24 0025  Wed Aug 21, 2024  1553 Case discussed with neurology attending.  Agree given mild symptoms no TNK.  Recommends lowering blood pressure to less than 220 and reevaluating and obtaining an MRI [HD]  1657 CT HEAD CODE STROKE WO CONTRAST No acute abnormalities on my independent review interpretation [HD]  1658 CT ANGIO HEAD NECK W WO CM (CODE STROKE) No LVO [HD]  1902 Neurology states that they do not see anything on MRI and would be okay to treat as hypertensive urgency [HD]  1906 Family at bedside.  Discussed neurology recommendations.  She has a cardiology appointment already scheduled for Friday.  Will temporize with hydralazine awaiting final read of MRI [HD]  2046 Patient reassessed after MRI returned normal.  Blood pressures downtrended patient reports complete resolve of left tingling.  All feel comfortable with discharge home [HD]    Clinical Course User Index [HD] Nicholaus Rolland BRAVO, MD   At time of discharge there is no evidence of acute life, limb, vision, or fertility threat. Patient has stable vital signs, pain is well controlled, patient is ambulatory and p.o. tolerant.  Discharge instructions were completed using the EPIC system. I would refer you to those at this time. All warnings prescriptions follow-up etc. were discussed  in detail with the patient. Patient indicates understanding and is agreeable with this plan. All questions answered.  Patient is made aware that they may return to the emergency department for any worsening or new condition or for any other emergency.  -- Risk: 5 This patient has a high risk of morbidity due to further diagnostic testing or treatment. Rationale: This patient's evaluation and management involve a high risk of morbidity due to the potential severity of presenting symptoms, need for diagnostic testing, and/or initiation of treatment that may require close monitoring. The differential includes conditions with potential for significant deterioration or requiring escalation of care. Treatment decisions in the ED, including medication administration, procedural interventions, or disposition planning, reflect this level of risk. COPA: 5 The patient has the following acute or chronic illness/injury that poses a possible threat to life or bodily function: [X] : The patient has a potentially serious acute condition or an acute exacerbation of a chronic illness requiring urgent evaluation and management in the Emergency Department. The clinical presentation necessitates immediate consideration of life-threatening or function-threatening diagnoses, even if they are ultimately ruled out.   FINAL CLINICAL IMPRESSION(S) / ED DIAGNOSES   Final diagnoses:  Paresthesia  Secondary hypertension     Rx / DC Orders   ED Discharge Orders          Ordered    hydrALAZINE (APRESOLINE) 10 MG tablet  Every 6 hours PRN        08/21/24 2048             Note:  This document was prepared using Dragon voice recognition software and may include unintentional dictation errors.   Nicholaus Rolland BRAVO, MD 08/22/24 [place]ORIANNE

## 2024-08-21 NOTE — ED Notes (Signed)
 Initiated code stroke to [facility] 1526

## 2024-08-21 NOTE — Progress Notes (Signed)
 CODE STROKE- PHARMACY COMMUNICATION   Time CODE STROKE called/page received:1528  Time response to CODE STROKE was made (in person or via phone): 1530  Time Stroke Kit retrieved from Pyxis (only if needed): TNK not given   Name of Provider/Nurse contacted:Dr. Michaela   Past Medical History:  Diagnosis Date   Allergy    Asthma    Diabetes mellitus    Diastolic dysfunction    a. 06/2019 Echo: EF 60-65%, no rwma, GR1 DD. Mild MR. Nl RV fxn.   Essential hypertension    GERD (gastroesophageal reflux disease)    Hyperlipidemia    Mixed hyperlipidemia    Osteopenia    PAF (paroxysmal atrial fibrillation) (HCC)    a. Dx in late 1990's - no known recurrence.   Sleep disorder    Thyroid  disease    Prior to Admission medications   Medication Sig Start Date End Date Taking? Authorizing Provider  ACCU-CHEK SMARTVIEW test strip  03/18/15   [provider]  acetaminophen (TYLENOL) 500 MG tablet Take 500 mg by mouth every 4 (four) hours as needed.    [provider]  albuterol  (VENTOLIN  HFA) 108 (90 Base) MCG/ACT inhaler Inhale 2 puffs into the lungs every 6 (six) hours as needed. 01/31/23   Jimmy Charlie FERNS, MD  ALOE PO Take 1 capsule by mouth daily.    [provider]  ALPRAZolam  (XANAX ) 0.25 MG tablet TAKE 1 TABLET(0.25 MG) BY MOUTH TWICE DAILY AS NEEDED FOR ANXIETY 01/04/24   Letvak, Richard I, MD  aspirin 81 MG tablet Take 81 mg by mouth daily.    [provider]  atorvastatin  (LIPITOR) 20 MG tablet TAKE 1 TABLET BY MOUTH EVERY DAY 06/07/24   Jimmy Charlie I, MD  B-D ULTRA-FINE 33 LANCETS MISC Use to test blood sugar twice daily dx: E11.9 11/17/14   Jimmy Charlie I, MD  calcitonin, salmon, (MIACALCIN/FORTICAL) 200 UNIT/ACT nasal spray INSTILL 1 SPRAY NASALLY(ALTERNATING NOSTRILS) EVERY DAY 04/10/24   Letvak, Richard I, MD  cetirizine (ZYRTEC) 5 MG tablet Take 10 mg by mouth daily.    [provider]  COLLAGEN PO Take by mouth daily.     [provider]  CORAL CALCIUM  PO Take by mouth daily.    [provider]  CRANBERRY PO Take by mouth daily.    [provider]  CYANOCOBALAMIN  PO Take by mouth daily.    [provider]  Fluticasone  Furoate (ARNUITY ELLIPTA ) 100 MCG/ACT AEPB INHALE 1 PUFF INTO THE LUNGS DAILY 04/03/24   Letvak, Richard I, MD  furosemide  (LASIX ) 20 MG tablet Take 1 tablet (20 mg total) by mouth 3 (three) times a week. Patient taking differently: Take 20 mg by mouth 3 (three) times a week. As needed 08/25/23 02/23/24  Gollan, Timothy J, MD  glucosamine-chondroitin 500-400 MG tablet Take 1 tablet by mouth daily.    [provider]  hydrocortisone  2.5 % cream Apply topically 3 (three) times daily as needed. 09/18/23   Letvak, Richard I, MD  insulin  glargine (LANTUS ) 100 UNIT/ML Solostar Pen Inject 40 Units into the skin at bedtime. 04/30/15   [provider]  Insulin  Pen Needle (B-D UF III MINI PEN NEEDLES) 31G X 5 MM MISC USE 1 TO 2 TIMES DAILY AS DIRECTED 10/20/23   Jimmy Charlie FERNS, MD  KRILL OIL PO Take by mouth.    [provider]  levothyroxine  (SYNTHROID , LEVOTHROID) 100 MCG tablet Take 100 mcg by mouth daily before breakfast.    [provider]  MAGNESIUM  PO Take 1 tablet by mouth in the morning and at bedtime.    [provider]  metoprolol  succinate (TOPROL -XL) 50 MG 24 hr tablet TAKE 1 TABLET(50 MG) BY MOUTH DAILY 04/01/24   Gollan, Timothy J, MD  montelukast  (SINGULAIR ) 10 MG tablet TAKE 1 TABLET(10 MG) BY MOUTH AT BEDTIME 02/13/24   Jimmy Charlie FERNS, MD  Multiple Vitamin (MULTIVITAMIN) tablet Take 1 tablet by mouth daily. Centrum Silver    [provider]  Multiple Vitamins-Minerals (OCUVITE PO) Take 1 tablet by mouth daily.    [provider]  NOVOLOG FLEXPEN 100 UNIT/ML FlexPen 16 units 1st meal 22 units 2nd meal 05/16/22   [provider]  omeprazole  (PRILOSEC) 20 MG capsule Take 1 capsule (20 mg total)  by mouth 2 (two) times daily before a meal. 09/18/23   Jimmy Charlie FERNS, MD  Spacer/Aero-Holding Chambers (VALVED HOLDING CHAMBER) DEVI  10/04/21   [provider]  TURMERIC PO Take 400 mg by mouth daily.    [provider]  valsartan  (DIOVAN ) 160 MG tablet TAKE 1 TABLET(160 MG) BY MOUTH DAILY 08/05/24   Gollan, Timothy J, MD  vitamin C (ASCORBIC ACID) 500 MG tablet Take 500 mg by mouth daily.    [provider]  VITAMIN D PO Take by mouth daily.    [provider]    [name], PharmD, BCPS Clinical Pharmacist 08/21/2024 3:48 PM

## 2024-08-23 ENCOUNTER — Encounter: Payer: Self-pay | Admitting: Nurse Practitioner

## 2024-08-23 ENCOUNTER — Ambulatory Visit: Attending: Nurse Practitioner | Admitting: Physician Assistant

## 2024-08-23 VITALS — BP 122/60 | HR 62 | Wt 223.0 lb

## 2024-08-23 DIAGNOSIS — I5189 Other ill-defined heart diseases: Secondary | ICD-10-CM | POA: Diagnosis not present

## 2024-08-23 DIAGNOSIS — I10 Essential (primary) hypertension: Secondary | ICD-10-CM

## 2024-08-23 DIAGNOSIS — I480 Paroxysmal atrial fibrillation: Secondary | ICD-10-CM | POA: Diagnosis not present

## 2024-08-23 DIAGNOSIS — I700 Atherosclerosis of aorta: Secondary | ICD-10-CM

## 2024-08-23 DIAGNOSIS — Z79899 Other long term (current) drug therapy: Secondary | ICD-10-CM

## 2024-08-23 DIAGNOSIS — R600 Localized edema: Secondary | ICD-10-CM | POA: Diagnosis not present

## 2024-08-23 DIAGNOSIS — E782 Mixed hyperlipidemia: Secondary | ICD-10-CM

## 2024-08-23 MED ORDER — FUROSEMIDE 20 MG PO TABS: 20.0000 mg | ORAL_TABLET | ORAL | 2 refills | Status: DC

## 2024-08-23 NOTE — Progress Notes (Signed)
 Cardiology Office Note    Date:  08/23/2024   ID:  Rockie, Vawter 1931-05-01, MRN [id number]  PCP:  Jimmy Charlie FERNS, MD  Cardiologist:  Evalene Lunger, MD  Electrophysiologist:  None   Chief Complaint: Follow-up  History of Present Illness:   [name] is a 89 y.o. female with history of paroxysmal atrial fibrillation, diastolic dysfunction, type 2 diabetes, hypertension, hyperlipidemia, hypothyroidism, and GERD who presents for follow-up on diastolic dysfunction.    Patient was originally diagnosed with atrial fibrillation in the 1990s.  She wore a heart monitor in 06/2016 which showed no evidence of atrial fibrillation.  Echo 06/2016 demonstrated EF 60 to 65%, no RWMA, G1 DD, mild MR.  She was not maintained on anticoagulation Presumed isolated episode in the late 1990s.  She was admitted to the hospital 03/2022 with hyponatremia.  This was treated with DD AVP and D5 bolus.  Seen for hospital follow-up 04/2022 overall doing well although she had ongoing diarrhea for the past 6 months.  Her weight was down 11 pounds.  Losartan  was transition to amlodipine  given high normal potassium. Patient was seen in follow up 09/2022 after recently losing her husband. She reported mild shortness of breath. Her daughter was worried about possible amyloid disease given bilateral carpal tunnel. Echo showed EF 60 to 65% with mild LVH, G1 DD, and no significant valvular abnormalities.  Patient was seen 08/2023 for worsening lower extremity edema and abdominal swelling.  Weight was noted to be up about 14 pounds since prior visit.  Blood pressure was also noted to be elevated.  Amlodipine  was discontinued and she was started on valsartan  in addition to Lasix  20 mg 3 times per week with an additional dose as needed for weight gain.   Patient was most recently seen by Dr. Gollan 02/2024 with weight continuing to trend up.  Felt to be secondary to excess calorie intake.  She was meeting with a  nutritionist.  She reported shortness of breath which was felt to be secondary to deconditioning.  Lower extremity swelling was well-controlled on Lasix .  Patient was recently seen in the emergency room 08/21/2024 with left facial numbness.  Blood pressure was 230/103 on presentation.  EKG without acute ischemic changes.  Labs overall reassuring.  CT head code stroke without contrast was negative for acute abnormalities.  CT angio head code stroke was without LVO.  Neurology was consulted and ordered MRI which was negative.  They recommended treating for hypertensive urgency.  She was given hydralazine with downtrending blood pressures.  Left facial tingling resolved and she was discharged with instruction to follow-up with cardiology as scheduled.  Patient presents today overall doing well from a cardiac perspective.  She has been doing well since discharge from the emergency department and blood pressure has been well-controlled.   She has not needed to take any of the hydralazine that she was prescribed in the ED. Denies recurrence of facial numbness.  She does endorse 1 to 2 weeks of worsening lower extremity swelling.  She had stopped taking Lasix  on a regular basis several months ago because her lower extremity swelling had improved.  She had resumed taking Lasix  20 mg daily for about 4 to 5 days with a 3 to 4 pound weight loss but then stopped taking it as she was concerned about doing this without consulting a provider.  Her weight is up about 4 pounds since her last visit with us  in April. She reports a steady weight  gain since losing her husband 2 years ago.   She has been trying to lose weight by eating healthier and getting more physical activity.  She denies exertional dyspnea, orthopnea, and PND.  No chest pain or palpitations.  Labs independently reviewed: 08/2024-Hgb 11.4, HCT 34.2, platelets 225, sodium 134, potassium 4.6, BUN 19, creatinine 0.74, normal LFTs 04/2024-Hgb 7.2 05/2023-TC 133, TG  88, HDL 64, LDL 51  Objective   Past Medical History:  Diagnosis Date   Allergy    Asthma    Diabetes mellitus    Diastolic dysfunction    a. 06/2019 Echo: EF 60-65%, no rwma, GR1 DD. Mild MR. Nl RV fxn.   Essential hypertension    GERD (gastroesophageal reflux disease)    Hyperlipidemia    Mixed hyperlipidemia    Osteopenia    PAF (paroxysmal atrial fibrillation) (HCC)    a. Dx in late 1990's - no known recurrence.   Sleep disorder    Thyroid  disease     Current Medications: Current Meds  Medication Sig   ACCU-CHEK SMARTVIEW test strip    acetaminophen (TYLENOL) 500 MG tablet Take 500 mg by mouth every 4 (four) hours as needed.   albuterol  (VENTOLIN  HFA) 108 (90 Base) MCG/ACT inhaler Inhale 2 puffs into the lungs every 6 (six) hours as needed.   ALOE PO Take 1 capsule by mouth daily.   ALPRAZolam  (XANAX ) 0.25 MG tablet TAKE 1 TABLET(0.25 MG) BY MOUTH TWICE DAILY AS NEEDED FOR ANXIETY (Patient taking differently: as needed. TAKE 1 TABLET(0.25 MG) BY MOUTH TWICE DAILY AS NEEDED FOR ANXIETY)   aspirin 81 MG tablet Take 81 mg by mouth daily.   atorvastatin  (LIPITOR) 20 MG tablet TAKE 1 TABLET BY MOUTH EVERY DAY   B-D ULTRA-FINE 33 LANCETS MISC Use to test blood sugar twice daily dx: E11.9   calcitonin, salmon, (MIACALCIN/FORTICAL) 200 UNIT/ACT nasal spray INSTILL 1 SPRAY NASALLY(ALTERNATING NOSTRILS) EVERY DAY   cetirizine (ZYRTEC) 5 MG tablet Take 10 mg by mouth daily.   COLLAGEN PO Take by mouth daily.   CORAL CALCIUM  PO Take by mouth daily.   CRANBERRY PO Take by mouth daily.   CYANOCOBALAMIN  PO Take by mouth daily.   Fluticasone  Furoate (ARNUITY ELLIPTA ) 100 MCG/ACT AEPB INHALE 1 PUFF INTO THE LUNGS DAILY   glucosamine-chondroitin 500-400 MG tablet Take 1 tablet by mouth daily.   hydrALAZINE (APRESOLINE) 10 MG tablet Take 1 tablet (10 mg total) by mouth every 6 (six) hours as needed for up to 14 days (For systolic blood pressure greater than 180 and diastolic blood pressure  greater than 105).   hydrocortisone  2.5 % cream Apply topically 3 (three) times daily as needed.   insulin  glargine (LANTUS ) 100 UNIT/ML Solostar Pen Inject 40 Units into the skin at bedtime.   Insulin  Pen Needle (B-D UF III MINI PEN NEEDLES) 31G X 5 MM MISC USE 1 TO 2 TIMES DAILY AS DIRECTED   KRILL OIL PO Take by mouth.   levothyroxine  (SYNTHROID , LEVOTHROID) 100 MCG tablet Take 100 mcg by mouth daily before breakfast.   MAGNESIUM  PO Take 1 tablet by mouth in the morning and at bedtime.   metoprolol  succinate (TOPROL -XL) 50 MG 24 hr tablet TAKE 1 TABLET(50 MG) BY MOUTH DAILY   montelukast  (SINGULAIR ) 10 MG tablet TAKE 1 TABLET(10 MG) BY MOUTH AT BEDTIME   Multiple Vitamin (MULTIVITAMIN) tablet Take 1 tablet by mouth daily. Centrum Silver   Multiple Vitamins-Minerals (OCUVITE PO) Take 1 tablet by mouth daily.   NOVOLOG FLEXPEN  100 UNIT/ML FlexPen 16 units 1st meal 22 units 2nd meal   omeprazole  (PRILOSEC) 20 MG capsule Take 1 capsule (20 mg total) by mouth 2 (two) times daily before a meal.   Spacer/Aero-Holding Chambers (VALVED HOLDING CHAMBER) DEVI    TURMERIC PO Take 400 mg by mouth daily.   valsartan  (DIOVAN ) 160 MG tablet TAKE 1 TABLET(160 MG) BY MOUTH DAILY   vitamin C (ASCORBIC ACID) 500 MG tablet Take 500 mg by mouth daily.   VITAMIN D PO Take by mouth daily.   [DISCONTINUED] furosemide  (LASIX ) 20 MG tablet Take 1 tablet (20 mg total) by mouth 3 (three) times a week. (Patient taking differently: Take 20 mg by mouth 3 (three) times a week. As needed)    Allergies:   Gabapentin , Cefdinir, Ciprofloxacin, Clarithromycin, Diclofenac, Miglitol, Niacin, Qvar  [beclomethasone], Jardiance [empagliflozin], and Metformin and related   Social History   Socioeconomic History   Marital status: Widowed    Spouse name: Not on file   Number of children: 2   Years of education: Not on file   Highest education level: Not on file  Occupational History    Employer: RETIRED  Tobacco Use   Smoking  status: Never    Passive exposure: Never   Smokeless tobacco: Never  Vaping Use   Vaping status: Never Used  Substance and Sexual Activity   Alcohol use: Not Currently    Alcohol/week: 1.0 standard drink of alcohol    Types: 1 Glasses of wine per week    Comment: 1 glass of wine-rare   Drug use: No   Sexual activity: Not Currently  Other Topics Concern   Not on file  Social History Narrative   Widowed 3/23      Has living will   Not sure about formal POA --but requests daughter [name] (and son Marty) to be health care POAs   Would accept resuscitation attempts   no feeding tubes if cognitively unaware   Social Drivers of Health   Financial Resource Strain: Low Risk  (04/09/2022)   Received from [facility]   Overall Financial Resource Strain (CARDIA)    Difficulty of Paying Living Expenses: Not hard at all  Food Insecurity: No Food Insecurity (03/25/2024)   Hunger Vital Sign    Worried About Running Out of Food in the Last Year: Never true    Ran Out of Food in the Last Year: Never true  Transportation Needs: No Transportation Needs (04/09/2022)   Received from [facility]   PRAPARE - Transportation    Lack of Transportation (Medical): No    Lack of Transportation (Non-Medical): No  Physical Activity: Not on file  Stress: Not on file  Social Connections: Not on file     Family History:  The patient's family history includes Asthma in her mother; Coronary artery disease in her father.  ROS:   12-point review of systems is negative unless otherwise noted in the HPI.  EKGs/Other Studies Reviewed:    Studies reviewed were summarized above. The additional studies were reviewed today:  11/2022 Echo complete 1. Left ventricular ejection fraction, by estimation, is 60 to 65%. The left ventricle has normal function. The left ventricle has no regional wall motion abnormalities. There is mild left ventricular hypertrophy. Left ventricular diastolic parameters are  consistent with Grade I diastolic dysfunction (impaired relaxation).   2. Right ventricular systolic function is normal. The right ventricular size is normal. Tricuspid regurgitation signal is inadequate for assessing  PA pressure.  3. The mitral valve is normal in structure. No evidence of mitral valve regurgitation. No evidence of mitral stenosis.   4. The aortic valve is normal in structure. Aortic valve regurgitation is not visualized. No aortic stenosis is present.   5. The inferior vena cava is normal in size with greater than 50% respiratory variability, suggesting right atrial pressure of 3 mmHg.   EKG:  EKG personally reviewed by me today EKG Interpretation Date/Time:  Friday August 23 2024 14:06:30 EDT Ventricular Rate:  62 PR Interval:  214 QRS Duration:  96 QT Interval:  444 QTC Calculation: 450 R Axis:   7  Text Interpretation: Sinus rhythm with 1st degree A-V block Low voltage QRS Incomplete right bundle branch block Confirmed by Lorene Sinclair ([id number]) on 08/23/2024 2:50:14 PM  PHYSICAL EXAM:    VS:  BP 122/60 (BP Location: Left Arm, Patient Position: Sitting, Cuff Size: Normal)   Pulse 62   Wt 223 lb (101.2 kg)   SpO2 96%   BMI 40.79 kg/m   BMI: Body mass index is 40.79 kg/m.  GEN: Well nourished, well developed in no acute distress NECK: No JVD; No carotid bruits CARDIAC: RRR, I/VI systolic murmurs; no rubs or gallops RESPIRATORY:  Clear to auscultation without rales, wheezing or rhonchi  ABDOMEN: Soft, non-tender, non-distended EXTREMITIES: Trace LE edema; No deformity  Wt Readings from Last 3 Encounters:  08/23/24 223 lb (101.2 kg)  08/21/24 260 lb (117.9 kg)  02/23/24 219 lb (99.3 kg)                  ASSESSMENT & PLAN:   Diastolic dysfunction Lower extremity swelling - Most recent echo 11/2022 showed EF 60 to 65% with G1 DD.  She has trace lower extremity swelling.  Weight is up 4 pounds since last visit in April.  No exertional dyspnea.   Recommend Lasix  20 mg once daily on Mondays, Wednesdays, and Fridays.  Renal function stable on recent labs in the ED 10/8.  Recommend repeat BMP in 2 weeks.  If symptoms do not improve with Lasix , could consider repeat echo at follow-up.  Paroxysmal atrial fibrillation - Appears to be an isolated episode in the 1990s.  No symptoms concerning for recurrence.  Maintaining sinus rhythm today.  Has not been maintained on anticoagulation given presumed isolated episode.  She is continued on metoprolol  succinate 50 mg daily.  Hypertension - Blood pressure well-controlled today on current doses of metoprolol  and valsartan .  Hyperlipidemia Aortic atherosclerosis - Most recent lipid panel 05/2023 with LDL 51, at goal.  She is continued on atorvastatin  20 mg daily.     Disposition: Start Lasix  20 mg once daily on Mondays, Wednesdays, and Fridays. BMP in 2 weeks. F/u with me in 1-2 months.   Medication Adjustments/Labs and Tests Ordered: Current medicines are reviewed at length with the patient today.  Concerns regarding medicines are outlined above. Medication changes, Labs and Tests ordered today are summarized above and listed in the Patient Instructions accessible in Encounters.   Signed, Sinclair Lorene, PA-C 08/23/2024 3:27 PM     [facility] [place] [address] [place], [place] [address] [phone]

## 2024-08-23 NOTE — Patient Instructions (Signed)
 Medication Instructions:   Your physician recommends the following medication changes.  START TAKING: Furosemide  (Lasix ) 20 mg one tablet 3 times per week on Mondays, Wednesdays, and Fridays  *If you need a refill on your cardiac medications before your next appointment, please call your pharmacy*  Lab Work:  Your provider would like for you to return in 2 Weeks to have the following labs drawn: BMet.   Please go to [facility] [address] ([facility] [address], [place] [address] You do not need an appointment.  They are open from 8 am- 4:30 pm.  Lunch from 1:00 pm- 2:00 pm You DO NOT need to be fasting.   You may also go to one of the following [facility]:  [address] [place], [place] [address] Phone: [phone] Lab hours: Mon-Fri 8 am- 5 pm    Lunch 12 pm- 1 pm  [address] [place],  [place]  [address]  [place]  Phone: [phone] Lab hours: 7 am- 4 pm Lunch 12 pm-1 pm   [address] [place],  [place]  [address]  [place]  Phone: [phone] Lab hours: Mon-Fri 8 am- 5 pm    Lunch 12 pm- 1 pm   If you have labs (blood work) drawn today and your tests are completely normal, you will receive your results only by:  MyChart Message (if you have MyChart) OR  A paper copy in the mail If you have any lab test that is abnormal or we need to change your treatment, we will call you to review the results.  Testing/Procedures:  None ordered at this time   Referrals:  None ordered at this time   Follow-Up:  At [facility], you and your health needs are our priority.  As part of our continuing mission to provide you with exceptional heart care, our providers are all part of one team.  This team includes your primary Cardiologist (physician) and Advanced Practice Providers or APPs (Physician Assistants and Nurse Practitioners) who all work together to provide you with the care you need, when you need it.  Your next appointment:   1 - 2  month(s)  Provider:    Lesley Maffucci, PA-C    We recommend signing up for the patient portal called MyChart.  Sign up information is provided on this After Visit Summary.  MyChart is used to connect with patients for Virtual Visits (Telemedicine).  Patients are able to view lab/test results, encounter notes, upcoming appointments, etc.  Non-urgent messages can be sent to your provider as well.   To learn more about what you can do with MyChart, go to [email or web address].

## 2024-09-13 ENCOUNTER — Other Ambulatory Visit: Payer: Self-pay

## 2024-09-13 MED ORDER — OMEPRAZOLE 20 MG PO CPDR: 20.0000 mg | DELAYED_RELEASE_CAPSULE | Freq: Two times a day (BID) | ORAL | 0 refills | Status: AC

## 2024-09-19 ENCOUNTER — Ambulatory Visit

## 2024-09-19 VITALS — BP 130/80 | Ht 62.0 in | Wt 226.0 lb

## 2024-09-19 DIAGNOSIS — E559 Vitamin D deficiency, unspecified: Secondary | ICD-10-CM

## 2024-09-19 DIAGNOSIS — L989 Disorder of the skin and subcutaneous tissue, unspecified: Secondary | ICD-10-CM

## 2024-09-19 DIAGNOSIS — I509 Heart failure, unspecified: Secondary | ICD-10-CM | POA: Diagnosis not present

## 2024-09-19 DIAGNOSIS — Z79899 Other long term (current) drug therapy: Secondary | ICD-10-CM

## 2024-09-19 DIAGNOSIS — Z23 Encounter for immunization: Secondary | ICD-10-CM

## 2024-09-19 DIAGNOSIS — Z136 Encounter for screening for cardiovascular disorders: Secondary | ICD-10-CM | POA: Diagnosis not present

## 2024-09-19 NOTE — Patient Instructions (Addendum)
 Thank you for visiting [facility] today! Here's what we talked about: - Take levothyroxine  1 hour before all other meds and food - STOP Alprazolam  - I will get back to you about Aspirin

## 2024-09-19 NOTE — Progress Notes (Signed)
 Subjective:   This visit was conducted in person. The patient gave informed consent to the use of Abridge AI technology to record the contents of the encounter as documented below.   Patient ID: [name], female    DOB: 08-12-1931, 89 y.o.   MRN: [id number]   Discussed the use of AI scribe software for clinical note transcription with the patient, who gave verbal consent to proceed.  History of Present Illness [name] is a 89 year old female with atrial fibrillation and diabetes who presents for medication review and management of skin lesions. She is accompanied by her daughter, who is her primary caregiver.  She has a history of atrial fibrillation and regularly follows up with cardiology. She recently had a cardiology appointment on the tenth of the month. She is not currently taking Lasix  and is awaiting kidney function tests.  Her diabetes was previously mismanaged due to a medication error when her endocrinologist was changed, leading to a period of uncontrolled diabetes and hospitalization. Her diabetes is now managed by the [facility]. She takes her thyroid  medication in the morning, an hour before eating, but with other medications.  She experienced significant stress and depression following the sudden death of her husband three years ago, which coincided with the mismanagement of her diabetes. During this period, she developed carpal tunnel syndrome, which severely impacted her ability to perform daily activities. She underwent physical therapy and has since regained most functions, although she still requires assistance with some tasks and uses a walker for mobility.  She has skin lesions on her neck and behind her ears, which she is concerned about. She has an upcoming appointment with a dermatologist for a routine skin check. The lesions are not growing or changing significantly. No recent changes in the size or appearance of her skin lesions.  She has a  history of taking alprazolam  for sleep, which she has discontinued for the past six months. She also takes aspirin, although there is no strong medical indication for its use as she has not had a stroke or heart attack.  She experiences occasional forgetfulness, which she attributes to stress and aging, rather than a cognitive disorder. She has a history of a hospitalization for palpitations ten days before her [age] birthday, but no history of stroke.      Review of Systems  All other systems reviewed and are negative.       Allergies  Allergen Reactions   Gabapentin  Swelling   Cefdinir     REACTION: unspecified   Ciprofloxacin     rash   Clarithromycin     REACTION: weird dreams (on prednisone  at the same time but has tolerated this in the past)   Diclofenac Dermatitis    Topical only  Causes burning to site   Miglitol     REACTION: unspecified   Niacin     REACTION: unspecified   Qvar  [Beclomethasone] Other (See Comments)    Feels funny Feels funny   Jardiance [Empagliflozin] Other (See Comments)    Yeast infection    Metformin And Related Diarrhea    Current Outpatient Medications on File Prior to Visit  Medication Sig Dispense Refill   ACCU-CHEK SMARTVIEW test strip   3   acetaminophen (TYLENOL) 500 MG tablet Take 500 mg by mouth every 4 (four) hours as needed. (Patient taking differently: Take 500 mg by mouth 2 (two) times daily.)     albuterol  (VENTOLIN  HFA) 108 (90 Base) MCG/ACT  inhaler Inhale 2 puffs into the lungs every 6 (six) hours as needed. 18 g 1   ALOE PO Take 1 capsule by mouth daily.     aspirin 81 MG tablet Take 81 mg by mouth daily.     atorvastatin  (LIPITOR) 20 MG tablet TAKE 1 TABLET BY MOUTH EVERY DAY 90 tablet 3   B-D ULTRA-FINE 33 LANCETS MISC Use to test blood sugar twice daily dx: E11.9 200 each 3   calcitonin, salmon, (MIACALCIN/FORTICAL) 200 UNIT/ACT nasal spray INSTILL 1 SPRAY NASALLY(ALTERNATING NOSTRILS) EVERY DAY 3.7 mL 11    cetirizine (ZYRTEC) 5 MG tablet Take 10 mg by mouth daily.     COLLAGEN PO Take by mouth daily.     CORAL CALCIUM  PO Take by mouth daily.     CRANBERRY PO Take by mouth daily.     CYANOCOBALAMIN  PO Take by mouth daily.     Fluticasone  Furoate (ARNUITY ELLIPTA ) 100 MCG/ACT AEPB INHALE 1 PUFF INTO THE LUNGS DAILY 30 each 11   furosemide  (LASIX ) 20 MG tablet Take 1 tablet (20 mg total) by mouth 3 (three) times a week. Take one tablet on Mondays, Wednesdays, and Fridays 45 tablet 2   glucosamine-chondroitin 500-400 MG tablet Take 1 tablet by mouth daily.     hydrocortisone  2.5 % cream Apply topically 3 (three) times daily as needed. 28 g 3   insulin  glargine (LANTUS ) 100 UNIT/ML Solostar Pen Inject 40 Units into the skin at bedtime.     Insulin  Pen Needle (B-D UF III MINI PEN NEEDLES) 31G X 5 MM MISC USE 1 TO 2 TIMES DAILY AS DIRECTED 200 each 4   KRILL OIL PO Take by mouth.     levothyroxine  (SYNTHROID , LEVOTHROID) 100 MCG tablet Take 100 mcg by mouth daily before breakfast.     MAGNESIUM  PO Take 1 tablet by mouth in the morning and at bedtime.     metoprolol  succinate (TOPROL -XL) 50 MG 24 hr tablet TAKE 1 TABLET(50 MG) BY MOUTH DAILY 90 tablet 3   montelukast  (SINGULAIR ) 10 MG tablet TAKE 1 TABLET(10 MG) BY MOUTH AT BEDTIME 90 tablet 3   Multiple Vitamin (MULTIVITAMIN) tablet Take 1 tablet by mouth daily. Centrum Silver     Multiple Vitamins-Minerals (OCUVITE PO) Take 1 tablet by mouth daily.     NOVOLOG FLEXPEN 100 UNIT/ML FlexPen 16 units 1st meal 22 units 2nd meal     omeprazole  (PRILOSEC) 20 MG capsule Take 1 capsule (20 mg total) by mouth 2 (two) times daily before a meal. 180 capsule 0   Spacer/Aero-Holding Chambers (VALVED HOLDING CHAMBER) DEVI      TURMERIC PO Take 400 mg by mouth daily.     valsartan  (DIOVAN ) 160 MG tablet TAKE 1 TABLET(160 MG) BY MOUTH DAILY 90 tablet 3   vitamin C (ASCORBIC ACID) 500 MG tablet Take 500 mg by mouth daily.     VITAMIN D PO Take by mouth daily.      hydrALAZINE (APRESOLINE) 10 MG tablet Take 1 tablet (10 mg total) by mouth every 6 (six) hours as needed for up to 14 days (For systolic blood pressure greater than 180 and diastolic blood pressure greater than 105). (Patient not taking: Reported on 09/19/2024) 30 tablet 0   No current facility-administered medications on file prior to visit.    BP 130/80 (BP Location: Left Arm, Patient Position: Sitting, Cuff Size: Large)   Ht 5' 2 (1.575 m)   Wt 226 lb (102.5 kg)   BMI 41.34 kg/m  Objective:      Physical Exam  GENERAL: Alert, cooperative, well developed, no acute distress. Obese HEAD: Normocephalic atraumatic. EYES: Extraocular movements intact bilaterally, pupils round, equal and reactive to light bilaterally, conjunctivae normal bilaterally. EXTREMITIES: No cyanosis or edema. NEUROLOGICAL: Oriented to person, place and time, no gait abnormalities, moves all extremities without gross motor or sensory deficit. SKIN: Nevus on neck with some scale, symmetrical, smooth borders, uniform color, likely seborrheic keratosis.         Assessment & Plan:   Assessment & Plan Polypharmacy Patient is on numerous medications, given age, discussed risks of benzodiazepine, agreeable to completely discontinue Xanax  at this time.   CHF No clinical signs of acute exacerbation today.  Was recently seen by cardiology on 10/10, where she was put on Lasix  20 mg 3 times weekly for period of 2 weeks, then this was discontinued.  Will order repeat BMP today. - Repeat BMP ordered   Need for flu vaccine - Administered flu shot. - Scheduled follow-up visit in three weeks for physical or Medicare wellness exam. - Scheduled fasting blood work one week prior to follow-up visit. - Coordinated with health coach for phone visit to assess falls, hearing, and balance.   Skin lesion Seborrheic keratosis on neck, as well as what appears to be a benign nevus posterior to the left ear with no concerning  features for malignancy.  Counseled patient about alarming features including change in size, color, easy bleeding without healing, patient verbalized understanding.  - Took a photograph of the lesion for future comparison. - Monitor for changes in size, asymmetry, border irregularity, color changes, or bleeding.    Return in about 3 weeks (around 10/10/2024) for CPE and medicare wellness, fasting lab visit 1 wk prior to CPE.   Tegh Franek K Ethelwyn Gilbertson, MD  09/19/24     Contains text generated by Abridge.

## 2024-09-20 ENCOUNTER — Telehealth: Payer: Self-pay

## 2024-09-20 LAB — BASIC METABOLIC PANEL WITH GFR
BUN: 25 mg/dL — ABNORMAL HIGH (ref 6–23)
CO2: 28 meq/L (ref 19–32)
Calcium: 8.7 mg/dL (ref 8.4–10.5)
Chloride: 99 meq/L (ref 96–112)
Creatinine, Ser: 0.88 mg/dL (ref 0.40–1.20)
GFR: 57.36 mL/min — ABNORMAL LOW (ref >60.00)
Glucose, Bld: 155 mg/dL — ABNORMAL HIGH (ref 70–99)
Potassium: 5.4 meq/L — ABNORMAL HIGH (ref 3.5–5.1)
Sodium: 135 meq/L (ref 135–145)

## 2024-09-20 NOTE — Telephone Encounter (Signed)
-----   Message from Reuben MARLA Burkes sent at 09/19/2024  7:26 PM EST ----- Pls call pt and counsel her that she should continue her daily aspirin

## 2024-09-20 NOTE — Telephone Encounter (Signed)
Spoke to pt's daughter per DPR. 

## 2024-09-21 ENCOUNTER — Ambulatory Visit: Payer: Self-pay

## 2024-09-21 DIAGNOSIS — E875 Hyperkalemia: Secondary | ICD-10-CM

## 2024-09-24 ENCOUNTER — Ambulatory Visit (INDEPENDENT_AMBULATORY_CARE_PROVIDER_SITE_OTHER)

## 2024-09-24 VITALS — Ht 62.0 in | Wt 226.0 lb

## 2024-09-24 DIAGNOSIS — Z0000 Encounter for general adult medical examination without abnormal findings: Secondary | ICD-10-CM

## 2024-09-24 NOTE — Progress Notes (Signed)
 Chief Complaint  Patient presents with   Medicare Wellness     Subjective:   [name] is a 89 y.o. female who presents for a Medicare Annual Wellness Visit.  Allergies (verified) Gabapentin , Cefdinir, Ciprofloxacin, Clarithromycin, Diclofenac, Miglitol, Niacin, Qvar   [beclomethasone], Jardiance [empagliflozin], and Metformin and related   History: Past Medical History:  Diagnosis Date   Allergy    Asthma    Diabetes mellitus    Diastolic dysfunction    a. 06/2019 Echo: EF 60-65%, no rwma, GR1 DD. Mild MR. Nl RV fxn.   Essential hypertension    GERD (gastroesophageal reflux disease)    Hyperlipidemia    Mixed hyperlipidemia    Osteopenia    PAF (paroxysmal atrial fibrillation) (HCC)    a. Dx in late 1990's - no known recurrence.   Sleep disorder    Thyroid  disease    Past Surgical History:  Procedure Laterality Date   CARPAL TUNNEL RELEASE Right    CERVICAL CONE BIOPSY  07/16/1959   CHOLECYSTECTOMY     NASAL POLYP SURGERY  11/15/1999   STRABISMUS SURGERY  1946 / 1967   VAGINAL DELIVERY     x2   Family History  Problem Relation Age of Onset   Asthma Mother    Coronary artery disease Father    Social History   Occupational History    Employer: RETIRED  Tobacco Use   Smoking status: Never    Passive exposure: Never   Smokeless tobacco: Never  Vaping Use   Vaping status: Never Used  Substance and Sexual Activity   Alcohol use: Not Currently    Alcohol/week: 1.0 standard drink of alcohol    Types: 1 Glasses of wine per week    Comment: 1 glass of wine-rare   Drug use: No   Sexual activity: Not Currently   Tobacco Counseling Counseling given: Not Answered  SDOH Screenings   Food Insecurity: No Food Insecurity (09/24/2024)  Housing: Unknown (09/24/2024)  Transportation Needs: No Transportation Needs (09/24/2024)  Utilities: Not At Risk (09/24/2024)  Alcohol Screen: Low Risk  (09/24/2024)  Depression (PHQ2-9): Low Risk  (09/24/2024)  Financial Resource Strain: Low Risk  (09/24/2024)  Physical Activity: Inactive (09/24/2024)  Social Connections: Moderately Isolated (09/24/2024)  Stress: No Stress Concern Present (09/24/2024)  Tobacco Use: Low Risk  (09/24/2024)  Health Literacy: Adequate Health Literacy (09/24/2024)    Depression Screen    09/24/2024    3:07 PM 09/19/2024    3:23 PM 03/25/2024    2:04 PM 09/18/2023    4:22 PM 09/18/2023    4:03 PM 09/18/2023    4:02 PM 05/29/2023    3:45 PM  PHQ 2/9 Scores  PHQ - 2 Score 1 1 0 1 1 0 4  PHQ- 9 Score 3 3   3   7       Data saved with a previous flowsheet row definition     Goals Addressed             This Visit's Progress    pt wants to lose some weight         Visit info / Clinical Intake: Medicare Wellness Visit Type:: Subsequent Annual Wellness Visit Persons participating in visit:: patient & caregiver Information given by:: patient; caregiver Interpreter Needed?: No Pre-visit prep was completed: yes AWV questionnaire completed by patient prior to visit?: no Living arrangements:: with family/others Patient's Overall Health Status Rating: good Typical amount of pain: none Does pain affect daily life?: (!) yes Are you currently  prescribed opioids?: no  Dietary Habits and Nutritional Risks How many meals a day?: 2 Eats fruit and vegetables daily?: yes Most meals are obtained by: preparing own meals Diabetic:: (!) yes Any non-healing wounds?: no How often do you check your BS?: 2 Would you like to be referred to a Nutritionist or for Diabetic Management? : no  Functional Status Activities of Daily Living (to include ambulation/medication): (!) Needs Assist (daughter/caretaker helps her with all these) Feeding: Independent Dressing/Grooming: Needs assistance Bathing: Needs assistance Toileting: Independent Transfer: Independent with device- listed below Ambulation: Independent with device- listed below Home Assistive Devices/Equipment: Walker (specify Type) Medication Administration: Needs assistance (comment) Is this a change from baseline?: Pre-admission baseline Home Management: Needs assistance (comment) Manage your own finances?: (!) no Primary transportation is: family/friends Concerns about vision?: no *vision  screening is required for WTM* Concerns about hearing?: (!) yes Uses hearing aids?: (!) yes Hear whispered voice?: (!) no *in-person visit only*  Fall Screening Falls in the past year?: 1 Number of falls in past year: 0 (tripped over walker) Was there an injury with Fall?: 0 Fall Risk Category Calculator: 1 Patient Fall Risk Level: Low Fall Risk  Fall Risk Patient at Risk for Falls Due to: Impaired balance/gait Fall risk Follow up: Education provided; Falls prevention discussed  Home and Transportation Safety: All rugs have non-skid backing?: yes All stairs or steps have railings?: N/A, no stairs Grab bars in the bathtub or shower?: (!) no Have non-skid surface in bathtub or shower?: (!) no Good home lighting?: yes Regular seat belt use?: yes Hospital stays in the last year:: no  Cognitive Assessment Difficulty concentrating, remembering, or making decisions? : no Will 6CIT or Mini Cog be Completed: yes What year is it?: 0 points What month is it?: 0 points Give patient an address phrase to remember (5 components): [address] [place]  About what time is it?: 0 points Count backwards from 20 to 1: 0 points Say the months of the year in reverse: 0 points Repeat the address phrase from earlier: 8 points 6 CIT Score: 8 points  Advance Directives (For Healthcare) Does Patient Have a Medical Advance Directive?: Yes Type of Advance Directive: Healthcare Power of [place]; Living will Copy of Healthcare Power of Attorney in Chart?: No - copy requested Copy of Living Will in Chart?: No - copy requested Would patient like information on creating a medical advance directive?: No - Patient declined  Reviewed/Updated  Reviewed/Updated: Reviewed All (Medical, Surgical, Family, Medications, Allergies, Care Teams, Patient Goals)        Objective:    Today's Vitals   09/24/24 1503  Weight: 226 lb (102.5 kg)  Height: 5' 2 (1.575 m)   Body mass index is 41.34  kg/m.  Current Medications (verified) Outpatient Encounter Medications as of 09/24/2024  Medication Sig   ACCU-CHEK SMARTVIEW test strip    acetaminophen (TYLENOL) 500 MG tablet Take 500 mg by mouth every 4 (four) hours as needed. (Patient taking differently: Take 500 mg by mouth 2 (two) times daily.)   albuterol  (VENTOLIN  HFA) 108 (90 Base) MCG/ACT inhaler Inhale 2 puffs into the lungs every 6 (six) hours as needed.   ALOE PO Take 1 capsule by mouth daily.   aspirin 81 MG tablet Take 81 mg by mouth daily.   atorvastatin  (LIPITOR) 20 MG tablet TAKE 1 TABLET BY MOUTH EVERY DAY   B-D ULTRA-FINE 33 LANCETS MISC Use to test blood sugar twice daily dx: E11.9   calcitonin, salmon, (MIACALCIN/FORTICAL)  200 UNIT/ACT nasal spray INSTILL 1 SPRAY NASALLY(ALTERNATING NOSTRILS) EVERY DAY   cetirizine (ZYRTEC) 5 MG tablet Take 10 mg by mouth daily.   COLLAGEN PO Take by mouth daily.   CORAL CALCIUM  PO Take by mouth daily.   CRANBERRY PO Take by mouth daily.   CYANOCOBALAMIN  PO Take by mouth daily.   Fluticasone  Furoate (ARNUITY ELLIPTA ) 100 MCG/ACT AEPB INHALE 1 PUFF INTO THE LUNGS DAILY   furosemide  (LASIX ) 20 MG tablet Take 1 tablet (20 mg total) by mouth 3 (three) times a week. Take one tablet on Mondays, Wednesdays, and Fridays   glucosamine-chondroitin 500-400 MG tablet Take 1 tablet by mouth daily.   hydrALAZINE (APRESOLINE) 10 MG tablet Take 1 tablet (10 mg total) by mouth every 6 (six) hours as needed for up to 14 days (For systolic blood pressure greater than 180 and diastolic blood pressure greater than 105).   hydrocortisone  2.5 % cream Apply topically 3 (three) times daily as needed.   insulin  glargine (LANTUS ) 100 UNIT/ML Solostar Pen Inject 40 Units into the skin at bedtime.   Insulin  Pen Needle (B-D UF III MINI PEN NEEDLES) 31G X 5 MM MISC USE 1 TO 2 TIMES DAILY AS DIRECTED   KRILL OIL PO Take by mouth.   levothyroxine  (SYNTHROID , LEVOTHROID) 100 MCG tablet Take 100 mcg by mouth daily  before breakfast.   MAGNESIUM  PO Take 1 tablet by mouth in the morning and at bedtime.   metoprolol  succinate (TOPROL -XL) 50 MG 24 hr tablet TAKE 1 TABLET(50 MG) BY MOUTH DAILY   montelukast  (SINGULAIR ) 10 MG tablet TAKE 1 TABLET(10 MG) BY MOUTH AT BEDTIME   Multiple Vitamin (MULTIVITAMIN) tablet Take 1 tablet by mouth daily. Centrum Silver   Multiple Vitamins-Minerals (OCUVITE PO) Take 1 tablet by mouth daily.   NOVOLOG FLEXPEN 100 UNIT/ML FlexPen 16 units 1st meal 22 units 2nd meal   omeprazole  (PRILOSEC) 20 MG capsule Take 1 capsule (20 mg total) by mouth 2 (two) times daily before a meal.   Spacer/Aero-Holding Chambers (VALVED HOLDING CHAMBER) DEVI    TURMERIC PO Take 400 mg by mouth daily.   valsartan  (DIOVAN ) 160 MG tablet TAKE 1 TABLET(160 MG) BY MOUTH DAILY   vitamin C (ASCORBIC ACID) 500 MG tablet Take 500 mg by mouth daily.   VITAMIN D PO Take by mouth daily.   No facility-administered encounter medications on file as of 09/24/2024.   Hearing/Vision screen Vision Screening - Comments:: UTD w/Dr Porfilio Immunizations and Health Maintenance Health Maintenance  Topic Date Due   DEXA SCAN  Never done   HEMOGLOBIN A1C  11/01/2022   Mammogram  06/08/2023   FOOT EXAM  09/16/2023   COVID-19 Vaccine (4 - 2025-26 season) 07/15/2024   Medicare Annual Wellness (AWV)  09/17/2024   Zoster Vaccines- Shingrix (1 of 2) 09/19/2025 (Originally 01/28/1950)   OPHTHALMOLOGY EXAM  01/07/2025   DTaP/Tdap/Td (3 - Td or Tdap) 03/31/2031   Pneumococcal Vaccine: 50+ Years  Completed   Influenza Vaccine  Completed   Meningococcal B Vaccine  Aged Out        Assessment/Plan:  This is a routine wellness examination for Zanyia.  Patient Care Team: Bowa, Nahosi K, MD as PCP - General (Family Medicine) Perla Evalene PARAS, MD as PCP - Cardiology (Cardiology) Jaye Fallow, MD as Referring Physician (Ophthalmology)  I have personally reviewed and noted the following in the patient's chart:    Medical and social history Use of alcohol, tobacco or illicit drugs  Current medications and supplements  including opioid prescriptions. Functional ability and status Nutritional status Physical activity Advanced directives List of other physicians Hospitalizations, surgeries, and ER visits in previous 12 months Vitals Screenings to include cognitive, depression, and falls Referrals and appointments  No orders of the defined types were placed in this encounter.  In addition, I have reviewed and discussed with patient certain preventive protocols, quality metrics, and best practice recommendations. A written personalized care plan for preventive services as well as general preventive health recommendations were provided to patient.   Erminio LITTIE Saris, LPN   [id number]   AWV/CPE appt made for 2026 simultaneously  After Visit Summary: (Declined) Due to this being a telephonic visit, with patients personalized plan was offered to patient but patient Declined AVS at this time   Nurse Notes: Pt says she needs something to help her get to sleep at night

## 2024-09-24 NOTE — Patient Instructions (Signed)
 Ms. Spaid,  Thank you for taking the time for your Medicare Wellness Visit. I appreciate your continued commitment to your health goals. Please review the care plan we discussed, and feel free to reach out if I can assist you further.  Please note that Annual Wellness Visits do not include a physical exam. Some assessments may be limited, especially if the visit was conducted virtually. If needed, we may recommend an in-person follow-up with your provider.  Ongoing Care Seeing your primary care provider every 3 to 6 months helps us  monitor your health and provide consistent, personalized care.   Referrals If a referral was made during today's visit and you haven't received any updates within two weeks, please contact the referred provider directly to check on the status.  Recommended Screenings:  Health Maintenance  Topic Date Due   DEXA scan (bone density measurement)  Never done   Hemoglobin A1C  11/01/2022   Breast Cancer Screening  06/08/2023   Complete foot exam   09/16/2023   COVID-19 Vaccine (4 - 2025-26 season) 07/15/2024   Medicare Annual Wellness Visit  09/17/2024   Zoster (Shingles) Vaccine (1 of 2) 09/19/2025*   Eye exam for diabetics  01/07/2025   DTaP/Tdap/Td vaccine (3 - Td or Tdap) 03/31/2031   Pneumococcal Vaccine for age over 27  Completed   Flu Shot  Completed   Meningitis B Vaccine  Aged Out  *Topic was postponed. The date shown is not the original due date.       09/24/2024    3:18 PM  Advanced Directives  Does Patient Have a Medical Advance Directive? Yes  Type of Estate Agent of [place];Living will  Copy of Healthcare Power of Attorney in Chart? No - copy requested    Vision: Annual vision screenings are recommended for early detection of glaucoma, cataracts, and diabetic retinopathy. These exams can also reveal signs of chronic conditions such as diabetes and high blood pressure.  Dental: Annual dental screenings help detect  early signs of oral cancer, gum disease, and other conditions linked to overall health, including heart disease and diabetes.

## 2024-09-27 ENCOUNTER — Other Ambulatory Visit (INDEPENDENT_AMBULATORY_CARE_PROVIDER_SITE_OTHER)

## 2024-09-27 DIAGNOSIS — E875 Hyperkalemia: Secondary | ICD-10-CM

## 2024-09-27 DIAGNOSIS — E559 Vitamin D deficiency, unspecified: Secondary | ICD-10-CM | POA: Diagnosis not present

## 2024-09-27 DIAGNOSIS — Z136 Encounter for screening for cardiovascular disorders: Secondary | ICD-10-CM

## 2024-09-27 NOTE — Addendum Note (Signed)
 Addended by: ISADORA RAISIN on: 09/27/2024 04:01 PM   Modules accepted: Orders

## 2024-09-27 NOTE — Addendum Note (Signed)
 Addended by: ISADORA RAISIN on: 09/27/2024 04:04 PM   Modules accepted: Orders

## 2024-10-01 NOTE — Progress Notes (Signed)
 Visit done via telephone with patient (09/24/24)

## 2024-10-02 LAB — LIPID PANEL
Cholesterol: 115 mg/dL (ref <200)
HDL: 54 mg/dL (ref >=50)
LDL Cholesterol (Calc): 37 mg/dL
Non-HDL Cholesterol (Calc): 61 mg/dL (ref <130)
Total CHOL/HDL Ratio: 2.1 (calc) (ref <5.0)
Triglycerides: 166 mg/dL — ABNORMAL HIGH (ref <150)

## 2024-10-02 LAB — VITAMIN D 25 HYDROXY (VIT D DEFICIENCY, FRACTURES): Vit D, 25-Hydroxy: 54 ng/mL (ref 30–100)

## 2024-10-02 LAB — POTASSIUM: Potassium: 5.3 mmol/L (ref 3.5–5.3)

## 2024-10-04 ENCOUNTER — Ambulatory Visit: Admitting: Physician Assistant

## 2024-10-04 NOTE — Progress Notes (Deleted)
 Cardiology Office Note    Date:  10/04/2024   ID:  MINAL STULLER, DOB 08/08/1931, MRN [id number]  PCP:  Bennett Reuben POUR, MD  Cardiologist:  Evalene Lunger, MD  Electrophysiologist:  None   Chief Complaint: Follow up  History of Present Illness:   [name] is a 89 y.o. female with history of paroxysmal atrial fibrillation, diastolic dysfunction, type 2 diabetes, hypertension, hyperlipidemia, hypothyroidism, and GERD who presents for follow-up on diastolic dysfunction and lower extremity swelling.     Patient was originally diagnosed with atrial fibrillation in the 1990s.  She wore a heart monitor in 06/2016 which showed no evidence of atrial fibrillation.  Echo 06/2016 demonstrated EF 60 to 65%, no RWMA, G1 DD, mild MR.  She was not maintained on anticoagulation Presumed isolated episode in the late 1990s.  She was admitted to the hospital 03/2022 with hyponatremia.  This was treated with DD AVP and D5 bolus.  Seen for hospital follow-up 04/2022 overall doing well although she had ongoing diarrhea for the past 6 months.  Her weight was down 11 pounds.  Losartan  was transition to amlodipine  given high normal potassium. Patient was seen in follow up 09/2022 after recently losing her husband. She reported mild shortness of breath. Her daughter was worried about possible amyloid disease given bilateral carpal tunnel. Echo showed EF 60 to 65% with mild LVH, G1 DD, and no significant valvular abnormalities.  Patient was seen 08/2023 for worsening lower extremity edema and abdominal swelling.  Weight was noted to be up about 14 pounds since prior visit.  Blood pressure was also noted to be elevated.  Amlodipine  was discontinued and she was started on valsartan  in addition to Lasix  20 mg 3 times per week with an additional dose as needed for weight gain.  Patient was seen in the emergency room 08/21/2024 with left facial numbness. Blood pressure was 230/103 on presentation. EKG without acute  ischemic changes. Labs overall reassuring. CT head code stroke without contrast was negative for acute abnormalities. CT angio head code stroke was without LVO. Neurology was consulted and ordered MRI which was negative. They recommended treating for hypertensive urgency. She was given hydralazine  with downtrending blood pressures. Left facial tingling resolved and she was discharged with instruction to follow-up with cardiology.   Patient was most recently seen by myself 08/23/2024 for ED follow-up.  She reported doing well since discharge from the ED with blood pressure well-controlled.  She has not needed to take any of the hydralazine  that was prescribed in the ED.  Denied recurrence of facial numbness.  She did endorse 1 to 2 weeks of lower extremity swelling.  She had stopped taking her Lasix  on a regular basis several months ago because her lower extremity swelling had improved.  She had resumed taking Lasix  20 mg daily for about 4 to 5 days with a 3 to 4 pound weight loss but then stopped taking it as she was concerned about doing this without consulting a provider.  Her weight was noted to be up about 4 pounds since her previous visit in April.  She reported a steady weight gain since losing her husband 2 years prior.  She had been trying to lose weight by eating healthier and being more physically active.  ***  Labs independently reviewed: 09/2024-TC 115, HDL 54, TG 166, LDL 37, potassium 5.4, sodium 135, BUN 25, creatinine 0.88 08/2024-normal LFTs 04/2024-A1c 7.2  Objective   Past Medical History:  Diagnosis Date  Allergy    Asthma    Diabetes mellitus    Diastolic dysfunction    a. 06/2019 Echo: EF 60-65%, no rwma, GR1 DD. Mild MR. Nl RV fxn.   Essential hypertension    GERD (gastroesophageal reflux disease)    Hyperlipidemia    Mixed hyperlipidemia    Osteopenia    PAF (paroxysmal atrial fibrillation) (HCC)    a. Dx in late 1990's - no known recurrence.   Sleep disorder     Thyroid  disease     Current Medications: No outpatient medications have been marked as taking for the 10/04/24 encounter (Appointment) with Lorene Lesley CROME, PA-C.    Allergies:   Gabapentin , Cefdinir, Ciprofloxacin, Clarithromycin, Diclofenac, Miglitol, Niacin, Qvar  [beclomethasone], Jardiance [empagliflozin], and Metformin and related   Social History   Socioeconomic History   Marital status: Widowed    Spouse name: Not on file   Number of children: 2   Years of education: Not on file   Highest education level: Not on file  Occupational History    Employer: RETIRED  Tobacco Use   Smoking status: Never    Passive exposure: Never   Smokeless tobacco: Never  Vaping Use   Vaping status: Never Used  Substance and Sexual Activity   Alcohol use: Not Currently    Alcohol/week: 1.0 standard drink of alcohol    Types: 1 Glasses of wine per week    Comment: 1 glass of wine-rare   Drug use: No   Sexual activity: Not Currently  Other Topics Concern   Not on file  Social History Narrative   Widowed 3/23      Has living will   Not sure about formal POA --but requests daughter [name] (and son Marty) to be health care POAs   Would accept resuscitation attempts   no feeding tubes if cognitively unaware   Social Drivers of Health   Financial Resource Strain: Low Risk  (09/24/2024)   Overall Financial Resource Strain (CARDIA)    Difficulty of Paying Living Expenses: Not hard at all  Food Insecurity: No Food Insecurity (09/24/2024)   Hunger Vital Sign    Worried About Running Out of Food in the Last Year: Never true    Ran Out of Food in the Last Year: Never true  Transportation Needs: No Transportation Needs (09/24/2024)   PRAPARE - Administrator, Civil Service (Medical): No    Lack of Transportation (Non-Medical): No  Physical Activity: Inactive (09/24/2024)   Exercise Vital Sign    Days of Exercise per Week: 0 days    Minutes of Exercise per Session: 0 min   Stress: No Stress Concern Present (09/24/2024)   [employer] of Occupational Health - Occupational Stress Questionnaire    Feeling of Stress: Only a little  Social Connections: Moderately Isolated (09/24/2024)   Social Connection and Isolation Panel    Frequency of Communication with Friends and Family: Twice a week    Frequency of Social Gatherings with Friends and Family: Twice a week    Attends Religious Services: 1 to 4 times per year    Active Member of [employer] or Organizations: No    Attends Banker Meetings: Never    Marital Status: Widowed     Family History:  The patient's family history includes Asthma in her mother; Coronary artery disease in her father.  ROS:   12-point review of systems is negative unless otherwise noted in the HPI.  EKGs/Other Studies Reviewed:    Studies  reviewed were summarized above. The additional studies were reviewed today:  11/2022 Echo complete 1. Left ventricular ejection fraction, by estimation, is 60 to 65%. The left ventricle has normal function. The left ventricle has no regional wall motion abnormalities. There is mild left ventricular hypertrophy. Left ventricular diastolic parameters are consistent with Grade I diastolic dysfunction (impaired relaxation).   2. Right ventricular systolic function is normal. The right ventricular size is normal. Tricuspid regurgitation signal is inadequate for assessing  PA pressure.   3. The mitral valve is normal in structure. No evidence of mitral valve regurgitation. No evidence of mitral stenosis.   4. The aortic valve is normal in structure. Aortic valve regurgitation is not visualized. No aortic stenosis is present.   5. The inferior vena cava is normal in size with greater than 50% respiratory variability, suggesting right atrial pressure of 3 mmHg.   EKG:  EKG personally reviewed by me today    PHYSICAL EXAM:    VS:  There were no vitals taken for this visit.  BMI: There is no  height or weight on file to calculate BMI.  GEN: Well nourished, well developed in no acute distress NECK: No JVD; No carotid bruits CARDIAC: ***RRR, no murmurs, rubs, gallops RESPIRATORY:  Clear to auscultation without rales, wheezing or rhonchi  ABDOMEN: Soft, non-tender, non-distended EXTREMITIES:  *** No edema; No deformity  Wt Readings from Last 3 Encounters:  09/24/24 226 lb (102.5 kg)  09/19/24 226 lb (102.5 kg)  08/23/24 223 lb (101.2 kg)                  ASSESSMENT & PLAN:   Diastolic dysfunction Lower extremity swelling   Paroxysmal atrial fibrillation   Hypertension   Hyperlipidemia Aortic atherosclerosis   {Are you ordering a CV Procedure (e.g. stress test, cath, DCCV, TEE, etc)?   Press F2        :[id number]}   Disposition: F/u with Dr. PIERRETTE or an APP in ***.   Medication Adjustments/Labs and Tests Ordered: Current medicines are reviewed at length with the patient today.  Concerns regarding medicines are outlined above. Medication changes, Labs and Tests ordered today are summarized above and listed in the Patient Instructions accessible in Encounters.   Bonney Lesley Maffucci, PA-C 10/04/2024 1:02 PM     [facility] - [place] [address] [place], [place] [address] [phone]

## 2024-10-17 ENCOUNTER — Encounter: Payer: Self-pay | Admitting: Medical

## 2024-10-17 ENCOUNTER — Ambulatory Visit: Attending: Medical | Admitting: Medical

## 2024-10-17 VITALS — BP 156/80 | HR 73 | Ht 60.0 in | Wt 226.8 lb

## 2024-10-17 DIAGNOSIS — I5032 Chronic diastolic (congestive) heart failure: Secondary | ICD-10-CM | POA: Diagnosis not present

## 2024-10-17 DIAGNOSIS — E782 Mixed hyperlipidemia: Secondary | ICD-10-CM

## 2024-10-17 DIAGNOSIS — I10 Essential (primary) hypertension: Secondary | ICD-10-CM | POA: Diagnosis not present

## 2024-10-17 DIAGNOSIS — I700 Atherosclerosis of aorta: Secondary | ICD-10-CM

## 2024-10-17 DIAGNOSIS — I480 Paroxysmal atrial fibrillation: Secondary | ICD-10-CM | POA: Diagnosis not present

## 2024-10-17 DIAGNOSIS — Z79899 Other long term (current) drug therapy: Secondary | ICD-10-CM

## 2024-10-17 MED ORDER — FUROSEMIDE 20 MG PO TABS: 20.0000 mg | ORAL_TABLET | ORAL | 3 refills | Status: AC

## 2024-10-17 NOTE — Patient Instructions (Signed)
 Medication Instructions:  Your physician recommends the following medication changes.  START TAKING: Lasix  20 mg every other day  *If you need a refill on your cardiac medications before your next appointment, please call your pharmacy*  Lab Work: Your provider would like for you to return in 2 weeks to have the following labs drawn: BMeT.   Please go to [facility] [address] ([facility] [address], [place] [address] You do not need an appointment.  They are open from 8 am- 4:30 pm.  Lunch from 1:00 pm- 2:00 pm You DO NOT need to be fasting.   You may also go to one of the following [facility]:  [address] [place], [place] [address] Phone: [phone] Lab hours: Mon-Fri 8 am- 5 pm    Lunch 12 pm- 1 pm  [address] [place],  [place]  [address]  [place]  Phone: [phone] Lab hours: 7 am- 4 pm Lunch 12 pm-1 pm   [address] [place],  [place]  [address]  [place]  Phone: [phone] Lab hours: Mon-Fri 8 am- 5 pm    Lunch 12 pm- 1 pm  If you have labs (blood work) drawn today and your tests are completely normal, you will receive your results only by: MyChart Message (if you have MyChart) OR A paper copy in the mail If you have any lab test that is abnormal or we need to change your treatment, we will call you to review the results.  Follow-Up: At [facility], you and your health needs are our priority.  As part of our continuing mission to provide you with exceptional heart care, our providers are all part of one team.  This team includes your primary Cardiologist (physician) and Advanced Practice Providers or APPs (Physician Assistants and Nurse Practitioners) who all work together to provide you with the care you need, when you need it.  Your next appointment:   3 month(s)  Provider:   You may see Timothy Gollan, MD or one of the following Advanced Practice Providers on your designated Care Team:   Lonni Meager, NP Lesley Maffucci,  PA-C Bernardino Bring, PA-C Cadence Burbank, PA-C Tylene Lunch, NP Barnie Hila, NP  We recommend signing up for the patient portal called MyChart.  Sign up information is provided on this After Visit Summary.  MyChart is used to connect with patients for Virtual Visits (Telemedicine).  Patients are able to view lab/test results, encounter notes, upcoming appointments, etc.  Non-urgent messages can be sent to your provider as well.   To learn more about what you can do with MyChart, go to [email or web address].

## 2024-10-17 NOTE — Progress Notes (Signed)
 Cardiology Office Note   Date:  10/18/2024  ID:  [name], [name] 08-08-1931, MRN [id number] PCP: Bennett Reuben POUR, MD  [facility] Providers Cardiologist:  Evalene Lunger, MD   History of Present Illness [name] is a 89 y.o. female with a h/o paroxysmal A-fib, diastolic dysfunction, type 2 diabetes, hypertension, hyperlipidemia, hypothyroidism, GERD who presents for follow-up of CHF.  The patient was originally diagnosed with A-fib in the 1990s.  She wore a heart monitor in 06/2016 which showed no evidence of A-fib.  Echo in 06/2016 showed EF of 60 to 65%, no wall motion abnormality, grade 1 diastolic dysfunction, mild MR.  She was not maintained on anticoagulation presumed isolated episode in the late 1990s.  She was admitted to the hospital 03/2022 with hyponatremia treated with DDAVP and D5 bolus.  Patient was seen in follow-up 09/2022 reporting mild shortness of breath.  Echocardiogram showed EF of 60 to 65%, mild LVH, grade 1 diastolic dysfunction, no significant valvular abnormalities.  Patient was seen in follow-up reporting a 14 pound weight gain.  Amlodipine  was discontinued, she was started on valsartan  and Lasix  20 mg 3 times a week.  Patient was seen in the ER 08/21/2024 with left facial numbness found have a blood pressure of 230/103.  CT head was negative for acute abnormalities.  CTA head was without LVO.  Neurology ordered MRI which was negative.  They recommended treating for hypertensive urgency and she was given hydralazine  with improvement of pressures.  She was seen in follow-up 08/23/2024 and was doing well from a cardiac perspective.  Blood pressure was well-controlled.  Today, the patient took lasix  for 2 weeks. Potassium was found to go up. Since then weight has gone up to 226lbs. Weight cane be down to 219lb. She denies chest pain or SOB. They try to keep feet up more since she has been sitting.   Studies Reviewed      Echo 11/2022 1. Left ventricular  ejection fraction, by estimation, is 60 to 65%. The  left ventricle has normal function. The left ventricle has no regional  wall motion abnormalities. There is mild left ventricular hypertrophy.  Left ventricular diastolic parameters  are consistent with Grade I diastolic dysfunction (impaired relaxation).   2. Right ventricular systolic function is normal. The right ventricular  size is normal. Tricuspid regurgitation signal is inadequate for assessing  PA pressure.   3. The mitral valve is normal in structure. No evidence of mitral valve  regurgitation. No evidence of mitral stenosis.   4. The aortic valve is normal in structure. Aortic valve regurgitation is  not visualized. No aortic stenosis is present.   5. The inferior vena cava is normal in size with greater than 50%  respiratory variability, suggesting right atrial pressure of 3 mmHg.   Comparison(s): Echo 06/2016: Normal EF 60-65%, normal LA size.      Physical Exam VS:  BP (!) 156/80 (BP Location: Left Arm, Patient Position: Sitting, Cuff Size: Large)   Pulse 73   Ht 5' (1.524 m)   Wt 226 lb 12.8 oz (102.9 kg)   SpO2 95%   BMI 44.29 kg/m        Wt Readings from Last 3 Encounters:  10/17/24 226 lb 12.8 oz (102.9 kg)  09/24/24 226 lb (102.5 kg)  09/19/24 226 lb (102.5 kg)    GEN: Well nourished, well developed in no acute distress NECK: No JVD; No carotid bruits CARDIAC: RRR, no murmurs, rubs, gallops RESPIRATORY:  Clear to auscultation without rales, wheezing or rhonchi  ABDOMEN: Soft, non-tender, non-distended EXTREMITIES:  No edema; No deformity   ASSESSMENT AND PLAN  HFpEF Patient took lasix  M,W,F x 2 weeks and felt volume status stayed the same. Since then, she stopped lasix  and K was found to be elevated.  Weight has gone from 223 lbs to 226lbs.  Lowest weight was 219 pounds.  I recommend Lasix  20 mg every other day.  BMP in 2 weeks.  Paroxysmal Afib She has h/o remote isolated episode in the 1990s. No  symptoms concerning for recurrence. She has not been on anticogulation due to presumed isolated episode.  Continue metoprolol  succinate 50 mg daily.  HTN Blood pressure is mildly elevated, may be due to extra volume.  We will continue metoprolol  and valsartan .  HLD Aortic atherosclerosis LDL 51, which is at goal.  Continue Lipitor 20 mg daily.      Dispo: Follow-up in 3 months  Signed, Abdirizak Richison VEAR Fishman, PA-C

## 2024-10-31 ENCOUNTER — Other Ambulatory Visit: Payer: Self-pay | Admitting: Gastroenterology

## 2024-10-31 DIAGNOSIS — K219 Gastro-esophageal reflux disease without esophagitis: Secondary | ICD-10-CM

## 2024-10-31 DIAGNOSIS — R1013 Epigastric pain: Secondary | ICD-10-CM

## 2024-10-31 DIAGNOSIS — R110 Nausea: Secondary | ICD-10-CM

## 2024-11-04 ENCOUNTER — Other Ambulatory Visit (INDEPENDENT_AMBULATORY_CARE_PROVIDER_SITE_OTHER)

## 2024-11-04 DIAGNOSIS — E559 Vitamin D deficiency, unspecified: Secondary | ICD-10-CM

## 2024-11-04 DIAGNOSIS — Z136 Encounter for screening for cardiovascular disorders: Secondary | ICD-10-CM

## 2024-11-05 LAB — LIPID PANEL
Cholesterol: 117 mg/dL (ref 28–200)
HDL: 56.2 mg/dL
LDL Cholesterol: 41 mg/dL (ref 10–99)
NonHDL: 60.94
Total CHOL/HDL Ratio: 2
Triglycerides: 102 mg/dL (ref 10.0–149.0)
VLDL: 20.4 mg/dL (ref 0.0–40.0)

## 2024-11-05 LAB — VITAMIN D 25 HYDROXY (VIT D DEFICIENCY, FRACTURES): VITD: 40.48 ng/mL (ref 30.00–100.00)

## 2024-11-11 ENCOUNTER — Ambulatory Visit: Payer: Self-pay

## 2024-11-11 ENCOUNTER — Encounter

## 2024-11-12 ENCOUNTER — Ambulatory Visit
Admission: RE | Admit: 2024-11-12 | Discharge: 2024-11-12 | Disposition: A | Discharge status: Home / Self Care | Source: Ambulatory Visit | Attending: Gastroenterology | Admitting: Gastroenterology

## 2024-11-12 DIAGNOSIS — R1013 Epigastric pain: Secondary | ICD-10-CM | POA: Diagnosis present

## 2024-11-12 DIAGNOSIS — K219 Gastro-esophageal reflux disease without esophagitis: Secondary | ICD-10-CM | POA: Diagnosis present

## 2024-11-12 DIAGNOSIS — R110 Nausea: Secondary | ICD-10-CM | POA: Diagnosis present

## 2024-11-12 MED ORDER — IOHEXOL 300 MG/ML  SOLN
100.0000 mL | Freq: Once | INTRAMUSCULAR | Status: AC | PRN
  Administered 2024-11-12: 100 mL via INTRAVENOUS

## 2024-12-05 ENCOUNTER — Encounter: Payer: Self-pay | Admitting: Family Medicine

## 2024-12-05 ENCOUNTER — Ambulatory Visit: Admitting: Family Medicine

## 2024-12-05 VITALS — BP 126/70 | HR 65 | Temp 97.8°F | Ht 60.0 in | Wt 226.4 lb

## 2024-12-05 DIAGNOSIS — K0889 Other specified disorders of teeth and supporting structures: Secondary | ICD-10-CM | POA: Insufficient documentation

## 2024-12-05 MED ORDER — CHLORHEXIDINE GLUCONATE 0.12 % MT SOLN: 15.0000 mL | Freq: Two times a day (BID) | OROMUCOSAL | 0 refills | Status: AC

## 2024-12-05 MED ORDER — AMOXICILLIN 500 MG PO CAPS: 1000.0000 mg | ORAL_CAPSULE | Freq: Two times a day (BID) | ORAL | 0 refills | Status: AC

## 2024-12-05 NOTE — Progress Notes (Signed)
 "   Patient ID: [name], female    DOB: 08/03/1931, 89 y.o.   MRN: [id number]  This visit was conducted in person.  BP 126/70 (BP Location: Left Arm, Patient Position: Sitting, Cuff Size: Large)   Pulse 65   Temp 97.8 F (36.6 C) (Oral)   Ht 5' (1.524 m)   Wt 226 lb 6.4 oz (102.7 kg)   SpO2 95%   BMI 44.22 kg/m    CC:  Chief Complaint  Patient presents with   Acute Visit    Pain stemming from lower molar on left side of face since last week,  has appt date with dentist but starting this morning the pain started radiating to pt's left ear and down to throat States left ear to collarbone is sensitive to touch, Pt's Daughter, [name] is in room and is worried about possible infection    Subjective:   HPI: [name] is a 89 y.o. female presenting on 12/05/2024 for Acute Visit (Pain stemming from lower molar on left side of face since last week,  has appt date with dentist but starting this morning the pain started radiating to pt's left ear and down to throat/States left ear to collarbone is sensitive to touch,/Pt's Daughter, [name] is in room and is worried about possible infection)  Daughter [name] helps with history of illness.   New onset pain in lower molar on left 1 week ago... pain in gum. This AM pain radiating to left ear, and to throat.   No fever.  She is feeling fatigued.  No SOB, no wheeze.  No trouble swallowing.  Using  tylenol.  Dentist appt scheduled next week.  History of hypothyroidism, paroxysmal atrial fibrillation, diabetes and asthma. Non-smoker PCP: [facility]     Now on omeprazole  40 mg daily for recent gastritis.. has helped.  Relevant past medical, surgical, family and social history reviewed and updated as indicated. Interim medical history since our last visit reviewed. Allergies and medications reviewed and updated. Outpatient Medications Prior to Visit  Medication Sig Dispense Refill   ACCU-CHEK SMARTVIEW test strip   3    acetaminophen (TYLENOL) 500 MG tablet Take 500 mg by mouth every 4 (four) hours as needed. (Patient taking differently: Take 500 mg by mouth 2 (two) times daily.)     albuterol  (VENTOLIN  HFA) 108 (90 Base) MCG/ACT inhaler Inhale 2 puffs into the lungs every 6 (six) hours as needed. 18 g 1   ALOE PO Take 1 capsule by mouth daily.     aspirin 81 MG tablet Take 81 mg by mouth daily.     atorvastatin  (LIPITOR) 20 MG tablet TAKE 1 TABLET BY MOUTH EVERY DAY 90 tablet 3   B-D ULTRA-FINE 33 LANCETS MISC Use to test blood sugar twice daily dx: E11.9 200 each 3   calcitonin, salmon, (MIACALCIN/FORTICAL) 200 UNIT/ACT nasal spray INSTILL 1 SPRAY NASALLY(ALTERNATING NOSTRILS) EVERY DAY 3.7 mL 11   cetirizine (ZYRTEC) 5 MG tablet Take 10 mg by mouth daily.     COLLAGEN PO Take by mouth daily.     CORAL CALCIUM  PO Take by mouth daily.     CRANBERRY PO Take by mouth daily.     CYANOCOBALAMIN  PO Take by mouth daily.     fluticasone  (FLONASE ) 50 MCG/ACT nasal spray Place 1 spray into both nostrils daily.     Fluticasone  Furoate (ARNUITY ELLIPTA ) 100 MCG/ACT AEPB INHALE 1 PUFF INTO THE LUNGS DAILY 30 each 11   furosemide  (LASIX ) 20 MG tablet  Take 1 tablet (20 mg total) by mouth every other day. 45 tablet 3   glucosamine-chondroitin 500-400 MG tablet Take 1 tablet by mouth daily.     hydrocortisone  2.5 % cream Apply topically 3 (three) times daily as needed. 28 g 3   insulin  glargine (LANTUS ) 100 UNIT/ML Solostar Pen Inject 40 Units into the skin at bedtime.     Insulin  Pen Needle (B-D UF III MINI PEN NEEDLES) 31G X 5 MM MISC USE 1 TO 2 TIMES DAILY AS DIRECTED 200 each 4   KRILL OIL PO Take by mouth.     levothyroxine  (SYNTHROID , LEVOTHROID) 100 MCG tablet Take 100 mcg by mouth daily before breakfast.     MAGNESIUM  PO Take 1 tablet by mouth in the morning and at bedtime.     metoprolol  succinate (TOPROL -XL) 50 MG 24 hr tablet TAKE 1 TABLET(50 MG) BY MOUTH DAILY 90 tablet 3   montelukast  (SINGULAIR ) 10 MG tablet  TAKE 1 TABLET(10 MG) BY MOUTH AT BEDTIME 90 tablet 3   Multiple Vitamin (MULTIVITAMIN) tablet Take 1 tablet by mouth daily. Centrum Silver     Multiple Vitamins-Minerals (OCUVITE PO) Take 1 tablet by mouth daily.     NOVOLOG FLEXPEN 100 UNIT/ML FlexPen 16 units 1st meal 22 units 2nd meal     omeprazole  (PRILOSEC) 20 MG capsule Take 1 capsule (20 mg total) by mouth 2 (two) times daily before a meal. 180 capsule 0   Spacer/Aero-Holding Chambers (VALVED HOLDING CHAMBER) DEVI      TURMERIC PO Take 400 mg by mouth daily.     valsartan  (DIOVAN ) 160 MG tablet TAKE 1 TABLET(160 MG) BY MOUTH DAILY 90 tablet 3   vitamin C (ASCORBIC ACID) 500 MG tablet Take 500 mg by mouth daily.     VITAMIN D  PO Take by mouth daily.     hydrALAZINE  (APRESOLINE ) 10 MG tablet Take 1 tablet (10 mg total) by mouth every 6 (six) hours as needed for up to 14 days (For systolic blood pressure greater than 180 and diastolic blood pressure greater than 105). (Patient not taking: Reported on 12/05/2024) 30 tablet 0   No facility-administered medications prior to visit.     Per HPI unless specifically indicated in ROS section below Review of Systems  Constitutional:  Positive for fatigue. Negative for fever.  HENT:  Positive for dental problem and ear pain. Negative for congestion.   Eyes:  Negative for pain.  Respiratory:  Negative for cough and shortness of breath.   Cardiovascular:  Negative for chest pain, palpitations and leg swelling.  Gastrointestinal:  Negative for abdominal pain.  Genitourinary:  Negative for dysuria and vaginal bleeding.  Musculoskeletal:  Negative for back pain.  Neurological:  Negative for syncope, light-headedness and headaches.  Psychiatric/Behavioral:  Negative for dysphoric mood.    Objective:  BP 126/70 (BP Location: Left Arm, Patient Position: Sitting, Cuff Size: Large)   Pulse 65   Temp 97.8 F (36.6 C) (Oral)   Ht 5' (1.524 m)   Wt 226 lb 6.4 oz (102.7 kg)   SpO2 95%   BMI 44.22  kg/m   Wt Readings from Last 3 Encounters:  12/05/24 226 lb 6.4 oz (102.7 kg)  10/17/24 226 lb 12.8 oz (102.9 kg)  09/24/24 226 lb (102.5 kg)      Physical Exam Constitutional:      General: She is not in acute distress.    Appearance: Normal appearance. She is well-developed. She is not ill-appearing or toxic-appearing.  HENT:  Head: Normocephalic.     Right Ear: Hearing, tympanic membrane, ear canal and external ear normal. Tympanic membrane is not erythematous, retracted or bulging.     Left Ear: Hearing, tympanic membrane, ear canal and external ear normal. Tympanic membrane is not erythematous, retracted or bulging.     Nose: No mucosal edema or rhinorrhea.     Right Sinus: No maxillary sinus tenderness or frontal sinus tenderness.     Left Sinus: No maxillary sinus tenderness or frontal sinus tenderness.     Mouth/Throat:     Dentition: Abnormal dentition. Dental tenderness, gingival swelling and dental caries present.     Pharynx: Uvula midline.      Comments: Erythema redness and tenderness without fluctuance in left lower gumline Eyes:     General: Lids are normal. Lids are everted, no foreign bodies appreciated.     Conjunctiva/sclera: Conjunctivae normal.     Pupils: Pupils are equal, round, and reactive to light.  Neck:     Thyroid : No thyroid  mass or thyromegaly.     Vascular: No carotid bruit.     Trachea: Trachea normal.  Cardiovascular:     Rate and Rhythm: Normal rate and regular rhythm.     Pulses: Normal pulses.     Heart sounds: Normal heart sounds, S1 normal and S2 normal. No murmur heard.    No friction rub. No gallop.  Pulmonary:     Effort: Pulmonary effort is normal. No tachypnea or respiratory distress.     Breath sounds: Normal breath sounds. No decreased breath sounds, wheezing, rhonchi or rales.  Abdominal:     General: Bowel sounds are normal.     Palpations: Abdomen is soft.     Tenderness: There is no abdominal tenderness.   Musculoskeletal:     Cervical back: Normal range of motion and neck supple.  Skin:    General: Skin is warm and dry.     Findings: No rash.  Neurological:     Mental Status: She is alert.  Psychiatric:        Mood and Affect: Mood is not anxious or depressed.        Speech: Speech normal.        Behavior: Behavior normal. Behavior is cooperative.        Thought Content: Thought content normal.        Judgment: Judgment normal.       Results for orders placed or performed in visit on 11/04/24  Lipid Panel   Collection Time: 11/04/24  1:22 PM  Result Value Ref Range   Cholesterol 117 28 - 200 mg/dL   Triglycerides 897.9 89.9 - 149.0 mg/dL   HDL 43.79 >60.99 mg/dL   VLDL 79.5 0.0 - 59.9 mg/dL   LDL Cholesterol 41 10 - 99 mg/dL   Total CHOL/HDL Ratio 2    NonHDL 60.94   VITAMIN D  25 Hydroxy (Vit-D Deficiency, Fractures)   Collection Time: 11/04/24  1:22 PM  Result Value Ref Range   VITD 40.48 30.00 - 100.00 ng/mL    Assessment and Plan  Pain, dental Assessment & Plan: Acute, concern for possible gum infection or dental infection.  No clear sign of dental abscess at this point. Will start with Peridex  swish twice daily and treat with amoxicillin  500 mg 2 tablets twice a day for 7 to 10 days. Recommended keeping appointment next week with dentist as planned if able.  Return and ER precautions provided   Other orders -  Amoxicillin ; Take 2 capsules (1,000 mg total) by mouth 2 (two) times daily.  Dispense: 40 capsule; Refill: 0 -     Chlorhexidine  Gluconate; Use as directed 15 mLs in the mouth or throat 2 (two) times daily.  Dispense: 120 mL; Refill: 0    Return if symptoms worsen or fail to improve.   Greig Ring, MD  "

## 2024-12-05 NOTE — Assessment & Plan Note (Signed)
 Acute, concern for possible gum infection or dental infection.  No clear sign of dental abscess at this point. Will start with Peridex  swish twice daily and treat with amoxicillin  500 mg 2 tablets twice a day for 7 to 10 days. Recommended keeping appointment next week with dentist as planned if able.  Return and ER precautions provided

## 2024-12-26 ENCOUNTER — Encounter

## 2025-01-20 ENCOUNTER — Ambulatory Visit: Admitting: Cardiovascular Disease

## 2025-11-12 ENCOUNTER — Ambulatory Visit

## 2025-11-12 ENCOUNTER — Encounter
